# Patient Record
Sex: Female | Born: 1940 | Race: White | Hispanic: No | Marital: Married | Smoking: Never smoker
Health system: Southern US, Community
[De-identification: ages and names within clinical notes are randomized; demographics above are authoritative.]

## PROBLEM LIST (undated history)

## (undated) DIAGNOSIS — Z9889 Other specified postprocedural states: Secondary | ICD-10-CM

## (undated) DIAGNOSIS — I2510 Atherosclerotic heart disease of native coronary artery without angina pectoris: Secondary | ICD-10-CM

## (undated) DIAGNOSIS — I10 Essential (primary) hypertension: Secondary | ICD-10-CM

## (undated) DIAGNOSIS — C801 Malignant (primary) neoplasm, unspecified: Secondary | ICD-10-CM

## (undated) DIAGNOSIS — L280 Lichen simplex chronicus: Secondary | ICD-10-CM

## (undated) DIAGNOSIS — R05 Cough: Secondary | ICD-10-CM

## (undated) DIAGNOSIS — H269 Unspecified cataract: Secondary | ICD-10-CM

## (undated) DIAGNOSIS — K589 Irritable bowel syndrome without diarrhea: Secondary | ICD-10-CM

## (undated) DIAGNOSIS — R011 Cardiac murmur, unspecified: Secondary | ICD-10-CM

## (undated) DIAGNOSIS — R112 Nausea with vomiting, unspecified: Secondary | ICD-10-CM

## (undated) DIAGNOSIS — J301 Allergic rhinitis due to pollen: Secondary | ICD-10-CM

## (undated) DIAGNOSIS — G4730 Sleep apnea, unspecified: Secondary | ICD-10-CM

## (undated) DIAGNOSIS — T8859XA Other complications of anesthesia, initial encounter: Secondary | ICD-10-CM

## (undated) DIAGNOSIS — K219 Gastro-esophageal reflux disease without esophagitis: Secondary | ICD-10-CM

## (undated) DIAGNOSIS — T7840XA Allergy, unspecified, initial encounter: Secondary | ICD-10-CM

## (undated) DIAGNOSIS — F419 Anxiety disorder, unspecified: Secondary | ICD-10-CM

## (undated) DIAGNOSIS — T4145XA Adverse effect of unspecified anesthetic, initial encounter: Secondary | ICD-10-CM

## (undated) DIAGNOSIS — R059 Cough, unspecified: Secondary | ICD-10-CM

## (undated) HISTORY — DX: Allergic rhinitis due to pollen: J30.1

## (undated) HISTORY — DX: Cough, unspecified: R05.9

## (undated) HISTORY — PX: TONSILLECTOMY: SUR1361

## (undated) HISTORY — DX: Sleep apnea, unspecified: G47.30

## (undated) HISTORY — DX: Gastro-esophageal reflux disease without esophagitis: K21.9

## (undated) HISTORY — PX: BLADDER SUSPENSION: SHX72

## (undated) HISTORY — DX: Anxiety disorder, unspecified: F41.9

## (undated) HISTORY — PX: CATARACT EXTRACTION: SUR2

## (undated) HISTORY — DX: Irritable bowel syndrome, unspecified: K58.9

## (undated) HISTORY — DX: Allergy, unspecified, initial encounter: T78.40XA

## (undated) HISTORY — DX: Lichen simplex chronicus: L28.0

## (undated) HISTORY — DX: Unspecified cataract: H26.9

## (undated) HISTORY — PX: EYE SURGERY: SHX253

---
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
## 1968-12-25 HISTORY — PX: NASAL SINUS SURGERY: SHX719

## 1986-12-25 HISTORY — PX: ABDOMINAL HYSTERECTOMY: SHX81

## 1998-06-04 ENCOUNTER — Ambulatory Visit (HOSPITAL_COMMUNITY): Admission: RE | Admit: 1998-06-04 | Discharge: 1998-06-04 | Payer: Self-pay | Admitting: *Deleted

## 1998-07-14 ENCOUNTER — Other Ambulatory Visit: Admission: RE | Admit: 1998-07-14 | Discharge: 1998-07-14 | Payer: Self-pay | Admitting: *Deleted

## 1999-07-11 ENCOUNTER — Ambulatory Visit (HOSPITAL_COMMUNITY): Admission: RE | Admit: 1999-07-11 | Discharge: 1999-07-11 | Payer: Self-pay | Admitting: *Deleted

## 2000-02-13 ENCOUNTER — Ambulatory Visit (HOSPITAL_COMMUNITY): Admission: RE | Admit: 2000-02-13 | Discharge: 2000-02-13 | Payer: Self-pay | Admitting: General Surgery

## 2000-02-13 ENCOUNTER — Encounter: Payer: Self-pay | Admitting: General Surgery

## 2000-02-22 ENCOUNTER — Encounter: Payer: Self-pay | Admitting: General Surgery

## 2000-02-22 ENCOUNTER — Ambulatory Visit (HOSPITAL_COMMUNITY): Admission: RE | Admit: 2000-02-22 | Discharge: 2000-02-22 | Payer: Self-pay | Admitting: General Surgery

## 2000-08-06 ENCOUNTER — Encounter: Payer: Self-pay | Admitting: *Deleted

## 2000-08-06 ENCOUNTER — Ambulatory Visit (HOSPITAL_COMMUNITY): Admission: RE | Admit: 2000-08-06 | Discharge: 2000-08-06 | Payer: Self-pay | Admitting: *Deleted

## 2001-08-12 ENCOUNTER — Encounter: Payer: Self-pay | Admitting: *Deleted

## 2001-08-12 ENCOUNTER — Ambulatory Visit (HOSPITAL_COMMUNITY): Admission: RE | Admit: 2001-08-12 | Discharge: 2001-08-12 | Payer: Self-pay | Admitting: *Deleted

## 2001-08-28 ENCOUNTER — Other Ambulatory Visit: Admission: RE | Admit: 2001-08-28 | Discharge: 2001-08-28 | Payer: Self-pay | Admitting: *Deleted

## 2002-02-10 ENCOUNTER — Encounter: Payer: Self-pay | Admitting: Internal Medicine

## 2002-02-10 ENCOUNTER — Ambulatory Visit (HOSPITAL_COMMUNITY): Admission: RE | Admit: 2002-02-10 | Discharge: 2002-02-10 | Payer: Self-pay | Admitting: Internal Medicine

## 2002-09-01 ENCOUNTER — Ambulatory Visit (HOSPITAL_COMMUNITY): Admission: RE | Admit: 2002-09-01 | Discharge: 2002-09-01 | Payer: Self-pay | Admitting: *Deleted

## 2002-09-01 ENCOUNTER — Encounter: Payer: Self-pay | Admitting: *Deleted

## 2003-06-25 ENCOUNTER — Encounter: Payer: Self-pay | Admitting: Internal Medicine

## 2003-06-26 ENCOUNTER — Encounter: Payer: Self-pay | Admitting: Internal Medicine

## 2003-06-26 LAB — HM COLONOSCOPY

## 2003-08-12 ENCOUNTER — Encounter: Payer: Self-pay | Admitting: Internal Medicine

## 2003-09-14 ENCOUNTER — Encounter: Payer: Self-pay | Admitting: *Deleted

## 2003-09-14 ENCOUNTER — Ambulatory Visit (HOSPITAL_COMMUNITY): Admission: RE | Admit: 2003-09-14 | Discharge: 2003-09-14 | Payer: Self-pay | Admitting: *Deleted

## 2004-09-23 ENCOUNTER — Ambulatory Visit (HOSPITAL_COMMUNITY): Admission: RE | Admit: 2004-09-23 | Discharge: 2004-09-23 | Payer: Self-pay | Admitting: *Deleted

## 2004-09-24 DIAGNOSIS — L280 Lichen simplex chronicus: Secondary | ICD-10-CM

## 2004-09-24 HISTORY — DX: Lichen simplex chronicus: L28.0

## 2005-01-26 LAB — HM PAP SMEAR

## 2005-09-25 ENCOUNTER — Ambulatory Visit (HOSPITAL_COMMUNITY): Admission: RE | Admit: 2005-09-25 | Discharge: 2005-09-25 | Payer: Self-pay | Admitting: *Deleted

## 2005-10-02 ENCOUNTER — Other Ambulatory Visit: Admission: RE | Admit: 2005-10-02 | Discharge: 2005-10-02 | Payer: Self-pay | Admitting: Obstetrics and Gynecology

## 2006-09-27 ENCOUNTER — Ambulatory Visit (HOSPITAL_COMMUNITY): Admission: RE | Admit: 2006-09-27 | Discharge: 2006-09-27 | Payer: Self-pay | Admitting: Obstetrics and Gynecology

## 2007-10-03 ENCOUNTER — Encounter: Payer: Self-pay | Admitting: Internal Medicine

## 2007-10-03 ENCOUNTER — Ambulatory Visit (HOSPITAL_COMMUNITY): Admission: RE | Admit: 2007-10-03 | Discharge: 2007-10-03 | Payer: Self-pay | Admitting: Obstetrics and Gynecology

## 2007-10-03 LAB — HM MAMMOGRAPHY: HM Mammogram: NORMAL

## 2007-10-14 ENCOUNTER — Encounter: Payer: Self-pay | Admitting: Internal Medicine

## 2007-10-21 ENCOUNTER — Encounter: Payer: Self-pay | Admitting: Internal Medicine

## 2007-10-22 ENCOUNTER — Ambulatory Visit: Payer: Self-pay | Admitting: Internal Medicine

## 2007-10-22 LAB — CONVERTED CEMR LAB
Basophils Absolute: 0 10*3/uL (ref 0.0–0.1)
Bilirubin Urine: NEGATIVE
Eosinophils Absolute: 0 10*3/uL (ref 0.0–0.6)
Eosinophils Relative: 0.3 % (ref 0.0–5.0)
HCT: 35.2 % — ABNORMAL LOW (ref 36.0–46.0)
Hemoglobin: 12.8 g/dL (ref 12.0–15.0)
Lymphocytes Relative: 10.9 % — ABNORMAL LOW (ref 12.0–46.0)
MCHC: 36.3 g/dL (ref 30.0–36.0)
MCV: 94.7 fL (ref 78.0–100.0)
Monocytes Absolute: 0.7 10*3/uL (ref 0.2–0.7)
Mucus, UA: NEGATIVE
Neutro Abs: 9.1 10*3/uL — ABNORMAL HIGH (ref 1.4–7.7)
Neutrophils Relative %: 82.1 % — ABNORMAL HIGH (ref 43.0–77.0)
Nitrite: NEGATIVE
Total Protein, Urine: NEGATIVE mg/dL
WBC: 11 10*3/uL — ABNORMAL HIGH (ref 4.5–10.5)
pH: 5.5 (ref 5.0–8.0)

## 2007-11-18 ENCOUNTER — Ambulatory Visit: Payer: Self-pay | Admitting: Internal Medicine

## 2008-02-03 DIAGNOSIS — M6530 Trigger finger, unspecified finger: Secondary | ICD-10-CM | POA: Insufficient documentation

## 2008-02-03 DIAGNOSIS — K589 Irritable bowel syndrome without diarrhea: Secondary | ICD-10-CM | POA: Insufficient documentation

## 2008-02-03 DIAGNOSIS — R1032 Left lower quadrant pain: Secondary | ICD-10-CM

## 2008-02-03 DIAGNOSIS — B009 Herpesviral infection, unspecified: Secondary | ICD-10-CM | POA: Insufficient documentation

## 2008-02-03 DIAGNOSIS — K5730 Diverticulosis of large intestine without perforation or abscess without bleeding: Secondary | ICD-10-CM | POA: Insufficient documentation

## 2008-02-03 DIAGNOSIS — J301 Allergic rhinitis due to pollen: Secondary | ICD-10-CM | POA: Insufficient documentation

## 2008-02-03 DIAGNOSIS — K5732 Diverticulitis of large intestine without perforation or abscess without bleeding: Secondary | ICD-10-CM | POA: Insufficient documentation

## 2008-02-26 ENCOUNTER — Ambulatory Visit: Payer: Self-pay | Admitting: Internal Medicine

## 2008-02-26 LAB — CONVERTED CEMR LAB
ALT: 27 units/L (ref 0–35)
AST: 30 units/L (ref 0–37)
Alkaline Phosphatase: 38 units/L — ABNORMAL LOW (ref 39–117)
BUN: 16 mg/dL (ref 6–23)
Basophils Relative: 0.6 % (ref 0.0–1.0)
Bilirubin Urine: NEGATIVE
CO2: 30 meq/L (ref 19–32)
Calcium: 9.5 mg/dL (ref 8.4–10.5)
Chloride: 107 meq/L (ref 96–112)
Creatinine, Ser: 0.7 mg/dL (ref 0.4–1.2)
Crystals: NEGATIVE
Eosinophils Relative: 4.3 % (ref 0.0–5.0)
GFR calc Af Amer: 108 mL/min
HDL: 52.8 mg/dL (ref >39.0)
Monocytes Relative: 9.2 % (ref 3.0–11.0)
Nitrite: NEGATIVE
Platelets: 179 10*3/uL (ref 150–400)
RBC / HPF: NONE SEEN
RBC: 4.61 M/uL (ref 3.87–5.11)
RDW: 13.2 % (ref 11.5–14.6)
Specific Gravity, Urine: 1.01 (ref 1.000–1.03)
Total Protein, Urine: NEGATIVE mg/dL
Total Protein: 6.5 g/dL (ref 6.0–8.3)
Triglycerides: 61 mg/dL (ref 0–149)
Urine Glucose: NEGATIVE mg/dL
VLDL: 12 mg/dL (ref 0–40)
WBC: 4.1 10*3/uL — ABNORMAL LOW (ref 4.5–10.5)
pH: 7 (ref 5.0–8.0)

## 2008-02-28 ENCOUNTER — Ambulatory Visit: Payer: Self-pay | Admitting: Internal Medicine

## 2008-02-28 LAB — CONVERTED CEMR LAB

## 2008-10-08 ENCOUNTER — Ambulatory Visit (HOSPITAL_COMMUNITY): Admission: RE | Admit: 2008-10-08 | Discharge: 2008-10-08 | Payer: Self-pay | Admitting: Obstetrics and Gynecology

## 2009-01-07 ENCOUNTER — Encounter: Payer: Self-pay | Admitting: Internal Medicine

## 2009-01-08 ENCOUNTER — Encounter: Payer: Self-pay | Admitting: Internal Medicine

## 2009-01-11 ENCOUNTER — Telehealth: Payer: Self-pay | Admitting: Internal Medicine

## 2009-02-25 ENCOUNTER — Emergency Department (HOSPITAL_COMMUNITY): Admission: EM | Admit: 2009-02-25 | Discharge: 2009-02-25 | Payer: Self-pay | Admitting: Family Medicine

## 2009-03-04 ENCOUNTER — Emergency Department (HOSPITAL_COMMUNITY): Admission: EM | Admit: 2009-03-04 | Discharge: 2009-03-04 | Payer: Self-pay | Admitting: Emergency Medicine

## 2009-06-07 ENCOUNTER — Telehealth: Payer: Self-pay | Admitting: Internal Medicine

## 2009-10-11 ENCOUNTER — Ambulatory Visit (HOSPITAL_COMMUNITY): Admission: RE | Admit: 2009-10-11 | Discharge: 2009-10-11 | Payer: Self-pay | Admitting: Obstetrics and Gynecology

## 2010-02-01 ENCOUNTER — Ambulatory Visit: Payer: Self-pay | Admitting: Internal Medicine

## 2010-02-01 DIAGNOSIS — Z9189 Other specified personal risk factors, not elsewhere classified: Secondary | ICD-10-CM

## 2010-02-02 ENCOUNTER — Ambulatory Visit: Payer: Self-pay | Admitting: Cardiology

## 2010-10-14 ENCOUNTER — Ambulatory Visit (HOSPITAL_COMMUNITY): Admission: RE | Admit: 2010-10-14 | Discharge: 2010-10-14 | Payer: Self-pay | Admitting: Obstetrics and Gynecology

## 2010-10-17 ENCOUNTER — Ambulatory Visit: Payer: Self-pay | Admitting: Internal Medicine

## 2010-10-19 ENCOUNTER — Telehealth: Payer: Self-pay | Admitting: Internal Medicine

## 2010-10-23 ENCOUNTER — Encounter: Payer: Self-pay | Admitting: Internal Medicine

## 2010-10-24 ENCOUNTER — Encounter: Payer: Self-pay | Admitting: Internal Medicine

## 2010-10-25 ENCOUNTER — Telehealth: Payer: Self-pay | Admitting: Internal Medicine

## 2010-10-31 ENCOUNTER — Ambulatory Visit: Payer: Self-pay | Admitting: Gastroenterology

## 2010-10-31 ENCOUNTER — Telehealth: Payer: Self-pay | Admitting: Internal Medicine

## 2010-10-31 DIAGNOSIS — A0472 Enterocolitis due to Clostridium difficile, not specified as recurrent: Secondary | ICD-10-CM

## 2010-11-03 ENCOUNTER — Telehealth: Payer: Self-pay | Admitting: Internal Medicine

## 2010-11-07 ENCOUNTER — Telehealth: Payer: Self-pay | Admitting: Internal Medicine

## 2010-11-10 ENCOUNTER — Telehealth (INDEPENDENT_AMBULATORY_CARE_PROVIDER_SITE_OTHER): Payer: Self-pay | Admitting: *Deleted

## 2010-11-23 ENCOUNTER — Ambulatory Visit: Payer: Self-pay | Admitting: Internal Medicine

## 2010-12-05 ENCOUNTER — Encounter: Payer: Self-pay | Admitting: Internal Medicine

## 2011-01-26 NOTE — Consult Note (Signed)
Summary: [facility]   Imported By: Sherian Rein 02/02/2010 11:12:58  _____________________________________________________________________  External Attachment:    Type:   Image     Comment:   External Document

## 2011-01-26 NOTE — Progress Notes (Signed)
Summary: Triage / Questions  Phone Note Call from Patient Call back at [phone]   Caller: Patient Call For: Dr. Marina Goodell Reason for Call: Talk to Nurse Summary of Call: Is on vacation and been dx w/C-Diff and wants to discuss Initial call taken by: Karna Christmas,  October 25, 2010 3:30 PM  Follow-up for Phone Call        Pt is on vacation and has been diagnosed with C diff,   she was put on cipro and flagyl on Sunday.  The diarrhea has gotten better but she still feels like she doesnt have any appetite.  She also has started to take Align daily.  Her question is should she come home from vacation or can she stay?   I advised her she is contagious and  she does need to wash her hands thouroughly.  I also told her unless she got worse or just felt like she wanted to be home she did not need to come back emergently.  I told her I would forward to Dr Marina Goodell for further recommendations or orders.  She agreed. Follow-up by: Chales Abrahams CMA Duncan Dull),  October 25, 2010 4:42 PM  Additional Follow-up for Phone Call Additional follow up Details #1::        Where is she? Who diagnosed her? Can we get those records. I can not advise further without appropriate / complete information. It looks like she called Dr Debby Bud recently for same. Why is she calling here as well??? Additional Follow-up by: Hilarie Fredrickson MD,  October 25, 2010 5:05 PM    Additional Follow-up for Phone Call Additional follow up Details #2::    [facility]  did the testing and she will have them fax the records to [name].  She says she is feeling better today and having normal bowel movements.  She did call Dr Debby Bud but wanted Dr Marina Goodell to be aware of what was going on as well.  Dr Marina Goodell I will put the info on your desk when it arrives. Follow-up by: Chales Abrahams CMA Duncan Dull),  October 26, 2010 8:43 AM  Additional Follow-up for Phone Call Additional follow up Details #3:: Details for Additional Follow-up Action Taken: I'm aware. Call  back for problems Additional Follow-up by: Hilarie Fredrickson MD,  October 26, 2010 9:48 AM

## 2011-01-26 NOTE — Assessment & Plan Note (Signed)
Summary: lt sided abd. palapable lump   History of Present Illness Visit Type: Follow-up Visit Primary GI MD: Yancey Flemings MD Primary Provider: Illene Regulus, MD Requesting Provider: n/a Chief Complaint: Left side pain  History of Present Illness:   70 year old female with a history of diverticulitis who presents today regarding possible left lower quadrant abdominal mass. The current history dates back to December 23, 2009 when she noticed some vaginal bleeding. She was noticed to have a possible left lower quadrant mass at the time of the pelvic exam. The vaginal bleeding was felt secondary to an ulcer from her pessary. Pessary was removed and the vaginal symptoms resolved. Followup examination continue to raise concern regarding left lower quadrant mass. Pelvic ultrasound was negative (outside report reviewed). She is now referred. Patient reports that she had a difficult bowel movement with lower abdominal pain. She has used MiraLax which has worked well. She denies rectal bleeding. Her last colonoscopy was performed July of 2004. The preparation was excellent. Severe pandiverticulosis noted. No polyps.   GI Review of Systems      Denies abdominal pain, acid reflux, belching, bloating, chest pain, dysphagia with liquids, dysphagia with solids, heartburn, loss of appetite, nausea, vomiting, vomiting blood, weight loss, and  weight gain.      Reports constipation and  diverticulosis.     Denies anal fissure, black tarry stools, change in bowel habit, diarrhea, fecal incontinence, heme positive stool, hemorrhoids, irritable bowel syndrome, jaundice, light color stool, liver problems, rectal bleeding, and  rectal pain.    Current Medications (verified): 1)  Estrace 0.1 Mg/gm  Crea (Estradiol) .Marland Kitchen.. 1 Gm Per Vag Twice A Week 2)  Multivitamins   Tabs (Multiple Vitamin) .... Take 1 Tablet By Mouth Once A Day 3)  Calcium 600 Mg  Tabs (Calcium) .... Take 2 Tablet By Mouth Once A Day 4)  Vitamin D  1000 Unit  Tabs (Cholecalciferol) .... Take 1 Tablet By Mouth Once A Day  Allergies (verified): No Known Drug Allergies  Past History:  Past Medical History: Reviewed history from 02/28/2008 and no changes required. 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 Devoria Glassing. Rema Jasmine, [place]          GS - Dr. Kendrick Ranch          [facility] - Dr. Sherryle Lis  Past Surgical History: Reviewed history from 02/28/2008 and no changes required. Hysterectomy-vag Tonsillectomy Sinus surgery A-P bladder suspension with Hyst.   G2P2  Family History: Reviewed history from 02/28/2008 and no changes required. father - deceased @ 101: lung cancer mother - deceased @ 15: emphysema 2 brothers - both deceased: one with CAD/M, Breast cancerI; one with Lung cancer Neg- colon, , DM, HTN  Social History: Reviewed history from 02/28/2008 and no changes required. 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  Review of Systems       entire non- GI review of systems is negative  Vital Signs:  Patient profile:   70 year old female Height:      63 inches Weight:      141 pounds BMI:  25.07 BSA:     1.67 Pulse rate:   76 / minute Pulse rhythm:   regular BP sitting:   126 / 84  (left arm) Cuff size:   regular  Vitals Entered By: Ok Anis CMA (February 01, 2010 2:46 PM)  Physical Exam  General:  Well developed, well nourished, no acute distress. Head:  Normocephalic and atraumatic. Eyes:  PERRLA, no icterus. Ears:  Normal auditory acuity. Nose:  No deformity, discharge,  or lesions. Mouth:  No  deformity or lesions, dentition normal. Neck:  Supple; no masses or thyromegaly. Lungs:  Clear throughout to auscultation. Heart:  Regular rate and rhythm; no murmurs, rubs,  or bruits. Abdomen:  soft, nontender, nondistended with good bowel sounds. No organomegaly. There is palpable fullness in the left lower quadrant.  Msk:  Symmetrical with no gross deformities. Normal posture. Pulses:  Normal pulses noted. Extremities:  No clubbing, cyanosis, edema or deformities noted. Neurologic:  Alert and  oriented x4;  grossly normal neurologically. Skin:  Intact without significant lesions or rashes. Psych:  Alert and cooperative. Normal mood and affect.   Impression & Recommendations:  Problem # 1:  ABDOMINAL PAIN, LEFT LOWER QUADRANT transient lower abdominal discomfort and question of left lower quadrant mass on physical exam. Some fullness today, which may represent stool. However, more significant abnormality should be excluded.  Plan: #1. CT Scan of the abdomen and pelvis #2. If CT negative, then continue MiraLax to achieve daily bowel movements.  Problem # 2:  DIVERTICULOSIS, COLON (ICD-562.10) Assessment: Comment Only  Problem # 3:  CONSTIPATION (564.0) titrate MiraLax daily to achieve daily bowel movements.  Other Orders: CT Abdomen/Pelvis with Contrast (CT Abd/Pelvis w/con) TLB-BUN (Urea Nitrogen) (84520-BUN) TLB-Creatinine, Blood (82565-CREA)  Patient Instructions: 1)  You will get labs done today. 2)  You have been scheduled for a CT scan at [facility] CT for 02/02/10. 3)  The medication list was reviewed and reconciled.  All changed / newly prescribed medications were explained.  A complete medication list was provided to the patient / caregiver. 4)  Copy: Dr. Stefanie Libel, Dr. Illene Regulus

## 2011-01-26 NOTE — Assessment & Plan Note (Signed)
Summary: LOW  GRADE FEVER  SINUS PROBLEM DIARRHEA STOMACH PAIN  STC   Vital Signs:  Patient profile:   70 year old female Height:      63 inches Weight:      139 pounds BMI:     24.71 O2 Sat:      98 % on Room air Temp:     98.5 degrees F oral Pulse rate:   79 / minute BP sitting:   122 / 68  (left arm) Cuff size:   regular  Vitals Entered By: Bill Salinas CMA (October 17, 2010 4:25 PM)  O2 Flow:  Room air CC: pt here for evaluation of low grade fever, sinus' and diarrhea/ ab   Primary Care Provider:  Illene Regulus, MD  CC:  pt here for evaluation of low grade fever and sinus' and diarrhea/ ab.  History of Present Illness: 3 weeks ago felt flu-like, was having night sweats and felt weak. 10 days ago she developed a sore throat and went to Urgent Care in [place] was diagnosed with sinus infection  and was given cefdinir 300mg  two times a day x 10 days. She subsequently developed "pus-pockets" back of throat, still had intermittent fevers to 102 F despite being on antibioticsw. She is now having abdominal pain and diarrhea since starting antibiotics. She has not had any abdominal tenderness on self exam  to suggest recurrent diverticulitis.    Current Medications (verified): 1)  Estrace 0.1 Mg/gm  Crea (Estradiol) .Marland Kitchen.. 1 Gm Per Vag Twice A Week 2)  Multivitamins   Tabs (Multiple Vitamin) .... Take 1 Tablet By Mouth Once A Day 3)  Calcium 600 Mg  Tabs (Calcium) .... Take 2 Tablet By Mouth Once A Day 4)  Vitamin D 1000 Unit  Tabs (Cholecalciferol) .... 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 Devoria Glassing. Rema Jasmine, [place]          GS - Dr. Kendrick Ranch          [facility] - Dr. Sherryle Lis  Past Surgical History: Last updated: 13-Mar-2008 Hysterectomy-vag Tonsillectomy Sinus surgery A-P bladder suspension with Hyst.   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  Review of Systems       The patient complains of anorexia, fever, weight loss, hoarseness, and abdominal pain.  The patient denies decreased hearing, chest pain, syncope, dyspnea on exertion, prolonged cough, severe indigestion/heartburn, muscle weakness, depression, abnormal bleeding, and enlarged lymph nodes.    Physical Exam  General:  Well-developed,well-nourished,in no acute distress; alert,appropriate and cooperative throughout examination Head:  Normocephalic and atraumatic without obvious abnormalities. No apparent alopecia or balding. No tenderness to percussion over the facial sinuss Eyes:  vision grossly intact, pupils equal,  pupils round, and corneas and lenses clear.   Mouth:  posterior pharynx is clear Lungs:  normal respiratory effort and normal breath sounds.   Abdomen:  soft, normal bowel sounds, no guarding, and no rigidity.  Tender left lower quadrant to deep palpation. Msk:  normal ROM, no joint tenderness, and no joint swelling.   Pulses:  2+ Skin:  turgor normal and color normal.   Psych:  Oriented X3 and memory intact for recent and remote.     Impression & Recommendations:  Problem # 1:  DIVERTICULITIS, COLON (ICD-562.11) Patient with no evidence of sinus infection. she  does have LLQ tenderness, change in bowel habit, cramping abdominal pain, low grade fevers. Suspect recurrent diverticulitis incompletely treated with cefdinir.  Plan - Augmentin 875 mg two times a day x 7 days.           hydration           call for any worsening of symptoms.   Complete Medication List: 1)  Estrace 0.1 Mg/gm Crea (Estradiol) .Marland Kitchen.. 1 gm per vag twice a week 2)  Multivitamins Tabs (Multiple vitamin) .... Take 1 tablet by mouth once a day 3)  Calcium 600 Mg Tabs (Calcium) .... Take 2 tablet by mouth once a day 4)  Vitamin D 1000 Unit Tabs (Cholecalciferol) .... Take 1 tablet by mouth once a day 5)  Amoxicillin-pot Clavulanate 875-125 Mg Tabs (Amoxicillin-pot clavulanate) .Marland Kitchen.. 1 by mouth two times a day x 7 fo rdiverticulitis  Patient Instructions: 1)  fevers and GI symptoms - tender in the left lower quadrant with persistent fevers despite antibiotics. No evidence of a sinus infection. Plan - stop cefdinir. Start Amox/clav two times a day x 7 days for diverticulitis. Be sure to drink a glass of beverage (mixed salt solution like gator aide) for each loose stool. Call if your symptoms, especially fever, are not improved in 48 hours.  Prescriptions: AMOXICILLIN-POT CLAVULANATE 875-125 MG TABS (AMOXICILLIN-POT CLAVULANATE) 1 by mouth two times a day x 7 fo rdiverticulitis  #14 x 1   Entered and Authorized by:   Jacques Navy MD   Signed by:   Jacques Navy MD on 10/18/2010   Method used:   [facility] to        [facility]  [address] [id number]       [address]       [place], [place]  [address]       Ph: [phone] or [phone]       Fax: [phone]   RxID:   [id number]    Orders Added: 1)  Est. Patient Level IV [42595]

## 2011-01-26 NOTE — Assessment & Plan Note (Signed)
Summary: Followup C. difficile infection   History of Present Illness Visit Type: Follow-up Visit Primary GI MD: Yancey Flemings MD Primary Provider: Illene Regulus, MD Requesting Provider: n/a Chief Complaint: C. Diff follow up, pt is doing well and has no complaints History of Present Illness:   70 year old female who was recently seen for H. pylori infection. This after several courses of antibiotics. She was treated with metronidazole with improvement. Subsequent mild recurrent flare for which he was treated with metronidazole for 2 weeks. Discontinued 6 days ago. Has had normal bowel movements for about 2 weeks. No abdominal complaints or other issues. Last colonoscopy in 2004 revealing diverticulosis.   GI Review of Systems      Denies abdominal pain, acid reflux, belching, bloating, chest pain, dysphagia with liquids, dysphagia with solids, heartburn, loss of appetite, nausea, vomiting, vomiting blood, weight loss, and  weight gain.      Reports diverticulosis.     Denies anal fissure, black tarry stools, change in bowel habit, constipation, diarrhea, fecal incontinence, heme positive stool, hemorrhoids, irritable bowel syndrome, jaundice, light color stool, liver problems, rectal bleeding, and  rectal pain.    Current Medications (verified): 1)  Estrace 0.1 Mg/gm  Crea (Estradiol) .Marland Kitchen.. 1 Gm Per Vag Twice A Week 2)  Multivitamins   Tabs (Multiple Vitamin) .... Take 1 Tablet By Mouth Once A Day 3)  Calcium 600 Mg  Tabs (Calcium) .... Take 2 Tablet By Mouth Once A Day 4)  Vitamin D 1000 Unit  Tabs (Cholecalciferol) .... Take 1 Tablet By Mouth Once A Day  Allergies (verified): 1)  ! Codeine  Past History:  Past Medical History: Reviewed history from 10/31/2010 and no changes required. 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 Devoria Glassing. Rema Jasmine, [place]          [facility] - Dr. Kendrick Ranch          [facility] - Dr. Lilyan Punt diff  Past Surgical History: Reviewed history from 02/28/2008 and no changes required. Hysterectomy-vag Tonsillectomy Sinus surgery A-P bladder suspension with Hyst.   G2P2  Family History: Reviewed history from 02/28/2008 and no changes required. father - deceased @ 2: lung cancer mother - deceased @ 27: emphysema 2 brothers - both deceased: one with CAD/M, Breast cancerI; one with Lung cancer Neg- colon, , DM, HTN  Social History: Reviewed history from 02/28/2008 and no changes required. 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  Review of Systems  The patient denies allergy/sinus, anemia, anxiety-new, arthritis/joint pain, back pain, blood in urine, breast changes/lumps, confusion, cough, coughing up blood, depression-new, fainting, fatigue, fever, headaches-new, hearing problems, heart murmur, heart rhythm changes, itching, menstrual pain, muscle pains/cramps, night sweats, nosebleeds, pregnancy symptoms, shortness of breath, skin rash, sleeping problems, sore throat, swelling of feet/legs, swollen lymph glands, thirst - excessive, urination - excessive, urination changes/pain, urine leakage, vision changes, and voice change.    Vital Signs:  Patient profile:   70 year old female Height:      63 inches Weight:      134 pounds BMI:  23.82 Pulse rate:   88 / minute Pulse rhythm:   regular BP sitting:   118 / 68  (left arm) Cuff size:   regular  Vitals Entered By: Francee Piccolo CMA Duncan Dull) (November 23, 2010 3:00 PM)  Physical Exam  General:  Well developed, well nourished, no acute distress. Head:  Normocephalic and  atraumatic. Eyes:  PERRLA, no icterus. Mouth:  No deformity or lesions, dentition normal. Lungs:  Clear throughout to auscultation. Heart:  Regular rate and rhythm; no murmurs, rubs,  or bruits. Abdomen:  Soft, nontender and nondistended. No masses, hepatosplenomegaly or hernias noted. Normal bowel sounds. Pulses:  Normal pulses noted. Neurologic:  Alert and  oriented x4; . Skin:  Intact without significant lesions or rashes. Psych:  Alert and cooperative. Normal mood and affect.   Impression & Recommendations:  Problem # 1:  CLOSTRIDIUM DIFFICILE COLITIS (ICD-008.45) result after a second course of metronidazole. Currently feeling well.  Plan #1. Continue probiotic Align for 4 weeks. Additional samples provided  #2. Advise regarding relapse rate of approximately 25%. She knows to contact the office for recurrent symptoms  Problem # 2:  SCREENING COLORECTAL-CANCER (ICD-V76.51) up-to-date. Due for followup around July 2014. Recall made  Patient Instructions: 1)  Samples of Align given to patient to take 1 by mouth once daily 2)  We will schedule your colon recall letter to be sent in 06/2013, 3)  Copy sent to : Illene Regulus, MD 4)  The medication list was reviewed and reconciled.  All changed / newly prescribed medications were explained.  A complete medication list was provided to the patient / caregiver.

## 2011-01-26 NOTE — Progress Notes (Signed)
Summary: Triage / C.Diff  Phone Note Call from Patient Call back at Home Phone [phone] Call back at [phone] After 11:45   Caller: Patient Call For: Dr. Marina Goodell Reason for Call: Talk to Nurse Summary of Call: Having problems w/her C-Diff Initial call taken by: Karna Christmas,  November 07, 2010 8:32 AM  Follow-up for Phone Call        Yesterday after breakfast had lower abd. pain that was a level  8  which preceded 3 loose brown stools.Mild discomfort right after lunch but no further stools.Had temp. of 99 last p.m. and it is 98.8 this a.m.Had one tiny formed brown stool this a.m. and no further pain.Asking if Dr.Perry feels she needs more antiibiotics for the C.Diff. or should she just expect some  minor symptoms? Follow-up by: Teryl Lucy RN,  November 07, 2010 9:07 AM  Additional Follow-up for Phone Call Additional follow up Details #1::        Metronidazole 250mg  qid x 10 days.  Office follow up in 2 weeks Additional Follow-up by: Hilarie Fredrickson MD,  November 07, 2010 9:43 AM    Additional Follow-up for Phone Call Additional follow up Details #2::    Pt. ntfd. to pick up rx. and given r.o.v. appt. fot 11/23/10. Follow-up by: Teryl Lucy RN,  November 07, 2010 10:34 AM  New/Updated Medications: FLAGYL 250 MG TABS (METRONIDAZOLE) Take 1 p.o. q.i.d. for 10 days Prescriptions: FLAGYL 250 MG TABS (METRONIDAZOLE) Take 1 p.o. q.i.d. for 10 days  #40 x 0   Entered by:   Teryl Lucy RN   Authorized by:   Hilarie Fredrickson MD   Signed by:   Teryl Lucy RN on 11/07/2010   Method used:   [facility]  [address] [id number]       [address]       [place], [place]  [address]       Ph: [phone] or [phone]       Fax: [phone]   RxID:   [id number]

## 2011-01-26 NOTE — Progress Notes (Signed)
Summary: triage / Wants GI appt for recent Dx C.diff  Phone Note Call from Patient Call back at Home Phone [phone]   Caller: Patient Call For: Dr. Marina Goodell Reason for Call: Talk to Nurse Summary of Call: pt says she has C Diff and must be seen today or tomorrow Initial call taken by: Vallarie Mare,  October 31, 2010 8:19 AM  Follow-up for Phone Call        Pt. back from vacation and is requesting appt. as she is on tx. for c.diff. diagnosed while she was out of town.  Diarrhea is improving.Having an average of 3 stools daily which occur usually in the a.m. and they are starting to get some form.Ses sm. amt.of mucus but denies seeing blood.Having some cramping after eating which lasts about a half an hour.Prior fever but she hasn't taken it for 3 days as she does not feel feverish.Given appt. with N.P for today. Follow-up by: Teryl Lucy RN,  October 31, 2010 9:08 AM  Additional Follow-up for Phone Call Additional follow up Details #1::        ok Additional Follow-up by: Hilarie Fredrickson MD,  October 31, 2010 11:31 AM

## 2011-01-26 NOTE — Progress Notes (Signed)
Summary: triage / coating on tongue  Phone Note Call from Patient Call back at [phone]   Caller: Patient Call For: Dr. Marina Goodell Reason for Call: Talk to Nurse Summary of Call: yellow coating on tongue and has finished abx for C Diff Initial call taken by: Vallarie Mare,  November 03, 2010 11:45 AM  Follow-up for Phone Call        Patient has some white/yellow coating on her tongue.  Has been on several antibiotics recently.  Her tongue is not sore.  No other complaints.  Dr Marina Goodell please advise. Follow-up by: Darcey Nora RN, CGRN,  November 03, 2010 4:46 PM  Additional Follow-up for Phone Call Additional follow up Details #1::        mycelex troches 5x / day x one week Additional Follow-up by: Hilarie Fredrickson MD,  November 03, 2010 5:01 PM    Additional Follow-up for Phone Call Additional follow up Details #2::    I have left the patient a message with Dr Lamar Sprinkles recommendations.  She is asked to call back with any questions. Follow-up by: Darcey Nora RN, CGRN,  November 04, 2010 9:05 AM  New/Updated Medications: MYCELEX 10 MG TROC (CLOTRIMAZOLE) Dissolve 1 orally 5 times a day for 1 week Prescriptions: MYCELEX 10 MG TROC (CLOTRIMAZOLE) Dissolve 1 orally 5 times a day for 1 week  #35 x 0   Entered by:   Darcey Nora RN, CGRN   Authorized by:   Hilarie Fredrickson MD   Signed by:   Darcey Nora RN, CGRN on 11/04/2010   Method used:   [facility]        [facility]  [address] [id number]       [address]       [place], [place]  [address]       Ph: [phone] or [phone]       Fax: [phone]   RxID:   [id number]

## 2011-01-26 NOTE — Progress Notes (Signed)
Summary: constipation  Phone Note Call from Patient   Summary of Call: Pt. says she is not having  complete evacuation of stool and is asking if she can increase her fiber and go back on Metamucil as she had gyne exam this a.m. and was told by the dr. that rectal vault was full of stool. Initial call taken by: Teryl Lucy RN,  November 10, 2010 4:25 PM  Follow-up for Phone Call        SURE, [place] NOT? Follow-up by: Hilarie Fredrickson MD,  November 10, 2010 4:43 PM  Additional Follow-up for Phone Call Additional follow up Details #1::        Pt. ntfd. Additional Follow-up by: Teryl Lucy RN,  November 10, 2010 4:46 PM

## 2011-01-26 NOTE — Progress Notes (Signed)
Summary: [facility]   Imported By: Sherian Rein 02/02/2010 11:17:23  _____________________________________________________________________  External Attachment:    Type:   Image     Comment:   External Document

## 2011-01-26 NOTE — Assessment & Plan Note (Signed)
Summary: C.diff.-dr.perry pt./cl   History of Present Illness Visit Type: Follow-up Visit Primary GI MD: Yancey Flemings MD Primary Shailynn Fong: Illene Regulus, MD Requesting Nakema Fake: n/a Chief Complaint: f/u c diff History of Present Illness:   Patient is a 70 year old female followed by Dr. Marina Goodell for diverticulosis and constipation. Patient was recently treated with antibiotics for a sinus infection. She subsequently abdominal discomfort and diarrhea. On Oct. 24 patient saw Dr. Debby Bud and was treated with Augmentin for presumed diverticulitis. Patient went on vacation to the [facility] but because diarrhea persisted whe went to ER in [place] on 10/23/10.  I reviewed labs and CBC was okay, potassium slightly low. Patient given Cipro and Flagyl.  Stools was positive for C-Diff but apparently patient was not asked to discontinue Cipro.  Patient is on 7th day  Cipro and Flagyl. She has improved, significantly with only a few stools every morning. Her stools are beginning  to solidify. She has some mild, diffuse abdominal discomfort but that too has significantly improved.    GI Review of Systems    Reports abdominal pain.     Location of  Abdominal pain: lower abdomen.    Denies acid reflux, belching, bloating, chest pain, dysphagia with liquids, dysphagia with solids, heartburn, loss of appetite, nausea, vomiting, vomiting blood, weight loss, and  weight gain.      Reports diarrhea.     Denies anal fissure, black tarry stools, change in bowel habit, constipation, diverticulosis, fecal incontinence, heme positive stool, hemorrhoids, irritable bowel syndrome, jaundice, light color stool, liver problems, rectal bleeding, and  rectal pain.    Current Medications (verified): 1)  Estrace 0.1 Mg/gm  Crea (Estradiol) .Marland Kitchen.. 1 Gm Per Vag Twice A Week 2)  Multivitamins   Tabs (Multiple Vitamin) .... Take 1 Tablet By Mouth Once A Day 3)  Calcium 600 Mg  Tabs (Calcium) .... Take 2 Tablet By Mouth Once A Day 4)   Vitamin D 1000 Unit  Tabs (Cholecalciferol) .... Take 1 Tablet By Mouth Once A Day 5)  Flagyl 500 Mg Tabs (Metronidazole) .... Take 1 Tab 3 Times X 10 Days  Allergies (verified): 1)  ! 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 Devoria Glassing. Rema Jasmine, [place]          [facility] - Dr. Kendrick Ranch          [facility] - Dr. Lilyan Punt diff  Past Surgical History: Reviewed history from 02/28/2008 and no changes required. Hysterectomy-vag Tonsillectomy Sinus surgery A-P bladder suspension with Hyst.   G2P2  Family History: Reviewed history from 02/28/2008 and no changes required. father - deceased @ 69: lung cancer mother - deceased @ 30: emphysema 2 brothers - both deceased: one with CAD/M, Breast cancerI; one with Lung cancer Neg- colon, , DM, HTN  Social History: Reviewed history from 02/28/2008 and no changes required. 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  Review of Systems  The patient denies allergy/sinus, anemia, anxiety-new, arthritis/joint pain, back pain, blood in urine, breast changes/lumps, change in vision, confusion, cough, coughing  up blood, depression-new, fainting, fatigue, fever, headaches-new, hearing problems, heart murmur, heart rhythm changes, itching, menstrual pain, muscle pains/cramps, night sweats, nosebleeds, pregnancy symptoms, shortness of breath, skin rash, sleeping problems, sore throat, swelling of feet/legs, swollen lymph glands, thirst - excessive , urination - excessive , urination changes/pain, urine leakage,  vision changes, and voice change.    Vital Signs:  Patient profile:   70 year old female Height:      63 inches Weight:      133 pounds BMI:     23.65 Pulse rate:   78 / minute Pulse rhythm:   regular BP sitting:   128 / 60  (left arm)  Vitals Entered By: Chales Abrahams CMA Duncan Dull) (October 31, 2010 1:19 PM)  Physical Exam  General:  Well developed, well nourished, no acute distress. Head:  Normocephalic and atraumatic. Eyes:  Conjunctiva pink, no icterus.  Neck:  no obvious masses  Lungs:  Clear throughout to auscultation. Heart:  Regular rate and rhythm; no murmurs, rubs,  or bruits. Abdomen:  Abdomen soft, nontender, nondistended. No obvious masses or hepatomegaly.Normal bowel sounds.  Msk:  Symmetrical with no gross deformities. Normal posture. Extremities:  No palmar erythema, no edema.  Neurologic:  Alert and  oriented x4;  grossly normal neurologically. Skin:  Intact without significant lesions or rashes. Cervical Nodes:  No significant cervical adenopathy. Psych:  Alert and cooperative. Normal mood and affect.   Impression & Recommendations:  Problem # 1:  CLOSTRIDIUM DIFFICILE COLITIS (ICD-008.45) Given Cipro and Flagyl for diarrhea at  emergency department in [facility] 10/24/10. Stool subsequently positive for C-Difficile. Stop Cipro, continue Flagyl for total of 10 days. Patient has significantly improved with 7 days of treatment. Abdominal exam benign, no signs of dehydration, she looks fine. Should she recrudesces, patient will call our office immediately.  Patient Instructions: 1)  We have given you a Low Fiber diet brochure.  2)  We removed the Amoxicillin from your medication list. 3)  Copy sent to : Illene Regulus, Md Prescriptions: FLAGYL 500 MG TABS (METRONIDAZOLE) Take 1 tab 3 times x 10 days  #30 x 0   Entered by:   Lowry Ram NCMA   Authorized by:   Willette Cluster NP   Signed by:   Willette Cluster NP on 10/31/2010   Method used:   Historical    RxID:   [id number]

## 2011-01-26 NOTE — Progress Notes (Signed)
Summary: Diarrhea  Phone Note Call from Patient Call back at [phone]   Summary of Call: Pt c/o continued diarrhea. She has loose stools after everything she eats. No visible blood or mucus. She is keeping a very bland diet and has taken no otc meds. Should she try OTC med?  Initial call taken by: Lamar Sprinkles, CMA,  October 19, 2010 4:45 PM  Follow-up for Phone Call        for diarrhea: 1) try metamucil two times a day, if no improvement in 24-48 hrs  2) otc immodium. 3) for every loose stool 1 6-8oz glass of fluid. Follow-up by: Jacques Navy MD,  October 19, 2010 8:58 PM  Additional Follow-up for Phone Call Additional follow up Details #1::        Pt informed  Additional Follow-up by: Lamar Sprinkles, CMA,  October 20, 2010 9:11 AM

## 2011-05-09 NOTE — Assessment & Plan Note (Signed)
[facility]                         GASTROENTEROLOGY OFFICE NOTE   NAME:Bensinger, SHATONYA PASSON                 MRN:          [id number]  DATE:11/18/2007                            DOB:          1941-11-10    HISTORY:  A 70 year old female, presents today for followup.  She was  evaluated October 22, 2007 for left lower quadrant pain and fever, felt  to represent diverticulitis.  CBC and urinalysis were obtained.  Urinalysis was essentially negative.  CBC revealed white blood cell  count of 11.0, hemoglobin 12.8.  She was treated with ciprofloxacin 500  mg b.i.d. for 10 days which she completed.  Also we discussed dietary  changes.  She presents today for followup.  Patient reports after  completing antibiotics her pain resolved.  Last week however she had  some recurrent discomfort and fever.  Also she has had some constipation  for which she has been using herbal tea.  This works nicely.  She did  have a refill of her Cipro and decided to reinitiate therapy about 4  days ago.  Currently she feels well.  She asks a number of questions  regarding diverticulitis, constipation, use of laxatives and probiotic.   CURRENT MEDICATIONS:  Hormone, multivitamin, calcium, vitamin D and  Cipro.   PHYSICAL EXAMINATION:  Finds a well-appearing female, in no acute  distress.  She is alert and oriented.  Her blood pressure is 120/68,  heart rate is 72, weight is 143.4 pounds, temperature is 97.8 degrees  Fahrenheit.  HEENT:  Sclerae anicteric.  LUNGS:  Clear.  HEART:  Regular.  ABDOMEN:  Soft without any tenderness despite to deep palpation  throughout, good bowel sounds heard, no mass.   IMPRESSION:  Recent bout of diverticulitis.  Possible mild recurrent  flare versus discomfort due to constipation or spasm from known  diverticulosis.  In any event, completely benign exam today.   RECOMMENDATIONS:  1. Complete self initiated second course of Cipro.  2.  Discussed regimen options to keep her bowels regulated.  3. Resume general medical care with Dr. Debby Bud. GI followup p.r.n.     Wilhemina Bonito. Marina Goodell, MD  Electronically Signed    JNP/MedQ  DD: 11/18/2007  DT: 11/18/2007  Job #: [id number]   cc:   Rosalyn Gess. Norins, MD

## 2011-05-09 NOTE — Assessment & Plan Note (Signed)
[facility]                         GASTROENTEROLOGY OFFICE NOTE   NAME:Gloria Werner, Gloria Werner                 MRN:          [id number]  DATE:10/22/2007                            DOB:          08-Jan-1941    HISTORY:  This is a 70 year old white female with no significant past  medical history who presents today for evaluation of abdominal pain and  fever.  The patient was evaluated for chronic abdominal complaints in  July of 2004.  See that dictation for details.  She underwent  colonoscopy June 26, 2003.  She was found to have severe pan  diverticulosis.  In addition to diverticulosis with a prior history of  diverticulitis, she was felt to have irritable bowel.  She was treated  with fiber and Zelnorm.  She followed up in August, and was doing well.  She has not been seen since.  Her current history dates back to several  days ago when she began feeling cold or chilled.  Two days ago she had  lower abdominal discomfort, which has progressed.  She did have a  temperature of 101.3.  No nausea or vomiting.  Her bowels have been a  bit constipated.  She does have a rectocele on cystocele, for which she  had been wearing a pessary.  Routine blood work 8 days ago was  unremarkable with a white blood cell count of 3.9.  She denies urinary  complaints.   PAST MEDICAL HISTORY:  None.   PAST SURGICAL HISTORY:  1. Hysterectomy.  2. Tonsillectomy.   ALLERGIES:  1. MERCURY.  2. CODEINE.   MEDICATIONS:  Hormone replacement.   FAMILY HISTORY:  Mother with colon polyps.   SOCIAL HISTORY:  Patient is married with children.  She works as a  Social research officer, government for [employer].  Does not smoke or use alcohol.   REVIEW OF SYSTEMS:  Per diagnostic evaluation form.   PHYSICAL EXAMINATION:  Well-appearing female in no acute distress.  Blood pressure is 118/74.  Heart rate is 88.  Weight is 144 pounds.  She  is 5 feet 1 inch in height.  HEENT:  Sclerae anicteric.   Conjunctivae are pink.  Oral mucosa intact.  No adenopathy.  LUNGS:  Clear.  HEART:  Regular.  ABDOMEN:  Soft with focal tenderness in the lower quadrant to palpation.  There is no guarding or rebound.  Good bowel sounds heard.   IMPRESSION:  1. Focal left lower quadrant pain and fever consistent with      diverticulitis.  The patient is not toxic.  2. History of constipation predominant irritable bowel.   RECOMMENDATIONS:  1. Obtain CBC and urinalysis.  2. Initiate ciprofloxacin 500 mg p.o. b.i.d. x10 days.  3. Low-residue diet.  4. Office followup in 2 weeks.  She knows to contact the office in the      interim for questions or problems.   CBC from earlier today revealed a mildly elevated white blood cell count  at 11; hemoglobin 12.8.  Urinalysis revealed no significant  abnormalities, though the microscopic portion was pending.     Wilhemina Bonito. Marina Goodell, MD  Electronically Signed  JNP/MedQ  DD: 10/22/2007  DT: 10/23/2007  Job #: [id number]   cc:   Rosalyn Gess. Norins, MD

## 2011-05-18 ENCOUNTER — Encounter: Payer: Self-pay | Admitting: Internal Medicine

## 2011-05-19 ENCOUNTER — Ambulatory Visit (INDEPENDENT_AMBULATORY_CARE_PROVIDER_SITE_OTHER): Payer: 59 | Admitting: Internal Medicine

## 2011-05-19 VITALS — BP 120/64 | HR 80 | Temp 98.0°F | Wt 142.0 lb

## 2011-05-19 DIAGNOSIS — M7989 Other specified soft tissue disorders: Secondary | ICD-10-CM

## 2011-05-22 NOTE — Progress Notes (Signed)
  Subjective:    Patient ID: Gloria Werner, female    DOB: [birth year], 70 y.o.   MRN: [id number]  HPI [name] presents for a persistent swelling of the dorsum of her left hand. She noticed a swelling approximately 1 week ago. Initially it was much more prominent than today. She has had some limitation in range of motion, primarily flexion of the wrist, due to pressure. She denies any trauma, envenomation or previous problem.  PMH, FamHx and SocHx reviewed for any changes and relevance.    Review of Systems Review of Systems  Constitutional:  Negative for fever, chills, activity change and unexpected weight change.  HENT:  Negative for hearing loss, ear pain, congestion, neck stiffness and postnasal drip.   Eyes: Negative for pain, discharge and visual disturbance.  Respiratory: Negative for chest tightness and wheezing.   Cardiovascular: Negative for chest pain and palpitations.       [No decreased exercise tolerance Gastrointestinal: [No change in bowel habit. No bloating or gas. No reflux or indigestion Genitourinary: Negative for urgency, frequency, flank pain and difficulty urinating.  Musculoskeletal: Negative for myalgias, back pain, arthralgias and gait problem.  Neurological: Negative for dizziness, tremors, weakness and headaches.  Hematological: Negative for adenopathy.  Psychiatric/Behavioral: Negative for behavioral problems and dysphoric mood.       Objective:   Physical Exam WNWD white woman in no distress Resp - normal Cor - RRR Ortho - visible swelling over the carpals of the left hand, dorsal aspect. No hard nodule to suggest ganglion cyst. With a wide-area of involvement suspect tendon-sheath inflammation and swelling. Neuro-vascular exam normal.       Assessment & Plan:  1. Tendon sheath inflammation - a problem of swelling that appears to be getting better. No significant compromise of function.  Plan - heat, otc NSAIDs (GI precautions  given).           For persistent or worsening swelling - refer to hand surgeon.

## 2011-05-31 ENCOUNTER — Telehealth: Payer: Self-pay | Admitting: *Deleted

## 2011-05-31 NOTE — Telephone Encounter (Signed)
Referral to the hand center put in for [facility]

## 2011-05-31 NOTE — Telephone Encounter (Signed)
Pt called left hand is no better swelling continues with use of OTC NSAIDS and heat.  She wants you to go ahead and refer her to a Hydrographic surveyor.

## 2011-06-01 NOTE — Telephone Encounter (Signed)
Informed pt .

## 2011-09-11 ENCOUNTER — Other Ambulatory Visit: Payer: Self-pay | Admitting: Obstetrics and Gynecology

## 2011-09-11 DIAGNOSIS — Z1231 Encounter for screening mammogram for malignant neoplasm of breast: Secondary | ICD-10-CM

## 2011-10-19 ENCOUNTER — Ambulatory Visit (HOSPITAL_COMMUNITY)
Admission: RE | Admit: 2011-10-19 | Discharge: 2011-10-19 | Disposition: A | Discharge status: Home / Self Care | Payer: 59 | Source: Ambulatory Visit | Attending: Obstetrics and Gynecology | Admitting: Obstetrics and Gynecology

## 2011-10-19 DIAGNOSIS — Z1231 Encounter for screening mammogram for malignant neoplasm of breast: Secondary | ICD-10-CM | POA: Insufficient documentation

## 2012-08-30 ENCOUNTER — Telehealth: Payer: Self-pay | Admitting: Gastroenterology

## 2012-08-30 NOTE — Telephone Encounter (Signed)
Left message for pt to call back.  Pt states that she had cdiff in the past and that since that time she has had trouble maintaining her weight. Pt is not having any loose stools at this time. Reports she has not changed her diet at all. Requesting and appt with either Dr. Marina Goodell or midlevel. Let pt know I would call her back next week with and appt.

## 2012-09-02 NOTE — Telephone Encounter (Signed)
Pt states that the problem she is having is actually gaining weight. Pt wondered if perhaps the cdiff may be back causing her to gain weight. Discussed with pt that she would not be gaining weight she would actually be loosing weight and having diarrhea stools again. Pt instructed to discuss this with her PCP. Pt verbalized understanding.

## 2012-09-10 ENCOUNTER — Other Ambulatory Visit: Payer: Self-pay | Admitting: Obstetrics and Gynecology

## 2012-09-10 DIAGNOSIS — Z1231 Encounter for screening mammogram for malignant neoplasm of breast: Secondary | ICD-10-CM

## 2012-10-22 ENCOUNTER — Ambulatory Visit (INDEPENDENT_AMBULATORY_CARE_PROVIDER_SITE_OTHER): Payer: 59 | Admitting: Internal Medicine

## 2012-10-22 ENCOUNTER — Other Ambulatory Visit (INDEPENDENT_AMBULATORY_CARE_PROVIDER_SITE_OTHER): Payer: 59

## 2012-10-22 ENCOUNTER — Encounter: Payer: Self-pay | Admitting: Internal Medicine

## 2012-10-22 VITALS — BP 134/88 | HR 86 | Temp 97.8°F | Resp 16 | Wt 141.0 lb

## 2012-10-22 DIAGNOSIS — R8290 Unspecified abnormal findings in urine: Secondary | ICD-10-CM

## 2012-10-22 DIAGNOSIS — G4730 Sleep apnea, unspecified: Secondary | ICD-10-CM

## 2012-10-22 DIAGNOSIS — K589 Irritable bowel syndrome without diarrhea: Secondary | ICD-10-CM

## 2012-10-22 DIAGNOSIS — Z0000 Encounter for general adult medical examination without abnormal findings: Secondary | ICD-10-CM

## 2012-10-22 DIAGNOSIS — Z13220 Encounter for screening for lipoid disorders: Secondary | ICD-10-CM

## 2012-10-22 DIAGNOSIS — R82998 Other abnormal findings in urine: Secondary | ICD-10-CM

## 2012-10-22 DIAGNOSIS — Z23 Encounter for immunization: Secondary | ICD-10-CM

## 2012-10-22 LAB — URINALYSIS, ROUTINE W REFLEX MICROSCOPIC
Hgb urine dipstick: NEGATIVE
Nitrite: NEGATIVE
Total Protein, Urine: NEGATIVE
pH: 6 (ref 5.0–8.0)

## 2012-10-22 LAB — LDL CHOLESTEROL, DIRECT: Direct LDL: 141.6 mg/dL

## 2012-10-22 LAB — COMPREHENSIVE METABOLIC PANEL
AST: 36 U/L (ref 0–37)
Alkaline Phosphatase: 45 U/L (ref 39–117)
BUN: 13 mg/dL (ref 6–23)
Creatinine, Ser: 0.7 mg/dL (ref 0.4–1.2)

## 2012-10-22 LAB — CBC WITH DIFFERENTIAL/PLATELET
Basophils Absolute: 0 10*3/uL (ref 0.0–0.1)
Hemoglobin: 14 g/dL (ref 12.0–15.0)
Lymphocytes Relative: 22.7 % (ref 12.0–46.0)
Monocytes Relative: 6.9 % (ref 3.0–12.0)
Neutro Abs: 4.5 10*3/uL (ref 1.4–7.7)
Platelets: 207 10*3/uL (ref 150.0–400.0)
RDW: 13.6 % (ref 11.5–14.6)
WBC: 6.6 10*3/uL (ref 4.5–10.5)

## 2012-10-22 LAB — LIPID PANEL
Cholesterol: 204 mg/dL — ABNORMAL HIGH (ref 0–200)
HDL: 53 mg/dL (ref >39.00)
VLDL: 9.2 mg/dL (ref 0.0–40.0)

## 2012-10-22 NOTE — Progress Notes (Signed)
Subjective:    Patient ID: NOTNAMED Gloria Werner, female    DOB: [birth year], 71 y.o.   MRN: [id number]  HPI The patient is here for annual wellness examination and management of other chronic and acute problems.  Interval history - she did have a UTI - seen and treated at Urgent Care.    The risk factors are reflected in the social history.  The roster of all physicians providing medical care to patient - is listed in the Snapshot section of the chart.  Activities of daily living:  The patient is 100% inedpendent in all ADLs: dressing, toileting, feeding as well as independent mobility  Home safety : The patient has smoke detectors in the home. Falls - none. They wear seatbelts.  firearms are present in the home, kept in a safe fashion. There is no violence in the home.   There is no risks for hepatitis, STDs or HIV. There is no   history of blood transfusion. They have no travel history to infectious disease endemic areas of the world.  The patient has seen their dentist in the last six month. They have seen their eye doctor in the last year. They deny any hearing difficulty and have not had audiologic testing in the last year.    They do not  have excessive sun exposure. Discussed the need for sun protection: hats, long sleeves and use of sunscreen if there is significant sun exposure.   Diet: the importance of a healthy diet is discussed. They do have a healthy diet.  The patient has a regular exercise program: walking  , 40 min duration, 4 per week.  The benefits of regular aerobic exercise were discussed.  Depression screen: there are no signs or vegative symptoms of depression- irritability, change in appetite, anhedonia, sadness/tearfullness.  Cognitive assessment: the patient manages all their financial and personal affairs and is actively engaged.   Past Medical History  Diagnosis Date  . Allergic rhinitis due to pollen   . Trigger finger (acquired)   . Diverticulitis of  colon (without mention of hemorrhage)   . Abdominal pain, left lower quadrant   . Irritable bowel syndrome   . Diverticulosis of colon (without mention of hemorrhage)   . Cystocele     uses pessary   Past Surgical History  Procedure Date  . Abdominal hysterectomy   . Tonsillectomy   . Nasal sinus surgery   . Bladder suspension     A-P with Hyst  . G2p2    Family History  Problem Relation Age of Onset  . Emphysema Mother   . Cancer Father 63    lung cancer  . Coronary artery disease Brother   . Cancer Brother     breast cancer  . Diabetes Neg Hx   . Hypertension Neg Hx   . Cancer Brother     lung cancer   History   Social History  . Marital Status: Married    Spouse Name: N/A    Number of Children: 2  . Years of Education: 12   Occupational History  . business Production designer, theatre/television/film    Social History Main Topics  . Smoking status: Never Smoker   . Smokeless tobacco: Never Used  . Alcohol Use: No  . Drug Use: No  . Sexually Active: Yes -- Female partner(s)   Other Topics Concern  . Not on file   Social History Narrative   HSG. Married '63. 1 son- 39'; 1-daughter-'68; grandchildren 3. 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 Marriage in good health. 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 Provided lead to http://bridges.com/.      Vision, hearing, body mass index were assessed and reviewed.   During the course of the visit the patient was educated and counseled about appropriate screening and preventive services including : fall prevention , diabetes screening, nutrition counseling, colorectal cancer screening, and recommended immunizations.    Review of Systems Constitutional:  Negative for fever, chills, activity change and unexpected weight change.  HEENT:  Negative for hearing loss, ear pain, congestion, neck stiffness and postnasal drip. Negative for sore throat or swallowing problems. Negative  for dental complaints.   Eyes: Negative for vision loss or change in visual acuity.  Respiratory: Negative for chest tightness and wheezing. Negative for DOE.   Cardiovascular: Negative for chest pain or palpitations. No decreased exercise tolerance Gastrointestinal: No change in bowel habit. No bloating or gas. No reflux or indigestion Genitourinary: Negative for urgency, frequency, flank pain and difficulty urinating.  Musculoskeletal: Negative for myalgias, back pain, arthralgias and gait problem.  Neurological: Negative for dizziness, tremors, weakness and headaches.  Hematological: Negative for adenopathy.  Psychiatric/Behavioral: Negative for behavioral problems and dysphoric mood.       Objective:   Physical Exam Filed Vitals:   10/22/12 1343  BP: 134/88  Pulse: 86  Temp: 97.8 F (36.6 C)  Resp: 16   Wt Readings from Last 3 Encounters:  10/22/12 141 lb (63.957 kg)  05/19/11 142 lb (64.411 kg)  11/23/10 134 lb (60.782 kg)   Gen'l: well nourished, well developed white woman in no distress HEENT - [place]/AT, EACs/TMs normal, oropharynx with native dentition in good condition, no buccal or palatal lesions, posterior pharynx clear, mucous membranes moist. C&S clear, PERRLA, fundi - normal Neck - supple, no thyromegaly Nodes- negative submental, cervical, supraclavicular regions Chest - no deformity, no CVAT Lungs - cleat without rales, wheezes. No increased work of breathing Breast - deferred to gyn and mammography Cardiovascular - regular rate and rhythm, quiet precordium, no murmurs, rubs or gallops, 2+ radial, DP and PT pulses Abdomen - BS+ x 4, no HSM, no guarding or rebound or tenderness Pelvic - deferred to gyn Rectal - deferred to gyn Extremities - no clubbing, cyanosis, edema or deformity.  Neuro - A&O x 3, CN II-XII normal, motor strength normal and equal, DTRs 2+ and symmetrical biceps, radial, and patellar tendons. Cerebellar - no tremor, no rigidity, fluid movement  and normal gait. Derm - Head, neck, back, abdomen and extremities without suspicious lesions  Lab Results  Component Value Date   WBC 6.6 10/22/2012   HGB 14.0 10/22/2012   HCT 42.6 10/22/2012   PLT 207.0 10/22/2012   GLUCOSE 84 10/22/2012   CHOL 204* 10/22/2012   TRIG 46.0 10/22/2012   HDL 53.00 10/22/2012   LDLDIRECT 141.6 10/22/2012   ALT 25 10/22/2012   AST 36 10/22/2012   NA 141 10/22/2012   K 4.0 10/22/2012   CL 104 10/22/2012   CREATININE 0.7 10/22/2012   BUN 13 10/22/2012   CO2 30 10/22/2012   TSH 1.17 10/22/2012         Assessment & Plan:

## 2012-10-22 NOTE — Patient Instructions (Addendum)
Thanks for coming to see me. Everything looks fine.  We will set up an over-night pulse oximetry study to look for sleep apnea  Come back in about a month for a shingles vaccine.   See you in a year

## 2012-10-23 DIAGNOSIS — Z0000 Encounter for general adult medical examination without abnormal findings: Secondary | ICD-10-CM | POA: Insufficient documentation

## 2012-10-23 NOTE — Assessment & Plan Note (Signed)
Interval medical history is unremarkable. Limited physical exam is normal. She is current with her gynecologist, current with colorectal cancer screening but due July '14, current with breast cancer screening. Immunizations are brought up to date and she will return for Shingles vaccine in 4+ weeks. Labs reveiwed - LDL cholesterol is above goal of 130 but below treatment threshold of 160 (NCEP ATP III) with low to moderate cardiac risk profile. Recommend continued attention to low fat diet and continued regular exercise. Repeat lipid panel in 1 year.  In summary - a very nice woman who appears to be medically stable but does need to work on lowering cholesterol with life-style management. She will return in 1 year or sooner as needed.

## 2012-10-23 NOTE — Assessment & Plan Note (Signed)
Stable and doing well w/o report of discomfort.

## 2012-10-24 ENCOUNTER — Ambulatory Visit (HOSPITAL_COMMUNITY)
Admission: RE | Admit: 2012-10-24 | Discharge: 2012-10-24 | Disposition: A | Discharge status: Home / Self Care | Payer: 59 | Source: Ambulatory Visit | Attending: Obstetrics and Gynecology | Admitting: Obstetrics and Gynecology

## 2012-10-24 DIAGNOSIS — Z1231 Encounter for screening mammogram for malignant neoplasm of breast: Secondary | ICD-10-CM

## 2012-10-30 ENCOUNTER — Telehealth: Payer: Self-pay | Admitting: *Deleted

## 2012-10-30 NOTE — Telephone Encounter (Signed)
Pt had questions about results of UA seen on MyChart-she wants to know if she needs to do anything regarding the bacteria that was found-also wants to know if anything needs to be done regarding her cholesterol.

## 2012-10-31 NOTE — Telephone Encounter (Signed)
Pt informed of MD's advisement. Pt is not having any urinary sxs at this time.

## 2012-10-31 NOTE — Telephone Encounter (Signed)
1. If having any UTI symptoms (burning, frequency, urgency, etc) - Rx Septra DS bid x 5 days 2. Should receive (or received) copy of office note: recommend life-style management (diet , exercise) with repeat in 1 year.

## 2012-11-28 ENCOUNTER — Ambulatory Visit (INDEPENDENT_AMBULATORY_CARE_PROVIDER_SITE_OTHER): Payer: 59 | Admitting: *Deleted

## 2012-11-28 DIAGNOSIS — Z23 Encounter for immunization: Secondary | ICD-10-CM

## 2012-11-28 DIAGNOSIS — Z2911 Encounter for prophylactic immunotherapy for respiratory syncytial virus (RSV): Secondary | ICD-10-CM

## 2013-01-03 ENCOUNTER — Encounter: Payer: Self-pay | Admitting: Internal Medicine

## 2013-01-13 ENCOUNTER — Encounter: Payer: Self-pay | Admitting: Internal Medicine

## 2013-01-25 LAB — HM COLONOSCOPY

## 2013-03-13 ENCOUNTER — Telehealth: Payer: Self-pay | Admitting: Nurse Practitioner

## 2013-03-13 MED ORDER — ESTRADIOL 2 MG VA RING: 2.0000 mg | VAGINAL_RING | VAGINAL | Status: DC

## 2013-03-13 NOTE — Telephone Encounter (Signed)
Yes she may have a refill X 1 year. Use every 3 months. Given coupon at last visit

## 2013-03-13 NOTE — Telephone Encounter (Signed)
Pt would like to get a pres for estrogen ring pt states it is working well

## 2013-04-15 ENCOUNTER — Encounter: Payer: Self-pay | Admitting: Nurse Practitioner

## 2013-04-17 ENCOUNTER — Ambulatory Visit (INDEPENDENT_AMBULATORY_CARE_PROVIDER_SITE_OTHER): Payer: 59 | Admitting: Nurse Practitioner

## 2013-04-17 ENCOUNTER — Encounter: Payer: Self-pay | Admitting: Nurse Practitioner

## 2013-04-17 VITALS — BP 136/80 | HR 84 | Ht 60.0 in | Wt 140.6 lb

## 2013-04-17 DIAGNOSIS — N898 Other specified noninflammatory disorders of vagina: Secondary | ICD-10-CM

## 2013-04-17 DIAGNOSIS — N813 Complete uterovaginal prolapse: Secondary | ICD-10-CM

## 2013-04-17 DIAGNOSIS — N899 Noninflammatory disorder of vagina, unspecified: Secondary | ICD-10-CM

## 2013-04-17 NOTE — Progress Notes (Signed)
72 y.o. Married White female [id number] here for pessary check.  Patient has been using following pessary style and size:  Ring pessary with support size 6" ring.  She is sexually active.  She describes no issues with the pessary and is able to remove herself and reinsert. . ROS: No hematuria or urinary symptoms.  The only positive finding is for - vaginal spotting that was pink tinged is much better on the Estring.  Only occasionally with heavy lifting or bowel movement will she experience light pink spotting.  Exam:   BP 136/80  Pulse 84  Ht 5' (1.524 m)  Wt 140 lb 9.6 oz (63.776 kg)  BMI 27.46 kg/m2 General appearance: alert and cooperative Inguinal adenopathy: negative   Pelvic: External genitalia:  no lesions and well estrogenized              Urethra: not indicated and normal appearing urethra with no masses, tenderness or lesions              Bartholin's and Skene's: normal                 Vagina: vaginal erythema along the left sidewall much some improved from last visit. The granulation  tissue is again treated with silver nitrate and tolerated well. New Estring is inserted before the pessary  was replaced.              Cervix: absent Bimanual Exam:  Uterus:  surgically absent, vaginal cuff well healed                               Adnexa:    not indicated                               Anus:  defer exam  Pessary was removed without difficulty.  Pessary was cleansed.  Pessary was replaced. Patient tolerated procedure well.    A:  Cystocele / Rectocele with use of 6" ring pessary with support  Granulation tissue along left sidewall that has been treated with silver nitrate  Use of Estring has improved pt. Quality of life and has improved the vaginal spotting from the granulation  tissue.         P:  Return to office in 3 months for recheck.  If granulation tissue is much better then she can continue to remove and clean on her own and just return for yearly exams.  She will need a  new Rx for Estring before next visit. She is now using a mail order pharmacy but does not know the name and will call back so we can put the order in.            An After Visit Summary was printed and given to the patient.

## 2013-04-17 NOTE — Patient Instructions (Addendum)

## 2013-04-20 NOTE — Progress Notes (Signed)
Encounter reviewed by Dr. Brook Silva.  

## 2013-04-23 ENCOUNTER — Telehealth: Payer: Self-pay | Admitting: Nurse Practitioner

## 2013-04-23 ENCOUNTER — Telehealth: Payer: Self-pay | Admitting: Internal Medicine

## 2013-04-23 MED ORDER — SCOPOLAMINE 1 MG/3DAYS TD PT72: 1.0000 | MEDICATED_PATCH | TRANSDERMAL | Status: DC

## 2013-04-23 NOTE — Telephone Encounter (Signed)
Mathea states she is going on a cruise in July 2014. Requesting a prescription for Scopolamine patches for motion sickness - enough for four days be faxed to [facility] fax # [phone]. Was told patches can be purchased over the counter but pharmacy states prescription is required.

## 2013-04-23 NOTE — Telephone Encounter (Signed)
Done. thanks

## 2013-04-23 NOTE — Telephone Encounter (Signed)
Pt is calling to give Gloria Werner her pharmacy information. Pt uses [facility], fax # [phone]. Pt would like to have her estring sent to this pharmacy and was wonderign if you would also send her Vit D prescription here as well. Gloria Werner wrote her a prescription for her Vit D but it would be easier if they were both sent to [facility] instead.

## 2013-04-23 NOTE — Telephone Encounter (Signed)
Refill request for VITAMIN D 50,000 weekly Last filled- 01/06/13, #26  Refill request for ESTRING Last filled - 03/13/13, #1 X 3 Per phone note on same day, OK to fill for one year.  Last AEX - 01/06/13 Last Vit D level - 01/06/13 = 64 Follow up - periodically for Pessary check.  Please advise if OK to send 90 day supply to mail order and I will send.  Chart on your shelf.  Thanks.

## 2013-04-24 ENCOUNTER — Other Ambulatory Visit: Payer: Self-pay | Admitting: *Deleted

## 2013-04-24 MED ORDER — ESTRADIOL 2 MG VA RING: 2.0000 mg | VAGINAL_RING | VAGINAL | Status: DC

## 2013-04-24 MED ORDER — VITAMIN D (ERGOCALCIFEROL) 1.25 MG (50000 UNIT) PO CAPS: 50000.0000 [IU] | ORAL_CAPSULE | ORAL | Status: DC

## 2013-04-24 NOTE — Telephone Encounter (Signed)
Patient notified of Rx sent to [facility]. notifed of change in vitamin D to once a month.

## 2013-04-24 NOTE — Telephone Encounter (Signed)
Ok to refill 90 day and refill through next AEX for Vit D (but reduce dose to monthly), Estring q 3 months.

## 2013-04-24 NOTE — Telephone Encounter (Signed)
LM that Rx was sent to [facility]

## 2013-04-25 NOTE — Telephone Encounter (Signed)
Next AEX - 01/12/14 RX's sent by Fannie Knee on 04/24/13

## 2013-04-30 ENCOUNTER — Other Ambulatory Visit: Payer: Self-pay | Admitting: *Deleted

## 2013-04-30 MED ORDER — VITAMIN D (ERGOCALCIFEROL) 1.25 MG (50000 UNIT) PO CAPS: 50000.0000 [IU] | ORAL_CAPSULE | ORAL | Status: DC

## 2013-04-30 NOTE — Telephone Encounter (Signed)
RX filled on 04/24/13.  Mail order pharmacy requesting clarification on dose/quantity.   RX resent with correct quantity for 90 day supply.

## 2013-05-05 ENCOUNTER — Other Ambulatory Visit: Payer: Self-pay

## 2013-05-05 MED ORDER — SCOPOLAMINE 1 MG/3DAYS TD PT72: 1.0000 | MEDICATED_PATCH | TRANSDERMAL | Status: DC

## 2013-05-13 ENCOUNTER — Encounter: Payer: Self-pay | Admitting: Nurse Practitioner

## 2013-05-13 ENCOUNTER — Ambulatory Visit (INDEPENDENT_AMBULATORY_CARE_PROVIDER_SITE_OTHER): Payer: 59 | Admitting: Nurse Practitioner

## 2013-05-13 VITALS — BP 122/60 | HR 68 | Temp 98.0°F | Ht 60.5 in | Wt 140.6 lb

## 2013-05-13 DIAGNOSIS — N760 Acute vaginitis: Secondary | ICD-10-CM

## 2013-05-13 DIAGNOSIS — A499 Bacterial infection, unspecified: Secondary | ICD-10-CM

## 2013-05-13 DIAGNOSIS — B9689 Other specified bacterial agents as the cause of diseases classified elsewhere: Secondary | ICD-10-CM

## 2013-05-13 DIAGNOSIS — M25559 Pain in unspecified hip: Secondary | ICD-10-CM

## 2013-05-13 MED ORDER — METRONIDAZOLE 0.75 % VA GEL: 1.0000 | Freq: Every day | VAGINAL | Status: DC

## 2013-05-13 NOTE — Patient Instructions (Signed)
Vaginitis  Vaginitis is an infection. It causes soreness, swelling, and redness (inflammation) of the vagina. Many of these infections are sexually transmitted diseases (STDs). Having unprotected sex can cause further problems and complications such as:   Chronic pelvic pain.   Infertility.   Unwanted pregnancy.   Abortion.   Tubal pregnancy.   Infection passed on to the newborn.   Cancer.  CAUSES    Monilia. This is a yeast or fungus infection, not an STD.   Bacterial vaginosis. The normal balance of bacteria in the vagina is disrupted and is replaced by an overgrowth of certain bacteria.   Gonorrhea, chlamydia. These are bacterial infections that are STDs.   Vaginal sponges, diaphragms, and intrauterine devices.   Trichomoniasis. This is a STD infection caused by a parasite.   Viruses like herpes and human papillomavirus. Both are STDs.   Pregnancy.   Immunosuppression. This occurs with certain conditions such as HIV infection or cancer.   Using bubble bath.   Taking certain antibiotic medicines.   Sporadic recurrence can occur if you become sick.   Diabetes.   Steroids.   Allergic reaction. If you have an allergy to:   Douches.   Soaps.   Spermicides.   Condoms.   Scented tampons or vaginal sprays.  SYMPTOMS    Abnormal vaginal discharge.   Itching of the vagina.   Pain in the vagina.   Swelling of the vagina.  In some cases, there are no symptoms.  TREATMENT   Treatment will vary depending on the type of infection.   Bacteria or trichomonas are usually treated with oral antibiotics and sometimes vaginal cream or suppositories.   Monilia vaginitis is usually treated with vaginal creams, suppositories, or oral antifungal pills.   Viral vaginitis has no cure. However, the symptoms of herpes (a viral vaginitis) can be treated to relieve the discomfort. Human papillomavirus has no symptoms. However, there are treatments for the diseases caused by human papillomavirus.   With allergic  vaginitis, you need to stop using the product that is causing the problem. Vaginal creams can be used to treat the symptoms.   When treating an STD, the sex partner should also be treated.  HOME CARE INSTRUCTIONS    Take all the medicines as directed by your caregiver.   Do not use scented tampons, soaps, or vaginal sprays.   Do not douche.   Tell your sex partner if you have a vaginal infection or an STD.   Do not have sexual intercourse until you have treated the vaginitis.   Practice safe sex by using condoms.  SEEK MEDICAL CARE IF:    You have abdominal pain.   Your symptoms get worse during treatment.  Document Released: 10/08/2007 Document Revised: 03/04/2012 Document Reviewed: 06/03/2009  ExitCare Patient Information 2013 [employer].

## 2013-05-13 NOTE — Progress Notes (Signed)
72 y.o. Married White female [id number] here for pessary check.  Patient has been using following pessary style and size:  6" ring pessary.  She is sexually active. She is able to remove pessary on her own.  Currently  she describes the following issues with the pessary:  For about 3 days felt a lower inner pelvic discomfort that she thought ws related to the pessary.  She removed the pessary and 'achy' feeling went away.  3 days later reinserted the pessary and again had the same achy feeling with a low grade temperature. Unsure if this may be related to the granulation tissue which we have been treating with silver nitrate.  She again removed the pessary and has left it out since then with having again a 2 day history of low grade fever.  She did leave the Estring in place.  She notes slight clear vaginal discharge and no redness or bleeding after last treatment with silver nitrate which was on 4/27. Denies any bladder symptoms or dysuria.  ROS: no breast pain or new or enlarging lumps on self exam, no side effects of hormonal medications, no dysuria, trouble voiding or hematuria  Exam:   BP 122/60  Pulse 68  Temp(Src) 98 F (36.7 C) (Oral)  Ht 5' 0.5" (1.537 m)  Wt 140 lb 9.6 oz (63.776 kg)  BMI 27 kg/m2 General appearance: alert, cooperative and appears stated age Inguinal adenopathy: negative   Pelvic: External genitalia:  no lesions and well estrogenized              Urethra: normal appearing urethra with no masses, tenderness or lesions              Bartholin's and Skene's: normal                 Vagina: vaginal erythema at the 2 usual sites at 3 & 9:00 position with granulation tissue improved overall with only slight pink discoloration on swab.              Cervix: absent  Bimanual Exam:  Uterus:  surgically absent, vaginal cuff well healed                               Adnexa:    no masses                               Anus:  defer exam    Pessary was not replaced. Estring was  replaced.  Patient tolerated procedure well.    A:  Pelvic pain of questionable etiology  Possible vaginitis from treatment of granulation tissue          P:   Trial of Metrogel vaginal cream hs for 5 nights.    Monitor fever if worsens to call back ' Will send UA for C& S - but it was discarded by mistake - will have patient return for C& S  Asked patient to notify us / me via e-mail and get a progress report in 1 week.  Plan to leave pessary out for the time being         An After Visit Summary was printed and given to the patient.

## 2013-05-14 ENCOUNTER — Telehealth: Payer: Self-pay | Admitting: Nurse Practitioner

## 2013-05-14 NOTE — Telephone Encounter (Signed)
Patient said that's he was calling to speak specifically with Patty's nurse in regards to test results.

## 2013-05-14 NOTE — Telephone Encounter (Signed)
Pt will arrive in office this afternoon to leave a urine specimen for culturing.

## 2013-05-15 ENCOUNTER — Other Ambulatory Visit (INDEPENDENT_AMBULATORY_CARE_PROVIDER_SITE_OTHER): Payer: 59

## 2013-05-15 DIAGNOSIS — N390 Urinary tract infection, site not specified: Secondary | ICD-10-CM

## 2013-05-17 LAB — URINE CULTURE: Colony Count: 15000

## 2013-05-20 ENCOUNTER — Telehealth: Payer: Self-pay | Admitting: *Deleted

## 2013-05-20 NOTE — Telephone Encounter (Signed)
Pt is aware of negative urine culture results. Pt states she re-inserted her pessary yesterday and pelvic pain vanished.

## 2013-05-20 NOTE — Telephone Encounter (Signed)
Message copied by Osie Bond on Tue May 20, 2013 10:29 AM ------      Message from: Ria Comment R      Created: Tue May 20, 2013  8:32 AM       Let pt know that urine C & S is negative. Does she have any further pelvic pain? ------

## 2013-05-22 NOTE — Progress Notes (Signed)
Reviewed personally.  MSM 

## 2013-06-05 ENCOUNTER — Encounter: Payer: Self-pay | Admitting: Internal Medicine

## 2013-06-06 ENCOUNTER — Encounter: Payer: Self-pay | Admitting: Internal Medicine

## 2013-06-18 ENCOUNTER — Encounter: Payer: Self-pay | Admitting: Internal Medicine

## 2013-06-18 ENCOUNTER — Ambulatory Visit (INDEPENDENT_AMBULATORY_CARE_PROVIDER_SITE_OTHER): Payer: 59 | Admitting: Internal Medicine

## 2013-06-18 VITALS — BP 128/78 | HR 85 | Temp 98.2°F | Ht 60.5 in | Wt 144.0 lb

## 2013-06-18 DIAGNOSIS — J029 Acute pharyngitis, unspecified: Secondary | ICD-10-CM

## 2013-06-18 DIAGNOSIS — R070 Pain in throat: Secondary | ICD-10-CM

## 2013-06-18 MED ORDER — METHYLPREDNISOLONE ACETATE 80 MG/ML IJ SUSP
80.0000 mg | Freq: Once | INTRAMUSCULAR | Status: AC
  Administered 2013-06-18: 80 mg via INTRAMUSCULAR

## 2013-06-18 NOTE — Addendum Note (Signed)
Addended by: Carin Primrose on: 06/18/2013 03:45 PM   Modules accepted: Orders

## 2013-06-18 NOTE — Patient Instructions (Signed)
Sore Throat A sore throat is pain, burning, irritation, or scratchiness of the throat. There is often pain or tenderness when swallowing or talking. A sore throat may be accompanied by other symptoms, such as coughing, sneezing, fever, and swollen neck glands. A sore throat is often the first sign of another sickness, such as a cold, flu, strep throat, or mononucleosis (commonly known as mono). Most sore throats go away without medical treatment. CAUSES  The most common causes of a sore throat include:  A viral infection, such as a cold, flu, or mono.  A bacterial infection, such as strep throat, tonsillitis, or whooping cough.  Seasonal allergies.  Dryness in the air.  Irritants, such as smoke or pollution.  Gastroesophageal reflux disease (GERD). HOME CARE INSTRUCTIONS   Only take over-the-counter medicines as directed by your caregiver.  Drink enough fluids to keep your urine clear or pale yellow.  Rest as needed.  Try using throat sprays, lozenges, or sucking on hard candy to ease any pain (if older than 4 years or as directed).  Sip warm liquids, such as broth, herbal tea, or warm water with honey to relieve pain temporarily. You may also eat or drink cold or frozen liquids such as frozen ice pops.  Gargle with salt water (mix 1 tsp salt with 8 oz of water).  Do not smoke and avoid secondhand smoke.  Put a cool-mist humidifier in your bedroom at night to moisten the air. You can also turn on a hot shower and sit in the bathroom with the door closed for 5 10 minutes. SEEK IMMEDIATE MEDICAL CARE IF:  You have difficulty breathing.  You are unable to swallow fluids, soft foods, or your saliva.  You have increased swelling in the throat.  Your sore throat does not get better in 7 days.  You have nausea and vomiting.  You have a fever or persistent symptoms for more than 2 3 days.  You have a fever and your symptoms suddenly get worse. MAKE SURE YOU:   Understand  these instructions.  Will watch your condition.  Will get help right away if you are not doing well or get worse. Document Released: 01/18/2005 Document Revised: 11/27/2012 Document Reviewed: 08/18/2012 ExitCare Patient Information 2014 [employer].  

## 2013-06-18 NOTE — Progress Notes (Signed)
Subjective:    Patient ID: Gloria Werner, female    DOB: 03-04-1941, 72 y.o.   MRN: [id number]  HPI  Pt presents to the clinic today with c/o something stuck in her throat. This occurred on Monday. She was eating cantaloupe when she accidentally swallowed a whole chunk. She has a history of eating fast and not chewing her food. She denies difficulty breathing. She does c/o some reflux but is not currently being treated.    Review of Systems  Past Medical History  Diagnosis Date  . Allergic rhinitis due to pollen   . Trigger finger (acquired)   . Diverticulitis of colon (without mention of hemorrhage)   . Abdominal pain, left lower quadrant   . Irritable bowel syndrome   . Diverticulosis of colon (without mention of hemorrhage)   . Cystocele     uses pessary  . 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 calcium carbonate (OS-CAL) 600 MG TABS Take 600 mg by mouth 2 (two) times daily with a meal.        . estradiol (ESTRING) 2 MG vaginal ring Place 2 mg vaginally every 3 (three) months. follow package directions  3 each  3  . metroNIDAZOLE (METROGEL) 0.75 % vaginal gel Place 1 Applicatorful vaginally at bedtime.  70 g  0  . Multiple Vitamin (MULTIVITAMIN) tablet Take 1 tablet by mouth daily.        . polyethylene glycol powder (GLYCOLAX/MIRALAX) powder Take 17 g by mouth daily.      . Pyridoxine HCl (B-6 PO) Take 1 tablet by mouth daily.      Marland Kitchen scopolamine (TRANSDERM-SCOP) 1.5 MG Place 1 patch (1.5 mg total) onto the skin every 3 (three) days.  10 patch  0  . Vitamin D, Ergocalciferol, (DRISDOL) 50000 UNITS CAPS Take 1 capsule (50,000 Units total) by mouth every 30 (thirty) days.  13 capsule  3   No current facility-administered medications for this visit.    Allergies  Allergen Reactions  . Codeine Nausea And Vomiting    Family History  Problem Relation Age of Onset  . Emphysema Mother   . Lymphoma Mother    . Asthma Mother   . Cancer Father 57    lung cancer  . Coronary artery disease Brother   . Cancer Brother     breast cancer  . Diabetes Neg Hx   . Hypertension Neg Hx   . Cancer Brother     lung cancer    History   Social History  . Marital Status: Married    Spouse Name: N/A    Number of Children: 2  . Years of Education: 12   Occupational History  . business Production designer, theatre/television/film    Social History Main Topics  . Smoking status: Never Smoker   . Smokeless tobacco: Never Used  . Alcohol Use: No  . Drug Use: No  . Sexually Active: Yes -- Female partner(s)    Birth Control/ Protection: Surgical   Other Topics Concern  . Not on file   Social History Narrative   HSG. Married '63. 1 son- 12'; 1-daughter-'68; grandchildren 3. Work; helps with family business; works for [employer], Education officer, environmental business. Marriage in good health. 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 Provided lead to http://bridges.com/.     Constitutional: Denies fever, malaise, fatigue, headache or abrupt weight changes.  HEENT: Pt reports something stuck in her throat.  Denies eye pain, eye redness, ear pain, ringing in the ears, wax buildup, runny nose, nasal congestion, bloody nose. Respiratory: Denies difficulty breathing, shortness of breath, cough or sputum production.   Cardiovascular: Denies chest pain, chest tightness, palpitations or swelling in the hands or feet.  Gastrointestinal: Denies abdominal pain, bloating, constipation, diarrhea or blood in the stool.    No other specific complaints in a complete review of systems (except as listed in HPI above).     Objective:   Physical Exam  BP 128/78  Pulse 85  Temp(Src) 98.2 F (36.8 C) (Oral)  Ht 5' 0.5" (1.537 m)  Wt 144 lb (65.318 kg)  BMI 27.65 kg/m2  SpO2 97% Wt Readings from Last 3 Encounters:  06/18/13 144 lb (65.318 kg)  05/13/13 140 lb 9.6 oz (63.776 kg)  04/17/13 140 lb 9.6 oz (63.776 kg)    General:  Appears her stated age, well developed, well nourished in NAD. HEENT: Head: normal shape and size; Eyes: sclera white, no icterus, conjunctiva pink, PERRLA and EOMs intact; Ears: Tm's gray and intact, normal light reflex; Nose: mucosa pink and moist, septum midline; Throat/Mouth: Teeth present, mucosa erythematous and moist, no exudate, lesions or ulcerations noted.  Neck: Normal range of motion. Neck supple, trachea midline. No massses, lumps or thyromegaly present.  Cardiovascular: Normal rate and rhythm. S1,S2 noted.  No murmur, rubs or gallops noted. No JVD or BLE edema. No carotid bruits noted. Pulmonary/Chest: Normal effort and positive vesicular breath sounds. No respiratory distress. No wheezes, rales or ronchi noted.   BMET    Component Value Date/Time   NA 141 10/22/2012 1439   K 4.0 10/22/2012 1439   CL 104 10/22/2012 1439   CO2 30 10/22/2012 1439   GLUCOSE 84 10/22/2012 1439   BUN 13 10/22/2012 1439   CREATININE 0.7 10/22/2012 1439   CALCIUM 9.3 10/22/2012 1439   GFRNONAA 90 02/26/2008 1019   GFRAA 108 02/26/2008 1019    Lipid Panel     Component Value Date/Time   CHOL 204* 10/22/2012 1439   TRIG 46.0 10/22/2012 1439   HDL 53.00 10/22/2012 1439   CHOLHDL 4 10/22/2012 1439   VLDL 9.2 10/22/2012 1439    CBC    Component Value Date/Time   WBC 6.6 10/22/2012 1439   RBC 4.74 10/22/2012 1439   HGB 14.0 10/22/2012 1439   HCT 42.6 10/22/2012 1439   PLT 207.0 10/22/2012 1439   MCV 89.9 10/22/2012 1439   MCHC 33.0 10/22/2012 1439   RDW 13.6 10/22/2012 1439   LYMPHSABS 1.5 10/22/2012 1439   MONOABS 0.5 10/22/2012 1439   EOSABS 0.1 10/22/2012 1439   BASOSABS 0.0 10/22/2012 1439    Hgb A1C No results found for this basename: HGBA1C         Assessment & Plan:   Pharyngitis due to food stuck in the throat:- no obvious obstruction  Chew your food well and try to eat slowly Get Mylanta OTC for the irritation Will give 80 mg Depo IM today for inflammation in the  throat  If not better or you experience and difficulty breathing, RTC and we will have you seen by GI for possible scope

## 2013-07-28 ENCOUNTER — Ambulatory Visit: Payer: 59 | Admitting: Nurse Practitioner

## 2013-08-18 ENCOUNTER — Ambulatory Visit (AMBULATORY_SURGERY_CENTER): Payer: 59

## 2013-08-18 ENCOUNTER — Encounter: Payer: Self-pay | Admitting: Internal Medicine

## 2013-08-18 VITALS — Ht 60.5 in | Wt 142.0 lb

## 2013-08-18 DIAGNOSIS — Z1211 Encounter for screening for malignant neoplasm of colon: Secondary | ICD-10-CM

## 2013-08-18 MED ORDER — MOVIPREP 100 G PO SOLR: 1.0000 | Freq: Once | ORAL | Status: DC

## 2013-08-22 ENCOUNTER — Ambulatory Visit (INDEPENDENT_AMBULATORY_CARE_PROVIDER_SITE_OTHER): Payer: 59 | Admitting: Nurse Practitioner

## 2013-08-22 ENCOUNTER — Encounter: Payer: Self-pay | Admitting: Nurse Practitioner

## 2013-08-22 VITALS — BP 116/66 | HR 72 | Ht 60.5 in | Wt 141.0 lb

## 2013-08-22 DIAGNOSIS — N813 Complete uterovaginal prolapse: Secondary | ICD-10-CM

## 2013-08-22 NOTE — Progress Notes (Signed)
Encounter reviewed by Dr. Romey Cohea Silva.  

## 2013-08-22 NOTE — Patient Instructions (Addendum)
Prolapse  Prolapse means the falling down, bulging, dropping, or drooping of a body part. Organs that commonly prolapse include the rectum, small intestine, bladder, urethra, vagina (birth canal), uterus (womb), and cervix. Prolapse occurs when the ligaments and muscle tissue around the rectum, bladder, and uterus are damaged or weakened.  CAUSES  This happens especially with:  Childbirth. Some women feel pelvic pressure or have trouble holding their urine right after childbirth, because of stretching and tearing of pelvic tissues. This generally gets better with time and the feeling usually goes away, but it may return with aging.  Chronic heavy lifting.  Aging.  Menopause, with loss of estrogen production weakening the pelvic ligaments and muscles.  Past pelvic surgery.  Obesity.  Chronic constipation.  Chronic cough. Prolapse may affect a single organ, or several organs may prolapse at the same time. The front wall of the vagina holds up the bladder. The back wall holds up part of the lower intestine, or rectum. The uterus fills a spot in the middle. All these organs can be involved when the ligaments and muscles around the vagina relax too much. This often gets worse when women stop producing estrogen (menopause). SYMPTOMS  Uncontrolled loss of urine (incontinence) with cough, sneeze, straining, and exercise.  More force may be required to have a bowel movement, due to trapping of the stool.  When part of an organ bulges through the opening of the vagina, there is sometimes a feeling of heaviness or pressure. It may feel as though something is falling out. This sensation increases with coughing or bearing down.  If the organs protrude through the opening of the vagina and rub against the clothing, there may be soreness, ulcers, infection, pain, and bleeding.  Lower back pain.  Pushing in the upper or lower part of the vagina, to pass urine or have a bowel movement.  Problems  having sexual intercourse.  Being unable to insert a tampon or applicator. DIAGNOSIS  Usually, a physical exam is all that is needed to identify the problem. During the examination, you may be asked to cough and strain while lying down, sitting up, and standing up. Your caregiver will determine if more testing is required, such as bladder function tests. Some diagnoses are:  Cystocele: Bulging and falling of the bladder into the top of the vagina.  Rectocele: Part of the rectum bulging into the vagina.  Prolapse of the uterus: The uterus falls or drops into the vagina.  Enterocele: Bulging of the top of the vagina, after a hysterectomy (uterus removal), with the small intestine bulging into the vagina. A hernia in the top of the vagina.  Urethrocele: The urethra (urine carrying tube) bulging into the vagina. TREATMENT  In most cases, prolapse needs to be treated only if it produces symptoms. If the symptoms are interfering with your usual daily or sexual activities, treatment may be necessary. The following are some measures that may be used to treat prolapse.  Estrogen may help elderly women with mild prolapse.  Kegel exercises may help mild cases of prolapse, by strengthening and tightening the muscles of the pelvic floor.  Pessaries are used in women who choose not to, or are unable to, have surgery. A pessary is a doughnut-shaped piece of plastic or rubber that is put into the vagina to keep the organs in place. This device must be fitted by your caregiver. Your caregiver will also explain how to care for yourself with the pessary. If it works well for you,   this may be the only treatment required.  Surgery is often the only form of treatment for more severe prolapses. There are different types of surgery available. You should discuss what the best procedure is for you. If the uterus is prolapsed, it may be removed (hysterectomy) as part of the surgical treatment. Your caregiver will  discuss the risks and benefits with you.  Uterine-vaginal suspension (surgery to hold up the organs) may be used, especially if you want to maintain your fertility. No form of treatment is guaranteed to correct the prolapse or relieve the symptoms. HOME CARE INSTRUCTIONS   Wear a sanitary pad or absorbent product if you have incontinence of urine.  Avoid heavy lifting and straining with exercise and work.  Take over-the-counter pain medicine for minor discomfort.  Try taking estrogen or using estrogen vaginal cream.  Try Kegel exercises or use a pessary, before deciding to have surgery.  Do Kegel exercises after having a baby. SEEK MEDICAL CARE IF:   Your symptoms interfere with your daily activities.  You need medicine to help with the discomfort.  You need to be fitted with a pessary.  You notice bleeding from the vagina.  You think you have ulcers or you notice ulcers on the cervix.  You have an oral temperature above 102 F (38.9 C).  You develop pain or blood with urination.  You have bleeding with a bowel movement.  The symptoms are interfering with your sex life.  You have urinary incontinence that interferes with your daily activities.  You lose urine with sexual intercourse.  You have a chronic cough.  You have chronic constipation. Document Released: 06/17/2003 Document Revised: 03/04/2012 Document Reviewed: 12/26/2009 [employer] Patient Information 2014 [employer].  

## 2013-08-22 NOTE — Progress Notes (Signed)
72 y.o. Married White female [id number] here for pessary check.  Patient has been using following pessary style and size:  6" ring pessary with support.  She is sexually active.  She describes the following issues with the pessary:  Recent BV infection and was treated with Metrogel. Symptoms of spotting is now improved and only spots with heavy lifting or straining for a BM.  ROS: No pelvic pain or discharge other than as noted,no breast pain or new or enlarging lumps on self exam, no dysuria, trouble voiding or hematuria.  She is having some anxiety issues with Job stressors and organization of everything.  Exam:   BP 116/66  Pulse 72  Ht 5' 0.5" (1.537 m)  Wt 141 lb (63.957 kg)  BMI 27.07 kg/m2 General appearance: alert, cooperative and appears stated age Inguinal adenopathy: negative   Pelvic: External genitalia:  no lesions              Urethra: normal appearing urethra with no masses, tenderness or lesions              Bartholin's and Skene's: normal                 Vagina: still areas of erythema on left side of vault at same areas previously treated with silver nitrate.  Areas again are treated X 2.               Cervix: absent Bimanual Exam:  Uterus:  absent                               Adnexa:    normal adnexa in size, non tender and no masses                               Anus:  defer exam  Pessary and Estring was removed without difficulty.  Pessary was cleansed.  Pessary was replaced. Patient tolerated procedure well.    A:  Cystocele- symptomatic       Granulation tissue at left vaginal vault with treatment again with Silver Nitrate.  Situational stressors  P:   Return to office in 3 months for recheck.        Call back if pelvic pain or pressure    Discussed use of anti anxiety med's that are short term but can be addictive vs. use of SSRI's for stress and anxiety.  She is concerned about weight gain and doesn't want to try that. She will try some relaxation exercises and  yoga to help with anxiety.  An After Visit Summary was printed and given to the patient.

## 2013-09-01 ENCOUNTER — Ambulatory Visit (AMBULATORY_SURGERY_CENTER): Payer: 59 | Admitting: Internal Medicine

## 2013-09-01 ENCOUNTER — Encounter: Payer: Self-pay | Admitting: Internal Medicine

## 2013-09-01 VITALS — BP 137/79 | HR 65 | Temp 96.9°F | Resp 21 | Ht 60.0 in | Wt 142.0 lb

## 2013-09-01 DIAGNOSIS — Z1211 Encounter for screening for malignant neoplasm of colon: Secondary | ICD-10-CM

## 2013-09-01 DIAGNOSIS — D126 Benign neoplasm of colon, unspecified: Secondary | ICD-10-CM

## 2013-09-01 DIAGNOSIS — K5730 Diverticulosis of large intestine without perforation or abscess without bleeding: Secondary | ICD-10-CM

## 2013-09-01 MED ORDER — SODIUM CHLORIDE 0.9 % IV SOLN: 500.0000 mL | INTRAVENOUS | Status: DC

## 2013-09-01 NOTE — Op Note (Signed)
[facility] [address] [place], [address]   COLONOSCOPY PROCEDURE REPORT  PATIENT: [name], [name]  MR#: [id number] BIRTHDATE: 06/13/1941 , 71  yrs. old GENDER: Female ENDOSCOPIST: Roxy Cedar, MD REFERRED XB:[id number] Recall, PROCEDURE DATE:  09/01/2013 PROCEDURE:   Colonoscopy with snare polypectomy x 1 First Screening Colonoscopy - Avg.  risk and is 50 yrs.  old or older - No.  Prior Negative Screening - Now for repeat screening. 10 or more years since last screening  History of Adenoma - Now for follow-up colonoscopy & has been > or = to 3 yrs.  N/A  Polyps Removed Today? Yes. ASA CLASS:   Class II INDICATIONS:average risk screening.   Negative index exam 06-2003 MEDICATIONS: MAC sedation, administered by CRNA and propofol (Diprivan) 300mg  IV  DESCRIPTION OF PROCEDURE:   After the risks benefits and alternatives of the procedure were thoroughly explained, informed consent was obtained.  A digital rectal exam revealed no abnormalities of the rectum.   The LB [id number]  endoscope was introduced through the anus and advanced to the cecum, which was identified by both the appendix and ileocecal valve. No adverse events experienced.   The quality of the prep was excellent, using MoviPrep  The instrument was then slowly withdrawn as the colon was fully examined.    COLON FINDINGS: Severe diverticulosis was noted throughout the entire examined colon.   A diminutive polyp was found in the ascending colon.  A polypectomy was performed with a cold snare. The resection was complete and the polyp tissue was completely retrieved.   The colon mucosa was otherwise normal.  Retroflexed views revealed no abnormalities. The time to cecum=9 minutes 0 seconds.  Withdrawal time=12 minutes 0 seconds.  The scope was withdrawn and the procedure completed. COMPLICATIONS: There were no complications.  ENDOSCOPIC IMPRESSION: 1.   Severe diverticulosis was  noted throughout the entire examined colon 2.   Diminutive polyp was found in the ascending colon; polypectomy was performed with a cold snare 3.   The colon mucosa was otherwise normal  RECOMMENDATIONS: Return to the care of your primary provider.  GI follow up as needed   eSigned:  Roxy Cedar, MD 09/01/2013 9:56 AM   cc: Jacques Navy, MD and The Patient   PATIENT NAME:  Gloria Werner, Gloria Werner MR#: [id number]

## 2013-09-01 NOTE — Patient Instructions (Addendum)
YOU HAD AN ENDOSCOPIC PROCEDURE TODAY AT THE [facility]: Refer to the procedure report that was given to you for any specific questions about what was found during the examination.  If the procedure report does not answer your questions, please call your gastroenterologist to clarify.  If you requested that your care partner not be given the details of your procedure findings, then the procedure report has been included in a sealed envelope for you to review at your convenience later.  YOU SHOULD EXPECT: Some feelings of bloating in the abdomen. Passage of more gas than usual.  Walking can help get rid of the air that was put into your GI tract during the procedure and reduce the bloating. If you had a lower endoscopy (such as a colonoscopy or flexible sigmoidoscopy) you may notice spotting of blood in your stool or on the toilet paper. If you underwent a bowel prep for your procedure, then you may not have a normal bowel movement for a few days.  DIET: Your first meal following the procedure should be a light meal and then it is ok to progress to your normal diet.  A half-sandwich or bowl of soup is an example of a good first meal.  Heavy or fried foods are harder to digest and may make you feel nauseous or bloated.  Likewise meals heavy in dairy and vegetables can cause extra gas to form and this can also increase the bloating.  Drink plenty of fluids but you should avoid alcoholic beverages for 24 hours.  ACTIVITY: Your care partner should take you home directly after the procedure.  You should plan to take it easy, moving slowly for the rest of the day.  You can resume normal activity the day after the procedure however you should NOT DRIVE or use heavy machinery for 24 hours (because of the sedation medicines used during the test).    SYMPTOMS TO REPORT IMMEDIATELY: A gastroenterologist can be reached at any hour.  During normal business hours, 8:30 AM to 5:00 PM Monday through Friday,  call [phone].  After hours and on weekends, please call the GI answering service at [phone] who will take a message and have the physician on call contact you.   Following lower endoscopy (colonoscopy or flexible sigmoidoscopy):  Excessive amounts of blood in the stool  Significant tenderness or worsening of abdominal pains  Swelling of the abdomen that is new, acute  Fever of 100F or higher  FOLLOW UP: If any biopsies were taken you will be contacted by phone or by letter within the next 1-3 weeks.  Call your gastroenterologist if you have not heard about the biopsies in 3 weeks.  Our staff will call the home number listed on your records the next business day following your procedure to check on you and address any questions or concerns that you may have at that time regarding the information given to you following your procedure. This is a courtesy call and so if there is no answer at the home number and we have not heard from you through the emergency physician on call, we will assume that you have returned to your regular daily activities without incident.  Please read over information about polyps, diverticulosis and high fiber diets  Continue your normal medications  Return to the care of your family doctor- follow up GI as needed SIGNATURES/CONFIDENTIALITY: You and/or your care partner have signed paperwork which will be entered into your electronic medical record.  These signatures attest to the fact that that the information above on your After Visit Summary has been reviewed and is understood.  Full responsibility of the confidentiality of this discharge information lies with you and/or your care-partner.

## 2013-09-01 NOTE — Progress Notes (Signed)
Called to room to assist during endoscopic procedure.  Patient ID and intended procedure confirmed with present staff. Received instructions for my participation in the procedure from the performing physician.  

## 2013-09-01 NOTE — Progress Notes (Signed)
Procedure ends, to recovery, report to [facility], Charity fundraiser and [facility].

## 2013-09-01 NOTE — Progress Notes (Signed)
Patient did not experience any of the following events: a burn prior to discharge; a fall within the facility; wrong site/side/patient/procedure/implant event; or a hospital transfer or hospital admission upon discharge from the facility. (G8907) Patient did not have preoperative order for IV antibiotic SSI prophylaxis. (G8918)  

## 2013-09-02 ENCOUNTER — Telehealth: Payer: Self-pay | Admitting: *Deleted

## 2013-09-02 NOTE — Telephone Encounter (Signed)
Message left

## 2013-09-04 ENCOUNTER — Encounter: Payer: Self-pay | Admitting: Internal Medicine

## 2013-10-01 ENCOUNTER — Other Ambulatory Visit: Payer: Self-pay | Admitting: Nurse Practitioner

## 2013-10-01 DIAGNOSIS — Z1231 Encounter for screening mammogram for malignant neoplasm of breast: Secondary | ICD-10-CM

## 2013-10-30 ENCOUNTER — Other Ambulatory Visit: Payer: Self-pay

## 2013-11-10 ENCOUNTER — Ambulatory Visit (HOSPITAL_COMMUNITY): Payer: 59

## 2013-11-25 ENCOUNTER — Telehealth: Payer: Self-pay | Admitting: Nurse Practitioner

## 2013-11-25 NOTE — Telephone Encounter (Signed)
Patient called and cancelled her appointment for her for 11/27/13 for a 3 month recheck with Shirlyn Goltz, NP. Patient states she will "skip this appointment and see Patty in January 2015" for her annual exam.

## 2013-11-27 ENCOUNTER — Ambulatory Visit: Payer: 59 | Admitting: Nurse Practitioner

## 2014-01-07 ENCOUNTER — Ambulatory Visit (HOSPITAL_COMMUNITY)
Admission: RE | Admit: 2014-01-07 | Discharge: 2014-01-07 | Disposition: A | Discharge status: Home / Self Care | Payer: 59 | Source: Ambulatory Visit | Attending: Nurse Practitioner | Admitting: Nurse Practitioner

## 2014-01-07 DIAGNOSIS — Z1231 Encounter for screening mammogram for malignant neoplasm of breast: Secondary | ICD-10-CM | POA: Insufficient documentation

## 2014-01-12 ENCOUNTER — Encounter: Payer: Self-pay | Admitting: Nurse Practitioner

## 2014-01-12 ENCOUNTER — Ambulatory Visit (INDEPENDENT_AMBULATORY_CARE_PROVIDER_SITE_OTHER): Payer: Medicare Other | Admitting: Nurse Practitioner

## 2014-01-12 VITALS — BP 132/90 | HR 68 | Ht 60.5 in | Wt 143.0 lb

## 2014-01-12 DIAGNOSIS — R82998 Other abnormal findings in urine: Secondary | ICD-10-CM

## 2014-01-12 DIAGNOSIS — N898 Other specified noninflammatory disorders of vagina: Secondary | ICD-10-CM

## 2014-01-12 DIAGNOSIS — Z01419 Encounter for gynecological examination (general) (routine) without abnormal findings: Secondary | ICD-10-CM

## 2014-01-12 DIAGNOSIS — Z0000 Encounter for general adult medical examination without abnormal findings: Secondary | ICD-10-CM

## 2014-01-12 DIAGNOSIS — L918 Other hypertrophic disorders of the skin: Secondary | ICD-10-CM

## 2014-01-12 DIAGNOSIS — R8290 Unspecified abnormal findings in urine: Secondary | ICD-10-CM

## 2014-01-12 LAB — POCT URINALYSIS DIPSTICK
Bilirubin, UA: NEGATIVE
Glucose, UA: NEGATIVE
KETONES UA: NEGATIVE
Nitrite, UA: NEGATIVE
PH UA: 8
PROTEIN UA: NEGATIVE
Urobilinogen, UA: NEGATIVE

## 2014-01-12 MED ORDER — CLOBETASOL PROPIONATE 0.05 % EX OINT: 1.0000 "application " | TOPICAL_OINTMENT | Freq: Two times a day (BID) | CUTANEOUS | Status: DC

## 2014-01-12 MED ORDER — ESTRADIOL 2 MG VA RING: 2.0000 mg | VAGINAL_RING | VAGINAL | Status: DC

## 2014-01-12 MED ORDER — VITAMIN D (ERGOCALCIFEROL) 1.25 MG (50000 UNIT) PO CAPS: 50000.0000 [IU] | ORAL_CAPSULE | ORAL | Status: DC

## 2014-01-12 NOTE — Patient Instructions (Signed)

## 2014-01-12 NOTE — Progress Notes (Signed)
Patient ID: Gloria Werner, female   DOB: 21-Jun-1941, 73 y.o.   MRN: [id number] 73 y.o. G5P2002 Married Caucasian Fe here for annual exam.  She has removed her pessary and Estring this am and did note a small spot of blood. The last time granulation tissue was treated was in August.  No LMP recorded. Patient has had a hysterectomy.          Sexually active: yes  The current method of family planning is status post hysterectomy.    Exercising: yes  Home exercise routine includes walking 3-4 times per week. Smoker:  no  Health Maintenance: Pap:  10/02/05, WNl MMG:  01/07/14, Bi-Rads 1: negative Colonoscopy:  09/01/13, polyp, repeat in 5 years BMD:  11/2011, 0.4/-1.7/0.7 TDaP:  10/22/12 Labs:  PCP Urine: trace RBC, 1+ WBC   reports that she has never smoked. She has never used smokeless tobacco. She reports that she does not drink alcohol or use illicit drugs.  Past Medical History  Diagnosis Date  . Allergic rhinitis due to pollen   . Trigger finger (acquired)   . Diverticulitis of colon (without mention of hemorrhage)   . Abdominal pain, left lower quadrant   . Irritable bowel syndrome   . Diverticulosis of colon (without mention of hemorrhage)   . Cystocele     uses pessary  . 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 Abdominal hysterectomy  1988    secondary to prolapse  . Tonsillectomy    . Nasal sinus surgery  1970  . Bladder suspension      A-P with Hyst    Current Outpatient Prescriptions  Medication Sig Dispense Refill  . calcium carbonate (OS-CAL) 600 MG TABS Take 600 mg by mouth 2 (two) times daily with a meal.        . clobetasol ointment (TEMOVATE) 4.09 % Apply 1 application topically 2 (two) times daily.  30 g  12  . estradiol (ESTRING) 2 MG vaginal ring Place 2 mg vaginally every 3 (three) months. follow package directions  3 each  3  . Multiple Vitamin (MULTIVITAMIN) tablet Take 1 tablet by mouth daily.        .  polyethylene glycol powder (GLYCOLAX/MIRALAX) powder Take 17 g by mouth daily.      . Pyridoxine HCl (B-6 PO) Take 1 tablet by mouth daily.      . vitamin C (ASCORBIC ACID) 500 MG tablet Take 500 mg by mouth daily.      . Vitamin D, Ergocalciferol, (DRISDOL) 50000 UNITS CAPS capsule Take 1 capsule (50,000 Units total) by mouth every 30 (thirty) days.  30 capsule  3   No current facility-administered medications for this visit.    Family History  Problem Relation Age of Onset  . Emphysema Mother   . Lymphoma Mother   . Asthma Mother   . Cancer Father 74    lung cancer  . Coronary artery disease Brother   . Cancer Brother     breast cancer  . Diabetes Neg Hx   . Hypertension Neg Hx   . Colon cancer Neg Hx   . Cancer Brother     lung cancer    ROS:  Pertinent items are noted in HPI.  Otherwise, a comprehensive ROS was negative.  Exam:   BP 132/90  Pulse 68  Ht 5' 0.5" (1.537 m)  Wt 143 lb (64.864 kg)  BMI 27.46 kg/m2 Height: 5' 0.5" (153.7 cm)  Ht Readings from Last 3 Encounters:  01/12/14 5' 0.5" (1.537 m)  09/01/13 5' (1.524 m)  08/22/13 5' 0.5" (1.537 m)    General appearance: alert, cooperative and appears stated age Head: Normocephalic, without obvious abnormality, atraumatic Neck: no adenopathy, supple, symmetrical, trachea midline and thyroid normal to inspection and palpation Lungs: clear to auscultation bilaterally Breasts: normal appearance, no masses or tenderness Heart: regular rate and rhythm Abdomen: soft, non-tender; no masses,  no organomegaly Extremities: extremities normal, atraumatic, no cyanosis or edema Skin: Skin color, texture, turgor normal. No rashes or lesions Lymph nodes: Cervical, supraclavicular, and axillary nodes normal. No abnormal inguinal nodes palpated Neurologic: Grossly normal   Pelvic: External genitalia:  Lesions of LSA flared on left lower labia majora.              Urethra:  normal appearing urethra with no masses, tenderness  or lesions              Bartholin's and Skene's: normal                 Vagina: normal appearing vagina with granulation tissue still noted at both side of vault left > than right.              Cervix: absent              Pap taken: no Bimanual Exam:  Uterus:  uterus absent              Adnexa: no mass, fullness, tenderness               Rectovaginal: Confirms               Anus:  normal sphincter tone, no lesions  TREATMENT NOTE:  Areas of granulation tissue was still friable with touching and probing.  The finger like projections were smaller than in the past.  Excoriation was worse on left than right.  2 Silver nitrate sticks were used and area became white / grey in color.  Patient tolerated procedure well. Afterwards a new Estring and her pessary was cleaned with Betadine and reinserted.  A:  Well Woman with normal exam  S/P Vaginal hysterectomy secondary to prolapse 1988  History of Enterocele with use of a Pessary for Cystocele # 6  ring with support  History of LSA  R/O UTI - most likely from pessary use  P:   Pap smear as per guidelines   Mammogram due 11/15  Refill on Estring and Temovate  Plan on rechecking in 3 months with regular pessary check earlier if any problems or symptoms.  Discussed use of ERT and risk of CVA, DVT, cancer, etc  Counseled on breast self exam, mammography screening, use and side effects of HRT, adequate intake of calcium and vitamin D, diet and exercise, Kegel's exercises return annually or prn  An After Visit Summary was printed and given to the patient.

## 2014-01-14 LAB — URINE CULTURE

## 2014-01-15 ENCOUNTER — Telehealth: Payer: Self-pay | Admitting: Nurse Practitioner

## 2014-01-15 NOTE — Telephone Encounter (Signed)
Patient calling about seeing her urinalysis results on MyChart and is concerned about bacteria. Please advise?

## 2014-01-15 NOTE — Telephone Encounter (Signed)
Spoke with patient. She saw culture results on Mychart and was concerned. Advised that Patty read culture as negative and that the specimen could have been contaminated, but that there was no bacteria that needed treatment. Patient denies uti symptoms. Denies dysuria, denies fevers, denies flank pain. States that has urinary frequency but that is not new for her. Advised if she is concerned she may come in for a retest, but patient declines. States she will see how she feels in one week and call back.  Routing to provider for final review. Patient agreeable to disposition. Will close encounter

## 2014-01-15 NOTE — Telephone Encounter (Signed)
Message left to return call to Gloria Werner at [phone].    

## 2014-01-15 NOTE — Progress Notes (Signed)
Encounter reviewed by Dr. Amylia Collazos Silva.  

## 2014-02-18 ENCOUNTER — Other Ambulatory Visit (INDEPENDENT_AMBULATORY_CARE_PROVIDER_SITE_OTHER): Payer: 59

## 2014-02-18 ENCOUNTER — Ambulatory Visit (INDEPENDENT_AMBULATORY_CARE_PROVIDER_SITE_OTHER): Payer: 59 | Admitting: Internal Medicine

## 2014-02-18 ENCOUNTER — Encounter: Payer: Self-pay | Admitting: Internal Medicine

## 2014-02-18 VITALS — BP 150/90 | HR 73 | Temp 97.2°F | Ht 61.5 in | Wt 142.0 lb

## 2014-02-18 DIAGNOSIS — Z23 Encounter for immunization: Secondary | ICD-10-CM

## 2014-02-18 DIAGNOSIS — Z136 Encounter for screening for cardiovascular disorders: Secondary | ICD-10-CM

## 2014-02-18 DIAGNOSIS — Z0000 Encounter for general adult medical examination without abnormal findings: Secondary | ICD-10-CM

## 2014-02-18 LAB — COMPREHENSIVE METABOLIC PANEL
ALT: 23 U/L (ref 0–35)
AST: 30 U/L (ref 0–37)
Albumin: 4.2 g/dL (ref 3.5–5.2)
Alkaline Phosphatase: 43 U/L (ref 39–117)
BILIRUBIN TOTAL: 0.5 mg/dL (ref 0.3–1.2)
BUN: 11 mg/dL (ref 6–23)
CALCIUM: 9.3 mg/dL (ref 8.4–10.5)
CHLORIDE: 104 meq/L (ref 96–112)
CO2: 27 meq/L (ref 19–32)
Creatinine, Ser: 0.6 mg/dL (ref 0.4–1.2)
GFR: 101.08 mL/min (ref >60.00)
GLUCOSE: 88 mg/dL (ref 70–99)
Potassium: 4.1 mEq/L (ref 3.5–5.1)
Sodium: 138 mEq/L (ref 135–145)
Total Protein: 7.5 g/dL (ref 6.0–8.3)

## 2014-02-18 LAB — LIPID PANEL
CHOLESTEROL: 210 mg/dL — AB (ref 0–200)
HDL: 52.5 mg/dL (ref >39.00)
TRIGLYCERIDES: 67 mg/dL (ref 0.0–149.0)
Total CHOL/HDL Ratio: 4
VLDL: 13.4 mg/dL (ref 0.0–40.0)

## 2014-02-18 LAB — CBC WITH DIFFERENTIAL/PLATELET
Basophils Absolute: 0 10*3/uL (ref 0.0–0.1)
Basophils Relative: 0.4 % (ref 0.0–3.0)
EOS ABS: 0.2 10*3/uL (ref 0.0–0.7)
Eosinophils Relative: 3.1 % (ref 0.0–5.0)
HCT: 43.2 % (ref 36.0–46.0)
Hemoglobin: 14.3 g/dL (ref 12.0–15.0)
LYMPHS PCT: 21.8 % (ref 12.0–46.0)
Lymphs Abs: 1.3 10*3/uL (ref 0.7–4.0)
MCHC: 33.1 g/dL (ref 30.0–36.0)
MCV: 90.6 fl (ref 78.0–100.0)
MONO ABS: 0.4 10*3/uL (ref 0.1–1.0)
Monocytes Relative: 7.4 % (ref 3.0–12.0)
NEUTROS PCT: 67.3 % (ref 43.0–77.0)
Neutro Abs: 3.9 10*3/uL (ref 1.4–7.7)
Platelets: 202 10*3/uL (ref 150.0–400.0)
RBC: 4.76 Mil/uL (ref 3.87–5.11)
RDW: 13.2 % (ref 11.5–14.6)
WBC: 5.8 10*3/uL (ref 4.5–10.5)

## 2014-02-18 LAB — TSH: TSH: 1.53 u[IU]/mL (ref 0.35–5.50)

## 2014-02-18 LAB — LDL CHOLESTEROL, DIRECT: LDL DIRECT: 140.5 mg/dL

## 2014-02-18 LAB — MAGNESIUM: Magnesium: 2.2 mg/dL (ref 1.5–2.5)

## 2014-02-18 NOTE — Progress Notes (Signed)
Subjective:    Patient ID: Gloria Werner, female    DOB: June 26, 1941, 73 y.o.   MRN: [id number]  HPI The patient is here for annual Medicare wellness examination and management of other chronic and acute problems.  Interval history - she has been to see [facility] for normal Gyn exam. She has been healthy.   The risk factors are reflected in the social history.  The roster of all physicians providing medical care to patient - is listed in the Snapshot section of the chart.  Activities of daily living:  The patient is 100% inedpendent in all ADLs: dressing, toileting, feeding as well as independent mobility  Home safety : The patient has smoke detectors in the home. Falls - none in the last year. They wear seatbelts.  firearms are present in the home, kept in a safe fashion. There is no violence in the home.   There is no risks for hepatitis, STDs or HIV. There is no history of blood transfusion. They have no travel history to infectious disease endemic areas of the world.  The patient has not seen their dentist in the last six month. They have  seen their eye doctor in the last year. They deny any hearing difficulty and have not had audiologic testing in the last year.    They do not  have excessive sun exposure. Discussed the need for sun protection: hats, long sleeves and use of sunscreen if there is significant sun exposure.   Diet: the importance of a healthy diet is discussed. They do have a healthy diet.  The patient has a regular exercise program: walking/aerobic class , 45-60 min duration, 4 per week.  The benefits of regular aerobic exercise were discussed.  Depression screen: there are no signs or vegative symptoms of depression- irritability, change in appetite, anhedonia, sadness/tearfullness.  Cognitive assessment: the patient manages all their financial and personal affairs and is actively engaged.   The following portions of the patient's history were reviewed  and updated as appropriate: allergies, current medications, past family history, past medical history,  past surgical history, past social history  and problem list.  Vision, hearing, body mass index were assessed and reviewed.   During the course of the visit the patient was educated and counseled about appropriate screening and preventive services including : fall prevention , diabetes screening, nutrition counseling, colorectal cancer screening, and recommended immunizations.  Annie Paras Current Outpatient Prescriptions on File Prior to Visit  Medication Sig Dispense Refill  . calcium carbonate (OS-CAL) 600 MG TABS Take 600 mg by mouth 2 (two) times daily with a meal.        . clobetasol ointment (TEMOVATE) [id number] % Apply 1 application topically 2 (two) times daily.  30 g  12  . estradiol (ESTRING) 2 MG vaginal ring Place 2 mg vaginally every 3 (three) months. follow package directions  3 each  3  . Multiple Vitamin (MULTIVITAMIN) tablet Take 1 tablet by mouth daily.        . polyethylene glycol powder (GLYCOLAX/MIRALAX) powder Take 17 g by mouth daily.      . Pyridoxine HCl (B-6 PO) Take 1 tablet by mouth daily.      . vitamin C (ASCORBIC ACID) 500 MG tablet Take 500 mg by mouth daily.      . Vitamin D, Ergocalciferol, (DRISDOL) 50000 UNITS CAPS capsule Take 1 capsule (50,000 Units total) by mouth every 30 (thirty) days.  30 capsule  3   No current facility-administered  medications on file prior to visit.     Review of Systems Constitutional:  Negative for fever, chills, activity change and unexpected weight change.  HEENT:  Negative for hearing loss, ear pain, congestion, neck stiffness and postnasal drip. Negative for sore throat or swallowing problems. Negative for dental complaints.   Eyes: Negative for vision loss or change in visual acuity.  Respiratory: Negative for chest tightness and wheezing. Negative for DOE.   Cardiovascular: Negative for chest pain or palpitations. No decreased  exercise tolerance Gastrointestinal: No change in bowel habit. No bloating or gas. No reflux or indigestion Genitourinary: Negative for urgency, frequency, flank pain and difficulty urinating. Has a pessary and does have urinary incontinence.  Musculoskeletal: Negative for myalgias, back pain, arthralgias and gait problem.  Neurological: Negative for dizziness, tremors, weakness and headaches.  Hematological: Negative for adenopathy.  Psychiatric/Behavioral: Negative for behavioral problems and dysphoric mood.       Objective:   Physical Exam Filed Vitals:   02/18/14 1010  BP: 150/90  Pulse: 73  Temp: 97.2 F (36.2 C)   Wt Readings from Last 3 Encounters:  02/18/14 142 lb (64.411 kg)  01/12/14 143 lb (64.864 kg)  09/01/13 142 lb (64.411 kg)   Gen'l: well nourished, well developed Woman in no distress HEENT - Impact/AT, EACs/TMs normal, oropharynx with native dentition in good condition, no buccal or palatal lesions, posterior pharynx clear, mucous membranes moist. C&S clear, PERRLA, fundi - normal Neck - supple, no thyromegaly Nodes- negative submental, cervical, supraclavicular regions Chest - no deformity, no CVAT Lungs - clear without rales, wheezes. No increased work of breathing Breast - deferred to Gyn and mammography Cardiovascular - regular rate and rhythm, quiet precordium, no murmurs, rubs or gallops, 2+ radial, DP and PT pulses Abdomen - BS+ x 4, no HSM, no guarding or rebound or tenderness Pelvic - deferred to gyn Rectal - deferred to gyn Extremities - no clubbing, cyanosis, edema or deformity.  Neuro - A&O x 3, CN II-XII normal, motor strength normal and equal, DTRs 2+ and symmetrical biceps, radial, and patellar tendons. Cerebellar - no tremor, no rigidity, fluid movement and normal gait. Derm - Head, neck, back, abdomen and extremities without suspicious lesions  Recent Results (from the past 2160 hour(s))  POCT URINALYSIS DIPSTICK     Status: Abnormal   Collection  Time    01/12/14  9:32 AM      Result Value Ref Range   Color, UA yellow     Clarity, UA clear     Glucose, UA neg     Bilirubin, UA neg     Ketones, UA neg     Spec Grav, UA       Blood, UA trace     pH, UA 8.0     Protein, UA neg     Urobilinogen, UA negative     Nitrite, UA neg     Leukocytes, UA small (1+)    URINE CULTURE     Status: None   Collection Time    01/12/14  9:46 AM      Result Value Ref Range   Colony Count 10,000 COLONIES/ML     Organism ID, Bacteria Multiple bacterial morphotypes present, none     Organism ID, Bacteria predominant. Suggest appropriate recollection if      Organism ID, Bacteria clinically indicated.    MAGNESIUM     Status: None   Collection Time    02/18/14 11:10 AM      Result  Value Ref Range   Magnesium 2.2  1.5 - 2.5 mg/dL  COMPREHENSIVE METABOLIC PANEL     Status: None   Collection Time    02/18/14 11:10 AM      Result Value Ref Range   Sodium 138  135 - 145 mEq/L   Potassium 4.1  3.5 - 5.1 mEq/L   Chloride 104  96 - 112 mEq/L   CO2 27  19 - 32 mEq/L   Glucose, Bld 88  70 - 99 mg/dL   BUN 11  6 - 23 mg/dL   Creatinine, Ser 0.6  0.4 - 1.2 mg/dL   Total Bilirubin 0.5  0.3 - 1.2 mg/dL   Alkaline Phosphatase 43  39 - 117 U/L   AST 30  0 - 37 U/L   ALT 23  0 - 35 U/L   Total Protein 7.5  6.0 - 8.3 g/dL   Albumin 4.2  3.5 - 5.2 g/dL   Calcium 9.3  8.4 - 10.5 mg/dL   GFR 101.08  >60.00 mL/min  LIPID PANEL     Status: Abnormal   Collection Time    02/18/14 11:10 AM      Result Value Ref Range   Cholesterol 210 (*) 0 - 200 mg/dL   Comment: ATP III Classification       Desirable:  < 200 mg/dL               Borderline High:  200 - 239 mg/dL          High:  > = 240 mg/dL   Triglycerides 67.0  0.0 - 149.0 mg/dL   Comment: Normal:  <150 mg/dLBorderline High:  150 - 199 mg/dL   HDL 52.50  >39.00 mg/dL   VLDL 13.4  0.0 - 40.0 mg/dL   Total CHOL/HDL Ratio 4     Comment:                Men          Women1/2 Average Risk     3.4           3.3Average Risk          5.0          4.42X Average Risk          9.6          7.13X Average Risk          15.0          11.0                      CBC WITH DIFFERENTIAL     Status: None   Collection Time    02/18/14 11:10 AM      Result Value Ref Range   WBC 5.8  4.5 - 10.5 K/uL   RBC 4.76  3.87 - 5.11 Mil/uL   Hemoglobin 14.3  12.0 - 15.0 g/dL   HCT 43.2  36.0 - 46.0 %   MCV 90.6  78.0 - 100.0 fl   MCHC 33.1  30.0 - 36.0 g/dL   RDW 13.2  11.5 - 14.6 %   Platelets 202.0  150.0 - 400.0 K/uL   Neutrophils Relative % 67.3  43.0 - 77.0 %   Lymphocytes Relative 21.8  12.0 - 46.0 %   Monocytes Relative 7.4  3.0 - 12.0 %   Eosinophils Relative 3.1  0.0 - 5.0 %   Basophils Relative 0.4  0.0 - 3.0 %  Neutro Abs 3.9  1.4 - 7.7 K/uL   Lymphs Abs 1.3  0.7 - 4.0 K/uL   Monocytes Absolute 0.4  0.1 - 1.0 K/uL   Eosinophils Absolute 0.2  0.0 - 0.7 K/uL   Basophils Absolute 0.0  0.0 - 0.1 K/uL  TSH     Status: None   Collection Time    02/18/14 11:10 AM      Result Value Ref Range   TSH 1.53  0.35 - 5.50 uIU/mL  LDL CHOLESTEROL, DIRECT     Status: None   Collection Time    02/18/14 11:10 AM      Result Value Ref Range   Direct LDL 140.5     Comment: Optimal:  <100 mg/dLNear or Above Optimal:  100-129 mg/dLBorderline High:  130-159 mg/dLHigh:  160-189 mg/dLVery High:  >190 mg/dL         Assessment & Plan:

## 2014-02-18 NOTE — Progress Notes (Signed)
Pre visit review using our clinic review tool, if applicable. No additional management support is needed unless otherwise documented below in the visit note. 

## 2014-02-18 NOTE — Patient Instructions (Signed)
Thanks for coming to see me.  Your exam is fine. Routine labs for today with results to MyChart  Immunizations - Prevnar pneumonia vaccine today - once and done.  Thank you for your trust and confidence over the years. Dr[facility] Chryl Heck at [facility] will do a very good job for you.

## 2014-02-19 NOTE — Assessment & Plan Note (Signed)
Interval hx unremarkable. Limited physical exam is normal. She is current with gyn, with colonoscopy and with mammography. Immunizations are up to date with Prevnar being given today.  In summary a nice woman who is medically stable. She will return PRN or in 1 year.

## 2014-02-25 ENCOUNTER — Emergency Department (HOSPITAL_BASED_OUTPATIENT_CLINIC_OR_DEPARTMENT_OTHER)
Admission: EM | Admit: 2014-02-25 | Discharge: 2014-02-25 | Disposition: A | Discharge status: Home / Self Care | Payer: Worker's Compensation | Attending: Emergency Medicine | Admitting: Emergency Medicine

## 2014-02-25 ENCOUNTER — Emergency Department (HOSPITAL_BASED_OUTPATIENT_CLINIC_OR_DEPARTMENT_OTHER): Payer: Worker's Compensation

## 2014-02-25 DIAGNOSIS — Y990 Civilian activity done for income or pay: Secondary | ICD-10-CM | POA: Insufficient documentation

## 2014-02-25 DIAGNOSIS — Y9389 Activity, other specified: Secondary | ICD-10-CM | POA: Insufficient documentation

## 2014-02-25 DIAGNOSIS — Z87448 Personal history of other diseases of urinary system: Secondary | ICD-10-CM | POA: Insufficient documentation

## 2014-02-25 DIAGNOSIS — S4980XA Other specified injuries of shoulder and upper arm, unspecified arm, initial encounter: Secondary | ICD-10-CM | POA: Insufficient documentation

## 2014-02-25 DIAGNOSIS — Z79899 Other long term (current) drug therapy: Secondary | ICD-10-CM | POA: Diagnosis not present

## 2014-02-25 DIAGNOSIS — Z8739 Personal history of other diseases of the musculoskeletal system and connective tissue: Secondary | ICD-10-CM | POA: Insufficient documentation

## 2014-02-25 DIAGNOSIS — S8000XA Contusion of unspecified knee, initial encounter: Secondary | ICD-10-CM | POA: Insufficient documentation

## 2014-02-25 DIAGNOSIS — Z872 Personal history of diseases of the skin and subcutaneous tissue: Secondary | ICD-10-CM | POA: Insufficient documentation

## 2014-02-25 DIAGNOSIS — Z8719 Personal history of other diseases of the digestive system: Secondary | ICD-10-CM | POA: Insufficient documentation

## 2014-02-25 DIAGNOSIS — R011 Cardiac murmur, unspecified: Secondary | ICD-10-CM | POA: Diagnosis not present

## 2014-02-25 DIAGNOSIS — S8990XA Unspecified injury of unspecified lower leg, initial encounter: Secondary | ICD-10-CM | POA: Diagnosis present

## 2014-02-25 DIAGNOSIS — S8002XA Contusion of left knee, initial encounter: Secondary | ICD-10-CM

## 2014-02-25 DIAGNOSIS — W010XXA Fall on same level from slipping, tripping and stumbling without subsequent striking against object, initial encounter: Secondary | ICD-10-CM | POA: Diagnosis not present

## 2014-02-25 DIAGNOSIS — Y9289 Other specified places as the place of occurrence of the external cause: Secondary | ICD-10-CM | POA: Insufficient documentation

## 2014-02-25 DIAGNOSIS — S46909A Unspecified injury of unspecified muscle, fascia and tendon at shoulder and upper arm level, unspecified arm, initial encounter: Secondary | ICD-10-CM | POA: Insufficient documentation

## 2014-02-25 MED ORDER — KETOROLAC TROMETHAMINE 15 MG/ML IJ SOLN
15.0000 mg | Freq: Once | INTRAMUSCULAR | Status: DC
  Filled 2014-02-25: qty 1

## 2014-02-25 MED ORDER — TRAMADOL HCL 50 MG PO TABS: 50.0000 mg | ORAL_TABLET | Freq: Four times a day (QID) | ORAL | Status: DC | PRN

## 2014-02-25 NOTE — Discharge Instructions (Signed)
Knee Immobilizer A knee immobilizer is used to support and protect an injured or painful knee. Knee immobilizers keep your knee from being used while it is healing. Some of the common immobilizers used include splints (air, plaster, fiberglass, stiff cloth, or aluminum) or casts. Wear your knee immobilizer as instructed and only remove it as instructed. HOME CARE INSTRUCTIONS   Use absorbent powder (such as baby powder or talcum powder) to control irritation from sweat and friction.  Adjust the immobilizer to be firm but not tight. Signs of an immobilizer that is too tight include:  Swelling.  Numbness.  Color change in your foot or ankle.  Increased pain.  While resting, raise your leg above the level of your heart. Pillows can be used for support. This reduces throbbing and helps healing.  Remove the immobilizer to bathe and sleep. SEEK MEDICAL CARE IF:   You have increasing pain or swelling in the knee, foot, or ankle.  You have problems caused by the knee immobilizer or it breaks or needs replacement. MAKE SURE YOU:   Understand these instructions.  Will watch your condition.  Will get help right away if you are not doing well or get worse. Document Released: 12/11/2005 Document Revised: 10/01/2013 Document Reviewed: 08/04/2013 [facility] Patient Information 2014 [place].

## 2014-02-25 NOTE — ED Provider Notes (Signed)
CSN: [id number]     Arrival date & time 02/25/14  1336 History   First MD Initiated Contact with Patient 02/25/14 1350     Chief Complaint  Patient presents with  . Knee Pain   HPI Gloria Werner is a 73 y.o. female presented to the ED after a fall at work yesterday. She reports she was carrying something and slipped on a tile floor, coming down on her left knee. She reports no other trauma occurred. Complains of mild muscle strain in left scapula area. She describes the pain  As pressure with sitting and deep ache with weight. She reports a small abrasion with the fall. She states it did become mildly swollen last night, but has since improved with ice application. She denies erythema. She has not taken anything for pain at home. She is able to ambulate with mild pain. She was going to take 1-2 days off to see how she was feeling and employer asked her to have it evaluated today in ED.   Past Medical History  Diagnosis Date  . Allergic rhinitis due to pollen   . Trigger finger (acquired)   . Diverticulitis of colon (without mention of hemorrhage)   . Abdominal pain, left lower quadrant   . Irritable bowel syndrome   . Diverticulosis of colon (without mention of hemorrhage)   . Cystocele     uses pessary  . 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 Abdominal hysterectomy  1988    secondary to prolapse  . Tonsillectomy    . Nasal sinus surgery  1970  . Bladder suspension      A-P with Hyst   Family History  Problem Relation Age of Onset  . Emphysema Mother   . Lymphoma Mother   . Asthma Mother   . Cancer Father 77    lung cancer  . Coronary artery disease Brother   . Cancer Brother     breast cancer  . Diabetes Neg Hx   . Hypertension Neg Hx   . Colon cancer Neg Hx   . Cancer Brother     lung cancer   History  Substance Use Topics  . Smoking status: Never Smoker   . Smokeless tobacco: Never Used  . Alcohol Use: No   OB  History   Grav Para Term Preterm Abortions TAB SAB Ect Mult Living   2 2 2       2      Review of Systems  Constitutional: Positive for activity change. Negative for fever and fatigue.  Musculoskeletal: Positive for arthralgias, gait problem, joint swelling and myalgias. Negative for back pain, neck pain and neck stiffness.  Neurological: Negative for dizziness, syncope and weakness.    Allergies  Codeine  Home Medications   Current Outpatient Rx  Name  Route  Sig  Dispense  Refill  . calcium carbonate (OS-CAL) 600 MG TABS   Oral   Take 600 mg by mouth 2 (two) times daily with a meal.           . clobetasol ointment (TEMOVATE) 0.05 %   Topical   Apply 1 application topically 2 (two) times daily.   30 g   12   . estradiol (ESTRING) 2 MG vaginal ring   Vaginal   Place 2 mg vaginally every 3 (three) months. follow package directions   3 each   3   . Multiple Vitamin (MULTIVITAMIN) tablet   Oral  Take 1 tablet by mouth daily.           . polyethylene glycol powder (GLYCOLAX/MIRALAX) powder   Oral   Take 17 g by mouth daily.         . Pyridoxine HCl (B-6 PO)   Oral   Take 1 tablet by mouth daily.         . traMADol (ULTRAM) 50 MG tablet   Oral   Take 1 tablet (50 mg total) by mouth every 6 (six) hours as needed for moderate pain.   30 tablet   0   . vitamin C (ASCORBIC ACID) 500 MG tablet   Oral   Take 500 mg by mouth daily.         . Vitamin D, Ergocalciferol, (DRISDOL) 50000 UNITS CAPS capsule   Oral   Take 1 capsule (50,000 Units total) by mouth every 30 (thirty) days.   30 capsule   3    BP 145/77  Pulse 86  Temp(Src) 98.9 F (37.2 C) (Oral)  Resp 18  Ht 5\' 1"  (1.549 m)  Wt 140 lb (63.504 kg)  BMI 26.47 kg/m2  SpO2 98% Physical Exam Gen: NAD.  HEENT: AT. [place]. Bilateral eyes without injections or icterus. MMM.  CV: RRR. Murmur appreciated.  Chest: CTAB, no wheeze or crackles. Abd: Soft. NTND. BS present . No Masses palpated.  Ext:  No erythema. No edema. Very mild swelling superior to knee. Mild abrasion and eccymosis on knee. TTP with patellar pressure. Negative anterior/posterior drawer. No ligament laxity or medial/lateral meniscus tenderness. Small effusion. Unable to bear total weight on left knee.  Skin: small abrasion mid-patella. ecchymosis present.  Neuro: Mildly unsteady gait. Mild limp. PERLA. EOMi. Alert. Grossly intact.  MSK: 5/5 muscle strength bilateral LE. Pain with flexion of left knee. Normal ROM with pain.   ED Course  Procedures (including critical care time) Labs Review Labs Reviewed - No data to display Imaging Review Dg Knee Complete 4 Views Left  02/25/2014   CLINICAL DATA:  Fall, anterior knee pain  EXAM: LEFT KNEE - COMPLETE 4+ VIEW  COMPARISON:  None.  FINDINGS: Normal alignment without acute fracture or effusion. Preserved joint spaces. Minor bony spurring about the left knee joint. No definite soft tissue abnormality.  IMPRESSION: No acute osseous finding.   Electronically Signed   By: Daryll Brod M.D.   On: 02/25/2014 14:34     EKG Interpretation None      MDM   Final diagnoses:  Contusion of knee, left   Patient presented with left knee pain after fall at work [employer]) yesterday. Pt was given Toradol x1 for pain. DG knee xray with no acute findings. Patient giving tramadol prescription for pain. Knee immobilizer was given. Patient reports no need for work excuse. Advised pt to follow up with her PCP within a week, or sooner if pain or swelling worsens.        Ma Hillock, DO 02/25/14 1451

## 2014-02-25 NOTE — ED Provider Notes (Signed)
I saw and evaluated the patient, reviewed the resident's note and I agree with the findings and plan.   EKG Interpretation None      Results for orders placed in visit on 02/18/14  MAGNESIUM      Result Value Ref Range   Magnesium 2.2  1.5 - 2.5 mg/dL  COMPREHENSIVE METABOLIC PANEL      Result Value Ref Range   Sodium 138  135 - 145 mEq/L   Potassium 4.1  3.5 - 5.1 mEq/L   Chloride 104  96 - 112 mEq/L   CO2 27  19 - 32 mEq/L   Glucose, Bld 88  70 - 99 mg/dL   BUN 11  6 - 23 mg/dL   Creatinine, Ser 0.6  0.4 - 1.2 mg/dL   Total Bilirubin 0.5  0.3 - 1.2 mg/dL   Alkaline Phosphatase 43  39 - 117 U/L   AST 30  0 - 37 U/L   ALT 23  0 - 35 U/L   Total Protein 7.5  6.0 - 8.3 g/dL   Albumin 4.2  3.5 - 5.2 g/dL   Calcium 9.3  8.4 - 10.5 mg/dL   GFR 101.08  >60.00 mL/min  LIPID PANEL      Result Value Ref Range   Cholesterol 210 (*) 0 - 200 mg/dL   Triglycerides 67.0  0.0 - 149.0 mg/dL   HDL 52.50  >39.00 mg/dL   VLDL 13.4  0.0 - 40.0 mg/dL   Total CHOL/HDL Ratio 4    CBC WITH DIFFERENTIAL      Result Value Ref Range   WBC 5.8  4.5 - 10.5 K/uL   RBC 4.76  3.87 - 5.11 Mil/uL   Hemoglobin 14.3  12.0 - 15.0 g/dL   HCT 43.2  36.0 - 46.0 %   MCV 90.6  78.0 - 100.0 fl   MCHC 33.1  30.0 - 36.0 g/dL   RDW 13.2  11.5 - 14.6 %   Platelets 202.0  150.0 - 400.0 K/uL   Neutrophils Relative % 67.3  43.0 - 77.0 %   Lymphocytes Relative 21.8  12.0 - 46.0 %   Monocytes Relative 7.4  3.0 - 12.0 %   Eosinophils Relative 3.1  0.0 - 5.0 %   Basophils Relative 0.4  0.0 - 3.0 %   Neutro Abs 3.9  1.4 - 7.7 K/uL   Lymphs Abs 1.3  0.7 - 4.0 K/uL   Monocytes Absolute 0.4  0.1 - 1.0 K/uL   Eosinophils Absolute 0.2  0.0 - 0.7 K/uL   Basophils Absolute 0.0  0.0 - 0.1 K/uL  TSH      Result Value Ref Range   TSH 1.53  0.35 - 5.50 uIU/mL  LDL CHOLESTEROL, DIRECT      Result Value Ref Range   Direct LDL 140.5     Dg Knee Complete 4 Views Left  02/25/2014   CLINICAL DATA:  Fall, anterior knee pain   EXAM: LEFT KNEE - COMPLETE 4+ VIEW  COMPARISON:  None.  FINDINGS: Normal alignment without acute fracture or effusion. Preserved joint spaces. Minor bony spurring about the left knee joint. No definite soft tissue abnormality.  IMPRESSION: No acute osseous finding.   Electronically Signed   By: Daryll Brod M.D.   On: 02/25/2014 14:34    Outpatient injury of the left knee at work on exam has a small abrasion no sniffing effusion x-rays of the knee are negative patient's of for self pedis pulses 2+. No  evidence of significant internal injury however we'll treat with knee immobilizer referral to orthopedics since it happened at work.  Mervin Kung, MD 02/25/14 [phone]

## 2014-02-25 NOTE — ED Notes (Signed)
Pt reports falling last night at work. Reports (L) knee pain since the fall.  Ambulatory without difficulty.

## 2014-04-23 ENCOUNTER — Telehealth: Payer: Self-pay | Admitting: Nurse Practitioner

## 2014-04-23 NOTE — Telephone Encounter (Signed)
Patient calling with a question about Estring. With new insurance it is too expensive. Is there a generic or another alternative?  Gloria Werner [phone]

## 2014-04-24 NOTE — Telephone Encounter (Signed)
Spoke with patient and advised that we had requested and authorization of coverage of the estring from [facility].  Patient would like to stay on estring if possible but cost has increased.  Faxed authorization to [employer] and will wait response.

## 2014-04-29 NOTE — Telephone Encounter (Signed)
Called [employer] to check on prior authorization. They state that rx is covered but at Tier 4, so cost is more. I requested that prior authorization be sent for tier exception request at this time. It was sent. Ref ID [id number] call back to [phone] option 1 to check status.

## 2014-05-01 NOTE — Telephone Encounter (Signed)
Called [employer] and spoke with rep JC. Tier Exception request was approved which placed the medication at a Tier 3 which would be $115.00 for the 90 day estring. They state that rx was just approved as of 0820 this morning and that test claims were not showing the new pricing yet, but would need 24 hours to get correct price.   Message left to return call to [place]ice at [phone] to discuss with patient.

## 2014-05-07 NOTE — Telephone Encounter (Signed)
Message left to return call to Stacee Earp at [phone].    

## 2014-05-11 ENCOUNTER — Ambulatory Visit (INDEPENDENT_AMBULATORY_CARE_PROVIDER_SITE_OTHER): Payer: 59 | Admitting: Internal Medicine

## 2014-05-11 ENCOUNTER — Encounter: Payer: Self-pay | Admitting: Internal Medicine

## 2014-05-11 VITALS — BP 122/72 | HR 74 | Temp 98.0°F | Wt 145.0 lb

## 2014-05-11 DIAGNOSIS — K5730 Diverticulosis of large intestine without perforation or abscess without bleeding: Secondary | ICD-10-CM

## 2014-05-11 DIAGNOSIS — IMO0002 Reserved for concepts with insufficient information to code with codable children: Secondary | ICD-10-CM

## 2014-05-11 DIAGNOSIS — N8111 Cystocele, midline: Secondary | ICD-10-CM

## 2014-05-11 DIAGNOSIS — K589 Irritable bowel syndrome without diarrhea: Secondary | ICD-10-CM

## 2014-05-11 DIAGNOSIS — J301 Allergic rhinitis due to pollen: Secondary | ICD-10-CM

## 2014-05-11 NOTE — Assessment & Plan Note (Signed)
Has a pessary Follows with gyn

## 2014-05-11 NOTE — Progress Notes (Signed)
Pre visit review using our clinic review tool, if applicable. No additional management support is needed unless otherwise documented below in the visit note. 

## 2014-05-11 NOTE — Assessment & Plan Note (Signed)
Controlled on nasocort Will continue for now

## 2014-05-11 NOTE — Progress Notes (Signed)
HPI Pt presents to the clinic today to establish care. She is transferring care from Dr. Linda Hedges. She has no concerns today.  Allergic Rhinitis: controlled on Nasocort  Diverticulosis: no recent flares, follows with Dr. Henrene Pastor  IBS: More constipation than diarrhea. Uses Miralax  Cystocele: Has a pessary  Lichen simplex: flares up around her anus. She uses temovate cream prn.  Past Medical History  Diagnosis Date  . Allergic rhinitis due to pollen   . Trigger finger (acquired)   . Diverticulitis of colon (without mention of hemorrhage)   . Abdominal pain, left lower quadrant   . Irritable bowel syndrome   . Diverticulosis of colon (without mention of hemorrhage)   . Cystocele     uses pessary  . 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 calcium carbonate (OS-CAL) 600 MG TABS Take 600 mg by mouth 2 (two) times daily with a meal.        . clobetasol ointment (TEMOVATE) 4.40 % Apply 1 application topically 2 (two) times daily.  30 g  12  . estradiol (ESTRING) 2 MG vaginal ring Place 2 mg vaginally every 3 (three) months. follow package directions  3 each  3  . Multiple Vitamin (MULTIVITAMIN) tablet Take 1 tablet by mouth daily.        . polyethylene glycol powder (GLYCOLAX/MIRALAX) powder Take 17 g by mouth daily.      . Pyridoxine HCl (B-6 PO) Take 1 tablet by mouth daily.      Marland Kitchen triamcinolone (NASACORT) 55 MCG/ACT AERO nasal inhaler Place 2 sprays into the nose daily.      . vitamin C (ASCORBIC ACID) 500 MG tablet Take 500 mg by mouth daily.      . Vitamin D, Ergocalciferol, (DRISDOL) 50000 UNITS CAPS capsule Take 1 capsule (50,000 Units total) by mouth every 30 (thirty) days.  30 capsule  3   No current facility-administered medications for this visit.    Allergies  Allergen Reactions  . Codeine Nausea And Vomiting    Family History  Problem Relation Age of Onset  . Emphysema Mother   . Lymphoma Mother   .  Asthma Mother   . Cancer Father 2    lung cancer  . Coronary artery disease Brother   . Cancer Brother     breast cancer  . Diabetes Neg Hx   . Hypertension Neg Hx   . Colon cancer Neg Hx   . Cancer Brother     lung cancer    History   Social History  . Marital Status: Married    Spouse Name: N/A    Number of Children: 2  . Years of Education: 12   Occupational History  . business Freight forwarder    Social History Main Topics  . Smoking status: Never Smoker   . Smokeless tobacco: Never Used  . Alcohol Use: No  . Drug Use: No  . Sexual Activity: Yes    Partners: Male    Birth Control/ Protection: Surgical   Other Topics Concern  . Not on file   Social History Narrative   HSG. Married '63. 1 son- 66'; 1-daughter-'68; grandchildren 3. 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 Marriage in good health. 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 Provided lead to [email or web address].    ROS:  Constitutional: Denies fever, malaise, fatigue, headache or abrupt weight changes.  Respiratory: Denies difficulty  breathing, shortness of breath, cough or sputum production.   Cardiovascular: Denies chest pain, chest tightness, palpitations or swelling in the hands or feet.  Gastrointestinal: Denies abdominal pain, bloating, constipation, diarrhea or blood in the stool.  GU: Denies frequency, urgency, pain with urination, blood in urine, odor or discharge.  No other specific complaints in a complete review of systems (except as listed in HPI above).  PE:  BP 122/72  Pulse 74  Temp(Src) 98 F (36.7 C) (Oral)  Wt 145 lb (65.772 kg)  SpO2 98% Wt Readings from Last 3 Encounters:  05/11/14 145 lb (65.772 kg)  02/25/14 140 lb (63.504 kg)  02/18/14 142 lb (64.411 kg)    General: Appears her stated age, well developed, well nourished in NAD. Cardiovascular: Normal rate and rhythm. S1,S2 noted.  No murmur, rubs or  gallops noted. No JVD or BLE edema. No carotid bruits noted. Pulmonary/Chest: Normal effort and positive vesicular breath sounds. No respiratory distress. No wheezes, rales or ronchi noted.  Abdomen: Soft and nontender. Normal bowel sounds, no bruits noted. No distention or masses noted. Liver, spleen and kidneys non palpable.   BMET    Component Value Date/Time   NA 138 02/18/2014 1110   K 4.1 02/18/2014 1110   CL 104 02/18/2014 1110   CO2 27 02/18/2014 1110   GLUCOSE 88 02/18/2014 1110   BUN 11 02/18/2014 1110   CREATININE 0.6 02/18/2014 1110   CALCIUM 9.3 02/18/2014 1110   GFRNONAA 90 02/26/2008 1019   GFRAA 108 02/26/2008 1019    Lipid Panel     Component Value Date/Time   CHOL 210* 02/18/2014 1110   TRIG 67.0 02/18/2014 1110   HDL 52.50 02/18/2014 1110   CHOLHDL 4 02/18/2014 1110   VLDL 13.4 02/18/2014 1110    CBC    Component Value Date/Time   WBC 5.8 02/18/2014 1110   RBC 4.76 02/18/2014 1110   HGB 14.3 02/18/2014 1110   HCT 43.2 02/18/2014 1110   PLT 202.0 02/18/2014 1110   MCV 90.6 02/18/2014 1110   MCHC 33.1 02/18/2014 1110   RDW 13.2 02/18/2014 1110   LYMPHSABS 1.3 02/18/2014 1110   MONOABS 0.4 02/18/2014 1110   EOSABS 0.2 02/18/2014 1110   BASOSABS 0.0 02/18/2014 1110    Hgb A1C No results found for this basename: HGBA1C     Assessment and Plan:

## 2014-05-11 NOTE — Patient Instructions (Addendum)
Allergic Rhinitis Allergic rhinitis is when the mucous membranes in the nose respond to allergens. Allergens are particles in the air that cause your body to have an allergic reaction. This causes you to release allergic antibodies. Through a chain of events, these eventually cause you to release histamine into the blood stream. Although meant to protect the body, it is this release of histamine that causes your discomfort, such as frequent sneezing, congestion, and an itchy, runny nose.  CAUSES  Seasonal allergic rhinitis (hay fever) is caused by pollen allergens that may come from grasses, trees, and weeds. Year-round allergic rhinitis (perennial allergic rhinitis) is caused by allergens such as house dust mites, pet dander, and mold spores.  SYMPTOMS   Nasal stuffiness (congestion).  Itchy, runny nose with sneezing and tearing of the eyes. DIAGNOSIS  Your health care provider can help you determine the allergen or allergens that trigger your symptoms. If you and your health care provider are unable to determine the allergen, skin or blood testing may be used. TREATMENT  Allergic Rhinitis does not have a cure, but it can be controlled by:  Medicines and allergy shots (immunotherapy).  Avoiding the allergen. Hay fever may often be treated with antihistamines in pill or nasal spray forms. Antihistamines block the effects of histamine. There are over-the-counter medicines that may help with nasal congestion and swelling around the eyes. Check with your health care provider before taking or giving this medicine.  If avoiding the allergen or the medicine prescribed do not work, there are many new medicines your health care provider can prescribe. Stronger medicine may be used if initial measures are ineffective. Desensitizing injections can be used if medicine and avoidance does not work. Desensitization is when a patient is given ongoing shots until the body becomes less sensitive to the allergen.  Make sure you follow up with your health care provider if problems continue. HOME CARE INSTRUCTIONS It is not possible to completely avoid allergens, but you can reduce your symptoms by taking steps to limit your exposure to them. It helps to know exactly what you are allergic to so that you can avoid your specific triggers. SEEK MEDICAL CARE IF:   You have a fever.  You develop a cough that does not stop easily (persistent).  You have shortness of breath.  You start wheezing.  Symptoms interfere with normal daily activities. Document Released: 09/05/2001 Document Revised: 10/01/2013 Document Reviewed: 08/18/2013 Exit[employer] Patient Information 2014 [employer].  

## 2014-05-11 NOTE — Assessment & Plan Note (Signed)
mirilax daily Follows with GI

## 2014-05-11 NOTE — Assessment & Plan Note (Signed)
No recent flares 

## 2014-05-13 NOTE — Telephone Encounter (Signed)
Spoke with patient to advise Estring was approved. Patient states she removed her pessary and estring in late April and has not been using either. She feels prolapse "isn't as bad" and she has been keeping her colon cleansed more regularly so she feels she isn't having as many issues. She wants to hold off for one more week on both pessary and estring and will call back for an appointment with Milford Cage, FNP if she feels she needs refill of estring and to place pessary again.   Routing to provider for final review. Patient agreeable to disposition. Will close encounter

## 2014-10-14 ENCOUNTER — Other Ambulatory Visit: Payer: Self-pay

## 2014-10-14 NOTE — Telephone Encounter (Signed)
Can we call her and ask her why she needs this. Is she travelling? Threasa Beards

## 2014-10-14 NOTE — Telephone Encounter (Signed)
This medication was in historical-never filled by you that i saw--please advise if okay to send in Rx--

## 2014-10-15 NOTE — Telephone Encounter (Signed)
Pt is going on a cruise, but pt states she is already out of town so not needed anymore

## 2014-10-21 ENCOUNTER — Telehealth: Payer: Self-pay | Admitting: Internal Medicine

## 2014-10-21 NOTE — Telephone Encounter (Signed)
Patient Information:  Caller Name: Eriyah  Phone: [phone]  Patient: Gloria Werner  Gender: Female  DOB: Oct 07, 1941  Age: 73 Years  PCP: Webb Silversmith  Office Follow Up:  Does the office need to follow up with this patient?: No  Instructions For The Office: N/A   Symptoms  Reason For Call & Symptoms: Pt reports she has productive cough, nasal congestion.  Reviewed Health History In EMR: Yes  Reviewed Medications In EMR: Yes  Reviewed Allergies In EMR: Yes  Reviewed Surgeries / Procedures: Yes  Date of Onset of Symptoms: 10/14/2014  Guideline(s) Used:  Cough  Disposition Per Guideline:   See Today or Tomorrow in Office  Reason For Disposition Reached:   Continuous (nonstop) coughing interferes with work or school and no improvement using cough treatment per Care Advice  Advice Given:  Reassurance  Coughing is the way that our lungs remove irritants and mucus. It helps protect our lungs from getting pneumonia.  You can get a dry hacking cough after a chest cold. Sometimes this type of cough can last 1-3 weeks, and be worse at night.  You can also get a cough after being exposed to irritating substances like smoke, strong perfumes, and dust.  Here is some care advice that should help.  Call Back If:  You become worse.  Patient Will Follow Care Advice:  YES  Appointment Scheduled:  10/22/2014 14:00:00 Appointment Scheduled Provider:  Webb Silversmith

## 2014-10-22 ENCOUNTER — Ambulatory Visit (INDEPENDENT_AMBULATORY_CARE_PROVIDER_SITE_OTHER): Payer: 59 | Admitting: Internal Medicine

## 2014-10-22 ENCOUNTER — Encounter: Payer: Self-pay | Admitting: Internal Medicine

## 2014-10-22 VITALS — BP 130/68 | HR 73 | Temp 98.4°F | Wt 143.5 lb

## 2014-10-22 DIAGNOSIS — J069 Acute upper respiratory infection, unspecified: Secondary | ICD-10-CM

## 2014-10-22 MED ORDER — AZITHROMYCIN 250 MG PO TABS: ORAL_TABLET | ORAL | Status: DC

## 2014-10-22 MED ORDER — BENZONATATE 200 MG PO CAPS: 200.0000 mg | ORAL_CAPSULE | Freq: Two times a day (BID) | ORAL | Status: DC | PRN

## 2014-10-22 NOTE — Patient Instructions (Addendum)
Upper Respiratory Infection, Adult An upper respiratory infection (URI) is also sometimes known as the common cold. The upper respiratory tract includes the nose, sinuses, throat, trachea, and bronchi. Bronchi are the airways leading to the lungs. Most people improve within 1 week, but symptoms can last up to 2 weeks. A residual cough may last even longer.  CAUSES Many different viruses can infect the tissues lining the upper respiratory tract. The tissues become irritated and inflamed and often become very moist. Mucus production is also common. A cold is contagious. You can easily spread the virus to others by oral contact. This includes kissing, sharing a glass, coughing, or sneezing. Touching your mouth or nose and then touching a surface, which is then touched by another person, can also spread the virus. SYMPTOMS  Symptoms typically develop 1 to 3 days after you come in contact with a cold virus. Symptoms vary from person to person. They may include:  Runny nose.  Sneezing.  Nasal congestion.  Sinus irritation.  Sore throat.  Loss of voice (laryngitis).  Cough.  Fatigue.  Muscle aches.  Loss of appetite.  Headache.  Low-grade fever. DIAGNOSIS  You might diagnose your own cold based on familiar symptoms, since most people get a cold 2 to 3 times a year. Your caregiver can confirm this based on your exam. Most importantly, your caregiver can check that your symptoms are not due to another disease such as strep throat, sinusitis, pneumonia, asthma, or epiglottitis. Blood tests, throat tests, and X-rays are not necessary to diagnose a common cold, but they may sometimes be helpful in excluding other more serious diseases. Your caregiver will decide if any further tests are required. RISKS AND COMPLICATIONS  You may be at risk for a more severe case of the common cold if you smoke cigarettes, have chronic heart disease (such as heart failure) or lung disease (such as asthma), or if  you have a weakened immune system. The very young and very old are also at risk for more serious infections. Bacterial sinusitis, middle ear infections, and bacterial pneumonia can complicate the common cold. The common cold can worsen asthma and chronic obstructive pulmonary disease (COPD). Sometimes, these complications can require emergency medical care and may be life-threatening. PREVENTION  The best way to protect against getting a cold is to practice good hygiene. Avoid oral or hand contact with people with cold symptoms. Wash your hands often if contact occurs. There is no clear evidence that vitamin C, vitamin E, echinacea, or exercise reduces the chance of developing a cold. However, it is always recommended to get plenty of rest and practice good nutrition. TREATMENT  Treatment is directed at relieving symptoms. There is no cure. Antibiotics are not effective, because the infection is caused by a virus, not by bacteria. Treatment may include:  Increased fluid intake. Sports drinks offer valuable electrolytes, sugars, and fluids.  Breathing heated mist or steam (vaporizer or shower).  Eating chicken soup or other clear broths, and maintaining good nutrition.  Getting plenty of rest.  Using gargles or lozenges for comfort.  Controlling fevers with ibuprofen or acetaminophen as directed by your caregiver.  Increasing usage of your inhaler if you have asthma. Zinc gel and zinc lozenges, taken in the first 24 hours of the common cold, can shorten the duration and lessen the severity of symptoms. Pain medicines may help with fever, muscle aches, and throat pain. A variety of non-prescription medicines are available to treat congestion and runny nose. Your caregiver   can make recommendations and may suggest nasal or lung inhalers for other symptoms.  HOME CARE INSTRUCTIONS   Only take over-the-counter or prescription medicines for pain, discomfort, or fever as directed by your  caregiver.  Use a warm mist humidifier or inhale steam from a shower to increase air moisture. This may keep secretions moist and make it easier to breathe.  Drink enough water and fluids to keep your urine clear or pale yellow.  Rest as needed.  Return to work when your temperature has returned to normal or as your caregiver advises. You may need to stay home longer to avoid infecting others. You can also use a face mask and careful hand washing to prevent spread of the virus. SEEK MEDICAL CARE IF:   After the first few days, you feel you are getting worse rather than better.  You need your caregiver's advice about medicines to control symptoms.  You develop chills, worsening shortness of breath, or brown or red sputum. These may be signs of pneumonia.  You develop yellow or brown nasal discharge or pain in the face, especially when you bend forward. These may be signs of sinusitis.  You develop a fever, swollen neck glands, pain with swallowing, or white areas in the back of your throat. These may be signs of strep throat. SEEK IMMEDIATE MEDICAL CARE IF:   You have a fever.  You develop severe or persistent headache, ear pain, sinus pain, or chest pain.  You develop wheezing, a prolonged cough, cough up blood, or have a change in your usual mucus (if you have chronic lung disease).  You develop sore muscles or a stiff neck. Document Released: 06/06/2001 Document Revised: 03/04/2012 Document Reviewed: 03/18/2014 ExitCare Patient Information 2015 [employer]. This information is not intended to replace advice given to you by your health care provider. Make sure you discuss any questions you have with your health care provider.  

## 2014-10-22 NOTE — Progress Notes (Signed)
HPI  Pt presents to the clinic today with c/o cough and chest congestion. She reports this started 1 week ago. The cough is productive of thick green/brown mucous. She has tried Sudafed and Ibuprofen with minimal relief. She does have a history of seasonal allergies. She takes Nasocort for that. She did recently go on a cruise where she was exposed to sick contacts.  Review of Systems      Past Medical History  Diagnosis Date  . Allergic rhinitis due to pollen   . Trigger finger (acquired)   . Diverticulitis of colon (without mention of hemorrhage)   . Abdominal pain, left lower quadrant   . Irritable bowel syndrome   . Diverticulosis of colon (without mention of hemorrhage)   . Cystocele     uses pessary  . 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 Emphysema Mother   . Lymphoma Mother   . Asthma Mother   . Cancer Father 50    lung cancer  . Coronary artery disease Brother   . Cancer Brother     breast cancer  . Diabetes Neg Hx   . Hypertension Neg Hx   . Colon cancer Neg Hx   . Cancer Brother     lung cancer    History   Social History  . Marital Status: Married    Spouse Name: N/A    Number of Children: 2  . Years of Education: 12   Occupational History  . business Freight forwarder    Social History Main Topics  . Smoking status: Never Smoker   . Smokeless tobacco: Never Used  . Alcohol Use: No  . Drug Use: No  . Sexual Activity: Yes    Partners: Male    Birth Control/ Protection: Surgical   Other Topics Concern  . Not on file   Social History Narrative   HSG. Married '63. 1 son- 71'; 1-daughter-'68; grandchildren 3. 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 Marriage in good health. 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 Provided lead to [email or web address].    Allergies  Allergen Reactions  . Codeine Nausea And Vomiting     Constitutional:   Denies headache, fatigue, fever or abrupt weight changes.  HEENT:  Positive nasal congestion, sore throat. Denies eye redness, eye pain, pressure behind the eyes, facial pain, ear pain, ringing in the ears, wax buildup, runny nose or bloody nose. Respiratory: Positive cough. Denies difficulty breathing or shortness of breath.  Cardiovascular: Denies chest pain, chest tightness, palpitations or swelling in the hands or feet.   No other specific complaints in a complete review of systems (except as listed in HPI above).  Objective:   BP 130/68  Pulse 73  Temp(Src) 98.4 F (36.9 C) (Oral)  Wt 143 lb 8 oz (65.091 kg)  SpO2 98% Wt Readings from Last 3 Encounters:  10/22/14 143 lb 8 oz (65.091 kg)  05/11/14 145 lb (65.772 kg)  02/25/14 140 lb (63.504 kg)     General: Appears her stated age, well developed, well nourished in NAD. HEENT: Head: normal shape and size; Mild maxillary sinus symptoms. Ears: Tm's gray and intact, normal light reflex; Nose: mucosa pink and moist, septum midline; Throat/Mouth: Teeth present, mucosa erythematous and moist, no exudate noted, no lesions or ulcerations noted.  Neck: No cervical lymphadenopathy. Neck supple, trachea midline. Cardiovascular: Normal rate and rhythm. S1,S2 noted.  No murmur,  rubs or gallops noted.  Pulmonary/Chest: Normal effort and positive vesicular breath sounds. No respiratory distress. No wheezes, rales or ronchi noted.      Assessment & Plan:   Upper Respiratory Infection:  Get some rest and drink plenty of water Do salt water gargles for the sore throat eRx for Azithromax x 5 days eRx for benzonate for cough  RTC as needed or if symptoms persist.

## 2014-10-22 NOTE — Progress Notes (Signed)
Pre visit review using our clinic review tool, if applicable. No additional management support is needed unless otherwise documented below in the visit note. 

## 2014-10-26 ENCOUNTER — Encounter: Payer: Self-pay | Admitting: Internal Medicine

## 2014-12-21 ENCOUNTER — Other Ambulatory Visit: Payer: Self-pay | Admitting: Nurse Practitioner

## 2014-12-21 DIAGNOSIS — Z1231 Encounter for screening mammogram for malignant neoplasm of breast: Secondary | ICD-10-CM

## 2015-01-08 ENCOUNTER — Other Ambulatory Visit: Payer: Self-pay | Admitting: Nurse Practitioner

## 2015-01-08 ENCOUNTER — Ambulatory Visit (HOSPITAL_COMMUNITY)
Admission: RE | Admit: 2015-01-08 | Discharge: 2015-01-08 | Disposition: A | Discharge status: Home / Self Care | Payer: Medicare Other | Source: Ambulatory Visit | Attending: Nurse Practitioner | Admitting: Nurse Practitioner

## 2015-01-08 DIAGNOSIS — Z1231 Encounter for screening mammogram for malignant neoplasm of breast: Secondary | ICD-10-CM

## 2015-01-14 ENCOUNTER — Encounter: Payer: Self-pay | Admitting: Nurse Practitioner

## 2015-01-14 ENCOUNTER — Ambulatory Visit (INDEPENDENT_AMBULATORY_CARE_PROVIDER_SITE_OTHER): Payer: Medicare Other | Admitting: Nurse Practitioner

## 2015-01-14 VITALS — BP 140/92 | HR 76 | Ht 60.5 in | Wt 143.0 lb

## 2015-01-14 DIAGNOSIS — M545 Low back pain: Secondary | ICD-10-CM | POA: Diagnosis not present

## 2015-01-14 DIAGNOSIS — R319 Hematuria, unspecified: Secondary | ICD-10-CM | POA: Diagnosis not present

## 2015-01-14 DIAGNOSIS — M9901 Segmental and somatic dysfunction of cervical region: Secondary | ICD-10-CM | POA: Diagnosis not present

## 2015-01-14 DIAGNOSIS — Z01419 Encounter for gynecological examination (general) (routine) without abnormal findings: Secondary | ICD-10-CM | POA: Diagnosis not present

## 2015-01-14 DIAGNOSIS — Z0000 Encounter for general adult medical examination without abnormal findings: Secondary | ICD-10-CM | POA: Diagnosis not present

## 2015-01-14 DIAGNOSIS — M9903 Segmental and somatic dysfunction of lumbar region: Secondary | ICD-10-CM | POA: Diagnosis not present

## 2015-01-14 DIAGNOSIS — E559 Vitamin D deficiency, unspecified: Secondary | ICD-10-CM | POA: Diagnosis not present

## 2015-01-14 DIAGNOSIS — M542 Cervicalgia: Secondary | ICD-10-CM | POA: Diagnosis not present

## 2015-01-14 LAB — POCT URINALYSIS DIPSTICK
BILIRUBIN UA: NEGATIVE
Glucose, UA: NEGATIVE
KETONES UA: NEGATIVE
Nitrite, UA: NEGATIVE
Protein, UA: NEGATIVE
Urobilinogen, UA: NEGATIVE
pH, UA: 5.5

## 2015-01-14 LAB — URINALYSIS, MICROSCOPIC ONLY
Bacteria, UA: NONE SEEN
CASTS: NONE SEEN
Crystals: NONE SEEN
RBC / HPF: NONE SEEN RBC/hpf (ref <3)

## 2015-01-14 MED ORDER — ESTRADIOL 2 MG VA RING: 2.0000 mg | VAGINAL_RING | VAGINAL | Status: DC

## 2015-01-14 MED ORDER — CLOBETASOL PROPIONATE 0.05 % EX OINT: 1.0000 "application " | TOPICAL_OINTMENT | Freq: Two times a day (BID) | CUTANEOUS | Status: DC

## 2015-01-14 MED ORDER — VITAMIN D (ERGOCALCIFEROL) 1.25 MG (50000 UNIT) PO CAPS: 50000.0000 [IU] | ORAL_CAPSULE | ORAL | Status: DC

## 2015-01-14 NOTE — Progress Notes (Signed)
Patient ID: Gloria Werner, female   DOB: 02/16/1941, 74 y.o.   MRN: [id number] 74 y.o. [id number] Married  Caucasian Fe here for annual exam.   Doing well.  She did note some pink to red vaginal staining on Sunday 01/10/15.  Grand daughter graduated from Nursing and got married this past summer.  Patient's last menstrual period was 12/25/1986.          Sexually active: Yes.    The current method of family planning is status post hysterectomy.    Exercising: Yes.    Home exercise routine includes walking 3-4 times per week. Smoker:  no  Health Maintenance: Pap:  10/02/05, negative  MMG:  01/08/15, 3D, Bi-Rads 1:  Negative  Colonoscopy:  09/01/13, adenomatous polyp, repeat in 5 years BMD:   11/2011, 0.4/-1.7/0.7 TDaP:  10/22/12 Shingles:  11/28/12 Prevnar: 02/18/14 Labs:  PCP  Urine:  Trace blood and leuk's    reports that she has never smoked. She has never used smokeless tobacco. She reports that she does not drink alcohol or use illicit drugs.  Past Medical History  Diagnosis Date  . Allergic rhinitis due to pollen   . Trigger finger (acquired)   . Diverticulitis of colon (without mention of hemorrhage)   . Abdominal pain, left lower quadrant   . Irritable bowel syndrome   . Diverticulosis of colon (without mention of hemorrhage)   . Cystocele     uses pessary  . 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 Abdominal hysterectomy  1988    secondary to prolapse  . Tonsillectomy    . Nasal sinus surgery  1970  . Bladder suspension      A-P with Hyst    Current Outpatient Prescriptions  Medication Sig Dispense Refill  . benzonatate (TESSALON) 200 MG capsule Take 1 capsule (200 mg total) by mouth 2 (two) times daily as needed for cough. 20 capsule 0  . calcium carbonate (OS-CAL) 600 MG TABS Take 600 mg by mouth 2 (two) times daily with a meal.      . clobetasol ointment (TEMOVATE) 2.54 % Apply 1 application topically 2 (two) times  daily. 30 g 12  . estradiol (ESTRING) 2 MG vaginal ring Place 2 mg vaginally every 3 (three) months. follow package directions 3 each 3  . Multiple Vitamin (MULTIVITAMIN) tablet Take 1 tablet by mouth daily.      . Pyridoxine HCl (B-6 PO) Take 1 tablet by mouth daily.    Marland Kitchen triamcinolone (NASACORT) 55 MCG/ACT AERO nasal inhaler Place 2 sprays into the nose daily.    . vitamin C (ASCORBIC ACID) 500 MG tablet Take 500 mg by mouth daily.    . Vitamin D, Ergocalciferol, (DRISDOL) 50000 UNITS CAPS capsule Take 1 capsule (50,000 Units total) by mouth every 30 (thirty) days. 30 capsule 3   No current facility-administered medications for this visit.    Family History  Problem Relation Age of Onset  . Emphysema Mother   . Lymphoma Mother   . Asthma Mother   . Cancer Father 13    lung cancer  . Coronary artery disease Brother   . Cancer Brother     breast cancer  . Diabetes Neg Hx   . Hypertension Neg Hx   . Colon cancer Neg Hx   . Cancer Brother     lung cancer    ROS:  Pertinent items are noted in HPI.  Otherwise, a comprehensive ROS was  negative.  Exam:   BP 140/92 mmHg  Pulse 76  Ht 5' 0.5" (1.537 m)  Wt 143 lb (64.864 kg)  BMI 27.46 kg/m2  LMP 12/25/1986 Height: 5' 0.5" (153.7 cm) Ht Readings from Last 3 Encounters:  01/14/15 5' 0.5" (1.537 m)  02/25/14 5\' 1"  (1.549 m)  02/18/14 5' 1.5" (1.562 m)    General appearance: alert, cooperative and appears stated age Head: Normocephalic, without obvious abnormality, atraumatic Neck: no adenopathy, supple, symmetrical, trachea midline and thyroid normal to inspection and palpation Lungs: clear to auscultation bilaterally Breasts: normal appearance, no masses or tenderness Heart: regular rate and rhythm Abdomen: soft, non-tender; no masses,  no organomegaly Extremities: extremities normal, atraumatic, no cyanosis or edema Skin: Skin color, texture, turgor normal. No rashes or lesions Lymph nodes: Cervical, supraclavicular, and  axillary nodes normal. No abnormal inguinal nodes palpated Neurologic: Grossly normal   Pelvic: External genitalia:  small red lesion at perianal area on the left at same area of previous biopsy.  No white patches just red.  She will apply Temovate until healed.              Urethra:  normal appearing urethra with no masses, tenderness.  There is a scratch mark at the urethra most likely from removal of pessary.  Not painful.              Bartholin's and Skene's: normal                 Vagina: posterior wall redness and granulation tissue multiple site treated again with silver nitrate.  After exam the Estring and Pessary is reinserted.               Cervix: absent              Pap taken: No. Bimanual Exam:  Uterus:  uterus absent              Adnexa: no mass, fullness, tenderness               Rectovaginal: Confirms               Anus:  normal sphincter tone, no lesions  Chaperone present: Yes  A:  Well Woman with normal exam  S/P Vaginal hysterectomy secondary to prolapse 1988 History of Enterocele with use of a Pessary for Cystocele # 6 Pessaryring with support History of LSA R/O UTI - most likely from pessary use and removal  Vaginal vault granulation tissue ongoing    P:   Reviewed health and wellness pertinent to exam  Pap smear not taken today  Mammogram is due 1/17  Refill on Temovate to use as directed for flare os LSA  Continued use of Pessary  Retreat vaginal cuff again in 6 months instead of waiting yearly  Refill on Estring for year  Counseled with risk of DVT, CVA, cancer, etc.  Counseled on breast self exam, mammography screening, use and side effects of HRT, adequate intake of calcium and vitamin D, diet and exercise, Kegel's exercises return annually or prn  An After Visit Summary was printed and given to the patient.

## 2015-01-14 NOTE — Patient Instructions (Addendum)

## 2015-01-15 LAB — URINE CULTURE

## 2015-01-17 NOTE — Progress Notes (Signed)
Reviewed personally.  M. Suzanne Karisa Nesser, MD.  

## 2015-01-18 ENCOUNTER — Telehealth: Payer: Self-pay | Admitting: Nurse Practitioner

## 2015-01-18 DIAGNOSIS — H10411 Chronic giant papillary conjunctivitis, right eye: Secondary | ICD-10-CM | POA: Diagnosis not present

## 2015-01-18 LAB — HM MAMMOGRAPHY: HM Mammogram: NORMAL

## 2015-01-18 NOTE — Telephone Encounter (Signed)
[place] and s/w Sharyn Lull she said the Clobetasol ointment would still be the same price, do you have any alternatives please advise.

## 2015-01-18 NOTE — Telephone Encounter (Signed)
She may have the generic clobetasol (Temovate) to use prn as directed.

## 2015-01-18 NOTE — Telephone Encounter (Signed)
Ms. Gloria Werner please advise, patient was seen for AEX 01/14/15.

## 2015-01-18 NOTE — Telephone Encounter (Signed)
Patient is calling to see if she can have a generic for the cream she was given last week. She states the prescription she was given cost $100.

## 2015-01-19 NOTE — Telephone Encounter (Signed)
Patient's pharmacy calling to check on status.

## 2015-01-20 NOTE — Telephone Encounter (Signed)
Tell pt I will have to check with Dr. Sabra Heck to see if another form of Temovate is available.  I will forward this to Dr, Sabra Heck as well.

## 2015-01-20 NOTE — Telephone Encounter (Signed)
Called patient and notified that Ms. Patty will check with Dr. Sabra Heck in regards to another form of temovate.

## 2015-01-21 MED ORDER — TRIAMCINOLONE ACETONIDE 0.5 % EX OINT: TOPICAL_OINTMENT | CUTANEOUS | Status: DC

## 2015-01-21 NOTE — Telephone Encounter (Signed)
Routed to refill pool.

## 2015-01-21 NOTE — Telephone Encounter (Signed)
Triamcinolone 0.5 % ointment #30 g/12 rfs sent to [facility] on [address].  Left Message To Call Back so i can notify patient.

## 2015-01-21 NOTE — Telephone Encounter (Signed)
Try triamcinolone 0.5% ointment with same directions.  BID for up to 7 days with flare.

## 2015-01-22 NOTE — Telephone Encounter (Signed)
Called patient and notified Rx for Triamcinolone ointment  Had been called in. Patient thanked me for calling her.

## 2015-03-01 ENCOUNTER — Encounter: Payer: Self-pay | Admitting: Internal Medicine

## 2015-03-05 ENCOUNTER — Ambulatory Visit (INDEPENDENT_AMBULATORY_CARE_PROVIDER_SITE_OTHER): Payer: Medicare Other | Admitting: Internal Medicine

## 2015-03-05 ENCOUNTER — Encounter: Payer: Self-pay | Admitting: Internal Medicine

## 2015-03-05 VITALS — BP 142/90 | HR 77 | Temp 98.1°F | Wt 143.0 lb

## 2015-03-05 DIAGNOSIS — F329 Major depressive disorder, single episode, unspecified: Secondary | ICD-10-CM | POA: Diagnosis not present

## 2015-03-05 DIAGNOSIS — F32A Depression, unspecified: Secondary | ICD-10-CM

## 2015-03-05 MED ORDER — ESCITALOPRAM OXALATE 10 MG PO TABS: 10.0000 mg | ORAL_TABLET | Freq: Every day | ORAL | Status: DC

## 2015-03-05 NOTE — Progress Notes (Signed)
Subjective:    Patient ID: Gloria Werner, female    DOB: [birth year], 74 y.o.   MRN: [id number]  HPI  Pt presents to the clinic today with c/o depression. She reports this started 2-3 weeks ago. Her and her husband are having marital problems. She is sleeping more, has had loss of appetite, lack of interest in doing things. She reports she has never had issues anxiety or depression in the past and never been treated for it. She does have a good support system of friends. She denies SI/HI.   Review of Systems      Past Medical History  Diagnosis Date  . Allergic rhinitis due to pollen   . Trigger finger (acquired)   . Diverticulitis of colon (without mention of hemorrhage)   . Abdominal pain, left lower quadrant   . Irritable bowel syndrome   . Diverticulosis of colon (without mention of hemorrhage)   . Cystocele     uses pessary  . 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 calcium carbonate (OS-CAL) 600 MG TABS Take 600 mg by mouth 2 (two) times daily with a meal.      . clobetasol ointment (TEMOVATE) 8.14 % Apply 1 application topically 2 (two) times daily. 30 g 12  . estradiol (ESTRING) 2 MG vaginal ring Place 2 mg vaginally every 3 (three) months. follow package directions 3 each 3  . Multiple Vitamin (MULTIVITAMIN) tablet Take 1 tablet by mouth daily.      . Pyridoxine HCl (B-6 PO) Take 1 tablet by mouth daily.    Marland Kitchen triamcinolone (NASACORT) 55 MCG/ACT AERO nasal inhaler Place 2 sprays into the nose daily.    Marland Kitchen triamcinolone ointment (KENALOG) 0.5 % Apply BID for up to 7 days with flare. 30 g 12  . vitamin C (ASCORBIC ACID) 500 MG tablet Take 500 mg by mouth daily.    . Vitamin D, Ergocalciferol, (DRISDOL) 50000 UNITS CAPS capsule Take 1 capsule (50,000 Units total) by mouth every 30 (thirty) days. 30 capsule 3   No current facility-administered medications for this visit.    Allergies  Allergen Reactions   . Codeine Nausea And Vomiting    Family History  Problem Relation Age of Onset  . Emphysema Mother   . Lymphoma Mother   . Asthma Mother   . Cancer Father 40    lung cancer  . Coronary artery disease Brother   . Cancer Brother     breast cancer  . Diabetes Neg Hx   . Hypertension Neg Hx   . Colon cancer Neg Hx   . Cancer Brother     lung cancer    History   Social History  . Marital Status: Married    Spouse Name: N/A  . Number of Children: 2  . Years of Education: 12   Occupational History  . business Freight forwarder    Social History Main Topics  . Smoking status: Never Smoker   . Smokeless tobacco: Never Used  . Alcohol Use: No  . Drug Use: No  . Sexual Activity:    Partners: Male    Birth Control/ Protection: Surgical   Other Topics Concern  . Not on file   Social History Narrative   HSG. Married '63. 1 son- 77'; 1-daughter-'68; grandchildren 3. 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 Marriage in good health. 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 Provided lead to [email or web address].     Constitutional: Denies fever, malaise, fatigue, headache or abrupt weight changes.  Respiratory: Denies difficulty breathing, shortness of breath, cough or sputum production.   Cardiovascular: Denies chest pain, chest tightness, palpitations or swelling in the hands or feet.  Neurological: Denies dizziness, difficulty with memory, difficulty with speech or problems with balance and coordination.  Psych: Pt reports depression. Denies anxiety, SI/HI.  No other specific complaints in a complete review of systems (except as listed in HPI above).  Objective:   Physical Exam  BP 142/90 mmHg  Pulse 77  Temp(Src) 98.1 F (36.7 C) (Oral)  Wt 143 lb (64.864 kg)  SpO2 98%  LMP 12/25/1986 Wt Readings from Last 3 Encounters:  03/05/15 143 lb (64.864 kg)  01/14/15 143 lb (64.864 kg)  10/22/14 143 lb 8 oz  (65.091 kg)    General: Appears her stated age, well developed, well nourished in NAD.  Cardiovascular: Normal rate and rhythm. S1,S2 noted.  No murmur, rubs or gallops noted. No JVD or BLE edema. No carotid bruits noted. Pulmonary/Chest: Normal effort and positive vesicular breath sounds. No respiratory distress. No wheezes, rales or ronchi noted.  Neurological: Alert and oriented.  Psychiatric: Mood normal but affect flat. Behavior is normal. Judgment and thought content normal.     BMET    Component Value Date/Time   NA 138 02/18/2014 1110   K 4.1 02/18/2014 1110   CL 104 02/18/2014 1110   CO2 27 02/18/2014 1110   GLUCOSE 88 02/18/2014 1110   BUN 11 02/18/2014 1110   CREATININE 0.6 02/18/2014 1110   CALCIUM 9.3 02/18/2014 1110   GFRNONAA 90 02/26/2008 1019   GFRAA 108 02/26/2008 1019    Lipid Panel     Component Value Date/Time   CHOL 210* 02/18/2014 1110   TRIG 67.0 02/18/2014 1110   HDL 52.50 02/18/2014 1110   CHOLHDL 4 02/18/2014 1110   VLDL 13.4 02/18/2014 1110    CBC    Component Value Date/Time   WBC 5.8 02/18/2014 1110   RBC 4.76 02/18/2014 1110   HGB 14.3 02/18/2014 1110   HCT 43.2 02/18/2014 1110   PLT 202.0 02/18/2014 1110   MCV 90.6 02/18/2014 1110   MCHC 33.1 02/18/2014 1110   RDW 13.2 02/18/2014 1110   LYMPHSABS 1.3 02/18/2014 1110   MONOABS 0.4 02/18/2014 1110   EOSABS 0.2 02/18/2014 1110   BASOSABS 0.0 02/18/2014 1110    Hgb A1C No results found for: HGBA1C      Assessment & Plan:

## 2015-03-05 NOTE — Assessment & Plan Note (Addendum)
Situational r/t marital trouble Support offered today Discussed different methods of treatment- therapy versus medication She is interested in trying an SSRI- RX for Lexapro given Discussed when to take drug, when to expect medication effect, common side effects including dizziness and drowsiness, and what to look for. Advised her that if she notices SI/HI, to stop medication and call me or 911 immediately ' RTC in 1 month to reevaluate

## 2015-03-05 NOTE — Patient Instructions (Signed)

## 2015-03-05 NOTE — Progress Notes (Signed)
Pre visit review using our clinic review tool, if applicable. No additional management support is needed unless otherwise documented below in the visit note. 

## 2015-03-08 ENCOUNTER — Encounter: Payer: Self-pay | Admitting: Internal Medicine

## 2015-03-25 ENCOUNTER — Telehealth: Payer: Self-pay | Admitting: Internal Medicine

## 2015-03-25 NOTE — Telephone Encounter (Signed)
[name]: Gloria Werner DOB: [birth year] Initial Comment Caller states she is taking a medication that is causing some issues and so she cut it back to an 1/2 pill. She is having diarrhea and a dull headache. Nurse Assessment Nurse: Erlene Quan, RN, Manuela Schwartz Date/Time Eilene Ghazi Time): 03/25/2015 9:55:55 AM Confirm and document reason for call. If symptomatic, describe symptoms. ---Caller states she is taking a medication that is causing some issues and so she cut it back to an 1/2 pill. She is having diarrhea and a dull headache - she is on her 3 week and when she cut back she still had diarrhea and dull headaches not every day but occasionally -states the diarrhea is severe - 6-0 loose stools a day - states her depression has gotten better - she dies not know if it is ok to cut the pill in half - states she is also having some light headedness and dizziness when she gets up Has the patient traveled out of the country within the last 30 days? ---Not Applicable Does the patient require triage? ---Yes Related visit to physician within the last 2 weeks? ---No Does the PT have any chronic conditions? (i.e. diabetes, asthma, etc.) ---Yes List chronic conditions. ---depression Guidelines Guideline Title Affirmed Question Affirmed Notes Diarrhea Age > 70 years Medication Question Call Caller has URGENT medication question about med that PCP prescribed and triager unable to answer question Final Disposition User Call PCP Now Erlene Quan, RN, Manuela Schwartz Comments Pt states she can not make it in the office today or tomorrow it would be sometime next week - advised her we really need to get her treated for this diarrhea she is having - that is severe diarrhea and she does risk dehydration - advised her it may not be the medication causing the diarrhea and dull headache she may have a virus -  states she will manage that she cant come in for appointment - advised her I can not recommend she cut a pill in half - this type medication may be altered if cut or broken she really needs to talk to the doctor before she continues to cut this medication adn take it - **Advised her I will send a message to the office and see if we can have someone call her back regarding the diarrhea and how she needs to proceed with the medication - she is on escitalopram 10 mg

## 2015-03-25 NOTE — Telephone Encounter (Signed)
Pt has CPX scheduled with Webb Silversmith NP on 04/08/15.

## 2015-03-26 NOTE — Telephone Encounter (Signed)
Please call to check on pt to see if she is feeing better and if we need to continue her current dose of lexapro.

## 2015-03-26 NOTE — Telephone Encounter (Signed)
Left message for Sharri to return my call

## 2015-04-08 ENCOUNTER — Ambulatory Visit (INDEPENDENT_AMBULATORY_CARE_PROVIDER_SITE_OTHER): Payer: Medicare Other | Admitting: Internal Medicine

## 2015-04-08 ENCOUNTER — Encounter: Payer: Self-pay | Admitting: Internal Medicine

## 2015-04-08 VITALS — BP 136/82 | HR 79 | Temp 98.0°F | Ht 60.5 in | Wt 144.0 lb

## 2015-04-08 DIAGNOSIS — F329 Major depressive disorder, single episode, unspecified: Secondary | ICD-10-CM | POA: Diagnosis not present

## 2015-04-08 DIAGNOSIS — R5383 Other fatigue: Secondary | ICD-10-CM | POA: Diagnosis not present

## 2015-04-08 DIAGNOSIS — Z79899 Other long term (current) drug therapy: Secondary | ICD-10-CM

## 2015-04-08 DIAGNOSIS — F32A Depression, unspecified: Secondary | ICD-10-CM

## 2015-04-08 DIAGNOSIS — N8110 Cystocele, unspecified: Secondary | ICD-10-CM

## 2015-04-08 DIAGNOSIS — K589 Irritable bowel syndrome without diarrhea: Secondary | ICD-10-CM

## 2015-04-08 DIAGNOSIS — Z0000 Encounter for general adult medical examination without abnormal findings: Secondary | ICD-10-CM | POA: Diagnosis not present

## 2015-04-08 DIAGNOSIS — M859 Disorder of bone density and structure, unspecified: Secondary | ICD-10-CM

## 2015-04-08 DIAGNOSIS — M8580 Other specified disorders of bone density and structure, unspecified site: Secondary | ICD-10-CM

## 2015-04-08 DIAGNOSIS — J309 Allergic rhinitis, unspecified: Secondary | ICD-10-CM

## 2015-04-08 DIAGNOSIS — L280 Lichen simplex chronicus: Secondary | ICD-10-CM | POA: Insufficient documentation

## 2015-04-08 DIAGNOSIS — IMO0002 Reserved for concepts with insufficient information to code with codable children: Secondary | ICD-10-CM

## 2015-04-08 LAB — VITAMIN B12: VITAMIN B 12: 454 pg/mL (ref 211–911)

## 2015-04-08 LAB — CBC
HCT: 41.2 % (ref 36.0–46.0)
HEMOGLOBIN: 14 g/dL (ref 12.0–15.0)
MCHC: 34.1 g/dL (ref 30.0–36.0)
MCV: 87.7 fl (ref 78.0–100.0)
PLATELETS: 180 10*3/uL (ref 150.0–400.0)
RBC: 4.7 Mil/uL (ref 3.87–5.11)
RDW: 14.2 % (ref 11.5–15.5)
WBC: 4.7 10*3/uL (ref 4.0–10.5)

## 2015-04-08 LAB — VITAMIN D 25 HYDROXY (VIT D DEFICIENCY, FRACTURES): VITD: 41.89 ng/mL (ref 30.00–100.00)

## 2015-04-08 LAB — COMPREHENSIVE METABOLIC PANEL
ALK PHOS: 50 U/L (ref 39–117)
ALT: 18 U/L (ref 0–35)
AST: 27 U/L (ref 0–37)
Albumin: 4.1 g/dL (ref 3.5–5.2)
BUN: 14 mg/dL (ref 6–23)
CALCIUM: 9.6 mg/dL (ref 8.4–10.5)
CO2: 30 mEq/L (ref 19–32)
Chloride: 105 mEq/L (ref 96–112)
Creatinine, Ser: 0.7 mg/dL (ref 0.40–1.20)
GFR: 87.58 mL/min (ref >60.00)
GLUCOSE: 87 mg/dL (ref 70–99)
Potassium: 4.4 mEq/L (ref 3.5–5.1)
Sodium: 140 mEq/L (ref 135–145)
Total Bilirubin: 0.5 mg/dL (ref 0.2–1.2)
Total Protein: 7.2 g/dL (ref 6.0–8.3)

## 2015-04-08 LAB — LIPID PANEL
CHOL/HDL RATIO: 3
Cholesterol: 191 mg/dL (ref 0–200)
HDL: 56.2 mg/dL (ref >39.00)
LDL Cholesterol: 124 mg/dL — ABNORMAL HIGH (ref 0–99)
NONHDL: 134.8
Triglycerides: 55 mg/dL (ref 0.0–149.0)
VLDL: 11 mg/dL (ref 0.0–40.0)

## 2015-04-08 MED ORDER — SERTRALINE HCL 25 MG PO TABS: 25.0000 mg | ORAL_TABLET | Freq: Every day | ORAL | Status: DC

## 2015-04-08 NOTE — Patient Instructions (Signed)

## 2015-04-08 NOTE — Assessment & Plan Note (Signed)
Chronic but stable Continue Fiber supplement Will check CBC and CMET today

## 2015-04-08 NOTE — Assessment & Plan Note (Signed)
Flares occasionally Continue clobetasol and triamcinolone cream as needed

## 2015-04-08 NOTE — Assessment & Plan Note (Signed)
Using pessary Continue to follow with GYN

## 2015-04-08 NOTE — Assessment & Plan Note (Signed)
Controlled on Nasocort

## 2015-04-08 NOTE — Assessment & Plan Note (Signed)
Somewhat improved She did not tolerate Lexapro She is interested in try Sertraline She will update me in 4 weeks and let me know how she is doing

## 2015-04-08 NOTE — Progress Notes (Signed)
HPI:  Pt presents to the clinic today for her annual medicare wellness exam. She is also here for 6 month followup of chronic medical conditions.  Allergic Rhinitis: Controlled. Takes Nasocort daily.  Depression: Worse over the last few months secondary to marital troubles. She was started on Lexapro 4 weeks ago. She reports shortly after starting the medication, she had diarrhea and headaches. She tried only taking 1/2 tablet but continued to have the same symptoms, so she stopped the medication all together. She did feel more relaxed and less depressed while taking the medication but was not able to handle the side effects. She denies SI/HI.  IBS: Alternating diarrhea and constipation. No recent issues. She takes a fiber supplement daily.  Cystocele: Uses a pessary.  Lichen simplex chronicus: Uses Clobetasol and Triamcinolone cream when it flares.  Past Medical History  Diagnosis Date  . Allergic rhinitis due to pollen   . Trigger finger (acquired)   . Diverticulitis of colon (without mention of hemorrhage)   . Abdominal pain, left lower quadrant   . Irritable bowel syndrome   . Diverticulosis of colon (without mention of hemorrhage)   . Cystocele     uses pessary  . 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 calcium carbonate (OS-CAL) 600 MG TABS Take 600 mg by mouth 2 (two) times daily with a meal.      . clobetasol ointment (TEMOVATE) 0.93 % Apply 1 application topically 2 (two) times daily. 30 g 12  . escitalopram (LEXAPRO) 10 MG tablet Take 1 tablet (10 mg total) by mouth at bedtime. 30 tablet 1  . estradiol (ESTRING) 2 MG vaginal ring Place 2 mg vaginally every 3 (three) months. follow package directions 3 each 3  . Multiple Vitamin (MULTIVITAMIN) tablet Take 1 tablet by mouth daily.      . Pyridoxine HCl (B-6 PO) Take 1 tablet by mouth daily.    Marland Kitchen triamcinolone (NASACORT) 55 MCG/ACT AERO nasal inhaler Place 2  sprays into the nose daily.    Marland Kitchen triamcinolone ointment (KENALOG) 0.5 % Apply BID for up to 7 days with flare. 30 g 12  . vitamin C (ASCORBIC ACID) 500 MG tablet Take 500 mg by mouth daily.    . Vitamin D, Ergocalciferol, (DRISDOL) 50000 UNITS CAPS capsule Take 1 capsule (50,000 Units total) by mouth every 30 (thirty) days. 30 capsule 3   No current facility-administered medications for this visit.    Allergies  Allergen Reactions  . Codeine Nausea And Vomiting    Family History  Problem Relation Age of Onset  . Emphysema Mother   . Lymphoma Mother   . Asthma Mother   . Cancer Father 86    lung cancer  . Coronary artery disease Brother   . Cancer Brother     breast cancer  . Diabetes Neg Hx   . Hypertension Neg Hx   . Colon cancer Neg Hx   . Cancer Brother     lung cancer    History   Social History  . Marital Status: Married    Spouse Name: N/A  . Number of Children: 2  . Years of Education: 12   Occupational History  . business Freight forwarder    Social History Main Topics  . Smoking status: Never Smoker   . Smokeless tobacco: Never Used  . Alcohol Use: No  . Drug Use: No  . Sexual Activity:    Partners: Male  Birth Control/ Protection: Surgical   Other Topics Concern  . Not on file   Social History Narrative   HSG. Married '63. 1 son- 12'; 1-daughter-'68; grandchildren 3. 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 Marriage in good health. 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 Provided lead to [email or web address].    Hospitiliaztions: None  Health Maintenance:    Flu: 08/2014  Tetanus: 09/2012  Pneumovax: 09/2012  Prevnar: 01/2014  Zostavax: 11/2012  Mammogram: 12/2014  Pap Smear: 2006 s/p hysterectomy  Bone Density: 11/2011  Colonoscopy: 08/2013 (5 years)  Eye Doctor: 10/2014  Dental Exam: biannually  Providers:   PCP: Webb Silversmith, NP-C  GYN: Milford Cage,  FNP  Gastroenterology: Dr. Henrene Pastor  Opthalmologist: Dr. Katy Fitch   I have personally reviewed and have noted:  1. The patient's medical and social history 2. Their use of alcohol, tobacco or illicit drugs 3. Their current medications and supplements 4. The patient's functional ability including ADL's, fall risks, home safety risks and hearing or visual impairment. 5. Diet and physical activities 6. Evidence for depression or mood disorder  Subjective:   Review of Systems:   Constitutional: Pt reports fatigue. Denies fever, malaise, headache or abrupt weight changes.  HEENT: Denies eye pain, eye redness, ear pain, ringing in the ears, wax buildup, runny nose, nasal congestion, bloody nose, or sore throat. Respiratory: Denies difficulty breathing, shortness of breath, cough or sputum production.   Cardiovascular: Denies chest pain, chest tightness, palpitations or swelling in the hands or feet.  Gastrointestinal: Denies abdominal pain, bloating, constipation, diarrhea or blood in the stool.  GU: Denies urgency, frequency, pain with urination, burning sensation, blood in urine, odor or discharge. Musculoskeletal: Denies decrease in range of motion, difficulty with gait, muscle pain or joint pain and swelling.  Skin: Denies redness, rashes, lesions or ulcercations.  Neurological: Denies dizziness, difficulty with memory, difficulty with speech or problems with balance and coordination.  Psych: Pt reports depression. Denies anxiety, SI/HI.  No other specific complaints in a complete review of systems (except as listed in HPI above).  Objective:  PE:   BP 136/82 mmHg  Pulse 79  Temp(Src) 98 F (36.7 C) (Oral)  Ht 5' 0.5" (1.537 m)  Wt 144 lb (65.318 kg)  BMI 27.65 kg/m2  SpO2 99%  LMP 12/25/1986  Wt Readings from Last 3 Encounters:  03/05/15 143 lb (64.864 kg)  01/14/15 143 lb (64.864 kg)  10/22/14 143 lb 8 oz (65.091 kg)    General: Appears her stated age, well developed, well  nourished in NAD. Skin: Warm, dry and intact. No rashes, lesions or ulcerations noted. HEENT: Head: normal shape and size; Nose: mucosa pink and moist, septum midline; Throat/Mouth: Teeth present, mucosa pink and moist, no exudate, lesions or ulcerations noted.  Cardiovascular: Normal rate and rhythm. S1,S2 noted.  No murmur, rubs or gallops noted. No JVD or BLE edema. No carotid bruits noted. Pulmonary/Chest: Normal effort and positive vesicular breath sounds. No respiratory distress. No wheezes, rales or ronchi noted.  Abdomen: Soft and nontender. Normal bowel sounds, no bruits noted. No distention or masses noted. Liver, spleen and kidneys non palpable. Neurological: Alert and oriented.  Psychiatric: Mood and affect normal. Behavior is normal. Judgment and thought content normal.    BMET    Component Value Date/Time   NA 138 02/18/2014 1110   K 4.1 02/18/2014 1110   CL 104 02/18/2014 1110   CO2 27 02/18/2014 1110  GLUCOSE 88 02/18/2014 1110   BUN 11 02/18/2014 1110   CREATININE 0.6 02/18/2014 1110   CALCIUM 9.3 02/18/2014 1110   GFRNONAA 90 02/26/2008 1019   GFRAA 108 02/26/2008 1019    Lipid Panel     Component Value Date/Time   CHOL 210* 02/18/2014 1110   TRIG 67.0 02/18/2014 1110   HDL 52.50 02/18/2014 1110   CHOLHDL 4 02/18/2014 1110   VLDL 13.4 02/18/2014 1110    CBC    Component Value Date/Time   WBC 5.8 02/18/2014 1110   RBC 4.76 02/18/2014 1110   HGB 14.3 02/18/2014 1110   HCT 43.2 02/18/2014 1110   PLT 202.0 02/18/2014 1110   MCV 90.6 02/18/2014 1110   MCHC 33.1 02/18/2014 1110   RDW 13.2 02/18/2014 1110   LYMPHSABS 1.3 02/18/2014 1110   MONOABS 0.4 02/18/2014 1110   EOSABS 0.2 02/18/2014 1110   BASOSABS 0.0 02/18/2014 1110    Hgb A1C No results found for: HGBA1C    Assessment and Plan:   Medicare Annual Wellness Visit:  Diet: Balanced meals, tries to avoid fried foods Physical activity: Walks on treadmill, elliptical 2 days a  week Depression/mood screen: Positive, will try SSRI Hearing: Intact to whispered voice Visual acuity: Grossly normal, performs annual eye exam  ADLs: Capable Fall risk: None Home safety: Good Cognitive evaluation: Intact to orientation, naming, recall and repetition EOL planning: Living Will, full code/ I agree  Preventative Medicine: All UTD, will need bone density next year  Next appointment: Update me in 4 weeks on how your are doing with Sertraline, 1 year followup for wellness

## 2015-04-08 NOTE — Assessment & Plan Note (Signed)
Bone density due next year Will check Vit D today

## 2015-04-08 NOTE — Progress Notes (Signed)
Pre visit review using our clinic review tool, if applicable. No additional management support is needed unless otherwise documented below in the visit note. 

## 2015-04-12 DIAGNOSIS — M9903 Segmental and somatic dysfunction of lumbar region: Secondary | ICD-10-CM | POA: Diagnosis not present

## 2015-04-12 DIAGNOSIS — M542 Cervicalgia: Secondary | ICD-10-CM | POA: Diagnosis not present

## 2015-04-12 DIAGNOSIS — M9901 Segmental and somatic dysfunction of cervical region: Secondary | ICD-10-CM | POA: Diagnosis not present

## 2015-04-12 DIAGNOSIS — M545 Low back pain: Secondary | ICD-10-CM | POA: Diagnosis not present

## 2015-05-26 DIAGNOSIS — C44311 Basal cell carcinoma of skin of nose: Secondary | ICD-10-CM | POA: Diagnosis not present

## 2015-05-26 DIAGNOSIS — D225 Melanocytic nevi of trunk: Secondary | ICD-10-CM | POA: Diagnosis not present

## 2015-05-26 DIAGNOSIS — X32XXXD Exposure to sunlight, subsequent encounter: Secondary | ICD-10-CM | POA: Diagnosis not present

## 2015-05-26 DIAGNOSIS — L570 Actinic keratosis: Secondary | ICD-10-CM | POA: Diagnosis not present

## 2015-06-29 DIAGNOSIS — Z08 Encounter for follow-up examination after completed treatment for malignant neoplasm: Secondary | ICD-10-CM | POA: Diagnosis not present

## 2015-06-29 DIAGNOSIS — Z85828 Personal history of other malignant neoplasm of skin: Secondary | ICD-10-CM | POA: Diagnosis not present

## 2015-06-29 DIAGNOSIS — L570 Actinic keratosis: Secondary | ICD-10-CM | POA: Diagnosis not present

## 2015-06-29 DIAGNOSIS — X32XXXD Exposure to sunlight, subsequent encounter: Secondary | ICD-10-CM | POA: Diagnosis not present

## 2015-07-12 ENCOUNTER — Ambulatory Visit: Payer: Medicare Other | Admitting: Nurse Practitioner

## 2015-07-12 ENCOUNTER — Encounter: Payer: Self-pay | Admitting: Nurse Practitioner

## 2015-07-12 ENCOUNTER — Ambulatory Visit (INDEPENDENT_AMBULATORY_CARE_PROVIDER_SITE_OTHER): Payer: Medicare Other | Admitting: Nurse Practitioner

## 2015-07-12 VITALS — BP 138/82 | HR 76 | Ht 60.5 in | Wt 148.0 lb

## 2015-07-12 DIAGNOSIS — N898 Other specified noninflammatory disorders of vagina: Secondary | ICD-10-CM | POA: Diagnosis not present

## 2015-07-12 NOTE — Progress Notes (Signed)
74 y.o. MW  female [phone] here for follow up of granulation tissue that has been treated with silver nitrate for several years.  Treatment was initiated in 12/ 2012 by Dr. Lenda Kelp and continued every 6-12 months.  She wears a 6" ring pessary with Estring for vaginal support following post hysterectomy procidentia. She has concerns about making sure nothing else is wrong.  She normally only notes spotting with a BM if it is harder than usual, however if she is lifting or pulling will also spot.  She is able to remove her pessary and Estring as needed.  She is SA but is rare now, this does not cause her any problems with pain or spotting.  She has removed her pessary and Estring prior to coming in today.   O: Healthy WD,WN female Affect: normal Skin: warm and dry Abdomen: soft and non tender without mass Pelvic exam: cervix and uterus is absent.  There remains granulation tissue on the left and superior aspects.  But in general the tissue is more pink most likely from the pessary being in place.  Since area looks more irritated than usual I have asked Dr. Sabra Heck to look at the granulation tissue.  She feels the granulation tissue being treated could be causing the tissue to remain always irritated.  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    P: Discussed findings of chronic irritation and maybe a change to a softer pessary might be helpful.  Will not treat with silver nitrate at this time and see if symptoms get better / worse with a  softer pessary.   Instructions given regarding:  We will order the pessary and call her when it comes in and she can just come and pick it up - does not have to be seen for this.  She will then try it for 2-4 weeks and call back with a progress report of use and if any bleeding. RV

## 2015-07-13 NOTE — Progress Notes (Signed)
Reviewed personally.  M. Suzanne Aleynah Rocchio, MD.  

## 2015-07-16 ENCOUNTER — Telehealth: Payer: Self-pay | Admitting: *Deleted

## 2015-07-16 NOTE — Telephone Encounter (Signed)
Pessary order has arrived. Call to patient home and cell number. Left message to call back. Per Patty patient may come to office to pick up pessary at her convenience.

## 2015-07-19 ENCOUNTER — Telehealth: Payer: Self-pay | Admitting: Nurse Practitioner

## 2015-07-19 NOTE — Telephone Encounter (Signed)
Spoke with patient. Patient states that she picked up her pessary this morning and was under the impression the new pessary she was getting would be softer than her last. "I think this pessary is larger than the one I had before and it seems to be just as hard." Patient has not opened pessary in fear it is the wrong kind. States package reads [phone] #6. Advised per OV from Milford Cage, FNP was supposed to receive a #6 pessary ring with support. Advised I will need to speak with Milford Cage, FNP and Lamont Snowball, RN regarding pessary to ensure this is the right one and return call. Patient is agreeable.

## 2015-07-19 NOTE — Telephone Encounter (Signed)
Without taking out of the package. Please have her to see how the size is different.  Measure hers from outside rim to rim.  Then measure the new one in the package from rim to rim.

## 2015-07-19 NOTE — Telephone Encounter (Signed)
Patinet has some questions regarding her pessary. Last seen 07/12/15.

## 2015-07-19 NOTE — Telephone Encounter (Signed)
Patient returned call. She is advised of message from Lamont Snowball, RN. She has questions about cost of pessary. Advised would have someone from insurance department return her call with cvoerage. Patient agreeable.   Routing to provider for final review. Patient agreeable to disposition. Will close encounter.

## 2015-07-19 NOTE — Telephone Encounter (Signed)
Spoke with patient. Patient measure new pessary which is 3 inches from rim to rim. Measured old pessary which is 2.25 inches from rim to rim. "It appears it is about a half an inch different." Advised will let Gloria Cage, FNP know and return call with further recommendations. Patient is agreeable.

## 2015-07-21 NOTE — Telephone Encounter (Signed)
Call to patient. Advised to return to office with both pessary she has had and the new one we just ordered. I will check for correct size and re-order. Patient will come by tomorrow or Friday. Advised it is fine to come at her convenience.   Routing to provider for final review. Patient agreeable to disposition. Will close encounter.

## 2015-07-21 NOTE — Telephone Encounter (Signed)
Spoke with patient. Advised I have spoken with Milford Cage, FNP regarding pessary. Milford Cage, FNP is going to speak with Lamont Snowball nurse supervisor regarding pessary order to see if we have the correct size in stock in the office or if we need to order a new pessary. Advised we will be in contact with her as soon as possible to let her know. Patient is agreeable.

## 2015-07-23 ENCOUNTER — Ambulatory Visit (INDEPENDENT_AMBULATORY_CARE_PROVIDER_SITE_OTHER): Payer: Medicare Other | Admitting: Nurse Practitioner

## 2015-07-23 DIAGNOSIS — N993 Prolapse of vaginal vault after hysterectomy: Secondary | ICD-10-CM

## 2015-07-23 NOTE — Progress Notes (Signed)
74 y.o. MW female [id number] here for follow up of pessary.  She has chronic problems with granulation tissue from the use of the pessary and or Estring.   For a while we having been doing sliver Nitrate treatment to these ares.  On the last visit 07/12/15 the fingerlike projections seemed to be a little larger and very friable.  Other tissue areas were also red from the pessary use.  I then consulted Dr. Sabra Heck who decided we should try a softer pessary style to see if this would help without treating this time for granulations tissue.   She did not have her pessary here as she is able to place pessary in/ out on her own.   Per the paper chart I thought the size she needed was a size # 6 ring - and this was ordered.  Per pt. The size difference is a # 5 ring.  She comes in today to return the previous one and  Change.  While she is here would like to make sure this is correct size and fit.     O: Healthy WD,WN female Affect: alert no distress Pelvic exam: with the pessary out this past week the generalized redness is much improved.  The size # 5 pessary is fitting without difficulty.  A: granulation tissue -vaginal  Post hysterectomy secondary to prolapse 1988  Use of a size # 5 pessary ring with support and Estring    P:  Discussed findings of improved vaginal tissues  Instructions given regarding:  While this pessary is in place she will try to be active to se if the pessary stays in place.  She will remove when she gets home and insert the Estring.  Again checking to make surer everything stays in correct position.  RV prn

## 2015-07-24 NOTE — Progress Notes (Signed)
Encounter reviewed by Dr. Brook Amundson C. Silva.  

## 2015-07-27 ENCOUNTER — Telehealth: Payer: Self-pay | Admitting: Nurse Practitioner

## 2015-07-27 MED ORDER — ESTROGENS, CONJUGATED 0.625 MG/GM VA CREA
TOPICAL_CREAM | VAGINAL | Status: DC
Start: 2015-07-27 — End: 2015-10-21

## 2015-07-27 NOTE — Telephone Encounter (Signed)
She may change to Premarin vaginal cream 1 gm three weekly at bedtime.  Later can reduce to twice a week.  This cream will help in healing the vaginal tissue better than the Estring.  She then can use the new pessary after 7-10 days and see how she does.

## 2015-07-27 NOTE — Telephone Encounter (Signed)
Spoke with patient. Advised of message as seen below from Gloria Werner, [place]. Patient is agreeable and verbalizes understanding. Rx for Premarin vaginal cream 1 gm three times weekly at bedtime then reduce to two times weekly at bedtime sent to patient's pharmacy on file. Patient is agreeable. Will call to provide Gloria Cage, FNP with an update on how she is doing when she reinserts her pessary.  Routing to provider for final review. Patient agreeable to disposition. Will close encounter.   Patient aware provider will review message and nurse will return call if any additional advice or change of disposition.

## 2015-07-27 NOTE — Telephone Encounter (Signed)
Spoke with patient. Patient states that she was seen on 7/29 for new pessary fitting. States new pessary is softer than her last. Is still experiencing bleeding with new pessary. Denies any pain/discomfort. Tried to remove pessary and place estring and estring will not stay in place without her pessary. Patient is unsure what to do at this time. Asking about a cream to use instead of her estring so she can leave her pessary out for a little while. Advised will speak with Milford Cage, FNP and return call with further recommendations. Patient is agreeable.

## 2015-07-27 NOTE — Telephone Encounter (Signed)
Patient is still bleeding with the new pessary.

## 2015-08-11 ENCOUNTER — Telehealth: Payer: Self-pay | Admitting: Internal Medicine

## 2015-08-11 NOTE — Telephone Encounter (Signed)
Pt states she leaned over and felt a pull in her abdomen and there was pain. States there was a hard place that she pushed on and it seemed to go aware. Pt states she went to eat lunch and then her stomach started hurting in the LLQ. Pt has had diverticulitis in the past. Pt scheduled to see Tye Savoy NP tomorrow at 1:30pm. Pt aware of appt.

## 2015-08-12 ENCOUNTER — Ambulatory Visit (INDEPENDENT_AMBULATORY_CARE_PROVIDER_SITE_OTHER): Payer: Medicare Other | Admitting: Nurse Practitioner

## 2015-08-12 ENCOUNTER — Encounter: Payer: Self-pay | Admitting: Nurse Practitioner

## 2015-08-12 VITALS — BP 140/82 | HR 72 | Ht 60.0 in | Wt 147.4 lb

## 2015-08-12 DIAGNOSIS — R1030 Lower abdominal pain, unspecified: Secondary | ICD-10-CM

## 2015-08-12 MED ORDER — AMOXICILLIN-POT CLAVULANATE 875-125 MG PO TABS: 1.0000 | ORAL_TABLET | Freq: Two times a day (BID) | ORAL | Status: AC

## 2015-08-12 NOTE — Progress Notes (Signed)
     History of Present Illness:   Patient is a 74 year old female known to Dr. Henrene Pastor. She had a screening colonoscopy with polypectomy September 2014. In addition to the small ascending colon polyp (sessile serrated ) severe diverticulosis was noted throughout the entire colon. She has a remote history of sigmoid diverticulitis documented by CT scan.  Patient comes in today for evaluation of lower abdominal pain. She was out mowing the grass a couple days ago.  While bending over patient had pain in left mid abdomen. She felt a knot in the area. The knot and pain subsided after rubbing the area for a few minutes.  Later that day, while eating patient developed diffuse lower abdominal pain. Patient though she needed to have a BM but actually didn't.  She went home, fell asleep and no pain since. Later that evening patient did have a couple of small BMs. She normally takes MiraLAX which keeps bowels regular. Patient has no urinary symptoms. No vaginal symptoms. She has a pessary but hasn't used it lately.  No fevers.  Current Medications, Allergies, Past Medical History, Past Surgical History, Family History and Social History were reviewed in [employer] record.  Physical Exam: General: Pleasant, well developed , white female in no acute distress Head: Normocephalic and atraumatic Eyes:  sclerae anicteric, conjunctiva pink  Ears: Normal auditory acuity Lungs: Clear throughout to auscultation Heart: Regular rate and rhythm Abdomen: Soft, non distended, mild mid lower abdominal tenderness.  No masses, no hepatomegaly. Normal bowel sounds Musculoskeletal: Symmetrical with no gross deformities  Extremities: No edema  Neurological: Alert oriented x 4, grossly nonfocal Psychological:  Alert and cooperative. Normal mood and affect  Assessment and Recommendations:  58. 74 year old female with transient acute, diffuse lower abdominal pain yesterday. She does have known severe  diverticular disease. No abdominal pain today. Etiology of transient pain unclear. Her abdominal exam is not overly concerning but she does have mild mid lower abdominal tenderness on exam. Patient was constipated yesterday, still doesn't full evacuated. Pain could be secondary to constipation. Early diverticulitis possible as patient does have a documented history of such.   Need to eliminate constipation as potential cause of pain. Advised patient to increase MiraLAX to twice daily until bowels moving  then she can resume daily dosing .   Since the weekend is near I gave her a prescription for Augmentin. Patient has a history of C. Difficile so she will start Augmentin only for recurrent lower abdominal pain. If antibiotic is needed patient will start clear liquids until feeling better  Patient will call us in a few days with a condition update.   Continue probiotic, especially if starting anti-biotics  2. Bloating, worse as the day progresses. She will try discontinuation of artificial sweeteners. We talked about other etiologies of bloating/gas. Patient is up-to-date with her GYN evaluations.  3. Left mid abdominal bulge noticed by patient a couple of days ago while bending over. The bulge went away with massage. No obvious hernias on exam. If this happens again she will call to be seen / examined.

## 2015-08-12 NOTE — Patient Instructions (Signed)
We have given you a printed signed prescription for Augmentin 875 - 125 mg.  Take Miralax twice daily until bowels moving then back to once daily.

## 2015-08-13 NOTE — Progress Notes (Signed)
Agree with initial assessment and plans as outlined 

## 2015-09-15 ENCOUNTER — Other Ambulatory Visit: Payer: Self-pay | Admitting: Internal Medicine

## 2015-09-28 ENCOUNTER — Encounter: Payer: Self-pay | Admitting: Internal Medicine

## 2015-09-28 ENCOUNTER — Ambulatory Visit (INDEPENDENT_AMBULATORY_CARE_PROVIDER_SITE_OTHER): Payer: Medicare Other | Admitting: Internal Medicine

## 2015-09-28 VITALS — BP 128/86 | HR 79 | Temp 98.9°F | Wt 145.0 lb

## 2015-09-28 DIAGNOSIS — Z23 Encounter for immunization: Secondary | ICD-10-CM

## 2015-09-28 DIAGNOSIS — F329 Major depressive disorder, single episode, unspecified: Secondary | ICD-10-CM

## 2015-09-28 DIAGNOSIS — M25531 Pain in right wrist: Secondary | ICD-10-CM | POA: Diagnosis not present

## 2015-09-28 DIAGNOSIS — R296 Repeated falls: Secondary | ICD-10-CM | POA: Diagnosis not present

## 2015-09-28 DIAGNOSIS — S81832A Puncture wound without foreign body, left lower leg, initial encounter: Secondary | ICD-10-CM | POA: Diagnosis not present

## 2015-09-28 DIAGNOSIS — S0031XA Abrasion of nose, initial encounter: Secondary | ICD-10-CM | POA: Diagnosis not present

## 2015-09-28 DIAGNOSIS — F32A Depression, unspecified: Secondary | ICD-10-CM

## 2015-09-28 MED ORDER — CEPHALEXIN 500 MG PO CAPS: 500.0000 mg | ORAL_CAPSULE | Freq: Three times a day (TID) | ORAL | Status: DC

## 2015-09-28 NOTE — Progress Notes (Signed)
Pre visit review using our clinic review tool, if applicable. No additional management support is needed unless otherwise documented below in the visit note. 

## 2015-09-28 NOTE — Progress Notes (Signed)
Subjective:    Patient ID: Gloria Werner, female    DOB: 03-08-1941, 74 y.o.   MRN: [id number]  HPI  Pt presents to the clinic today s/p fall. She reports she has had 3 falls in the last 3 days. Friday, she was trying to fill up her hot tub. She walked behind the hot tub and tripped over a root. She feel face forward and hit her nose on a trash can. She sustained an abrasion to the bridge of her nose. Saturday, she had just gotten out hot tube and she walked inside her house. She slipped in a puddle of water in her kitchen and fell backwards. She did try to catch herself with her right hand. Her most recent fall occurred yesterday while she was outside doing yardwork. She got tangled in some bushes and landed on a piece of wood. She did sustain a puncture wound to the back of her left knee. She cleaned the wound with soap and warm water. She did take Ibuprofen OTC with good relief. It has been draining some fluid. She denies fever chills or body aches. She had her Tetanus in 2013.  She would also like to get her flu shot while she is here today.  She also wants to followup on depression. She was started on Sertraline 03/2015. She reports it has been working well for her. She is concerned because she has noticed a little bit of weight gain and thinks the Sertraline is causing it. She has failed Lexapro in the past. She denies SI/HI.  Review of Systems      Past Medical History  Diagnosis Date  . Allergic rhinitis due to pollen   . Trigger finger (acquired)   . Diverticulitis of colon (without mention of hemorrhage)   . Abdominal pain, left lower quadrant   . Irritable bowel syndrome   . Diverticulosis of colon (without mention of hemorrhage)   . Cystocele     uses pessary  . 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 calcium carbonate (OS-CAL) 600 MG TABS Take 600 mg by mouth 2 (two) times daily with a meal.      .  clobetasol ointment (TEMOVATE) 6.29 % Apply 1 application topically 2 (two) times daily. 30 g 12  . conjugated estrogens (PREMARIN) vaginal cream Apply 1 gram three times weekly at bedtime for 2 weeks. Then reduce to 1 gram twice a week at bedtime. 30 g 3  . estradiol (ESTRING) 2 MG vaginal ring Place 2 mg vaginally every 3 (three) months. follow package directions 3 each 3  . Multiple Vitamin (MULTIVITAMIN) tablet Take 1 tablet by mouth daily.      . Probiotic Product (PROBIOTIC COLON SUPPORT PO) Take 1 capsule by mouth. Take 1 tab daily.    . Pyridoxine HCl (B-6 PO) Take 1 tablet by mouth daily.    . sertraline (ZOLOFT) 25 MG tablet TAKE ONE TABLET BY MOUTH ONCE DAILY 30 tablet 11  . triamcinolone (NASACORT) 55 MCG/ACT AERO nasal inhaler Place 2 sprays into the nose daily.    Marland Kitchen triamcinolone ointment (KENALOG) 0.5 % Apply BID for up to 7 days with flare. 30 g 12  . vitamin C (ASCORBIC ACID) 500 MG tablet Take 500 mg by mouth daily.    . Vitamin D, Ergocalciferol, (DRISDOL) 50000 UNITS CAPS capsule Take 1 capsule (50,000 Units total) by mouth every 30 (thirty) days. 30 capsule 3   No  current facility-administered medications for this visit.    Allergies  Allergen Reactions  . Codeine Nausea And Vomiting  . Lexapro [Escitalopram] Diarrhea    headache    Family History  Problem Relation Age of Onset  . Emphysema Mother   . Lymphoma Mother   . Asthma Mother   . Cancer Father 28    lung cancer  . Coronary artery disease Brother   . Cancer Brother     breast cancer  . Diabetes Neg Hx   . Hypertension Neg Hx   . Colon cancer Neg Hx   . Cancer Brother     lung cancer    Social History   Social History  . Marital Status: Married    Spouse Name: N/A  . Number of Children: 2  . Years of Education: 12   Occupational History  . business Freight forwarder    Social History Main Topics  . Smoking status: Never Smoker   . Smokeless tobacco: Never Used  . Alcohol Use: No  . Drug Use: No    . Sexual Activity:    Partners: Male    Birth Control/ Protection: Surgical   Other Topics Concern  . Not on file   Social History Narrative   HSG. Married '63. 1 son- 38'; 1-daughter-'68; grandchildren 3. 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 Marriage in good health. 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 Provided lead to [email or web address].     Constitutional: Denies fever, malaise, fatigue, headache or abrupt weight changes.  Musculoskeletal: Pt reports right wrist pain. Denies decrease in range of motion, difficulty with gait, muscle pain or joint swelling.  Skin: Pt reports puncture wound to posterior left knee and abrasion to bridge of nose.Marland Kitchen  Neurological: Denies dizziness, difficulty with memory, difficulty with speech or problems with balance and coordination.  Psych: Pt has history of depression. Denies anxiety,SI/HI.  No other specific complaints in a complete review of systems (except as listed in HPI above).  Objective:   Physical Exam   BP 128/86 mmHg  Pulse 79  Temp(Src) 98.9 F (37.2 C) (Oral)  Wt 145 lb (65.772 kg)  SpO2 98%  LMP 12/25/1986 Wt Readings from Last 3 Encounters:  09/28/15 145 lb (65.772 kg)  08/12/15 147 lb 6 oz (66.849 kg)  07/12/15 148 lb (67.132 kg)    General: Appears her stated age, in NAD. Skin: Warm, dry and intact. Scabbed abrasion noted to bridge of nose. 2 small puncture wounds noted to posterior left medial leg. Draining purulent fluid. No surrounding cellulitis. No streaking. Musculoskeletal: Normal flexion, extension and rotation of the right wrist. No signs of joint swelling. Hand grips equal. No difficulty with gait.  Neurological: Alert and oriented.  Psychiatric: Her mood is normal. She does engage fully. She is pleasant.  BMET    Component Value Date/Time   NA 140 04/08/2015 0904   K 4.4 04/08/2015 0904   CL 105 04/08/2015 0904   CO2 30  04/08/2015 0904   GLUCOSE 87 04/08/2015 0904   BUN 14 04/08/2015 0904   CREATININE 0.70 04/08/2015 0904   CALCIUM 9.6 04/08/2015 0904   GFRNONAA 90 02/26/2008 1019   GFRAA 108 02/26/2008 1019    Lipid Panel     Component Value Date/Time   CHOL 191 04/08/2015 0904   TRIG 55.0 04/08/2015 0904   HDL 56.20 04/08/2015 0904   CHOLHDL 3 04/08/2015 0904   VLDL 11.0 04/08/2015 0904  LDLCALC 124* 04/08/2015 0904    CBC    Component Value Date/Time   WBC 4.7 04/08/2015 0904   RBC 4.70 04/08/2015 0904   HGB 14.0 04/08/2015 0904   HCT 41.2 04/08/2015 0904   PLT 180.0 04/08/2015 0904   MCV 87.7 04/08/2015 0904   MCHC 34.1 04/08/2015 0904   RDW 14.2 04/08/2015 0904   LYMPHSABS 1.3 02/18/2014 1110   MONOABS 0.4 02/18/2014 1110   EOSABS 0.2 02/18/2014 1110   BASOSABS 0.0 02/18/2014 1110    Hgb A1C No results found for: HGBA1C      Assessment & Plan:   Multiples falls due to tripping/slipping:  Discussed fall precautions and home safety  Right wrist pain:  No apparent fracture Information given for RICE  Abrasion to nasal bridge:  Uncomplicated Appears to be healing Ok to use Neosporin as needed  Puncture wound, left lower leg, infected:  Cleansed with Hydrogen Peroxide Covered with TAB and dressing eRx for Keflex TID x 10 days Continue Ibuprofen as needed for pain Return precautions given  Need for influenza vaccine:  Flu shot today  Depression:  Advised her that SSRI's can cause up to 5 lb weight gain She feels like she is well controlled and is not interested in changing therapy at this time  RTC in 03/2016 for your Medicare Wellness, sooner if needed

## 2015-09-28 NOTE — Addendum Note (Signed)
Addended by: Lurlean Nanny on: 09/28/2015 10:18 AM   Modules accepted: Orders

## 2015-09-28 NOTE — Patient Instructions (Signed)
RICE: Routine Care for Injuries The routine care of many injuries includes Rest, Ice, Compression, and Elevation (RICE). HOME CARE INSTRUCTIONS  Rest is needed to allow your body to heal. Routine activities can usually be resumed when comfortable. Injured tendons and bones can take up to 6 weeks to heal. Tendons are the cord-like structures that attach muscle to bone.  Ice following an injury helps keep the swelling down and reduces pain.  Put ice in a plastic bag.  Place a towel between your skin and the bag.  Leave the ice on for 15-20 minutes, 3-4 times a day, or as directed by your health care provider. Do this while awake, for the first 24 to 48 hours. After that, continue as directed by your caregiver.  Compression helps keep swelling down. It also gives support and helps with discomfort. If an elastic bandage has been applied, it should be removed and reapplied every 3 to 4 hours. It should not be applied tightly, but firmly enough to keep swelling down. Watch fingers or toes for swelling, bluish discoloration, coldness, numbness, or excessive pain. If any of these problems occur, remove the bandage and reapply loosely. Contact your caregiver if these problems continue.  Elevation helps reduce swelling and decreases pain. With extremities, such as the arms, hands, legs, and feet, the injured area should be placed near or above the level of the heart, if possible. SEEK IMMEDIATE MEDICAL CARE IF:  You have persistent pain and swelling.  You develop redness, numbness, or unexpected weakness.  Your symptoms are getting worse rather than improving after several days. These symptoms may indicate that further evaluation or further X-rays are needed. Sometimes, X-rays may not show a small broken bone (fracture) until 1 week or 10 days later. Make a follow-up appointment with your caregiver. Ask when your X-ray results will be ready. Make sure you get your X-ray results. Document Released:  03/25/2001 Document Revised: 12/16/2013 Document Reviewed: 05/12/2011 ExitCare Patient Information 2015 [employer]. This information is not intended to replace advice given to you by your health care provider. Make sure you discuss any questions you have with your health care provider.  

## 2015-10-11 ENCOUNTER — Encounter: Payer: Self-pay | Admitting: Internal Medicine

## 2015-10-11 ENCOUNTER — Ambulatory Visit (INDEPENDENT_AMBULATORY_CARE_PROVIDER_SITE_OTHER): Payer: Medicare Other | Admitting: Internal Medicine

## 2015-10-11 VITALS — BP 128/84 | HR 80 | Temp 98.2°F | Wt 143.0 lb

## 2015-10-11 DIAGNOSIS — S81831D Puncture wound without foreign body, right lower leg, subsequent encounter: Secondary | ICD-10-CM | POA: Diagnosis not present

## 2015-10-11 DIAGNOSIS — M795 Residual foreign body in soft tissue: Secondary | ICD-10-CM | POA: Diagnosis not present

## 2015-10-11 NOTE — Progress Notes (Signed)
Pre visit review using our clinic review tool, if applicable. No additional management support is needed unless otherwise documented below in the visit note. 

## 2015-10-11 NOTE — Patient Instructions (Signed)
Puncture Wound A puncture wound is an injury that is caused by a sharp, thin object that goes through (penetrates) your skin. Usually, a puncture wound does not leave a large opening in your skin, so it may not bleed a lot. However, when you get a puncture wound, dirt or other materials (foreign bodies) can be forced into your wound and break off inside. This increases the chance of infection, such as tetanus. CAUSES Puncture wounds are caused by any sharp, thin object that goes through your skin, such as:  Animal teeth, as with an animal bite.  Sharp, pointed objects, such as nails, splinters of glass, fishhooks, and needles. SYMPTOMS Symptoms of a puncture wound include:  Pain.  Bleeding.  Swelling.  Bruising.  Fluid leaking from the wound.  Numbness, tingling, or loss of function. DIAGNOSIS This condition is diagnosed with a medical history and physical exam. Your wound will be checked to see if it contains any foreign bodies. You may also have X-rays or other imaging tests. TREATMENT Treatment for a puncture wound depends on how serious the wound is. It also depends on whether the wound contains any foreign bodies. Treatment for all types of puncture wounds usually starts with:  Controlling the bleeding.  Washing out the wound with a germ-free (sterile) salt-water solution.  Checking the wound for foreign bodies. Treatment may also include:  Having the wound opened surgically to remove a foreign object.  Closing the wound with stitches (sutures) if it continues to bleed.  Covering the wound with antibiotic ointments and a bandage (dressing).  Receiving a tetanus shot.  Receiving a rabies vaccine. HOME CARE INSTRUCTIONS Medicines  Take or apply over-the-counter and prescription medicines only as told by your health care provider.  If you were prescribed an antibiotic, take or apply it as told by your health care provider. Do not stop using the antibiotic even if  your condition improves. Wound Care  There are many ways to close and cover a wound. For example, a wound can be covered with sutures, skin glue, or adhesive strips. Follow instructions from your health care provider about:  How to take care of your wound.  When and how you should change your dressing.  When you should remove your dressing.  Removing whatever was used to close your wound.  Keep the dressing dry as told by your health care provider. Do not take baths, swim, use a hot tub, or do anything that would put your wound underwater until your health care provider approves.  Clean the wound as told by your health care provider.  Do not scratch or pick at the wound.  Check your wound every day for signs of infection. Watch for:  Redness, swelling, or pain.  Fluid, blood, or pus. General Instructions  Raise (elevate) the injured area above the level of your heart while you are sitting or lying down.  If your puncture wound is in your foot, ask your health care provider if you need to avoid putting weight on your foot and for how long.  Keep all follow-up visits as told by your health care provider. This is important. SEEK MEDICAL CARE IF:  You received a tetanus shot and you have swelling, severe pain, redness, or bleeding at the injection site.  You have a fever.  Your sutures come out.  You notice a bad smell coming from your wound or your dressing.  You notice something coming out of your wound, such as wood or glass.  Your   pain is not controlled with medicine.  You have increased redness, swelling, or pain at the site of your wound.  You have fluid, blood, or pus coming from your wound.  You notice a change in the color of your skin near your wound.  You need to change the dressing frequently due to fluid, blood, or pus draining from your wound.  You develop a new rash.  You develop numbness around your wound. SEEK IMMEDIATE MEDICAL CARE IF:  You  develop severe swelling around your wound.  Your pain suddenly increases and is severe.  You develop painful skin lumps.  You have a red streak going away from your wound.  The wound is on your hand or foot and you cannot properly move a finger or toe.  The wound is on your hand or foot and you notice that your fingers or toes look pale or bluish.   This information is not intended to replace advice given to you by your health care provider. Make sure you discuss any questions you have with your health care provider.   Document Released: 09/20/2005 Document Revised: 09/01/2015 Document Reviewed: 02/03/2015 Elsevier Interactive Patient Education 2016 [employer]  

## 2015-10-11 NOTE — Progress Notes (Signed)
Subjective:    Patient ID: Gloria Werner, female    DOB: [birth year], 74 y.o.   MRN: [id number]  HPI  Pt presents to the clinic today to follow up cellulitis of her left lower extremity. On 9/30, she fell in her yard, landing on a piece of wood. She sustained a puncture wound to her left lower leg. She was treated with a 10 day course of Keflex. She finished her antibiotics on Friday. She noticed a small brown piece sticking out of the wound which she tried to pull out last night. She reports it looked like a stick. She is concerned that there is still some wood particle remnants left in her leg. She has noticed some bloody drainage from the area. The redness and warmth have improved since she finished the antibiotics. She denies fever or chills.   Review of Systems      Past Medical History  Diagnosis Date  . Allergic rhinitis due to pollen   . Trigger finger (acquired)   . Diverticulitis of colon (without mention of hemorrhage)   . Abdominal pain, left lower quadrant   . Irritable bowel syndrome   . Diverticulosis of colon (without mention of hemorrhage)   . Cystocele     uses pessary  . 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 calcium carbonate (OS-CAL) 600 MG TABS Take 600 mg by mouth 2 (two) times daily with a meal.      . clobetasol ointment (TEMOVATE) 4.09 % Apply 1 application topically 2 (two) times daily. 30 g 12  . conjugated estrogens (PREMARIN) vaginal cream Apply 1 gram three times weekly at bedtime for 2 weeks. Then reduce to 1 gram twice a week at bedtime. 30 g 3  . estradiol (ESTRING) 2 MG vaginal ring Place 2 mg vaginally every 3 (three) months. follow package directions 3 each 3  . Multiple Vitamin (MULTIVITAMIN) tablet Take 1 tablet by mouth daily.      . Probiotic Product (PROBIOTIC COLON SUPPORT PO) Take 1 capsule by mouth. Take 1 tab daily.    . Pyridoxine HCl (B-6 PO) Take 1 tablet by mouth  daily.    . sertraline (ZOLOFT) 25 MG tablet TAKE ONE TABLET BY MOUTH ONCE DAILY 30 tablet 11  . triamcinolone (NASACORT) 55 MCG/ACT AERO nasal inhaler Place 2 sprays into the nose daily.    Marland Kitchen triamcinolone ointment (KENALOG) 0.5 % Apply BID for up to 7 days with flare. 30 g 12  . vitamin C (ASCORBIC ACID) 500 MG tablet Take 500 mg by mouth daily.    . Vitamin D, Ergocalciferol, (DRISDOL) 50000 UNITS CAPS capsule Take 1 capsule (50,000 Units total) by mouth every 30 (thirty) days. 30 capsule 3   No current facility-administered medications for this visit.    Allergies  Allergen Reactions  . Codeine Nausea And Vomiting  . Lexapro [Escitalopram] Diarrhea    headache    Family History  Problem Relation Age of Onset  . Emphysema Mother   . Lymphoma Mother   . Asthma Mother   . Cancer Father 40    lung cancer  . Coronary artery disease Brother   . Cancer Brother     breast cancer  . Diabetes Neg Hx   . Hypertension Neg Hx   . Colon cancer Neg Hx   . Cancer Brother     lung cancer    Social History   Social History  .  Marital Status: Married    Spouse Name: N/A  . Number of Children: 2  . Years of Education: 12   Occupational History  . business Freight forwarder    Social History Main Topics  . Smoking status: Never Smoker   . Smokeless tobacco: Never Used  . Alcohol Use: No  . Drug Use: No  . Sexual Activity:    Partners: Male    Birth Control/ Protection: Surgical   Other Topics Concern  . Not on file   Social History Narrative   HSG. Married '63. 1 son- 24'; 1-daughter-'68; grandchildren 3. 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 Marriage in good health. 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 Provided lead to [email or web address].     Constitutional: Denies fever, malaise, fatigue, headache or abrupt weight changes.  Respiratory: Denies difficulty breathing, shortness of breath, cough or  sputum production.   Cardiovascular: Denies chest pain, chest tightness, palpitations or swelling in the hands or feet.  Skin: Pt reports puncture wound to left leg. Denies rashes, lesions or ulcercations.   No other specific complaints in a complete review of systems (except as listed in HPI above).  Objective:   Physical Exam  BP 128/84 mmHg  Pulse 80  Temp(Src) 98.2 F (36.8 C) (Oral)  Wt 143 lb (64.864 kg)  SpO2 98%  LMP 12/25/1986 Wt Readings from Last 3 Encounters:  10/11/15 143 lb (64.864 kg)  09/28/15 145 lb (65.772 kg)  08/12/15 147 lb 6 oz (66.849 kg)    General: Appears her stated age, well developed, well nourished in NAD. Skin: Warm, dry and intact. < 1 cm round open puncture wound. Foreign body noted. Pus expressed from wound. No surrounding redness or warmth noted. Cardiovascular: Normal rate and rhythm. S1,S2 noted.   Pulmonary/Chest: Normal effort and positive vesicular breath sounds. No respiratory distress. No wheezes, rales or ronchi noted.  Neurological: Alert and oriented.    BMET    Component Value Date/Time   NA 140 04/08/2015 0904   K 4.4 04/08/2015 0904   CL 105 04/08/2015 0904   CO2 30 04/08/2015 0904   GLUCOSE 87 04/08/2015 0904   BUN 14 04/08/2015 0904   CREATININE 0.70 04/08/2015 0904   CALCIUM 9.6 04/08/2015 0904   GFRNONAA 90 02/26/2008 1019   GFRAA 108 02/26/2008 1019    Lipid Panel     Component Value Date/Time   CHOL 191 04/08/2015 0904   TRIG 55.0 04/08/2015 0904   HDL 56.20 04/08/2015 0904   CHOLHDL 3 04/08/2015 0904   VLDL 11.0 04/08/2015 0904   LDLCALC 124* 04/08/2015 0904    CBC    Component Value Date/Time   WBC 4.7 04/08/2015 0904   RBC 4.70 04/08/2015 0904   HGB 14.0 04/08/2015 0904   HCT 41.2 04/08/2015 0904   PLT 180.0 04/08/2015 0904   MCV 87.7 04/08/2015 0904   MCHC 34.1 04/08/2015 0904   RDW 14.2 04/08/2015 0904   LYMPHSABS 1.3 02/18/2014 1110   MONOABS 0.4 02/18/2014 1110   EOSABS 0.2 02/18/2014 1110     BASOSABS 0.0 02/18/2014 1110    Hgb A1C No results found for: HGBA1C       Assessment & Plan:   Puncture wound with foreign body:  I was able to pull out 1 cm long piece of wood using forceps from the pts open wound Area cleansed with sterile saline Covered with TAB and applied dressing I do not think further  antibiotics are needed at this point (especially given pt history of Cdiff) Aftercare instructions given Return precautions given  RTC as needed or if symptoms persist or worsen

## 2015-10-17 ENCOUNTER — Telehealth: Payer: Self-pay | Admitting: Internal Medicine

## 2015-10-17 NOTE — Telephone Encounter (Signed)
The patient called complaining of 2 days of lower abdominal pain and one day of diarrhea. Has a history of diverticulitis and C. Diff. She was concerned. Also, 10 days ago finished 10 day course of Cipro for skin wound. No N,V,Fvr, or blood. Proceeding with her activities and diet. I prescribed empiric course of Flagyl (she's had in past w/o problems) 250 mg tid x 7 days and asked her to call the office in a few days with an update (but sooner if needed).

## 2015-10-19 ENCOUNTER — Other Ambulatory Visit: Payer: Self-pay

## 2015-10-19 ENCOUNTER — Other Ambulatory Visit: Payer: Medicare Other

## 2015-10-19 ENCOUNTER — Telehealth: Payer: Self-pay | Admitting: Internal Medicine

## 2015-10-19 DIAGNOSIS — R1032 Left lower quadrant pain: Secondary | ICD-10-CM | POA: Diagnosis not present

## 2015-10-19 DIAGNOSIS — R197 Diarrhea, unspecified: Secondary | ICD-10-CM

## 2015-10-19 DIAGNOSIS — R195 Other fecal abnormalities: Secondary | ICD-10-CM | POA: Diagnosis not present

## 2015-10-19 MED ORDER — DICYCLOMINE HCL 20 MG PO TABS: 20.0000 mg | ORAL_TABLET | Freq: Four times a day (QID) | ORAL | Status: DC

## 2015-10-19 NOTE — Telephone Encounter (Signed)
Pt aware, labs ordered, script sent to pharmacy and pt scheduled to see Lori Hvozdovic, PA-C 10/21/15@1 :30pm. Pt aware of appt.

## 2015-10-19 NOTE — Telephone Encounter (Signed)
Have her come in for Clostridium difficile by PCR and stool pathogen panel. Continue Flagyl. Prescribed Bentyl 20 mg every 6 hours as needed for cramping. Low-dose Imodium okay. Drink plenty of fluids. Have her see APP this week as soon as possible. I believe she is traveling beginning Saturday. Thanks

## 2015-10-19 NOTE — Telephone Encounter (Signed)
Pt states that Dr. Henrene Pastor started her on Flagyl Sunday for abdominal pain. Pt states she is still having watery stools and terrible cramping. States she is running a temp around 100. Please advise.

## 2015-10-20 LAB — CLOSTRIDIUM DIFFICILE BY PCR: CDIFFPCR: DETECTED — AB

## 2015-10-21 ENCOUNTER — Other Ambulatory Visit (INDEPENDENT_AMBULATORY_CARE_PROVIDER_SITE_OTHER): Payer: Medicare Other

## 2015-10-21 ENCOUNTER — Ambulatory Visit (INDEPENDENT_AMBULATORY_CARE_PROVIDER_SITE_OTHER): Payer: Medicare Other | Admitting: Physician Assistant

## 2015-10-21 ENCOUNTER — Other Ambulatory Visit: Payer: Self-pay

## 2015-10-21 ENCOUNTER — Encounter: Payer: Self-pay | Admitting: Physician Assistant

## 2015-10-21 ENCOUNTER — Ambulatory Visit (INDEPENDENT_AMBULATORY_CARE_PROVIDER_SITE_OTHER)
Admission: RE | Admit: 2015-10-21 | Discharge: 2015-10-21 | Disposition: A | Discharge status: Home / Self Care | Payer: Medicare Other | Source: Ambulatory Visit | Attending: Physician Assistant | Admitting: Physician Assistant

## 2015-10-21 VITALS — BP 124/64 | HR 70 | Temp 98.8°F | Ht 60.0 in | Wt 144.0 lb

## 2015-10-21 DIAGNOSIS — R195 Other fecal abnormalities: Secondary | ICD-10-CM

## 2015-10-21 DIAGNOSIS — K5730 Diverticulosis of large intestine without perforation or abscess without bleeding: Secondary | ICD-10-CM

## 2015-10-21 DIAGNOSIS — R1032 Left lower quadrant pain: Secondary | ICD-10-CM

## 2015-10-21 DIAGNOSIS — B9689 Other specified bacterial agents as the cause of diseases classified elsewhere: Secondary | ICD-10-CM

## 2015-10-21 DIAGNOSIS — K5732 Diverticulitis of large intestine without perforation or abscess without bleeding: Secondary | ICD-10-CM

## 2015-10-21 DIAGNOSIS — R110 Nausea: Secondary | ICD-10-CM

## 2015-10-21 LAB — BASIC METABOLIC PANEL
BUN: 10 mg/dL (ref 6–23)
CHLORIDE: 101 meq/L (ref 96–112)
CO2: 30 meq/L (ref 19–32)
Calcium: 9.1 mg/dL (ref 8.4–10.5)
Creatinine, Ser: 0.73 mg/dL (ref 0.40–1.20)
GFR: 83.32 mL/min (ref >60.00)
GLUCOSE: 137 mg/dL — AB (ref 70–99)
POTASSIUM: 3.6 meq/L (ref 3.5–5.1)
SODIUM: 138 meq/L (ref 135–145)

## 2015-10-21 LAB — CBC WITH DIFFERENTIAL/PLATELET
Basophils Absolute: 0.1 10*3/uL (ref 0.0–0.1)
Basophils Relative: 0.4 % (ref 0.0–3.0)
EOS ABS: 0.2 10*3/uL (ref 0.0–0.7)
EOS PCT: 1.1 % (ref 0.0–5.0)
HCT: 43.2 % (ref 36.0–46.0)
HEMOGLOBIN: 14.2 g/dL (ref 12.0–15.0)
Lymphocytes Relative: 7.5 % — ABNORMAL LOW (ref 12.0–46.0)
Lymphs Abs: 1 10*3/uL (ref 0.7–4.0)
MCHC: 32.9 g/dL (ref 30.0–36.0)
MCV: 89 fl (ref 78.0–100.0)
MONO ABS: 1 10*3/uL (ref 0.1–1.0)
Monocytes Relative: 7 % (ref 3.0–12.0)
Neutro Abs: 11.5 10*3/uL — ABNORMAL HIGH (ref 1.4–7.7)
Neutrophils Relative %: 84 % — ABNORMAL HIGH (ref 43.0–77.0)
Platelets: 229 10*3/uL (ref 150.0–400.0)
RBC: 4.85 Mil/uL (ref 3.87–5.11)
RDW: 13.1 % (ref 11.5–15.5)
WBC: 13.6 10*3/uL — AB (ref 4.0–10.5)

## 2015-10-21 LAB — GASTROINTESTINAL PATHOGEN PANEL PCR
C. DIFFICILE TOX A/B, PCR: POSITIVE — AB
CRYPTOSPORIDIUM, PCR: NEGATIVE
Campylobacter, PCR: NEGATIVE
E COLI (STEC) STX1/STX2, PCR: NEGATIVE
E coli (ETEC) LT/ST PCR: NEGATIVE
E coli 0157, PCR: NEGATIVE
Giardia lamblia, PCR: NEGATIVE
NOROVIRUS, PCR: NEGATIVE
Rotavirus A, PCR: NEGATIVE
Salmonella, PCR: NEGATIVE
Shigella, PCR: NEGATIVE

## 2015-10-21 MED ORDER — IOHEXOL 300 MG/ML  SOLN
100.0000 mL | Freq: Once | INTRAMUSCULAR | Status: AC | PRN
  Administered 2015-10-21: 100 mL via INTRAVENOUS

## 2015-10-21 MED ORDER — VANCOMYCIN 50 MG/ML ORAL SOLUTION: ORAL | Status: DC

## 2015-10-21 MED ORDER — CIPROFLOXACIN HCL 500 MG PO TABS: 500.0000 mg | ORAL_TABLET | Freq: Two times a day (BID) | ORAL | Status: DC

## 2015-10-21 MED ORDER — SACCHAROMYCES BOULARDII 250 MG PO CAPS: 500.0000 mg | ORAL_CAPSULE | Freq: Two times a day (BID) | ORAL | Status: DC

## 2015-10-21 MED ORDER — METRONIDAZOLE 500 MG PO TABS: 500.0000 mg | ORAL_TABLET | Freq: Two times a day (BID) | ORAL | Status: DC

## 2015-10-21 MED ORDER — ONDANSETRON HCL 4 MG PO TABS: 4.0000 mg | ORAL_TABLET | Freq: Three times a day (TID) | ORAL | Status: DC | PRN

## 2015-10-21 NOTE — Progress Notes (Signed)
Patient ID: Gloria Werner, female   DOB: 1941/03/04, 74 y.o.   MRN: [id number]     History of Present Illness: Gloria Werner is a delightful 74 year old female who was known to Dr. Henrene Pastor. She has a history of severe diverticular disease. Her last colonoscopy was on 09/01/2013 at which time severe diverticulosis was noted throughout the entire examined colon. A diminutive polyp was found in the ascending colon. She also has a history of C. difficile 4 years ago which was treated with Flagyl 2. She was doing well until late September, when she fell and got a splinter. She was seen by her primary care provider in early October and given a 10 day course of Keflex. Within days of completion of the Keflex she began to have diarrhea and after several days of diarrhea developed lower abdominal pain. She was tested for C. difficile 2 days ago and was found to be positive she has been taking Flagyl her stools are now mushy but she continues to have abdominal pain. She says she had a temperature of 100.52 nights ago at home and she felt warm last night and feels warm today. She is nauseous but has not vomited she has been able to keep liquids down. She has no dysuria and no pneumaturia. She has left lower quadrant pain and suprapubic pain with no radiation. Today her pain is worse when she walks around. She feels most comfortable when she is just sitting. Patient states she is supposed to leave in 2 days for [place] for a week. Patient says her pain is a 6 on a scale of 1-10 at rest but shoots up to an 8 or 9 when she is walking.   Past Medical History  Diagnosis Date  . Allergic rhinitis due to pollen   . Trigger finger (acquired)   . Diverticulitis of colon (without mention of hemorrhage)   . Abdominal pain, left lower quadrant   . Irritable bowel syndrome   . Diverticulosis of colon (without mention of hemorrhage)   . Cystocele     uses pessary  . Lichen simplex chronicus 10/05    Past  Surgical History  Procedure Laterality Date  . Abdominal hysterectomy  1988    secondary to prolapse  . Tonsillectomy    . Nasal sinus surgery  1970  . Bladder suspension      A-P with Hyst   Family History  Problem Relation Age of Onset  . Emphysema Mother   . Lymphoma Mother   . Asthma Mother   . Cancer Father 70    lung cancer  . Coronary artery disease Brother   . Cancer Brother     breast cancer  . Diabetes Neg Hx   . Hypertension Neg Hx   . Colon cancer Neg Hx   . Cancer Brother     lung cancer   Social History  Substance Use Topics  . Smoking status: Never Smoker   . Smokeless tobacco: Never Used  . Alcohol Use: No   Current Outpatient Prescriptions  Medication Sig Dispense Refill  . calcium carbonate (OS-CAL) 600 MG TABS Take 600 mg by mouth 2 (two) times daily with a meal.      . clobetasol ointment (TEMOVATE) 0.53 % Apply 1 application topically 2 (two) times daily. 30 g 12  . dicyclomine (BENTYL) 20 MG tablet Take 1 tablet (20 mg total) by mouth every 6 (six) hours. 60 tablet 0  . estradiol (ESTRING) 2 MG vaginal ring  Place 2 mg vaginally every 3 (three) months. follow package directions 3 each 3  . metroNIDAZOLE (FLAGYL) 250 MG tablet Take 250 mg by mouth 3 (three) times daily.    . Multiple Vitamin (MULTIVITAMIN) tablet Take 1 tablet by mouth daily.      . Probiotic Product (PROBIOTIC COLON SUPPORT PO) Take 1 capsule by mouth. Take 1 tab daily.    . Pyridoxine HCl (B-6 PO) Take 1 tablet by mouth daily.    . sertraline (ZOLOFT) 25 MG tablet TAKE ONE TABLET BY MOUTH ONCE DAILY 30 tablet 11  . triamcinolone (NASACORT) 55 MCG/ACT AERO nasal inhaler Place 2 sprays into the nose daily.    Marland Kitchen triamcinolone ointment (KENALOG) 0.5 % Apply BID for up to 7 days with flare. 30 g 12  . vitamin C (ASCORBIC ACID) 500 MG tablet Take 500 mg by mouth daily.    . Vitamin D, Ergocalciferol, (DRISDOL) 50000 UNITS CAPS capsule Take 1 capsule (50,000 Units total) by mouth every 30  (thirty) days. 30 capsule 3  . ondansetron (ZOFRAN) 4 MG tablet Take 1 tablet (4 mg total) by mouth every 8 (eight) hours as needed for nausea or vomiting. 30 tablet 0   No current facility-administered medications for this visit.   Allergies  Allergen Reactions  . Codeine Nausea And Vomiting  . Lexapro [Escitalopram] Diarrhea    headache     Review of Systems: Gen: Denies any  chills, sweats, anorexia, fatigue, weakness, malaise, weight loss, and sleep disorder. Had temp 100.52 nights ago CV: Denies chest pain, angina, palpitations, syncope, orthopnea, PND, peripheral edema, and claudication. Resp: Denies dyspnea at rest, dyspnea with exercise, cough, sputum, wheezing, coughing up blood, and pleurisy. GI: Denies vomiting blood, jaundice, and fecal incontinence.   Denies dysphagia or odynophagia. GU : Denies urinary burning, blood in urine, urinary frequency, urinary hesitancy, nocturnal urination, and urinary incontinence. MS: Denies joint pain, limitation of movement, and swelling, stiffness, low back pain, extremity pain. Denies muscle weakness, cramps, atrophy.  Derm: Denies rash, itching, dry skin, hives, moles, warts, or unhealing ulcers.  Psych: Denies depression, anxiety, memory loss, suicidal ideation, hallucinations, paranoia, and confusion. Heme: Denies bruising, bleeding, and enlarged lymph nodes. Neuro:  Denies any headaches, dizziness, paresthesia Endo:  Denies any problems with DM, thyroid, adrenal  LAB RESULTS: Stool for C. difficile on 10/19/2015 was positive.  Physical Exam: BP 124/64 mmHg  Pulse 70  Temp(Src) 98.8 F (37.1 C) (Oral)  Ht 5' (1.524 m)  Wt 144 lb (65.318 kg)  BMI 28.12 kg/m2  LMP 12/25/1986 General: Pleasant, well developed , Caucasian female who appears uncomfortable Head: Normocephalic and atraumatic Eyes:  sclerae anicteric, conjunctiva pink  Ears: Normal auditory acuity Lungs: Clear throughout to auscultation Heart: Regular rate and  rhythm Abdomen: Soft, non distended, tender to palpation in his left lower quadrant and suprapubic area with guarding but no rebound, No masses, no hepatomegaly. Normal bowel sounds Musculoskeletal: Symmetrical with no gross deformities  Extremities: No edema  Neurological: Alert oriented x 4, grossly nonfocal Psychological:  Alert and cooperative. Normal mood and affect  Assessment and Recommendations: 74 year old female with a known history of severe diverticulosis, currently with recurrent C. difficile who has had a low-grade temperature and now has abdominal pain and nausea. Symptoms may be due to a C. difficile related colitis, however she may have diverticulitis as well. She will be started on vancomycin suspension 125 mg per 5 mL, 5 mL orally 4 times daily for 14 days. She will be  started on florastoer 250 mg, 2 capsules twice daily for 8 weeks. She will adhere to a low residue, low-fat diet. A CBC and basic metabolic panel will be obtained and she will be scheduled for a stat CT of the abdomen and pelvis to evaluate for colitis, diverticulitis, abscess, etc. Further recommendations will be made pending the findings of the above. She has also been prescribed Zofran 4 mg 1 by mouth every 8 hours prn nausea.     Juliah Scadden, Vita Barley PA-C 10/21/2015,

## 2015-10-21 NOTE — Patient Instructions (Addendum)
Your physician has requested that you go to the basement for the following lab work before leaving today:CBC, BMET.  We have sent the following medications to your pharmacy for you to pick up at your convenience:Zofran, Florastor and vancomycin.  Continue a low fat/residue diet.  You have been scheduled for a CT scan of the abdomen and pelvis at [facility] ([address]---this is in the same building as [facility]).   You are scheduled on 10/21/15 at 4:00pm. You should arrive 15 minutes prior to your appointment time for registration. Please follow the written instructions below on the day of your exam:  WARNING: IF YOU ARE ALLERGIC TO IODINE/X-RAY DYE, PLEASE NOTIFY RADIOLOGY IMMEDIATELY AT [phone]! YOU WILL BE GIVEN A 13 HOUR PREMEDICATION PREP.  1) Do not eat or drink anything after 12:00pm (4 hours prior to your test) 2) You have been given 2 bottles of oral contrast to drink. The solution may taste   better if refrigerated, but do NOT add ice or any other liquid to this solution. Shake well before drinking.    Drink 1 bottle of contrast @ 2:00pm (2 hours prior to your exam)  Drink 1 bottle of contrast @ 3:00pm (1 hour prior to your exam)  You may take any medications as prescribed with a small amount of water except for the following: Metformin, Glucophage, Glucovance, Avandamet, Riomet, Fortamet, Actoplus Met, Janumet, Glumetza or Metaglip. The above medications must be held the day of the exam AND 48 hours after the exam.  The purpose of you drinking the oral contrast is to aid in the visualization of your intestinal tract. The contrast solution may cause some diarrhea. Before your exam is started, you will be given a small amount of fluid to drink. Depending on your individual set of symptoms, you may also receive an intravenous injection of x-ray contrast/dye. Plan on being at [facility] for 30 minutes or long, depending on the type of exam you are  having performed.  This test typically takes 30-45 minutes to complete.  If you have any questions regarding your exam or if you need to reschedule, you may call the CT department at [phone] between the hours of 8:00 am and 5:00 pm, Monday-Friday.  ________________________________________________________________________

## 2015-10-21 NOTE — Progress Notes (Signed)
Agree with initial assessment and plans as outlined 

## 2015-11-05 ENCOUNTER — Encounter: Payer: Self-pay | Admitting: Physician Assistant

## 2015-11-05 ENCOUNTER — Ambulatory Visit (INDEPENDENT_AMBULATORY_CARE_PROVIDER_SITE_OTHER): Payer: Medicare Other | Admitting: Physician Assistant

## 2015-11-05 VITALS — BP 132/78 | HR 72 | Ht 60.0 in | Wt 144.0 lb

## 2015-11-05 DIAGNOSIS — A0472 Enterocolitis due to Clostridium difficile, not specified as recurrent: Secondary | ICD-10-CM

## 2015-11-05 DIAGNOSIS — K5732 Diverticulitis of large intestine without perforation or abscess without bleeding: Secondary | ICD-10-CM

## 2015-11-05 DIAGNOSIS — A047 Enterocolitis due to Clostridium difficile: Secondary | ICD-10-CM

## 2015-11-05 MED ORDER — SACCHAROMYCES BOULARDII 250 MG PO CAPS: 500.0000 mg | ORAL_CAPSULE | Freq: Two times a day (BID) | ORAL | Status: AC

## 2015-11-05 NOTE — Patient Instructions (Signed)
We have sent the following medications to your pharmacy for you to pick up at your convenience: Florastor. Continue taking twice a day for one month then daily.   Please go to the lab and pick up a stool kit and return it the week of 11/20.  Call if you are having any problems.

## 2015-11-05 NOTE — Progress Notes (Signed)
Patient ID: Gloria Werner, female   DOB: 05-Aug-1941, 74 y.o.   MRN: [id number]   Subjective:    Patient ID: Gloria Werner, female    DOB: 1941-01-06, 74 y.o.   MRN: [id number]  HPI  Gloria Werner is a pleasant 74 year old female known to Dr. Henrene Pastor. She had been seen in 2014 when she underwent colonoscopy which showed severe pandiverticulosis. Patient was recently seen in the office in October 2016. She had developed C. difficile after taking a course of Keflex for a wound infection. She was initially treated with Flagyl and then switched to vancomycin 125 mg by mouth 4 times daily. She was seen in the office on October 27 by [facility] , she was also having significant left lower quadrant abdominal pain. CT of the abdomen and pelvis was obtained and she was found to have acute sigmoid diverticulitis. Labs at that time showed an elevated WBC of 13.6. Patient was given a seven-day course of Cipro 500 mg by mouth twice a day and Flagyl 500 mg by mouth twice a day. She comes back in today for follow-up. She says the diarrhea has resolved. She is having one to 2 soft stools per day and says she's not completely back to normal but much better. Has not noted any melena or hematochezia. No fever or chills. She said her energy level is still poor and appetite is not great. LLQ  discomfort is 95% improved. She completed all of her antibiotics about a week ago.  She is quite concerned about C. difficile that she also had an episode a few years ago.  Review of Systems Pertinent positive and negative review of systems were noted in the above HPI section.  All other review of systems was otherwise negative.  Outpatient Encounter Prescriptions as of 11/05/2015  Medication Sig  . calcium carbonate (OS-CAL) 600 MG TABS Take 600 mg by mouth 2 (two) times daily with a meal.    . clobetasol ointment (TEMOVATE) [id number] % Apply 1 application topically 2 (two) times daily.  Marland Kitchen estradiol (ESTRING) 2 MG vaginal ring  Place 2 mg vaginally every 3 (three) months. follow package directions  . Multiple Vitamin (MULTIVITAMIN) tablet Take 1 tablet by mouth daily.    . ondansetron (ZOFRAN) 4 MG tablet Take 1 tablet (4 mg total) by mouth every 8 (eight) hours as needed for nausea or vomiting.  . Pyridoxine HCl (B-6 PO) Take 1 tablet by mouth daily.  Marland Kitchen saccharomyces boulardii (FLORASTOR) 250 MG capsule Take 2 capsules (500 mg total) by mouth 2 (two) times daily.  . sertraline (ZOLOFT) 25 MG tablet TAKE ONE TABLET BY MOUTH ONCE DAILY  . triamcinolone (NASACORT) 55 MCG/ACT AERO nasal inhaler Place 2 sprays into the nose daily.  Marland Kitchen triamcinolone ointment (KENALOG) 0.5 % Apply BID for up to 7 days with flare.  . Vitamin D, Ergocalciferol, (DRISDOL) 50000 UNITS CAPS capsule Take 1 capsule (50,000 Units total) by mouth every 30 (thirty) days.  . [DISCONTINUED] saccharomyces boulardii (FLORASTOR) 250 MG capsule Take 2 capsules (500 mg total) by mouth 2 (two) times daily.  Marland Kitchen dicyclomine (BENTYL) 20 MG tablet Take 1 tablet (20 mg total) by mouth every 6 (six) hours. (Patient not taking: Reported on 11/05/2015)  . vitamin C (ASCORBIC ACID) 500 MG tablet Take 500 mg by mouth daily.  . [DISCONTINUED] ciprofloxacin (CIPRO) 500 MG tablet Take 1 tablet (500 mg total) by mouth 2 (two) times daily. (Patient not taking: Reported on 11/05/2015)  . [DISCONTINUED] metroNIDAZOLE (FLAGYL)  250 MG tablet Take 250 mg by mouth 3 (three) times daily.  . [DISCONTINUED] metroNIDAZOLE (FLAGYL) 500 MG tablet Take 1 tablet (500 mg total) by mouth 2 (two) times daily. (Patient not taking: Reported on 11/05/2015)  . [DISCONTINUED] Probiotic Product (PROBIOTIC COLON SUPPORT PO) Take 1 capsule by mouth. Take 1 tab daily.  . [DISCONTINUED] vancomycin (VANCOCIN) 50 mg/mL oral solution 125mg / 2.5 cc by mouth four times a day x 14 days (Patient not taking: Reported on 11/05/2015)   No facility-administered encounter medications on file as of 11/05/2015.    Allergies  Allergen Reactions  . Codeine Nausea And Vomiting  . Lexapro [Escitalopram] Diarrhea    headache   Patient Active Problem List   Diagnosis Date Noted  . Lower abdominal pain 08/12/2015  . Lichen simplex chronicus 04/08/2015  . Osteopenia 04/08/2015  . Depression 03/05/2015  . Cystocele 05/11/2014  . 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 ALLERGIC RHINITIS, SEASONAL 02/03/2008  . DIVERTICULOSIS, COLON 02/03/2008  . IRRITABLE BOWEL SYNDROME 02/03/2008  . TRIGGER FINGER 02/03/2008   Social History   Social History  . Marital Status: Married    Spouse Name: N/A  . Number of Children: 2  . Years of Education: 12   Occupational History  . business Freight forwarder    Social History Main Topics  . Smoking status: Never Smoker   . Smokeless tobacco: Never Used  . Alcohol Use: No  . Drug Use: No  . Sexual Activity:    Partners: Male    Birth Control/ Protection: Surgical   Other Topics Concern  . Not on file   Social History Narrative   HSG. Married '63. 1 son- 75'; 1-daughter-'68; grandchildren 3. 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 Marriage in good health. 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 Provided lead to [email or web address].    Gloria Werner family history includes Asthma in her mother; Cancer in her brother and brother; Cancer (age of onset: 11) in her father; Coronary artery disease in her brother; Emphysema in her mother; Lymphoma in her mother. There is no history of Diabetes, Hypertension, or Colon cancer.      Objective:    Filed Vitals:   11/05/15 1351  BP: 132/78  Pulse: 72    Physical Exam  well-developed older white female in no acute distress, pleasant accompanied by her husband blood pressure 132/78 pulse 72 height 5 foot weight 144. HEENT; nontraumatic normocephalic EOMI PERRLA sclera anicteric, Cardiovascular; regular rate and rhythm with S1-S2 no murmur or gallop, Pulmonary  ;clear bilaterally, Abdomen ;soft, bowel sounds are present he is basically nontender no palpable mass or hepatosplenomegaly bowel sounds are present, Rectal; exam not done, Extremities ;no clubbing cyanosis or edema skin warm and dry, Neuropsych; mood and affect appropriate       Assessment & Plan:   #1 74 yo female with recent Cdiff colitis and Diverticulitis- much improved, completed antibiotics about one weeks ago #2 prior hx of CDiff  X 1   Plan; pt wants stool rechecked for Cdiff- will wait a couple weeks and repeat  CDiff  PCR Continue Florastor BID x one month, then may continue once daily therafter  She knows to call for advice for any recurrence of diarrhea of LLQ pain.   Veneda Kirksey Genia Harold PA-C 11/05/2015   Cc: Jearld Fenton, NP

## 2015-11-06 NOTE — Progress Notes (Signed)
Agree, though repeat C.diff not recommended in asymptomatic individuals

## 2015-11-29 DIAGNOSIS — H2513 Age-related nuclear cataract, bilateral: Secondary | ICD-10-CM | POA: Diagnosis not present

## 2015-11-30 ENCOUNTER — Telehealth: Payer: Self-pay | Admitting: Internal Medicine

## 2015-11-30 NOTE — Telephone Encounter (Signed)
Always a risk of Clostridium difficile with antibiotics. No proven prophylactic strategies. She might take probiotic Florastor before and for a few weeks after, could possibly help.

## 2015-11-30 NOTE — Telephone Encounter (Signed)
Spoke with pt and she is aware.

## 2015-11-30 NOTE — Telephone Encounter (Signed)
Pt states she is going to have to have a tooth extracted. She has just gotten over cdiff about 3 weeks ago and wants to know what Dr. Henrene Pastor thinks about having this done. She will see the oral surgeon on Monday but is concerned about being put on antibiotics again. Please advise.

## 2016-01-17 ENCOUNTER — Ambulatory Visit (INDEPENDENT_AMBULATORY_CARE_PROVIDER_SITE_OTHER): Payer: Medicare Other | Admitting: Nurse Practitioner

## 2016-01-17 ENCOUNTER — Encounter: Payer: Self-pay | Admitting: Nurse Practitioner

## 2016-01-17 VITALS — BP 146/98 | HR 84 | Ht 60.0 in | Wt 146.0 lb

## 2016-01-17 DIAGNOSIS — Z01419 Encounter for gynecological examination (general) (routine) without abnormal findings: Secondary | ICD-10-CM | POA: Diagnosis not present

## 2016-01-17 DIAGNOSIS — M8580 Other specified disorders of bone density and structure, unspecified site: Secondary | ICD-10-CM | POA: Diagnosis not present

## 2016-01-17 DIAGNOSIS — Z0000 Encounter for general adult medical examination without abnormal findings: Secondary | ICD-10-CM

## 2016-01-17 DIAGNOSIS — N898 Other specified noninflammatory disorders of vagina: Secondary | ICD-10-CM

## 2016-01-17 DIAGNOSIS — Z205 Contact with and (suspected) exposure to viral hepatitis: Secondary | ICD-10-CM | POA: Diagnosis not present

## 2016-01-17 MED ORDER — ESTRADIOL 2 MG VA RING: 2.0000 mg | VAGINAL_RING | VAGINAL | Status: DC

## 2016-01-17 MED ORDER — VITAMIN D (ERGOCALCIFEROL) 1.25 MG (50000 UNIT) PO CAPS: 50000.0000 [IU] | ORAL_CAPSULE | ORAL | Status: DC

## 2016-01-17 MED ORDER — ESTROGENS, CONJUGATED 0.625 MG/GM VA CREA: 1.0000 | TOPICAL_CREAM | Freq: Every day | VAGINAL | Status: DC

## 2016-01-17 MED ORDER — TRIAMCINOLONE ACETONIDE 0.5 % EX OINT: TOPICAL_OINTMENT | CUTANEOUS | Status: DC

## 2016-01-17 NOTE — Progress Notes (Signed)
Patient ID: Gloria Werner, female   DOB: [birth year], 75 y.o.   MRN: [id number]  75 y.o. [id number] Married  Caucasian Fe here for annual exam.  She is doing well with soft pessary other than the Estring and the pessary will not stay in place together.  She has left the Estring out.  No vaginal bleeding or spotting from the granulation tissue unless has a harder BM.  She is able to remove her pessary and clean it on her own.  It is now out since last pm. This past year they built a deck and put in a hot tub.  She has had 5 falls just being outside and helping.  No major injury other than a puncture wound in the left leg where there was a piece of wood that had to be removed.  With the falls she did not suffer a broken bone.  Patient's last menstrual period was 12/25/1986 (approximate).          Sexually active: Yes.    The current method of family planning is post menopausal status.    Exercising: Yes.    Home exercise routine includes walking and weight baring exercises.. Smoker:  no  Health Maintenance: Pap: 10/02/05, Negative MMG: 01/08/15, Bi-Rads 1: Negative will scheduled Colonoscopy:  09/01/13, adenomatous polyp, repeat in 5 years BMD:  11/2011, 0.4 Spine /-1.7 Right Femur Neck / 0.7 Left Femur Neck TDaP:  10/22/12 Shingles: 11/28/12 Pneumonia: 02/18/14 Hep C: done today Labs: PCP in [facility]   reports that she has never smoked. She has never used smokeless tobacco. She reports that she does not drink alcohol or use illicit drugs.  Past Medical History  Diagnosis Date  . Allergic rhinitis due to pollen   . Trigger finger (acquired)   . Diverticulitis of colon (without mention of hemorrhage)   . Abdominal pain, left lower quadrant   . Irritable bowel syndrome   . Diverticulosis of colon (without mention of hemorrhage)   . Cystocele     uses pessary  . 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 Abdominal hysterectomy  1988    secondary to  prolapse  . Tonsillectomy    . Nasal sinus surgery  1970  . Bladder suspension      A-P with Hyst    Current Outpatient Prescriptions  Medication Sig Dispense Refill  . calcium carbonate (OS-CAL) 600 MG TABS Take 600 mg by mouth 2 (two) times daily with a meal.      . conjugated estrogens (PREMARIN) vaginal cream Place 1 Applicatorful vaginally daily. Use 1/2 g vaginally at night 3 x week for use with pessary 60 g 3  . dicyclomine (BENTYL) 20 MG tablet Take 1 tablet (20 mg total) by mouth every 6 (six) hours. 60 tablet 0  . Multiple Vitamin (MULTIVITAMIN) tablet Take 1 tablet by mouth daily.      . Pyridoxine HCl (B-6 PO) Take 1 tablet by mouth daily.    Marland Kitchen saccharomyces boulardii (FLORASTOR) 250 MG capsule Take 2 capsules (500 mg total) by mouth 2 (two) times daily. 120 capsule 3  . triamcinolone (NASACORT) 55 MCG/ACT AERO nasal inhaler Place 2 sprays into the nose daily.    Marland Kitchen triamcinolone ointment (KENALOG) 0.5 % Apply BID for up to 7 days with flare. 30 g 12  . vitamin C (ASCORBIC ACID) 500 MG tablet Take 500 mg by mouth daily.    . Vitamin D, Ergocalciferol, (DRISDOL) 50000 units  CAPS capsule Take 1 capsule (50,000 Units total) by mouth every 30 (thirty) days. 30 capsule 3   No current facility-administered medications for this visit.    Family History  Problem Relation Age of Onset  . Emphysema Mother   . Lymphoma Mother   . Asthma Mother   . Cancer Father 12    lung cancer  . Coronary artery disease Brother   . Cancer Brother     breast cancer  . Diabetes Neg Hx   . Hypertension Neg Hx   . Colon cancer Neg Hx   . Cancer Brother     lung cancer    ROS:  Pertinent items are noted in HPI.  Otherwise, a comprehensive ROS was negative.  Exam:   BP 144/92 mmHg  Pulse 84  Ht 5' (1.524 m)  Wt 146 lb (66.225 kg)  BMI 28.51 kg/m2  LMP 12/25/1986 (Approximate) Height: 5' (152.4 cm) Ht Readings from Last 3 Encounters:  01/17/16 5' (1.524 m)  11/05/15 5' (1.524 m)   10/21/15 5' (1.524 m)    General appearance: alert, cooperative and appears stated age Head: Normocephalic, without obvious abnormality, atraumatic Neck: no adenopathy, supple, symmetrical, trachea midline and thyroid normal to inspection and palpation Lungs: clear to auscultation bilaterally Breasts: normal appearance, no masses or tenderness Heart: regular rate and rhythm Abdomen: soft, non-tender; no masses,  no organomegaly Extremities: extremities normal, atraumatic, no cyanosis or edema Skin: Skin color, texture, turgor normal. No rashes or lesions Lymph nodes: Cervical, supraclavicular, and axillary nodes normal. No abnormal inguinal nodes palpated Neurologic: Grossly normal   Pelvic: External genitalia:  no lesions              Urethra:  normal appearing urethra with no masses, tenderness or lesions              Bartholin's and Skene's: normal                 Vagina: there remains some granulation tissue at 9-12 o'clock and at 3 o'clock positions, but it is better at most areas with use of the soft pessary size # 5.  The Estring and pessary are currently out.  These areas X 3 are treated with the Silver Nitrate and she did well. Less bleeding noted today as well.              Cervix: absent              Pap taken: No. Bimanual Exam:  Uterus:  uterus absent              Adnexa: no mass, fullness, tenderness               Rectovaginal: Confirms               Anus:  normal sphincter tone, no lesions  Chaperone present: yes  A:  Well Woman with normal exam  S/P Vaginal hysterectomy secondary to prolapse 1988 History of Enterocele with use of a Pessary for Cystocele # 5 softPessaryring with support since 07/23/15 (prior use of a harder type) History of LSA Vaginal vault granulation tissue ongoing - treated today  Osteopenia with recent falls and no fracture.   P:   Reviewed health and wellness pertinent to exam  Pap smear as  above  Mammogram is due now and will schedule along with BMD  She will stop the Estring since unable to use the soft pessary and the ring together  New RX for  Premarin vaginal cream 1/2 gm HS -  3 times a week  If any persistence in bleeding or pain to call.  Counseled on breast self exam, mammography screening, use and side effects of HRT, adequate intake of calcium and vitamin D, diet and exercise, Kegel's exercises return annually or prn  An After Visit Summary was printed and given to the patient.

## 2016-01-17 NOTE — Patient Instructions (Signed)

## 2016-01-18 ENCOUNTER — Telehealth: Payer: Self-pay

## 2016-01-18 LAB — HEPATITIS C ANTIBODY: HCV AB: NEGATIVE

## 2016-01-18 MED ORDER — ESTROGENS, CONJUGATED 0.625 MG/GM VA CREA: TOPICAL_CREAM | VAGINAL | Status: DC

## 2016-01-18 NOTE — Telephone Encounter (Signed)
Received fax from pharmacy for Premarin Cream stating "there are 2 sets of directions."  The directions are as follows: Place 1 applicatorful vaginally daily.  Use 1/2 g vaginally at night 3 x week for use with pessary.  I spoke to Pattie to get clarification.  She stated that she only wants the patient to use 1/2 g vagianlly at night 3 x week for use with pessary.  She reordered with the correct instruction and sent to pharmacy.  Encounter closed.

## 2016-01-23 NOTE — Progress Notes (Signed)
I think I should see pt.  Please let her know and make appt.  Thanks.  Reviewed personally.  Felipa Emory, MD.

## 2016-01-23 NOTE — Progress Notes (Signed)
Patient is aware that I was going to consult with Dr. Sabra Heck about the vaginal lesions - Dr. Sabra Heck wants to see her again and recheck these areas.  Can you make her an apt.

## 2016-01-24 ENCOUNTER — Telehealth: Payer: Self-pay | Admitting: Emergency Medicine

## 2016-01-24 ENCOUNTER — Other Ambulatory Visit: Payer: Self-pay | Admitting: Nurse Practitioner

## 2016-01-24 DIAGNOSIS — Z1231 Encounter for screening mammogram for malignant neoplasm of breast: Secondary | ICD-10-CM

## 2016-01-24 NOTE — Telephone Encounter (Signed)
Patient scheduled with Dr. Sabra Heck for 01/25/16 at 1:00 PM. Patient has additional questions for nurse.

## 2016-01-24 NOTE — Telephone Encounter (Signed)
Megan Salon, MD at 01/23/2016 9:56 AM     Status: Signed       Expand All Collapse All   I think I should see pt. Please let her know and make appt. Thanks. Reviewed personally. Felipa Emory, MD.             Kem Boroughs, FNP at 01/23/2016 8:25 PM     Status: Signed       Expand All Collapse All   Patient is aware that I was going to consult with Dr. Sabra Heck about the vaginal lesions - Dr. Sabra Heck wants to see her again and recheck these areas. Can you make her an apt.

## 2016-01-24 NOTE — Telephone Encounter (Signed)
Call to patient. She is advised that Dr. Sabra Heck would like to see her for follow up regarding areas that were treated with silver nitrate ather last exam. Patient states she has not had any changes and this has been a usual occurrence for her but is agreeable to visit with Dr. Sabra Heck.  Patient has appointment 01/25/16 at 1300 with Dr. Sabra Heck.  Routing to provider for final review. Patient agreeable to disposition. Will close encounter.

## 2016-01-24 NOTE — Telephone Encounter (Deleted)
-----   Message from Kem Boroughs, [place] sent at 01/23/2016  8:28 PM EST -----   ----- Message -----    From: Megan Salon, MD    Sent: 01/23/2016   9:56 AM      To: Kem Boroughs, FNP    ----- Message -----    From: Kem Boroughs, FNP    Sent: 01/17/2016  12:51 PM      To: Megan Salon, MD

## 2016-01-24 NOTE — Telephone Encounter (Signed)
Message left to return call to Saumya Hukill at [phone].    

## 2016-01-25 ENCOUNTER — Ambulatory Visit (INDEPENDENT_AMBULATORY_CARE_PROVIDER_SITE_OTHER): Payer: Medicare Other | Admitting: Obstetrics & Gynecology

## 2016-01-25 ENCOUNTER — Encounter: Payer: Self-pay | Admitting: Obstetrics & Gynecology

## 2016-01-25 VITALS — BP 138/80 | HR 72 | Resp 16 | Ht 60.0 in | Wt 147.0 lb

## 2016-01-25 DIAGNOSIS — K469 Unspecified abdominal hernia without obstruction or gangrene: Secondary | ICD-10-CM | POA: Diagnosis not present

## 2016-01-25 DIAGNOSIS — Z9289 Personal history of other medical treatment: Secondary | ICD-10-CM

## 2016-01-25 DIAGNOSIS — N898 Other specified noninflammatory disorders of vagina: Secondary | ICD-10-CM | POA: Diagnosis not present

## 2016-01-25 DIAGNOSIS — N993 Prolapse of vaginal vault after hysterectomy: Secondary | ICD-10-CM | POA: Diagnosis not present

## 2016-01-25 DIAGNOSIS — N816 Rectocele: Secondary | ICD-10-CM

## 2016-01-25 DIAGNOSIS — Z960 Presence of urogenital implants: Secondary | ICD-10-CM

## 2016-01-25 NOTE — Progress Notes (Signed)
Subjective:     Patient ID: Gloria Werner, female   DOB: 08-02-1941, 75 y.o.   MRN: [id number]  HPI Very nice 75 yo G2P2 MWF here for re-evaluation after seeing Kem Boroughs for AEX 01/17/16.  Pt has known enterocele and associated rectocele with hx of hysterectomy due to prolapse.  Pt uses Pessary, incontinence ring with support, but often has bleeding and is treated typically at AEX with silver nitrate.  Pt's husband and pt both asked at visit last week if there was "something else" that could be causing this and felt some extra reassurance would be good.  Appt was then scheduled with me.  Pt has never taken out her pessary for any extended period of time.  She does use some vaginal estrogen cream.  Basically, once the pessary is out for a few days, she has 4th degree prolapse where she can feel the bulge outside the vagina.  Then she puts the pessary right back in.  She was prescribed a softer pessary last year and this has helped the bleeding significantly.  Does have urinary urgency.  Has easier bowel movements with the pessary in place.  Has some pink discharge after her exam last week but no vaginal bleeding.    Review of Systems  All other systems reviewed and are negative.      Objective:   Physical Exam  Constitutional: She is oriented to person, place, and time. She appears well-developed and well-nourished.  Abdominal: Soft. Bowel sounds are normal.  Genitourinary: There is no rash, tenderness, lesion or injury on the right labia. There is no rash, tenderness, lesion or injury on the left labia.  Uterus and cervix are surgically absent.  A nickel sized area of granulation tissue is midline and slightly inferior to where vaginal cuff line is located.  This does not bleeding on touching with scopette.  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  Lymphadenopathy:       Right: No inguinal adenopathy present.       Left: No inguinal adenopathy present.   Neurological: She is alert and oriented to person, place, and time.  Skin: Skin is warm and dry.  Psychiatric: She has a normal mood and affect.   Pessary was removed by pt prior to gyn exam.  Do not feel any biopsies are necessary.  This finding is very consistent with granulation tissue.    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  I feel the granulation tissue could really heal and go away if she did. Regular use of the vaginal estrogen cream encouraged, especially while the pessary is out. Also, encouraged pt to consider leaving out the pessary at night since she can remove it and replace it without difficulty Reassured pt about granulation tissue.

## 2016-01-30 DIAGNOSIS — N898 Other specified noninflammatory disorders of vagina: Secondary | ICD-10-CM | POA: Insufficient documentation

## 2016-01-30 DIAGNOSIS — N816 Rectocele: Secondary | ICD-10-CM | POA: Insufficient documentation

## 2016-01-30 DIAGNOSIS — K469 Unspecified abdominal hernia without obstruction or gangrene: Secondary | ICD-10-CM | POA: Insufficient documentation

## 2016-01-30 DIAGNOSIS — Z960 Presence of urogenital implants: Secondary | ICD-10-CM | POA: Insufficient documentation

## 2016-01-30 DIAGNOSIS — N993 Prolapse of vaginal vault after hysterectomy: Secondary | ICD-10-CM | POA: Insufficient documentation

## 2016-02-14 DIAGNOSIS — M9901 Segmental and somatic dysfunction of cervical region: Secondary | ICD-10-CM | POA: Diagnosis not present

## 2016-02-14 DIAGNOSIS — M542 Cervicalgia: Secondary | ICD-10-CM | POA: Diagnosis not present

## 2016-02-14 DIAGNOSIS — M9903 Segmental and somatic dysfunction of lumbar region: Secondary | ICD-10-CM | POA: Diagnosis not present

## 2016-02-14 DIAGNOSIS — M545 Low back pain: Secondary | ICD-10-CM | POA: Diagnosis not present

## 2016-02-16 ENCOUNTER — Ambulatory Visit
Admission: RE | Admit: 2016-02-16 | Discharge: 2016-02-16 | Disposition: A | Discharge status: Home / Self Care | Payer: Medicare Other | Source: Ambulatory Visit | Attending: Nurse Practitioner | Admitting: Nurse Practitioner

## 2016-02-16 DIAGNOSIS — M85852 Other specified disorders of bone density and structure, left thigh: Secondary | ICD-10-CM | POA: Diagnosis not present

## 2016-02-16 DIAGNOSIS — Z1231 Encounter for screening mammogram for malignant neoplasm of breast: Secondary | ICD-10-CM | POA: Diagnosis not present

## 2016-02-16 DIAGNOSIS — M8580 Other specified disorders of bone density and structure, unspecified site: Secondary | ICD-10-CM

## 2016-02-24 DIAGNOSIS — M545 Low back pain: Secondary | ICD-10-CM | POA: Diagnosis not present

## 2016-02-24 DIAGNOSIS — M9901 Segmental and somatic dysfunction of cervical region: Secondary | ICD-10-CM | POA: Diagnosis not present

## 2016-02-24 DIAGNOSIS — M542 Cervicalgia: Secondary | ICD-10-CM | POA: Diagnosis not present

## 2016-02-24 DIAGNOSIS — M9903 Segmental and somatic dysfunction of lumbar region: Secondary | ICD-10-CM | POA: Diagnosis not present

## 2016-03-29 DIAGNOSIS — Z08 Encounter for follow-up examination after completed treatment for malignant neoplasm: Secondary | ICD-10-CM | POA: Diagnosis not present

## 2016-03-29 DIAGNOSIS — L579 Skin changes due to chronic exposure to nonionizing radiation, unspecified: Secondary | ICD-10-CM | POA: Diagnosis not present

## 2016-03-29 DIAGNOSIS — L570 Actinic keratosis: Secondary | ICD-10-CM | POA: Diagnosis not present

## 2016-03-29 DIAGNOSIS — Z85828 Personal history of other malignant neoplasm of skin: Secondary | ICD-10-CM | POA: Diagnosis not present

## 2016-03-29 DIAGNOSIS — D225 Melanocytic nevi of trunk: Secondary | ICD-10-CM | POA: Diagnosis not present

## 2016-04-06 DIAGNOSIS — M9904 Segmental and somatic dysfunction of sacral region: Secondary | ICD-10-CM | POA: Diagnosis not present

## 2016-04-06 DIAGNOSIS — M5136 Other intervertebral disc degeneration, lumbar region: Secondary | ICD-10-CM | POA: Diagnosis not present

## 2016-04-06 DIAGNOSIS — M5441 Lumbago with sciatica, right side: Secondary | ICD-10-CM | POA: Diagnosis not present

## 2016-04-06 DIAGNOSIS — M9903 Segmental and somatic dysfunction of lumbar region: Secondary | ICD-10-CM | POA: Diagnosis not present

## 2016-04-11 ENCOUNTER — Encounter: Payer: Self-pay | Admitting: Internal Medicine

## 2016-04-13 DIAGNOSIS — M5136 Other intervertebral disc degeneration, lumbar region: Secondary | ICD-10-CM | POA: Diagnosis not present

## 2016-04-13 DIAGNOSIS — M9904 Segmental and somatic dysfunction of sacral region: Secondary | ICD-10-CM | POA: Diagnosis not present

## 2016-04-13 DIAGNOSIS — M5441 Lumbago with sciatica, right side: Secondary | ICD-10-CM | POA: Diagnosis not present

## 2016-04-13 DIAGNOSIS — M9903 Segmental and somatic dysfunction of lumbar region: Secondary | ICD-10-CM | POA: Diagnosis not present

## 2016-04-17 DIAGNOSIS — M5441 Lumbago with sciatica, right side: Secondary | ICD-10-CM | POA: Diagnosis not present

## 2016-04-17 DIAGNOSIS — M9903 Segmental and somatic dysfunction of lumbar region: Secondary | ICD-10-CM | POA: Diagnosis not present

## 2016-04-17 DIAGNOSIS — M9904 Segmental and somatic dysfunction of sacral region: Secondary | ICD-10-CM | POA: Diagnosis not present

## 2016-04-17 DIAGNOSIS — M5136 Other intervertebral disc degeneration, lumbar region: Secondary | ICD-10-CM | POA: Diagnosis not present

## 2016-04-20 DIAGNOSIS — M5441 Lumbago with sciatica, right side: Secondary | ICD-10-CM | POA: Diagnosis not present

## 2016-04-20 DIAGNOSIS — M9904 Segmental and somatic dysfunction of sacral region: Secondary | ICD-10-CM | POA: Diagnosis not present

## 2016-04-20 DIAGNOSIS — M5136 Other intervertebral disc degeneration, lumbar region: Secondary | ICD-10-CM | POA: Diagnosis not present

## 2016-04-20 DIAGNOSIS — M9903 Segmental and somatic dysfunction of lumbar region: Secondary | ICD-10-CM | POA: Diagnosis not present

## 2016-04-25 DIAGNOSIS — M5136 Other intervertebral disc degeneration, lumbar region: Secondary | ICD-10-CM | POA: Diagnosis not present

## 2016-04-25 DIAGNOSIS — M9904 Segmental and somatic dysfunction of sacral region: Secondary | ICD-10-CM | POA: Diagnosis not present

## 2016-04-25 DIAGNOSIS — M9903 Segmental and somatic dysfunction of lumbar region: Secondary | ICD-10-CM | POA: Diagnosis not present

## 2016-04-25 DIAGNOSIS — M5441 Lumbago with sciatica, right side: Secondary | ICD-10-CM | POA: Diagnosis not present

## 2016-04-27 DIAGNOSIS — M5136 Other intervertebral disc degeneration, lumbar region: Secondary | ICD-10-CM | POA: Diagnosis not present

## 2016-04-27 DIAGNOSIS — M5441 Lumbago with sciatica, right side: Secondary | ICD-10-CM | POA: Diagnosis not present

## 2016-04-27 DIAGNOSIS — M9904 Segmental and somatic dysfunction of sacral region: Secondary | ICD-10-CM | POA: Diagnosis not present

## 2016-04-27 DIAGNOSIS — M9903 Segmental and somatic dysfunction of lumbar region: Secondary | ICD-10-CM | POA: Diagnosis not present

## 2016-05-02 DIAGNOSIS — M9903 Segmental and somatic dysfunction of lumbar region: Secondary | ICD-10-CM | POA: Diagnosis not present

## 2016-05-02 DIAGNOSIS — M5136 Other intervertebral disc degeneration, lumbar region: Secondary | ICD-10-CM | POA: Diagnosis not present

## 2016-05-02 DIAGNOSIS — M9904 Segmental and somatic dysfunction of sacral region: Secondary | ICD-10-CM | POA: Diagnosis not present

## 2016-05-02 DIAGNOSIS — M5441 Lumbago with sciatica, right side: Secondary | ICD-10-CM | POA: Diagnosis not present

## 2016-05-09 DIAGNOSIS — M5441 Lumbago with sciatica, right side: Secondary | ICD-10-CM | POA: Diagnosis not present

## 2016-05-09 DIAGNOSIS — M9903 Segmental and somatic dysfunction of lumbar region: Secondary | ICD-10-CM | POA: Diagnosis not present

## 2016-05-09 DIAGNOSIS — M5136 Other intervertebral disc degeneration, lumbar region: Secondary | ICD-10-CM | POA: Diagnosis not present

## 2016-05-09 DIAGNOSIS — M9904 Segmental and somatic dysfunction of sacral region: Secondary | ICD-10-CM | POA: Diagnosis not present

## 2016-05-16 DIAGNOSIS — M9903 Segmental and somatic dysfunction of lumbar region: Secondary | ICD-10-CM | POA: Diagnosis not present

## 2016-05-16 DIAGNOSIS — M5136 Other intervertebral disc degeneration, lumbar region: Secondary | ICD-10-CM | POA: Diagnosis not present

## 2016-05-16 DIAGNOSIS — M5441 Lumbago with sciatica, right side: Secondary | ICD-10-CM | POA: Diagnosis not present

## 2016-05-16 DIAGNOSIS — M9904 Segmental and somatic dysfunction of sacral region: Secondary | ICD-10-CM | POA: Diagnosis not present

## 2016-05-30 DIAGNOSIS — M9904 Segmental and somatic dysfunction of sacral region: Secondary | ICD-10-CM | POA: Diagnosis not present

## 2016-05-30 DIAGNOSIS — M5136 Other intervertebral disc degeneration, lumbar region: Secondary | ICD-10-CM | POA: Diagnosis not present

## 2016-05-30 DIAGNOSIS — M9903 Segmental and somatic dysfunction of lumbar region: Secondary | ICD-10-CM | POA: Diagnosis not present

## 2016-05-30 DIAGNOSIS — M5441 Lumbago with sciatica, right side: Secondary | ICD-10-CM | POA: Diagnosis not present

## 2016-05-31 DIAGNOSIS — H2513 Age-related nuclear cataract, bilateral: Secondary | ICD-10-CM | POA: Diagnosis not present

## 2016-06-20 DIAGNOSIS — M5441 Lumbago with sciatica, right side: Secondary | ICD-10-CM | POA: Diagnosis not present

## 2016-06-20 DIAGNOSIS — M9904 Segmental and somatic dysfunction of sacral region: Secondary | ICD-10-CM | POA: Diagnosis not present

## 2016-06-20 DIAGNOSIS — M9903 Segmental and somatic dysfunction of lumbar region: Secondary | ICD-10-CM | POA: Diagnosis not present

## 2016-06-20 DIAGNOSIS — M5136 Other intervertebral disc degeneration, lumbar region: Secondary | ICD-10-CM | POA: Diagnosis not present

## 2016-06-21 ENCOUNTER — Encounter: Payer: Self-pay | Admitting: Internal Medicine

## 2016-06-21 ENCOUNTER — Ambulatory Visit (INDEPENDENT_AMBULATORY_CARE_PROVIDER_SITE_OTHER): Payer: Medicare Other | Admitting: Internal Medicine

## 2016-06-21 VITALS — BP 132/70 | HR 73 | Temp 99.0°F | Ht 60.0 in | Wt 146.5 lb

## 2016-06-21 DIAGNOSIS — IMO0002 Reserved for concepts with insufficient information to code with codable children: Secondary | ICD-10-CM

## 2016-06-21 DIAGNOSIS — M8580 Other specified disorders of bone density and structure, unspecified site: Secondary | ICD-10-CM

## 2016-06-21 DIAGNOSIS — K582 Mixed irritable bowel syndrome: Secondary | ICD-10-CM | POA: Diagnosis not present

## 2016-06-21 DIAGNOSIS — J301 Allergic rhinitis due to pollen: Secondary | ICD-10-CM

## 2016-06-21 DIAGNOSIS — Z0000 Encounter for general adult medical examination without abnormal findings: Secondary | ICD-10-CM

## 2016-06-21 DIAGNOSIS — N8110 Cystocele, unspecified: Secondary | ICD-10-CM | POA: Diagnosis not present

## 2016-06-21 DIAGNOSIS — L280 Lichen simplex chronicus: Secondary | ICD-10-CM | POA: Diagnosis not present

## 2016-06-21 LAB — CBC
HCT: 42 % (ref 36.0–46.0)
Hemoglobin: 13.8 g/dL (ref 12.0–15.0)
MCHC: 32.8 g/dL (ref 30.0–36.0)
MCV: 88.7 fl (ref 78.0–100.0)
Platelets: 205 10*3/uL (ref 150.0–400.0)
RBC: 4.73 Mil/uL (ref 3.87–5.11)
RDW: 14.4 % (ref 11.5–15.5)
WBC: 6.6 10*3/uL (ref 4.0–10.5)

## 2016-06-21 LAB — COMPREHENSIVE METABOLIC PANEL
ALT: 21 U/L (ref 0–35)
AST: 31 U/L (ref 0–37)
Albumin: 4.4 g/dL (ref 3.5–5.2)
Alkaline Phosphatase: 48 U/L (ref 39–117)
BILIRUBIN TOTAL: 0.5 mg/dL (ref 0.2–1.2)
BUN: 14 mg/dL (ref 6–23)
CO2: 30 meq/L (ref 19–32)
CREATININE: 0.67 mg/dL (ref 0.40–1.20)
Calcium: 9.8 mg/dL (ref 8.4–10.5)
Chloride: 104 mEq/L (ref 96–112)
GFR: 91.81 mL/min (ref >60.00)
GLUCOSE: 92 mg/dL (ref 70–99)
Potassium: 4.5 mEq/L (ref 3.5–5.1)
Sodium: 139 mEq/L (ref 135–145)
Total Protein: 7.5 g/dL (ref 6.0–8.3)

## 2016-06-21 LAB — VITAMIN D 25 HYDROXY (VIT D DEFICIENCY, FRACTURES): VITD: 39.84 ng/mL (ref 30.00–100.00)

## 2016-06-21 LAB — LIPID PANEL
CHOL/HDL RATIO: 3
Cholesterol: 200 mg/dL (ref 0–200)
HDL: 58.6 mg/dL (ref >39.00)
LDL Cholesterol: 126 mg/dL — ABNORMAL HIGH (ref 0–99)
NONHDL: 140.9
Triglycerides: 74 mg/dL (ref 0.0–149.0)
VLDL: 14.8 mg/dL (ref 0.0–40.0)

## 2016-06-21 NOTE — Assessment & Plan Note (Signed)
Continue Nasocort prn

## 2016-06-21 NOTE — Patient Instructions (Signed)

## 2016-06-21 NOTE — Assessment & Plan Note (Signed)
Continue Florastor and fiber supplement

## 2016-06-21 NOTE — Assessment & Plan Note (Signed)
Continue pessary.  

## 2016-06-21 NOTE — Assessment & Plan Note (Signed)
Continue Clobetasol and Kenalog cream

## 2016-06-21 NOTE — Assessment & Plan Note (Signed)
Continue Calcium and Vit D supplement 

## 2016-06-21 NOTE — Progress Notes (Signed)
Pre visit review using our clinic review tool, if applicable. No additional management support is needed unless otherwise documented below in the visit note. 

## 2016-06-21 NOTE — Progress Notes (Signed)
HPI:  Pt presents to the clinic today for her Medicare Wellness Exam. She is also here for 6 month followup of chronic medical conditions.  Allergic Rhinitis: Controlled. Takes Nasocort daily.  Depression: This has improved. She weaned herself off the Zoloft because she was gaining weight. She was not able to tolerate the side effects from the Lexapro. She denies SI/HI.  IBS: Alternating diarrhea and constipation. No recent issues. She takes Publishing copy and a fiber supplement daily.  Cystocele: She uses a pessary.  Lichen simplex chronicus: Uses Clobetasol and Triamcinolone cream when it flares.  Osteopenia: Bone density from 01/2016 reviewed. She is taking Calcium and Vit D supplements.  Past Medical History  Diagnosis Date  . Allergic rhinitis due to pollen   . Trigger finger (acquired)   . Diverticulitis of colon (without mention of hemorrhage)   . Abdominal pain, left lower quadrant   . Irritable bowel syndrome   . Diverticulosis of colon (without mention of hemorrhage)   . Cystocele     uses pessary  . 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 calcium carbonate (OS-CAL) 600 MG TABS Take 600 mg by mouth 2 (two) times daily with a meal.      . conjugated estrogens (PREMARIN) vaginal cream Use 1/2 g vaginally at night 3 x week for use with pessary 60 g 3  . Multiple Vitamin (MULTIVITAMIN) tablet Take 1 tablet by mouth daily.      . Pyridoxine HCl (B-6 PO) Take 1 tablet by mouth daily.    Marland Kitchen saccharomyces boulardii (FLORASTOR) 250 MG capsule Take 2 capsules (500 mg total) by mouth 2 (two) times daily. 120 capsule 3  . triamcinolone (NASACORT) 55 MCG/ACT AERO nasal inhaler Place 2 sprays into the nose daily.    Marland Kitchen triamcinolone ointment (KENALOG) 0.5 % Apply BID for up to 7 days with flare. 30 g 12  . vitamin C (ASCORBIC ACID) 500 MG tablet Take 500 mg by mouth daily.    . Vitamin D, Ergocalciferol, (DRISDOL) 50000 units  CAPS capsule Take 1 capsule (50,000 Units total) by mouth every 30 (thirty) days. 30 capsule 3   No current facility-administered medications for this visit.    Allergies  Allergen Reactions  . Codeine Nausea And Vomiting  . Lexapro [Escitalopram] Diarrhea    headache    Family History  Problem Relation Age of Onset  . Emphysema Mother   . Lymphoma Mother   . Asthma Mother   . Cancer Father 35    lung cancer  . Coronary artery disease Brother   . Cancer Brother     breast cancer  . Diabetes Neg Hx   . Hypertension Neg Hx   . Colon cancer Neg Hx   . Cancer Brother     lung cancer    Social History   Social History  . Marital Status: Married    Spouse Name: N/A  . Number of Children: 2  . Years of Education: 12   Occupational History  . business Freight forwarder    Social History Main Topics  . Smoking status: Never Smoker   . Smokeless tobacco: Never Used  . Alcohol Use: No  . Drug Use: No  . Sexual Activity:    Partners: Male    Birth Control/ Protection: Surgical   Other Topics Concern  . Not on file   Social History Narrative   HSG. Married '63. 1 son- 49'; 1-daughter-'68; grandchildren 3. Work;  helps with family business; works for [employer], cleaning business. Marriage in good health. 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 Provided lead to [email or web address].    Hospitiliaztions: None  Health Maintenance:    Flu: 09/2015  Tetanus: 09/2012  Pneumovax: 09/2012  Prevnar: 01/2014  Zostavax: 11/2012  Mammogram: 01/2016  Pap Smear: 2006 s/p hysterectomy  Bone Density: 01/2016  Colonoscopy: 08/2013 (5 years)  Eye Doctor: 10/2015  Dental Exam: biannually  Providers:   PCP: Webb Silversmith, NP-C  GYN: Milford Cage, FNP  Gastroenterology: Dr. Henrene Pastor  Opthalmologist: Dr. Katy Fitch   I have personally reviewed and have noted:  1. The patient's medical and social history 2. Their use of alcohol, tobacco or illicit  drugs 3. Their current medications and supplements 4. The patient's functional ability including ADL's, fall risks, home  safety risks and hearing or visual impairment. 5. Diet and physical activities 6. Evidence for depression or mood disorder  Subjective:   Review of Systems:   Constitutional: Denies fatigue, fever, malaise, headache or abrupt weight changes.  HEENT: Denies eye pain, eye redness, ear pain, ringing in the ears, wax buildup, runny nose, nasal congestion, bloody nose, or sore throat. Respiratory: Denies difficulty breathing, shortness of breath, cough or sputum production.   Cardiovascular: Denies chest pain, chest tightness, palpitations or swelling in the hands or feet.  Gastrointestinal: Pt reports alternating constipation and diarrhea. Denies abdominal pain, bloating, or blood in the stool.  GU: Denies urgency, frequency, pain with urination, burning sensation, blood in urine, odor or discharge. Musculoskeletal: Denies decrease in range of motion, difficulty with gait, muscle pain or joint pain and swelling.  Skin: Denies redness, rashes, lesions or ulcercations.  Neurological: Denies dizziness, difficulty with memory, difficulty with speech or problems with balance and coordination.  Psych: Denies depression, anxiety, SI/HI.  No other specific complaints in a complete review of systems (except as listed in HPI above).  Objective:  PE:   LMP 12/25/1986 (Approximate)  Wt Readings from Last 3 Encounters:  01/25/16 147 lb (66.679 kg)  01/17/16 146 lb (66.225 kg)  11/05/15 144 lb 0.4 oz (65.329 kg)    General: Appears her stated age, well developed, well nourished in NAD. Skin: Warm, dry and intact. Scaly lesion to left forearm. HEENT: Head: normal shape and size; Nose: mucosa pink and moist, septum midline; Throat/Mouth: Teeth present, mucosa pink and moist, no exudate, lesions or ulcerations noted.  Cardiovascular: Normal rate and rhythm. S1,S2 noted.  No  murmur, rubs or gallops noted. No JVD or BLE edema. No carotid bruits noted. Pulmonary/Chest: Normal effort and positive vesicular breath sounds. No respiratory distress. No wheezes, rales or ronchi noted.  Abdomen: Soft and nontender. Normal bowel sounds. No distention or masses noted. Liver, spleen and kidneys non palpable. Neurological: Alert and oriented.  Psychiatric: Mood and affect normal. Behavior is normal. Judgment and thought content normal.    BMET    Component Value Date/Time   NA 138 10/21/2015 1421   K 3.6 10/21/2015 1421   CL 101 10/21/2015 1421   CO2 30 10/21/2015 1421   GLUCOSE 137* 10/21/2015 1421   BUN 10 10/21/2015 1421   CREATININE 0.73 10/21/2015 1421   CALCIUM 9.1 10/21/2015 1421   GFRNONAA 90 02/26/2008 1019   GFRAA 108 02/26/2008 1019    Lipid Panel     Component Value Date/Time   CHOL 191 04/08/2015 0904   TRIG 55.0 04/08/2015 0904   HDL 56.20 04/08/2015 0904  CHOLHDL 3 04/08/2015 0904   VLDL 11.0 04/08/2015 0904   LDLCALC 124* 04/08/2015 0904    CBC    Component Value Date/Time   WBC 13.6* 10/21/2015 1421   RBC 4.85 10/21/2015 1421   HGB 14.2 10/21/2015 1421   HCT 43.2 10/21/2015 1421   PLT 229.0 10/21/2015 1421   MCV 89.0 10/21/2015 1421   MCHC 32.9 10/21/2015 1421   RDW 13.1 10/21/2015 1421   LYMPHSABS 1.0 10/21/2015 1421   MONOABS 1.0 10/21/2015 1421   EOSABS 0.2 10/21/2015 1421   BASOSABS 0.1 10/21/2015 1421    Hgb A1C No results found for: HGBA1C    Assessment and Plan:   Medicare Annual Wellness Visit:  Diet: Balanced meals, tries to avoid fried foods Physical activity: Walks on treadmill, elliptical 2 days a week Depression/mood screen: Negative Hearing: Intact to whispered voice Visual acuity: Grossly normal, performs annual eye exam  ADLs: Capable Fall risk: None Home safety: Good Cognitive evaluation: Intact to orientation, naming, recall and repetition EOL planning: Living Will, full code/ I  agree  Preventative Medicine: All UTD, Colonoscopy due 2019  Next appointment: 1 year ago

## 2016-07-18 DIAGNOSIS — M5136 Other intervertebral disc degeneration, lumbar region: Secondary | ICD-10-CM | POA: Diagnosis not present

## 2016-07-18 DIAGNOSIS — M9903 Segmental and somatic dysfunction of lumbar region: Secondary | ICD-10-CM | POA: Diagnosis not present

## 2016-07-18 DIAGNOSIS — M9904 Segmental and somatic dysfunction of sacral region: Secondary | ICD-10-CM | POA: Diagnosis not present

## 2016-07-18 DIAGNOSIS — M5441 Lumbago with sciatica, right side: Secondary | ICD-10-CM | POA: Diagnosis not present

## 2016-08-22 DIAGNOSIS — M5441 Lumbago with sciatica, right side: Secondary | ICD-10-CM | POA: Diagnosis not present

## 2016-08-22 DIAGNOSIS — M5136 Other intervertebral disc degeneration, lumbar region: Secondary | ICD-10-CM | POA: Diagnosis not present

## 2016-08-22 DIAGNOSIS — M9904 Segmental and somatic dysfunction of sacral region: Secondary | ICD-10-CM | POA: Diagnosis not present

## 2016-08-22 DIAGNOSIS — M9903 Segmental and somatic dysfunction of lumbar region: Secondary | ICD-10-CM | POA: Diagnosis not present

## 2016-09-19 DIAGNOSIS — M9904 Segmental and somatic dysfunction of sacral region: Secondary | ICD-10-CM | POA: Diagnosis not present

## 2016-09-19 DIAGNOSIS — M5136 Other intervertebral disc degeneration, lumbar region: Secondary | ICD-10-CM | POA: Diagnosis not present

## 2016-09-19 DIAGNOSIS — M9903 Segmental and somatic dysfunction of lumbar region: Secondary | ICD-10-CM | POA: Diagnosis not present

## 2016-09-19 DIAGNOSIS — M5441 Lumbago with sciatica, right side: Secondary | ICD-10-CM | POA: Diagnosis not present

## 2016-10-17 DIAGNOSIS — M5136 Other intervertebral disc degeneration, lumbar region: Secondary | ICD-10-CM | POA: Diagnosis not present

## 2016-10-17 DIAGNOSIS — M5441 Lumbago with sciatica, right side: Secondary | ICD-10-CM | POA: Diagnosis not present

## 2016-10-17 DIAGNOSIS — M9904 Segmental and somatic dysfunction of sacral region: Secondary | ICD-10-CM | POA: Diagnosis not present

## 2016-10-17 DIAGNOSIS — M9903 Segmental and somatic dysfunction of lumbar region: Secondary | ICD-10-CM | POA: Diagnosis not present

## 2016-11-27 DIAGNOSIS — M545 Low back pain: Secondary | ICD-10-CM | POA: Diagnosis not present

## 2016-11-27 DIAGNOSIS — M9903 Segmental and somatic dysfunction of lumbar region: Secondary | ICD-10-CM | POA: Diagnosis not present

## 2016-11-27 DIAGNOSIS — M542 Cervicalgia: Secondary | ICD-10-CM | POA: Diagnosis not present

## 2016-11-27 DIAGNOSIS — M9901 Segmental and somatic dysfunction of cervical region: Secondary | ICD-10-CM | POA: Diagnosis not present

## 2017-01-01 DIAGNOSIS — M9905 Segmental and somatic dysfunction of pelvic region: Secondary | ICD-10-CM | POA: Diagnosis not present

## 2017-01-01 DIAGNOSIS — M545 Low back pain: Secondary | ICD-10-CM | POA: Diagnosis not present

## 2017-01-01 DIAGNOSIS — M9904 Segmental and somatic dysfunction of sacral region: Secondary | ICD-10-CM | POA: Diagnosis not present

## 2017-01-01 DIAGNOSIS — M9903 Segmental and somatic dysfunction of lumbar region: Secondary | ICD-10-CM | POA: Diagnosis not present

## 2017-01-16 ENCOUNTER — Encounter: Payer: Self-pay | Admitting: Nurse Practitioner

## 2017-01-16 NOTE — Progress Notes (Signed)
Patient ID: DNAE KURMAN, female   DOB: 17-Mar-1941, 76 y.o.   MRN: [id number]  76 y.o. [id number] Married  Caucasian Fe here for annual exam.  Pt is having some problems with her left knee.  She also feels this time of year really makes her blue - history of SAD.  She has been on Lexapro and most recently Zoloft for this.  She did not feel that they helped that much plus she had weight gain.  Wants to try another medication.  She just does not feel herself and denies any insomnia and suicidal ideation. She comes in today with her Pessary size # 5 out with removal this am. She has not had any problems with spotting from her history of granulation tissue.  She does admit to not using the estrogen cream recently. We have been using the soft pessary instead of the hard one since 07/23/15.  Her last treatment of granulation tissue was on 01/17/16.  Patient's last menstrual period was 12/25/1986 (approximate).          Sexually active: Yes.    The current method of family planning is status post hysterectomy.    Exercising: Yes.    Walking Smoker:  no  Health Maintenance: Pap: 10/02/05, Negative MMG: 02/16/16, 3D, Bi-Rads 1: Negative Colonoscopy:  09/01/13, adenomatous polyp, repeat in 5 years BMD: 02/16/16 T Score, -1.1 Right Femur Neck / -1.9 Left Femur Neck / -1.6 Left Radius / Spine not tested TDaP:  10/22/12 Shingles: 11/28/12 Pneumonia: 10/22/12 Pneumovax, 02/18/14 Prevnar-13 Hep C and HIV: Not indicated due to age Labs: PCP takes care of labs (in [facility])       Urine: Trace blood, 1+ WBC, 6                                                                                   pH   reports that she has never smoked. She has never used smokeless tobacco. She reports that she does not drink alcohol or use drugs.  Past Medical History:  Diagnosis Date  . Abdominal pain, left lower quadrant   . Allergic rhinitis due to pollen   . Cystocele    uses pessary  . Diverticulitis of colon (without mention of  hemorrhage)(562.11)   . Diverticulosis of colon (without mention of hemorrhage)   . Irritable bowel syndrome   . 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 Trigger finger (acquired)     Past Surgical History:  Procedure Laterality Date  . ABDOMINAL HYSTERECTOMY  1988   secondary to prolapse  . BLADDER SUSPENSION     A-P with Hyst  . NASAL SINUS SURGERY  1970  . TONSILLECTOMY      Current Outpatient Prescriptions  Medication Sig Dispense Refill  . calcium carbonate (OS-CAL) 600 MG TABS Take 600 mg by mouth 2 (two) times daily with a meal.      . conjugated estrogens (PREMARIN) vaginal cream Use 1/2 g vaginally at night 3 x week for use with pessary 60 g 3  . Multiple Vitamin (MULTIVITAMIN) tablet Take 1 tablet by mouth daily.      . Pyridoxine HCl (B-6 PO) Take 1 tablet by mouth daily.    Marland Kitchen  saccharomyces boulardii (FLORASTOR) 250 MG capsule Take 2 capsules (500 mg total) by mouth 2 (two) times daily. 120 capsule 3  . triamcinolone (NASACORT) 55 MCG/ACT AERO nasal inhaler Place 2 sprays into the nose daily.    Marland Kitchen triamcinolone ointment (KENALOG) 0.5 % Apply BID for up to 7 days with flare. 30 g 12  . vitamin C (ASCORBIC ACID) 500 MG tablet Take 500 mg by mouth daily.    . Vitamin D, Ergocalciferol, (DRISDOL) 50000 units CAPS capsule Take 1 capsule (50,000 Units total) by mouth every 30 (thirty) days. 30 capsule 3   No current facility-administered medications for this visit.     Family History  Problem Relation Age of Onset  . Emphysema Mother   . Lymphoma Mother   . Asthma Mother   . Cancer Father 24    lung cancer  . Coronary artery disease Brother   . Cancer Brother     breast cancer  . Diabetes Neg Hx   . Hypertension Neg Hx   . Colon cancer Neg Hx   . Cancer Brother     lung cancer    ROS:  Pertinent items are noted in HPI.  Otherwise, a comprehensive ROS was negative.  Exam:   BP 140/82 (BP Location: Right Arm, Patient Position: Sitting, Cuff Size: Normal)   Pulse 62    Resp 12   Ht 5' (1.524 m)   Wt 144 lb 9.6 oz (65.6 kg)   LMP 12/25/1986 (Approximate)   BMI 28.24 kg/m  Height: 5' (152.4 cm) Ht Readings from Last 3 Encounters:  01/17/17 5' (1.524 m)  06/21/16 5' (1.524 m)  01/25/16 5' (1.524 m)    General appearance: alert, cooperative and appears stated age Head: Normocephalic, without obvious abnormality, atraumatic Neck: no adenopathy, supple, symmetrical, trachea midline and thyroid normal to inspection and palpation Lungs: clear to auscultation bilaterally Breasts: normal appearance, no masses or tenderness Heart: regular rate and rhythm Abdomen: soft, non-tender; no masses,  no organomegaly Extremities: extremities normal, atraumatic, no cyanosis or edema Skin: Skin color, texture, turgor normal. No rashes or lesions Lymph nodes: Cervical, supraclavicular, and axillary nodes normal. No abnormal inguinal nodes palpated Neurologic: Grossly normal   Pelvic: External genitalia:  no lesions              Urethra:  normal appearing urethra with no masses, tenderness or lesions              Bartholin's and Skene's: normal                 Vagina: slight more atrophic appearing vagina with a lot less granulation tissue than last year.  2 areas of fingerlike projects at 1:00 & 1:30 positions that is treated with Silver Nitrate.  But overall so much better              Cervix: absent              Pap taken: No. Bimanual Exam:  Uterus:  uterus absent              Adnexa: no mass, fullness, tenderness               Rectovaginal: Confirms               Anus:  normal sphincter tone, no lesions  Chaperone present: yes  A:  Well Woman with normal exam      S/P Vaginal hysterectomy secondary to prolapse 1988 History of Enterocele with  use of a Pessary for Cystocele # 5 softPessaryring with support since 07/23/15 (prior use of a harder type) History of LSA Vaginal vault granulation tissue ongoing - treated today              Osteopenia with falls last year and no fracture.  History  of SAD (seasonal affective disorder)   P:   Reviewed health and wellness pertinent to exam  Pap smear not done  Mammogram is due 01/2017  She is instructed to use her vaginal estrogen cream as directed. refill sent to the pharmacy.  Counseled on risk of DVT, CVA, cancer, etc  Refill on Vit D and will follow lab  Will try her on Wellbutrin for SAD - she is to give [name] a progress report in 3 month.  She is read the potential side effects and precautions.  If she has any sign or symptoms of worsening of depression to stop med's and call.  Counseled on breast self exam, mammography screening, use and side effects of HRT, osteoporosis, adequate intake of calcium and vitamin D, diet and exercise, Kegel's exercises return annually or prn  An After Visit Summary was printed and given to the patient.

## 2017-01-17 ENCOUNTER — Encounter: Payer: Self-pay | Admitting: Nurse Practitioner

## 2017-01-17 ENCOUNTER — Ambulatory Visit (INDEPENDENT_AMBULATORY_CARE_PROVIDER_SITE_OTHER): Payer: Medicare Other | Admitting: Nurse Practitioner

## 2017-01-17 VITALS — BP 140/82 | HR 62 | Resp 12 | Ht 60.0 in | Wt 144.6 lb

## 2017-01-17 DIAGNOSIS — R8290 Unspecified abnormal findings in urine: Secondary | ICD-10-CM

## 2017-01-17 DIAGNOSIS — Z01411 Encounter for gynecological examination (general) (routine) with abnormal findings: Secondary | ICD-10-CM

## 2017-01-17 DIAGNOSIS — N993 Prolapse of vaginal vault after hysterectomy: Secondary | ICD-10-CM

## 2017-01-17 DIAGNOSIS — F339 Major depressive disorder, recurrent, unspecified: Secondary | ICD-10-CM

## 2017-01-17 DIAGNOSIS — M85852 Other specified disorders of bone density and structure, left thigh: Secondary | ICD-10-CM | POA: Diagnosis not present

## 2017-01-17 DIAGNOSIS — Z960 Presence of urogenital implants: Secondary | ICD-10-CM

## 2017-01-17 DIAGNOSIS — N898 Other specified noninflammatory disorders of vagina: Secondary | ICD-10-CM

## 2017-01-17 DIAGNOSIS — F419 Anxiety disorder, unspecified: Secondary | ICD-10-CM | POA: Insufficient documentation

## 2017-01-17 DIAGNOSIS — F32A Depression, unspecified: Secondary | ICD-10-CM | POA: Insufficient documentation

## 2017-01-17 DIAGNOSIS — Z9289 Personal history of other medical treatment: Secondary | ICD-10-CM

## 2017-01-17 DIAGNOSIS — E559 Vitamin D deficiency, unspecified: Secondary | ICD-10-CM | POA: Diagnosis not present

## 2017-01-17 DIAGNOSIS — F338 Other recurrent depressive disorders: Secondary | ICD-10-CM

## 2017-01-17 DIAGNOSIS — Z0000 Encounter for general adult medical examination without abnormal findings: Secondary | ICD-10-CM

## 2017-01-17 LAB — POCT URINALYSIS DIPSTICK
BILIRUBIN UA: NEGATIVE
GLUCOSE UA: NEGATIVE
Ketones, UA: NEGATIVE
NITRITE UA: NEGATIVE
PH UA: 6
Protein, UA: NEGATIVE
UROBILINOGEN UA: NEGATIVE

## 2017-01-17 MED ORDER — BUPROPION HCL 75 MG PO TABS: 75.0000 mg | ORAL_TABLET | Freq: Two times a day (BID) | ORAL | 0 refills | Status: DC

## 2017-01-17 MED ORDER — ESTROGENS, CONJUGATED 0.625 MG/GM VA CREA: TOPICAL_CREAM | VAGINAL | 3 refills | Status: DC

## 2017-01-17 MED ORDER — VITAMIN D (ERGOCALCIFEROL) 1.25 MG (50000 UNIT) PO CAPS: 50000.0000 [IU] | ORAL_CAPSULE | ORAL | 3 refills | Status: DC

## 2017-01-17 MED ORDER — TRIAMCINOLONE ACETONIDE 0.5 % EX OINT: TOPICAL_OINTMENT | CUTANEOUS | 12 refills | Status: DC

## 2017-01-17 NOTE — Patient Instructions (Signed)

## 2017-01-18 ENCOUNTER — Telehealth: Payer: Self-pay | Admitting: Nurse Practitioner

## 2017-01-18 LAB — VITAMIN D 25 HYDROXY (VIT D DEFICIENCY, FRACTURES): Vit D, 25-Hydroxy: 45 ng/mL (ref 30–100)

## 2017-01-18 LAB — URINE CULTURE: Organism ID, Bacteria: NO GROWTH

## 2017-01-18 NOTE — Telephone Encounter (Signed)
I would like to have Edman Circle review this prescription and the dosing for clarification.  I will forward this message to her to review tomorrow.  [place]

## 2017-01-18 NOTE — Telephone Encounter (Signed)
Patient has a question regarding her prescription.  °

## 2017-01-18 NOTE — Telephone Encounter (Signed)
Spoke with patient. Patient states she was in to see Kem Boroughs, NP 01/17/17 for AEX and was started on Wellbutrin. Patient states when she discussed medication with Precious Bard she was advised not to take medication in evening or at night so that it would not keep her up at night. Patient picked up prescription and wanted to clarify instructions on package "take 1 tablet, 75 mg by mouth  twice a day". Patient states she would like to clarify this since they discussed taking it early. Verified order entered by Kem Boroughs, NP. Advised patient Kem Boroughs, NP is out of the office today, will review with covering provider and return call with recommendations, patient is agreeable.  Dr. Quincy Simmonds -please advise?  [phone] Raquel Sarna, NP

## 2017-01-19 NOTE — Telephone Encounter (Signed)
Patient is called and left a voice mail to call back. No other information is given.   When I first looked at up to date for Wellbutrin to treat SAD it was for Wellbutrin XL 150 mg.  We then reviewed web site and looked at the potential side effects and decided to start with the dose of Wellbutrin 75 mg.  The immediate dose can be 2-3 times a day.  She may start out maybe twice a day at first.  But still keep in touch to increase or change to XL dosing.

## 2017-01-21 NOTE — Progress Notes (Signed)
Encounter reviewed by Dr. Amylee Lodato Amundson C. Silva.  

## 2017-01-22 NOTE — Telephone Encounter (Signed)
Spoke with patient, advised as seen below per Kem Boroughs, NP. Patient states she has been taking in morning with breakfast and again around 4-5pm. Patient states she is not experiencing any insomnia. Patient verbalizes understanding and is agreeable.  Routing to provider for final review. Patient is agreeable to disposition. Will close encounter.

## 2017-01-24 ENCOUNTER — Ambulatory Visit (INDEPENDENT_AMBULATORY_CARE_PROVIDER_SITE_OTHER): Payer: Medicare Other | Admitting: Internal Medicine

## 2017-01-24 ENCOUNTER — Ambulatory Visit (INDEPENDENT_AMBULATORY_CARE_PROVIDER_SITE_OTHER)
Admission: RE | Admit: 2017-01-24 | Discharge: 2017-01-24 | Disposition: A | Discharge status: Home / Self Care | Payer: Medicare Other | Source: Ambulatory Visit | Attending: Internal Medicine | Admitting: Internal Medicine

## 2017-01-24 ENCOUNTER — Encounter: Payer: Self-pay | Admitting: Internal Medicine

## 2017-01-24 VITALS — BP 124/78 | HR 79 | Temp 97.8°F | Wt 143.5 lb

## 2017-01-24 DIAGNOSIS — M25562 Pain in left knee: Secondary | ICD-10-CM | POA: Diagnosis not present

## 2017-01-24 DIAGNOSIS — M179 Osteoarthritis of knee, unspecified: Secondary | ICD-10-CM | POA: Diagnosis not present

## 2017-01-24 NOTE — Patient Instructions (Signed)

## 2017-01-24 NOTE — Progress Notes (Signed)
Subjective:    Patient ID: Gloria Werner, female    DOB: June 22, 1941, 76 y.o.   MRN: [id number]  HPI  Pt presents to the clinic today with c/o pain behind her left knee. This started 2 weeks ago. It got better after 1 week but pain came back 2 days ago, after tripping over a vacuum cord. She describes the pain as constant and achy. It is worse with walking. She has noticed some mild swelling. She has taken Ibuprofen with minimal relief. She had an xray 02/2014 which showed spurring at the lateral joint line, otherwise normal.   Review of Systems      Past Medical History:  Diagnosis Date  . Abdominal pain, left lower quadrant   . Allergic rhinitis due to pollen   . Cystocele    uses pessary  . Diverticulitis of colon (without mention of hemorrhage)(562.11)   . Diverticulosis of colon (without mention of hemorrhage)   . Irritable bowel syndrome   . 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 Trigger finger (acquired)     Current Outpatient Prescriptions  Medication Sig Dispense Refill  . buPROPion (WELLBUTRIN) 75 MG tablet Take 1 tablet (75 mg total) by mouth 2 (two) times daily. 90 tablet 0  . calcium carbonate (OS-CAL) 600 MG TABS Take 600 mg by mouth 2 (two) times daily with a meal.      . conjugated estrogens (PREMARIN) vaginal cream Use 1/2 g vaginally at night 3 x week for use with pessary 60 g 3  . Multiple Vitamin (MULTIVITAMIN) tablet Take 1 tablet by mouth daily.      . Pyridoxine HCl (B-6 PO) Take 1 tablet by mouth daily.    Marland Kitchen saccharomyces boulardii (FLORASTOR) 250 MG capsule Take 2 capsules (500 mg total) by mouth 2 (two) times daily. 120 capsule 3  . triamcinolone (NASACORT) 55 MCG/ACT AERO nasal inhaler Place 2 sprays into the nose daily.    Marland Kitchen triamcinolone ointment (KENALOG) 0.5 % Apply BID for up to 7 days with flare. 15 g 12  . vitamin C (ASCORBIC ACID) 500 MG tablet Take 500 mg by mouth daily.    . Vitamin D, Ergocalciferol, (DRISDOL) 50000 units CAPS capsule Take 1  capsule (50,000 Units total) by mouth every 30 (thirty) days. 30 capsule 3   No current facility-administered medications for this visit.     Allergies  Allergen Reactions  . Codeine Nausea And Vomiting  . Lexapro [Escitalopram] Diarrhea    headache    Family History  Problem Relation Age of Onset  . Emphysema Mother   . Lymphoma Mother   . Asthma Mother   . Cancer Father 76    lung cancer  . Coronary artery disease Brother   . Cancer Brother     breast cancer  . Cancer Brother     lung cancer  . Diabetes Neg Hx   . Hypertension Neg Hx   . Colon cancer Neg Hx     Social History   Social History  . Marital status: Married    Spouse name: N/A  . Number of children: 2  . Years of education: 12   Occupational History  . business Freight forwarder    Social History Main Topics  . Smoking status: Never Smoker  . Smokeless tobacco: Never Used  . Alcohol use No  . Drug use: No  . Sexual activity: Yes    Partners: Male    Birth control/ protection: Surgical   Other Topics Concern  .  Not on file   Social History Narrative   HSG. Married '63. 1 son- 28'; 1-daughter-'68; grandchildren 3. 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 Marriage in good health. 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 Provided lead to [email or web address].     Constitutional: Denies fever, malaise, fatigue, headache or abrupt weight changes.  Musculoskeletal: Pt reports left knee pain. Denies decrease in range of motion, difficulty with gait, muscle pain.    No other specific complaints in a complete review of systems (except as listed in HPI above).  Objective:   Physical Exam    BP 124/78   Pulse 79   Temp 97.8 F (36.6 C) (Oral)   Wt 143 lb 8 oz (65.1 kg)   LMP 12/25/1986 (Approximate)   SpO2 98%   BMI 28.03 kg/m  Wt Readings from Last 3 Encounters:  01/24/17 143 lb 8 oz (65.1 kg)  01/17/17 144 lb 9.6 oz (65.6  kg)  06/21/16 146 lb 8 oz (66.5 kg)    General: Appears her stated age, in NAD. Musculoskeletal: Normal flexion and extension of the left knee. No swelling noted. Pain with palpation over the lateral joint line. No abnormality noted of the popliteal fossa. Negative Lachman's. Negative McMurray. No difficulty with gait. Able to walk on tippy toes and heels.   Neurological: Alert and oriented.   BMET    Component Value Date/Time   NA 139 06/21/2016 1322   K 4.5 06/21/2016 1322   CL 104 06/21/2016 1322   CO2 30 06/21/2016 1322   GLUCOSE 92 06/21/2016 1322   BUN 14 06/21/2016 1322   CREATININE 0.67 06/21/2016 1322   CALCIUM 9.8 06/21/2016 1322   GFRNONAA 90 02/26/2008 1019   GFRAA 108 02/26/2008 1019    Lipid Panel     Component Value Date/Time   CHOL 200 06/21/2016 1322   TRIG 74.0 06/21/2016 1322   HDL 58.60 06/21/2016 1322   CHOLHDL 3 06/21/2016 1322   VLDL 14.8 06/21/2016 1322   LDLCALC 126 (H) 06/21/2016 1322    CBC    Component Value Date/Time   WBC 6.6 06/21/2016 1322   RBC 4.73 06/21/2016 1322   HGB 13.8 06/21/2016 1322   HCT 42.0 06/21/2016 1322   PLT 205.0 06/21/2016 1322   MCV 88.7 06/21/2016 1322   MCHC 32.8 06/21/2016 1322   RDW 14.4 06/21/2016 1322   LYMPHSABS 1.0 10/21/2015 1421   MONOABS 1.0 10/21/2015 1421   EOSABS 0.2 10/21/2015 1421   BASOSABS 0.1 10/21/2015 1421    Hgb A1C No results found for: HGBA1C        Assessment & Plan:   Left Knee Pain:  Xray left knee today Advised her to increase Ibuprofen to 600 mg BID prn Ice may be helpful Knee exercises given  Will follow up after xray, RTC as needed Webb Silversmith, NP

## 2017-01-29 DIAGNOSIS — M9904 Segmental and somatic dysfunction of sacral region: Secondary | ICD-10-CM | POA: Diagnosis not present

## 2017-01-29 DIAGNOSIS — M545 Low back pain: Secondary | ICD-10-CM | POA: Diagnosis not present

## 2017-01-29 DIAGNOSIS — M9905 Segmental and somatic dysfunction of pelvic region: Secondary | ICD-10-CM | POA: Diagnosis not present

## 2017-01-29 DIAGNOSIS — M9903 Segmental and somatic dysfunction of lumbar region: Secondary | ICD-10-CM | POA: Diagnosis not present

## 2017-01-30 ENCOUNTER — Other Ambulatory Visit: Payer: Self-pay | Admitting: Nurse Practitioner

## 2017-01-30 DIAGNOSIS — Z1231 Encounter for screening mammogram for malignant neoplasm of breast: Secondary | ICD-10-CM

## 2017-02-20 ENCOUNTER — Telehealth: Payer: Self-pay | Admitting: Nurse Practitioner

## 2017-02-20 MED ORDER — BUPROPION HCL 75 MG PO TABS: 75.0000 mg | ORAL_TABLET | Freq: Two times a day (BID) | ORAL | 1 refills | Status: DC

## 2017-02-20 NOTE — Telephone Encounter (Signed)
Spoke with patient. Patient states she was prescribed Wellbutrin at 01/17/17 AEX. Patient states medication is working well. Patient states initially discussed taking daily, but taking bid as prescribed (see telephone encounter dated 01/18/17). Patient states she will need refill on medication, she just picked up last of prescription for 15 day supply on 2/24. Advised patient will update Kem Boroughs, NP and call with recommendations, patient is agreeable.  Kem Boroughs, NP -please advise on refill for Wellbutrin.

## 2017-02-20 NOTE — Telephone Encounter (Signed)
She may have refill on Wellbutrin 75 mg BID. Since she is doing well will still need to consult at about 3 months.

## 2017-02-20 NOTE — Telephone Encounter (Signed)
Left message to call Sharee Pimple at [phone].  Needs to schedule 3 month f/u around 04/17/17. Rx for Wellbutrin to pharmacy.

## 2017-02-20 NOTE — Telephone Encounter (Signed)
Spoke with patient, advised RX to pharmacy. Patient scheduled for 3 month f/u 04/16/17 at 10:15am. Patient is agreeable to date and time.  Routing to provider for final review. Patient is agreeable to disposition. Will close encounter.

## 2017-02-20 NOTE — Telephone Encounter (Signed)
Patient wants to talk with the nurse no information given.  °

## 2017-03-12 DIAGNOSIS — M9904 Segmental and somatic dysfunction of sacral region: Secondary | ICD-10-CM | POA: Diagnosis not present

## 2017-03-12 DIAGNOSIS — M9905 Segmental and somatic dysfunction of pelvic region: Secondary | ICD-10-CM | POA: Diagnosis not present

## 2017-03-12 DIAGNOSIS — M9903 Segmental and somatic dysfunction of lumbar region: Secondary | ICD-10-CM | POA: Diagnosis not present

## 2017-03-12 DIAGNOSIS — M545 Low back pain: Secondary | ICD-10-CM | POA: Diagnosis not present

## 2017-03-14 ENCOUNTER — Ambulatory Visit: Payer: Self-pay

## 2017-04-11 ENCOUNTER — Ambulatory Visit
Admission: RE | Admit: 2017-04-11 | Discharge: 2017-04-11 | Disposition: A | Discharge status: Home / Self Care | Payer: Medicare Other | Source: Ambulatory Visit | Attending: Nurse Practitioner | Admitting: Nurse Practitioner

## 2017-04-11 DIAGNOSIS — Z1231 Encounter for screening mammogram for malignant neoplasm of breast: Secondary | ICD-10-CM | POA: Diagnosis not present

## 2017-04-16 ENCOUNTER — Ambulatory Visit (INDEPENDENT_AMBULATORY_CARE_PROVIDER_SITE_OTHER): Payer: Medicare Other | Admitting: Nurse Practitioner

## 2017-04-16 ENCOUNTER — Encounter: Payer: Self-pay | Admitting: Nurse Practitioner

## 2017-04-16 VITALS — BP 148/84 | HR 64 | Ht 60.0 in | Wt 146.0 lb

## 2017-04-16 DIAGNOSIS — M9903 Segmental and somatic dysfunction of lumbar region: Secondary | ICD-10-CM | POA: Diagnosis not present

## 2017-04-16 DIAGNOSIS — F339 Major depressive disorder, recurrent, unspecified: Secondary | ICD-10-CM

## 2017-04-16 DIAGNOSIS — M9905 Segmental and somatic dysfunction of pelvic region: Secondary | ICD-10-CM | POA: Diagnosis not present

## 2017-04-16 DIAGNOSIS — M545 Low back pain: Secondary | ICD-10-CM | POA: Diagnosis not present

## 2017-04-16 DIAGNOSIS — M9904 Segmental and somatic dysfunction of sacral region: Secondary | ICD-10-CM | POA: Diagnosis not present

## 2017-04-16 DIAGNOSIS — F338 Other recurrent depressive disorders: Secondary | ICD-10-CM

## 2017-04-16 MED ORDER — BUPROPION HCL 75 MG PO TABS: 75.0000 mg | ORAL_TABLET | Freq: Two times a day (BID) | ORAL | 3 refills | Status: DC

## 2017-04-16 NOTE — Progress Notes (Signed)
Follow up visit:   HPI: 76 y.o.   Married  Caucasian  female   G2P2002 with Patient's last menstrual period was 12/25/1986 (approximate).   here for follow up on use of antidepressant med's for SAD.   She was started on Wellbutrin 75 mg BID around 01/17/17.   She has felt that this time of year really makes her blue - history of SAD.  In the past she has been on Lexapro and most recently before her visit in January also was on Zoloft for this.  She did not feel that they helped that much plus she had weight gain.  At that time wanted to try another medication.   Overall feeling less agitated.  Sleep is good, no spells of sadness or crying. Denies suicidal thoughts.  No other health problems.  No vaginal spotting in regards to soft pessary.    GYNECOLOGIC HISTORY: Patient's last menstrual period was 12/25/1986 (approximate).  Menopausal hormone therapy: Premarin vaginal cream only.        OB History    Gravida Para Term Preterm AB Living   2 2 2  0 0 2   SAB TAB Ectopic Multiple Live Births   0 0 0 0 2         Patient Active Problem List   Diagnosis Date Noted  . Seasonal affective disorder ([place]) 01/17/2017  . Vitamin D deficiency 01/17/2017  . Granulation tissue of vaginal cuff 01/30/2016  . 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 Vaginal pessary present 01/30/2016  . Lichen simplex chronicus 04/08/2015  . Osteopenia 04/08/2015  . Depression 03/05/2015  . Cystocele 05/11/2014  . ALLERGIC RHINITIS, SEASONAL 02/03/2008  . DIVERTICULOSIS, COLON 02/03/2008  . IRRITABLE BOWEL SYNDROME 02/03/2008    Past Medical History:  Diagnosis Date  . Abdominal pain, left lower quadrant   . Allergic rhinitis due to pollen   . Cystocele    uses pessary  . Diverticulitis of colon (without mention of hemorrhage)(562.11)   . Diverticulosis of colon (without mention of hemorrhage)   . Irritable bowel syndrome   . 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 Trigger finger (acquired)      Past Surgical History:  Procedure Laterality Date  . ABDOMINAL HYSTERECTOMY  1988   secondary to prolapse  . BLADDER SUSPENSION     A-P with Hyst  . NASAL SINUS SURGERY  1970  . TONSILLECTOMY      Current Outpatient Prescriptions  Medication Sig Dispense Refill  . buPROPion (WELLBUTRIN) 75 MG tablet Take 1 tablet (75 mg total) by mouth 2 (two) times daily. 60 tablet 1  . calcium carbonate (OS-CAL) 600 MG TABS Take 600 mg by mouth 2 (two) times daily with a meal.      . conjugated estrogens (PREMARIN) vaginal cream Use 1/2 g vaginally at night 3 x week for use with pessary 60 g 3  . Multiple Vitamin (MULTIVITAMIN) tablet Take 1 tablet by mouth daily.      . Pyridoxine HCl (B-6 PO) Take 1 tablet by mouth daily.    Marland Kitchen saccharomyces boulardii (FLORASTOR) 250 MG capsule Take 2 capsules (500 mg total) by mouth 2 (two) times daily. 120 capsule 3  . triamcinolone (NASACORT) 55 MCG/ACT AERO nasal inhaler Place 2 sprays into the nose daily.    Marland Kitchen triamcinolone ointment (KENALOG) 0.5 % Apply BID for up to 7 days with flare. 15 g 12  . vitamin C (ASCORBIC ACID) 500 MG tablet Take 500 mg by mouth daily.    Marland Kitchen  Vitamin D, Ergocalciferol, (DRISDOL) 50000 units CAPS capsule Take 1 capsule (50,000 Units total) by mouth every 30 (thirty) days. 30 capsule 3   No current facility-administered medications for this visit.      ALLERGIES: Codeine and Lexapro [escitalopram]  Family History  Problem Relation Age of Onset  . Emphysema Mother   . Lymphoma Mother   . Asthma Mother   . Cancer Father 51    lung cancer  . Coronary artery disease Brother   . Cancer Brother     breast cancer  . Cancer Brother     lung cancer  . Diabetes Neg Hx   . Hypertension Neg Hx   . Colon cancer Neg Hx     Social History   Social History  . Marital status: Married    Spouse name: N/A  . Number of children: 2  . Years of education: 12   Occupational History  . business Freight forwarder    Social History Main  Topics  . Smoking status: Never Smoker  . Smokeless tobacco: Never Used  . Alcohol use No  . Drug use: No  . Sexual activity: Yes    Partners: Male    Birth control/ protection: Surgical   Other Topics Concern  . Not on file   Social History Narrative   HSG. Married '63. 1 son- 68'; 1-daughter-'68; grandchildren 3. 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 Marriage in good health. 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 Provided lead to [email or web address].    Review of Systems  All other systems reviewed and are negative.   PHYSICAL EXAMINATION:    BP (!) 148/84 (BP Location: Right Arm, Patient Position: Sitting, Cuff Size: Normal)   Pulse 64   Ht 5' (1.524 m)   Wt 146 lb (66.2 kg)   LMP 12/25/1986 (Approximate)   BMI 28.51 kg/m     General appearance: alert, cooperative and appears stated age   ASSESSMENT   history of SAD - doing well on medication  PLAN May try to reduce Wellbutrin 75 mg to once a day in June.  She will monitor symptoms and let us know if any flares in depression.   An After Visit Summary was printed and given to the patient.  15 minutes face to face time of which over 90% was spent in counseling.

## 2017-04-16 NOTE — Patient Instructions (Signed)
Follow up at next annual.

## 2017-04-16 NOTE — Progress Notes (Signed)
Encounter reviewed by Dr. Raiya Stainback Amundson C. Silva.  

## 2017-05-14 DIAGNOSIS — M9904 Segmental and somatic dysfunction of sacral region: Secondary | ICD-10-CM | POA: Diagnosis not present

## 2017-05-14 DIAGNOSIS — M9903 Segmental and somatic dysfunction of lumbar region: Secondary | ICD-10-CM | POA: Diagnosis not present

## 2017-05-14 DIAGNOSIS — M9905 Segmental and somatic dysfunction of pelvic region: Secondary | ICD-10-CM | POA: Diagnosis not present

## 2017-05-14 DIAGNOSIS — M545 Low back pain: Secondary | ICD-10-CM | POA: Diagnosis not present

## 2017-06-04 DIAGNOSIS — H43813 Vitreous degeneration, bilateral: Secondary | ICD-10-CM | POA: Diagnosis not present

## 2017-06-04 DIAGNOSIS — H2513 Age-related nuclear cataract, bilateral: Secondary | ICD-10-CM | POA: Diagnosis not present

## 2017-06-04 DIAGNOSIS — H35372 Puckering of macula, left eye: Secondary | ICD-10-CM | POA: Diagnosis not present

## 2017-06-18 DIAGNOSIS — M9903 Segmental and somatic dysfunction of lumbar region: Secondary | ICD-10-CM | POA: Diagnosis not present

## 2017-06-18 DIAGNOSIS — M9904 Segmental and somatic dysfunction of sacral region: Secondary | ICD-10-CM | POA: Diagnosis not present

## 2017-06-18 DIAGNOSIS — M545 Low back pain: Secondary | ICD-10-CM | POA: Diagnosis not present

## 2017-06-18 DIAGNOSIS — M9905 Segmental and somatic dysfunction of pelvic region: Secondary | ICD-10-CM | POA: Diagnosis not present

## 2017-07-23 DIAGNOSIS — M9903 Segmental and somatic dysfunction of lumbar region: Secondary | ICD-10-CM | POA: Diagnosis not present

## 2017-07-23 DIAGNOSIS — M545 Low back pain: Secondary | ICD-10-CM | POA: Diagnosis not present

## 2017-07-23 DIAGNOSIS — M9904 Segmental and somatic dysfunction of sacral region: Secondary | ICD-10-CM | POA: Diagnosis not present

## 2017-07-23 DIAGNOSIS — M9905 Segmental and somatic dysfunction of pelvic region: Secondary | ICD-10-CM | POA: Diagnosis not present

## 2017-08-09 ENCOUNTER — Other Ambulatory Visit: Payer: Self-pay | Admitting: Internal Medicine

## 2017-08-09 DIAGNOSIS — Z0000 Encounter for general adult medical examination without abnormal findings: Secondary | ICD-10-CM

## 2017-08-13 ENCOUNTER — Other Ambulatory Visit (INDEPENDENT_AMBULATORY_CARE_PROVIDER_SITE_OTHER): Payer: Medicare Other

## 2017-08-13 DIAGNOSIS — Z0000 Encounter for general adult medical examination without abnormal findings: Secondary | ICD-10-CM | POA: Diagnosis not present

## 2017-08-13 LAB — CBC
HCT: 41.5 % (ref 36.0–46.0)
Hemoglobin: 13.8 g/dL (ref 12.0–15.0)
MCHC: 33.2 g/dL (ref 30.0–36.0)
MCV: 91.2 fl (ref 78.0–100.0)
Platelets: 183 K/uL (ref 150.0–400.0)
RBC: 4.55 Mil/uL (ref 3.87–5.11)
RDW: 13.3 % (ref 11.5–15.5)
WBC: 4.6 K/uL (ref 4.0–10.5)

## 2017-08-13 LAB — COMPREHENSIVE METABOLIC PANEL WITH GFR
ALT: 16 U/L (ref 0–35)
AST: 25 U/L (ref 0–37)
Albumin: 4 g/dL (ref 3.5–5.2)
Alkaline Phosphatase: 45 U/L (ref 39–117)
BUN: 19 mg/dL (ref 6–23)
CO2: 26 meq/L (ref 19–32)
Calcium: 9.2 mg/dL (ref 8.4–10.5)
Chloride: 108 meq/L (ref 96–112)
Creatinine, Ser: 0.84 mg/dL (ref 0.40–1.20)
GFR: 70.5 mL/min
Glucose, Bld: 87 mg/dL (ref 70–99)
Potassium: 3.9 meq/L (ref 3.5–5.1)
Sodium: 141 meq/L (ref 135–145)
Total Bilirubin: 0.5 mg/dL (ref 0.2–1.2)
Total Protein: 7.3 g/dL (ref 6.0–8.3)

## 2017-08-13 LAB — LIPID PANEL
Cholesterol: 179 mg/dL (ref 0–200)
HDL: 52.5 mg/dL (ref >39.00)
LDL CALC: 112 mg/dL — AB (ref 0–99)
NonHDL: 126.07
Total CHOL/HDL Ratio: 3
Triglycerides: 71 mg/dL (ref 0.0–149.0)
VLDL: 14.2 mg/dL (ref 0.0–40.0)

## 2017-08-13 LAB — VITAMIN D 25 HYDROXY (VIT D DEFICIENCY, FRACTURES): VITD: 31.15 ng/mL (ref 30.00–100.00)

## 2017-08-14 ENCOUNTER — Encounter: Payer: Self-pay | Admitting: Internal Medicine

## 2017-08-14 ENCOUNTER — Ambulatory Visit (INDEPENDENT_AMBULATORY_CARE_PROVIDER_SITE_OTHER): Payer: Medicare Other | Admitting: Internal Medicine

## 2017-08-14 VITALS — BP 130/78 | HR 76 | Temp 98.2°F | Ht 60.0 in | Wt 147.5 lb

## 2017-08-14 DIAGNOSIS — Z0000 Encounter for general adult medical examination without abnormal findings: Secondary | ICD-10-CM | POA: Diagnosis not present

## 2017-08-14 NOTE — Patient Instructions (Signed)
Health Maintenance for Postmenopausal Women Menopause is a normal process in which your reproductive ability comes to an end. This process happens gradually over a span of months to years, usually between the ages of 22 and 9. Menopause is complete when you have missed 12 consecutive menstrual periods. It is important to talk with your health care provider about some of the most common conditions that affect postmenopausal women, such as heart disease, cancer, and bone loss (osteoporosis). Adopting a healthy lifestyle and getting preventive care can help to promote your health and wellness. Those actions can also lower your chances of developing some of these common conditions. What should I know about menopause? During menopause, you may experience a number of symptoms, such as:  Moderate-to-severe hot flashes.  Night sweats.  Decrease in sex drive.  Mood swings.  Headaches.  Tiredness.  Irritability.  Memory problems.  Insomnia.  Choosing to treat or not to treat menopausal changes is an individual decision that you make with your health care provider. What should I know about hormone replacement therapy and supplements? Hormone therapy products are effective for treating symptoms that are associated with menopause, such as hot flashes and night sweats. Hormone replacement carries certain risks, especially as you become older. If you are thinking about using estrogen or estrogen with progestin treatments, discuss the benefits and risks with your health care provider. What should I know about heart disease and stroke? Heart disease, heart attack, and stroke become more likely as you age. This may be due, in part, to the hormonal changes that your body experiences during menopause. These can affect how your body processes dietary fats, triglycerides, and cholesterol. Heart attack and stroke are both medical emergencies. There are many things that you can do to help prevent heart disease  and stroke:  Have your blood pressure checked at least every 1-2 years. High blood pressure causes heart disease and increases the risk of stroke.  If you are 53-22 years old, ask your health care provider if you should take aspirin to prevent a heart attack or a stroke.  Do not use any tobacco products, including cigarettes, chewing tobacco, or electronic cigarettes. If you need help quitting, ask your health care provider.  It is important to eat a healthy diet and maintain a healthy weight. ? Be sure to include plenty of vegetables, fruits, low-fat dairy products, and lean protein. ? Avoid eating foods that are high in solid fats, added sugars, or salt (sodium).  Get regular exercise. This is one of the most important things that you can do for your health. ? Try to exercise for at least 150 minutes each week. The type of exercise that you do should increase your heart rate and make you sweat. This is known as moderate-intensity exercise. ? Try to do strengthening exercises at least twice each week. Do these in addition to the moderate-intensity exercise.  Know your numbers.Ask your health care provider to check your cholesterol and your blood glucose. Continue to have your blood tested as directed by your health care provider.  What should I know about cancer screening? There are several types of cancer. Take the following steps to reduce your risk and to catch any cancer development as early as possible. Breast Cancer  Practice breast self-awareness. ? This means understanding how your breasts normally appear and feel. ? It also means doing regular breast self-exams. Let your health care provider know about any changes, no matter how small.  If you are 40  or older, have a clinician do a breast exam (clinical breast exam or CBE) every year. Depending on your age, family history, and medical history, it may be recommended that you also have a yearly breast X-ray (mammogram).  If you  have a family history of breast cancer, talk with your health care provider about genetic screening.  If you are at high risk for breast cancer, talk with your health care provider about having an MRI and a mammogram every year.  Breast cancer (BRCA) gene test is recommended for women who have family members with BRCA-related cancers. Results of the assessment will determine the need for genetic counseling and BRCA1 and for BRCA2 testing. BRCA-related cancers include these types: ? Breast. This occurs in males or females. ? Ovarian. ? Tubal. This may also be called fallopian tube cancer. ? Cancer of the abdominal or pelvic lining (peritoneal cancer). ? Prostate. ? Pancreatic.  Cervical, Uterine, and Ovarian Cancer Your health care provider may recommend that you be screened regularly for cancer of the pelvic organs. These include your ovaries, uterus, and vagina. This screening involves a pelvic exam, which includes checking for microscopic changes to the surface of your cervix (Pap test).  For women ages 21-65, health care providers may recommend a pelvic exam and a Pap test every three years. For women ages 79-65, they may recommend the Pap test and pelvic exam, combined with testing for human papilloma virus (HPV), every five years. Some types of HPV increase your risk of cervical cancer. Testing for HPV may also be done on women of any age who have unclear Pap test results.  Other health care providers may not recommend any screening for nonpregnant women who are considered low risk for pelvic cancer and have no symptoms. Ask your health care provider if a screening pelvic exam is right for you.  If you have had past treatment for cervical cancer or a condition that could lead to cancer, you need Pap tests and screening for cancer for at least 20 years after your treatment. If Pap tests have been discontinued for you, your risk factors (such as having a new sexual partner) need to be  reassessed to determine if you should start having screenings again. Some women have medical problems that increase the chance of getting cervical cancer. In these cases, your health care provider may recommend that you have screening and Pap tests more often.  If you have a family history of uterine cancer or ovarian cancer, talk with your health care provider about genetic screening.  If you have vaginal bleeding after reaching menopause, tell your health care provider.  There are currently no reliable tests available to screen for ovarian cancer.  Lung Cancer Lung cancer screening is recommended for adults 69-62 years old who are at high risk for lung cancer because of a history of smoking. A yearly low-dose CT scan of the lungs is recommended if you:  Currently smoke.  Have a history of at least 30 pack-years of smoking and you currently smoke or have quit within the past 15 years. A pack-year is smoking an average of one pack of cigarettes per day for one year.  Yearly screening should:  Continue until it has been 15 years since you quit.  Stop if you develop a health problem that would prevent you from having lung cancer treatment.  Colorectal Cancer  This type of cancer can be detected and can often be prevented.  Routine colorectal cancer screening usually begins at  age 42 and continues through age 45.  If you have risk factors for colon cancer, your health care provider may recommend that you be screened at an earlier age.  If you have a family history of colorectal cancer, talk with your health care provider about genetic screening.  Your health care provider may also recommend using home test kits to check for hidden blood in your stool.  A small camera at the end of a tube can be used to examine your colon directly (sigmoidoscopy or colonoscopy). This is done to check for the earliest forms of colorectal cancer.  Direct examination of the colon should be repeated every  5-10 years until age 71. However, if early forms of precancerous polyps or small growths are found or if you have a family history or genetic risk for colorectal cancer, you may need to be screened more often.  Skin Cancer  Check your skin from head to toe regularly.  Monitor any moles. Be sure to tell your health care provider: ? About any new moles or changes in moles, especially if there is a change in a mole's shape or color. ? If you have a mole that is larger than the size of a pencil eraser.  If any of your family members has a history of skin cancer, especially at a young age, talk with your health care provider about genetic screening.  Always use sunscreen. Apply sunscreen liberally and repeatedly throughout the day.  Whenever you are outside, protect yourself by wearing long sleeves, pants, a wide-brimmed hat, and sunglasses.  What should I know about osteoporosis? Osteoporosis is a condition in which bone destruction happens more quickly than new bone creation. After menopause, you may be at an increased risk for osteoporosis. To help prevent osteoporosis or the bone fractures that can happen because of osteoporosis, the following is recommended:  If you are 46-71 years old, get at least 1,000 mg of calcium and at least 600 mg of vitamin D per day.  If you are older than age 55 but younger than age 65, get at least 1,200 mg of calcium and at least 600 mg of vitamin D per day.  If you are older than age 54, get at least 1,200 mg of calcium and at least 800 mg of vitamin D per day.  Smoking and excessive alcohol intake increase the risk of osteoporosis. Eat foods that are rich in calcium and vitamin D, and do weight-bearing exercises several times each week as directed by your health care provider. What should I know about how menopause affects my mental health? Depression may occur at any age, but it is more common as you become older. Common symptoms of depression  include:  Low or sad mood.  Changes in sleep patterns.  Changes in appetite or eating patterns.  Feeling an overall lack of motivation or enjoyment of activities that you previously enjoyed.  Frequent crying spells.  Talk with your health care provider if you think that you are experiencing depression. What should I know about immunizations? It is important that you get and maintain your immunizations. These include:  Tetanus, diphtheria, and pertussis (Tdap) booster vaccine.  Influenza every year before the flu season begins.  Pneumonia vaccine.  Shingles vaccine.  Your health care provider may also recommend other immunizations. This information is not intended to replace advice given to you by your health care provider. Make sure you discuss any questions you have with your health care provider. Document Released: 02/02/2006  Document Revised: 06/30/2016 Document Reviewed: 09/14/2015 Elsevier Interactive Patient Education  2018 [employer]  

## 2017-08-14 NOTE — Progress Notes (Signed)
HPI:  Pt presents to the clinic today for her Medicare Wellness Exam.   Past Medical History:  Diagnosis Date  . Abdominal pain, left lower quadrant   . Allergic rhinitis due to pollen   . Cystocele    uses pessary  . Diverticulitis of colon (without mention of hemorrhage)(562.11)   . Diverticulosis of colon (without mention of hemorrhage)   . Irritable bowel syndrome   . 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 Trigger finger (acquired)     Current Outpatient Prescriptions  Medication Sig Dispense Refill  . buPROPion (WELLBUTRIN) 75 MG tablet Take 1 tablet (75 mg total) by mouth 2 (two) times daily. 180 tablet 3  . calcium carbonate (OS-CAL) 600 MG TABS Take 600 mg by mouth 2 (two) times daily with a meal.      . conjugated estrogens (PREMARIN) vaginal cream Use 1/2 g vaginally at night 3 x week for use with pessary 60 g 3  . Multiple Vitamin (MULTIVITAMIN) tablet Take 1 tablet by mouth daily.      . Pyridoxine HCl (B-6 PO) Take 1 tablet by mouth daily.    Marland Kitchen saccharomyces boulardii (FLORASTOR) 250 MG capsule Take 2 capsules (500 mg total) by mouth 2 (two) times daily. 120 capsule 3  . triamcinolone (NASACORT) 55 MCG/ACT AERO nasal inhaler Place 2 sprays into the nose daily.    Marland Kitchen triamcinolone ointment (KENALOG) 0.5 % Apply BID for up to 7 days with flare. 15 g 12  . vitamin C (ASCORBIC ACID) 500 MG tablet Take 500 mg by mouth daily.    . Vitamin D, Ergocalciferol, (DRISDOL) 50000 units CAPS capsule Take 1 capsule (50,000 Units total) by mouth every 30 (thirty) days. 30 capsule 3   No current facility-administered medications for this visit.     Allergies  Allergen Reactions  . Codeine Nausea And Vomiting  . Lexapro [Escitalopram] Diarrhea    headache    Family History  Problem Relation Age of Onset  . Emphysema Mother   . Lymphoma Mother   . Asthma Mother   . Cancer Father 46       lung cancer  . Coronary artery disease Brother   . Cancer Brother        breast cancer  .  Cancer Brother        lung cancer  . Diabetes Neg Hx   . Hypertension Neg Hx   . Colon cancer Neg Hx     Social History   Social History  . Marital status: Married    Spouse name: N/A  . Number of children: 2  . Years of education: 12   Occupational History  . business Freight forwarder    Social History Main Topics  . Smoking status: Never Smoker  . Smokeless tobacco: Never Used  . Alcohol use No  . Drug use: No  . Sexual activity: Yes    Partners: Male    Birth control/ protection: Surgical   Other Topics Concern  . Not on file   Social History Narrative   HSG. Married '63. 1 son- 80'; 1-daughter-'68; grandchildren 3. 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 Marriage in good health. 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 Provided lead to [email or web address].    Hospitiliaztions: None  Health Maintenance:    Flu: 07/2016  Tetanus: 09/2012  Pneumovax: 09/2012  Prevnar: 01/2014  Zostavax: 11/2012  Mammogram: 03/2017  Pap Smear: 2006, hysterectomy  Bone Density: 01/2016  Colon Screening: 08/2013  Eye Doctor: 05/2017  Dental Exam:   Providers:   PCP: Webb Silversmith, NP-C  Gynecology: Kem Boroughs, FNP  Gastroenterologist: Dr. Henrene Pastor    I have personally reviewed and have noted:  1. The patient's medical and social history 2. Their use of alcohol, tobacco or illicit drugs 3. Their current medications and supplements 4. The patient's functional ability including ADL's, fall risks, home safety risks and hearing or visual impairment. 5. Diet and physical activities 6. Evidence for depression or mood disorder  Subjective:   Review of Systems:   Constitutional: Denies fever, malaise, fatigue, headache or abrupt weight changes.  HEENT: Denies eye pain, eye redness, ear pain, ringing in the ears, wax buildup, runny nose, nasal congestion, bloody nose, or sore throat. Respiratory: Denies difficulty  breathing, shortness of breath, cough or sputum production.   Cardiovascular: Denies chest pain, chest tightness, palpitations or swelling in the hands or feet.  Gastrointestinal: Denies abdominal pain, bloating, constipation, diarrhea or blood in the stool.  GU: Denies urgency, frequency, pain with urination, burning sensation, blood in urine, odor or discharge. Musculoskeletal: Pt reports intermittent joint pain. Denies decrease in range of motion, difficulty with gait, muscle pain or joint swelling.  Skin: Denies redness, rashes, lesions or ulcercations.  Neurological: Denies dizziness, difficulty with memory, difficulty with speech or problems with balance and coordination.  Psych: Denies anxiety, depression, SI/HI.  No other specific complaints in a complete review of systems (except as listed in HPI above).  Objective:  PE:   BP 130/78   Pulse 76   Temp 98.2 F (36.8 C) (Oral)   Ht 5' (1.524 m)   Wt 147 lb 8 oz (66.9 kg)   LMP 12/25/1986 (Approximate)   SpO2 98%   BMI 28.81 kg/m   Wt Readings from Last 3 Encounters:  04/16/17 146 lb (66.2 kg)  01/24/17 143 lb 8 oz (65.1 kg)  01/17/17 144 lb 9.6 oz (65.6 kg)    General: Appears her stated age, well developed, well nourished in NAD. Skin: Warm, dry and intact.  HEENT: Head: normal shape and size; Eyes: sclera white, no icterus, conjunctiva pink, PERRLA and EOMs intact; Ears: Tm's gray and intact, normal light reflex; Throat/Mouth: Teeth present, mucosa pink and moist, no exudate, lesions or ulcerations noted.  Neck: Neck supple, trachea midline. No masses, lumps or thyromegaly present.  Cardiovascular: Normal rate and rhythm. S1,S2 noted.  No murmur, rubs or gallops noted. No JVD or BLE edema. No carotid bruits noted. Pulmonary/Chest: Normal effort and positive vesicular breath sounds. No respiratory distress. No wheezes, rales or ronchi noted.  Abdomen: Soft and nontender. Normal bowel sounds. No distention or masses noted.  Liver, spleen and kidneys non palpable. Musculoskeletal:  Strength 5/5 BUE/BLE. No signs of joint swelling.  Neurological: Alert and oriented. Psychiatric: Mood and affect normal. Behavior is normal. Judgment and thought content normal.     BMET    Component Value Date/Time   NA 141 08/13/2017 0842   K 3.9 08/13/2017 0842   CL 108 08/13/2017 0842   CO2 26 08/13/2017 0842   GLUCOSE 87 08/13/2017 0842   BUN 19 08/13/2017 0842   CREATININE 0.84 08/13/2017 0842   CALCIUM 9.2 08/13/2017 0842   GFRNONAA 90 02/26/2008 1019   GFRAA 108 02/26/2008 1019    Lipid Panel     Component Value Date/Time   CHOL 179 08/13/2017 0842   TRIG 71.0 08/13/2017 0842   HDL 52.50 08/13/2017 0842   CHOLHDL 3  08/13/2017 0842   VLDL 14.2 08/13/2017 0842   LDLCALC 112 (H) 08/13/2017 0842    CBC    Component Value Date/Time   WBC 4.6 08/13/2017 0842   RBC 4.55 08/13/2017 0842   HGB 13.8 08/13/2017 0842   HCT 41.5 08/13/2017 0842   PLT 183.0 08/13/2017 0842   MCV 91.2 08/13/2017 0842   MCHC 33.2 08/13/2017 0842   RDW 13.3 08/13/2017 0842   LYMPHSABS 1.0 10/21/2015 1421   MONOABS 1.0 10/21/2015 1421   EOSABS 0.2 10/21/2015 1421   BASOSABS 0.1 10/21/2015 1421    Hgb A1C No results found for: HGBA1C    Assessment and Plan:   Medicare Annual Wellness Visit:  Diet: She eats mostly lean meats. She consumes fruits and veggies daily. She tries to avoid fried food. She drinks mostly water. Physical activity: Walking. Depression/mood screen: Negative Hearing: Intact to whispered voice Visual acuity: Grossly normal, performs annual eye exam  ADLs: Capable Fall risk: None Home safety: Good Cognitive evaluation: Intact to orientation, naming, recall and repetition EOL planning: Adv directives, full code/ I agree  Preventative Medicine: Encouraged her to get a flu shot in the fall. All other immunizations are UTD. Mammogram, bone density and colon screening UTD. She does not need  Pap smears.  Encouraged her to consume a balanced diet and exercise regimen. Advised her to see an eye doctor and dentist annually. Labs from 1 week ago reviewed.   Next appointment: 1 year, Medicare Wellness Exam    Webb Silversmith, NP

## 2017-09-03 DIAGNOSIS — M9905 Segmental and somatic dysfunction of pelvic region: Secondary | ICD-10-CM | POA: Diagnosis not present

## 2017-09-03 DIAGNOSIS — M545 Low back pain: Secondary | ICD-10-CM | POA: Diagnosis not present

## 2017-09-03 DIAGNOSIS — M9904 Segmental and somatic dysfunction of sacral region: Secondary | ICD-10-CM | POA: Diagnosis not present

## 2017-09-03 DIAGNOSIS — M9903 Segmental and somatic dysfunction of lumbar region: Secondary | ICD-10-CM | POA: Diagnosis not present

## 2017-10-22 ENCOUNTER — Ambulatory Visit: Payer: Self-pay | Admitting: Internal Medicine

## 2017-10-22 DIAGNOSIS — M545 Low back pain: Secondary | ICD-10-CM | POA: Diagnosis not present

## 2017-10-22 DIAGNOSIS — M9904 Segmental and somatic dysfunction of sacral region: Secondary | ICD-10-CM | POA: Diagnosis not present

## 2017-10-22 DIAGNOSIS — M9905 Segmental and somatic dysfunction of pelvic region: Secondary | ICD-10-CM | POA: Diagnosis not present

## 2017-10-22 DIAGNOSIS — M9903 Segmental and somatic dysfunction of lumbar region: Secondary | ICD-10-CM | POA: Diagnosis not present

## 2017-11-26 DIAGNOSIS — M9904 Segmental and somatic dysfunction of sacral region: Secondary | ICD-10-CM | POA: Diagnosis not present

## 2017-11-26 DIAGNOSIS — M545 Low back pain: Secondary | ICD-10-CM | POA: Diagnosis not present

## 2017-11-26 DIAGNOSIS — M9905 Segmental and somatic dysfunction of pelvic region: Secondary | ICD-10-CM | POA: Diagnosis not present

## 2017-11-26 DIAGNOSIS — M9903 Segmental and somatic dysfunction of lumbar region: Secondary | ICD-10-CM | POA: Diagnosis not present

## 2018-01-02 ENCOUNTER — Other Ambulatory Visit: Payer: Self-pay

## 2018-01-02 ENCOUNTER — Telehealth: Payer: Self-pay | Admitting: Obstetrics and Gynecology

## 2018-01-02 ENCOUNTER — Ambulatory Visit: Payer: Medicare Other | Admitting: Family Medicine

## 2018-01-02 ENCOUNTER — Ambulatory Visit (INDEPENDENT_AMBULATORY_CARE_PROVIDER_SITE_OTHER)
Admission: RE | Admit: 2018-01-02 | Discharge: 2018-01-02 | Disposition: A | Discharge status: Home / Self Care | Payer: Medicare Other | Source: Ambulatory Visit | Attending: Family Medicine | Admitting: Family Medicine

## 2018-01-02 ENCOUNTER — Encounter: Payer: Self-pay | Admitting: Family Medicine

## 2018-01-02 VITALS — BP 150/84 | HR 68 | Temp 98.9°F | Ht 60.0 in | Wt 149.0 lb

## 2018-01-02 DIAGNOSIS — M25551 Pain in right hip: Secondary | ICD-10-CM

## 2018-01-02 DIAGNOSIS — M7061 Trochanteric bursitis, right hip: Secondary | ICD-10-CM

## 2018-01-02 DIAGNOSIS — M1611 Unilateral primary osteoarthritis, right hip: Secondary | ICD-10-CM | POA: Diagnosis not present

## 2018-01-02 MED ORDER — DICLOFENAC SODIUM 75 MG PO TBEC: 75.0000 mg | DELAYED_RELEASE_TABLET | Freq: Two times a day (BID) | ORAL | 3 refills | Status: DC

## 2018-01-02 NOTE — Telephone Encounter (Signed)
Left message on voicemail to call and reschedule cancelled appointment. °

## 2018-01-02 NOTE — Progress Notes (Signed)
Dr. Frederico Hamman T. Honestee Revard, MD, [facility] [place], [address] Phone: [phone] Fax: [phone]  01/02/2018  Patient: Gloria Werner, MRN: [id number], DOB: 11-02-1941, 77 y.o.  Primary Physician:  Jearld Fenton, NP   Chief Complaint  Patient presents with  . Leg Pain    Right x 6 weeks   Subjective:   Gloria Werner is a 77 y.o. very pleasant female patient who presents with the following:  R hip and groin area x 6 weeks. New.  She describes pain in her groin.  She has pain with rotational movements including internal and external rotation, and this is only on the right and not on the left.  She has not tried any over-the-counter remedies including no NSAIDs.  She did try a little bit of some Biofreeze.  She has not had any trauma or accident that she can recall.  She did do a little bit of shoveling at the last snowstorm approximately 1 month ago.  She is not having any problems at all on the left.  Past Medical History, Surgical History, Social History, Family History, Problem List, Medications, and Allergies have been reviewed and updated if relevant.  Patient Active Problem List   Diagnosis Date Noted  . Seasonal affective disorder ([place]) 01/17/2017  . Lichen simplex chronicus 04/08/2015  . Osteopenia 04/08/2015  . Depression 03/05/2015  . ALLERGIC RHINITIS, SEASONAL 02/03/2008  . IRRITABLE BOWEL SYNDROME 02/03/2008    Past Medical History:  Diagnosis Date  . Allergic rhinitis due to pollen   . Irritable bowel syndrome   . 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 ABDOMINAL HYSTERECTOMY  1988   secondary to prolapse  . BLADDER SUSPENSION     A-P with Hyst  . NASAL SINUS SURGERY  1970  . TONSILLECTOMY      Social History   Socioeconomic History  . Marital status: Married    Spouse name: Not on file  . Number of children: 2  . Years of  education: 72  . Highest education level: Not on file  Social Needs  . Financial resource strain: Not on file  . Food insecurity - worry: Not on file  . Food insecurity - inability: Not on file  . Transportation needs - medical: Not on file  . Transportation needs - non-medical: Not on file  Occupational History  . Occupation: Environmental health practitioner  Tobacco Use  . Smoking status: Never Smoker  . Smokeless tobacco: Never Used  Substance and Sexual Activity  . Alcohol use: No  . Drug use: No  . Sexual activity: Yes    Partners: Male    Birth control/protection: Surgical  Other Topics Concern  . Not on file  Social History Narrative   HSG. Married '63. 1 son- 65'; 1-daughter-'68; grandchildren 3. 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 Marriage in good health. 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 Provided lead to [email or web address].    Family History  Problem Relation Age of Onset  . Emphysema Mother   . Lymphoma Mother   . Asthma Mother   . Cancer Father 23       lung cancer  . Coronary artery disease Brother   . Cancer Brother        breast cancer  . Cancer Brother        lung  cancer  . Diabetes Neg Hx   . Hypertension Neg Hx   . Colon cancer Neg Hx     Allergies  Allergen Reactions  . Codeine Nausea And Vomiting  . Lexapro [Escitalopram] Diarrhea    headache    Medication list reviewed and updated in full in [place].  GEN: No fevers, chills. Nontoxic. Primarily MSK c/o today. MSK: Detailed in the HPI GI: tolerating PO intake without difficulty Neuro: No numbness, parasthesias, or tingling associated. Otherwise the pertinent positives of the ROS are noted above.   Objective:   BP (!) 150/84   Pulse 68   Temp 98.9 F (37.2 C) (Oral)   Ht 5' (1.524 m)   Wt 149 lb (67.6 kg)   LMP 12/25/1986 (Approximate)   BMI 29.10 kg/m    GEN: WDWN, NAD, Non-toxic, Alert & Oriented x  3 HEENT: Atraumatic, Normocephalic.  Ears and Nose: No external deformity. EXTR: No clubbing/cyanosis/edema NEURO: Normal gait.  PSYCH: Normally interactive. Conversant. Not depressed or anxious appearing.  Calm demeanor.   HIP EXAM: SIDE: R ROM: Abduction, Flexion, Internal and External range of motion: Abduction to approximately 22 degrees.  Total rotational movements with the hip flexed to 90 degrees include approximately 17 degrees of total rotational movement. Pain with terminal IROM and EROM: Notable pain with terminal internal range of motion. GTB: NT SLR: NEG Knees: No effusion FABER: NT REVERSE FABER: NT, neg Piriformis: NT at direct palpation Str: flexion: 5/5 abduction: 5/5 adduction: 5/5 Strength testing non-tender  Radiology: Dg Hip Unilat With Pelvis 2-3 Views Right  Result Date: 01/02/2018 CLINICAL DATA:  Right hip pain. EXAM: DG HIP (WITH OR WITHOUT PELVIS) 2-3V RIGHT COMPARISON:  None. FINDINGS: There is slight medial joint space narrowing. No appreciable osteophytes or subcortical cysts or erosions. Similar medial joint space narrowing on the left. Pelvic bones appear normal. IMPRESSION: Minimal arthritic changes of the right hip. Electronically Signed   By: Lorriane Shire M.D.   On: 01/02/2018 10:51     Assessment and Plan:   Primary osteoarthritis of right hip  Right hip pain - Plan: DG HIP UNILAT WITH PELVIS 2-3 VIEWS RIGHT  Trochanteric bursitis, right hip  Appears to be mostly intra-articular on exam.  X-rays are better than expected, but still with some mild osteoarthritis.  She also does have some relatively mild trochanteric bursitis on the right.  At this point the patient has not done much at all to try to alleviate her symptoms, so I am going to have her start with some oral Voltaren over the next 2-3 weeks and begin some basic stretching.  If this does not help, then I think a burst of some oral steroids would be reasonable.  Follow-up: No Follow-up  on file.  Meds ordered this encounter  Medications  . diclofenac (VOLTAREN) 75 MG EC tablet    Sig: Take 1 tablet (75 mg total) by mouth 2 (two) times daily.    Dispense:  60 tablet    Refill:  3   Orders Placed This Encounter  Procedures  . DG HIP UNILAT WITH PELVIS 2-3 VIEWS RIGHT    Signed,  Mirka Barbone T. Mckinna Demars, MD   Allergies as of 01/02/2018      Reactions   Codeine Nausea And Vomiting   Lexapro [escitalopram] Diarrhea   headache      Medication List        Accurate as of 01/02/18 11:59 PM. Always use your most recent med list.  B-6 PO Take 1 tablet by mouth daily.   buPROPion 75 MG tablet Commonly known as:  WELLBUTRIN Take 1 tablet (75 mg total) by mouth 2 (two) times daily.   calcium carbonate 600 MG Tabs tablet Commonly known as:  OS-CAL Take 600 mg by mouth 2 (two) times daily with a meal.   conjugated estrogens vaginal cream Commonly known as:  PREMARIN Use 1/2 g vaginally at night 3 x week for use with pessary   diclofenac 75 MG EC tablet Commonly known as:  VOLTAREN Take 1 tablet (75 mg total) by mouth 2 (two) times daily.   multivitamin tablet Take 1 tablet by mouth daily.   saccharomyces boulardii 250 MG capsule Commonly known as:  FLORASTOR Take 2 capsules (500 mg total) by mouth 2 (two) times daily.   triamcinolone 55 MCG/ACT Aero nasal inhaler Commonly known as:  NASACORT Place 2 sprays into the nose daily.   triamcinolone ointment 0.5 % Commonly known as:  KENALOG Apply BID for up to 7 days with flare.   vitamin C 500 MG tablet Commonly known as:  ASCORBIC ACID Take 500 mg by mouth daily.   Vitamin D (Ergocalciferol) 50000 units Caps capsule Commonly known as:  DRISDOL Take 1 capsule (50,000 Units total) by mouth every 30 (thirty) days.

## 2018-01-02 NOTE — Patient Instructions (Signed)
Try taking the diclofenac twice a day for the next 2 weeks, and if you are not doing a lot better in 2 weeks, please call me.

## 2018-01-07 DIAGNOSIS — M9905 Segmental and somatic dysfunction of pelvic region: Secondary | ICD-10-CM | POA: Diagnosis not present

## 2018-01-07 DIAGNOSIS — M25551 Pain in right hip: Secondary | ICD-10-CM | POA: Diagnosis not present

## 2018-01-07 DIAGNOSIS — M9903 Segmental and somatic dysfunction of lumbar region: Secondary | ICD-10-CM | POA: Diagnosis not present

## 2018-01-07 DIAGNOSIS — M9904 Segmental and somatic dysfunction of sacral region: Secondary | ICD-10-CM | POA: Diagnosis not present

## 2018-01-08 DIAGNOSIS — D225 Melanocytic nevi of trunk: Secondary | ICD-10-CM | POA: Diagnosis not present

## 2018-01-08 DIAGNOSIS — X32XXXD Exposure to sunlight, subsequent encounter: Secondary | ICD-10-CM | POA: Diagnosis not present

## 2018-01-08 DIAGNOSIS — L570 Actinic keratosis: Secondary | ICD-10-CM | POA: Diagnosis not present

## 2018-01-08 DIAGNOSIS — Z08 Encounter for follow-up examination after completed treatment for malignant neoplasm: Secondary | ICD-10-CM | POA: Diagnosis not present

## 2018-01-08 DIAGNOSIS — Z85828 Personal history of other malignant neoplasm of skin: Secondary | ICD-10-CM | POA: Diagnosis not present

## 2018-01-11 DIAGNOSIS — M9903 Segmental and somatic dysfunction of lumbar region: Secondary | ICD-10-CM | POA: Diagnosis not present

## 2018-01-11 DIAGNOSIS — M9904 Segmental and somatic dysfunction of sacral region: Secondary | ICD-10-CM | POA: Diagnosis not present

## 2018-01-11 DIAGNOSIS — M9905 Segmental and somatic dysfunction of pelvic region: Secondary | ICD-10-CM | POA: Diagnosis not present

## 2018-01-11 DIAGNOSIS — M25551 Pain in right hip: Secondary | ICD-10-CM | POA: Diagnosis not present

## 2018-01-14 DIAGNOSIS — M9905 Segmental and somatic dysfunction of pelvic region: Secondary | ICD-10-CM | POA: Diagnosis not present

## 2018-01-14 DIAGNOSIS — M25551 Pain in right hip: Secondary | ICD-10-CM | POA: Diagnosis not present

## 2018-01-14 DIAGNOSIS — M9903 Segmental and somatic dysfunction of lumbar region: Secondary | ICD-10-CM | POA: Diagnosis not present

## 2018-01-14 DIAGNOSIS — M9904 Segmental and somatic dysfunction of sacral region: Secondary | ICD-10-CM | POA: Diagnosis not present

## 2018-01-21 ENCOUNTER — Ambulatory Visit: Payer: Medicare Other | Admitting: Nurse Practitioner

## 2018-01-21 ENCOUNTER — Ambulatory Visit: Payer: Medicare Other | Admitting: Obstetrics and Gynecology

## 2018-02-11 DIAGNOSIS — M9905 Segmental and somatic dysfunction of pelvic region: Secondary | ICD-10-CM | POA: Diagnosis not present

## 2018-02-11 DIAGNOSIS — M9903 Segmental and somatic dysfunction of lumbar region: Secondary | ICD-10-CM | POA: Diagnosis not present

## 2018-02-11 DIAGNOSIS — M25551 Pain in right hip: Secondary | ICD-10-CM | POA: Diagnosis not present

## 2018-02-11 DIAGNOSIS — M9904 Segmental and somatic dysfunction of sacral region: Secondary | ICD-10-CM | POA: Diagnosis not present

## 2018-02-13 ENCOUNTER — Ambulatory Visit (INDEPENDENT_AMBULATORY_CARE_PROVIDER_SITE_OTHER): Payer: Medicare Other | Admitting: Obstetrics and Gynecology

## 2018-02-13 ENCOUNTER — Other Ambulatory Visit: Payer: Self-pay

## 2018-02-13 ENCOUNTER — Encounter: Payer: Self-pay | Admitting: Obstetrics and Gynecology

## 2018-02-13 VITALS — BP 150/80 | HR 80 | Resp 14 | Ht 60.0 in | Wt 146.0 lb

## 2018-02-13 DIAGNOSIS — M8580 Other specified disorders of bone density and structure, unspecified site: Secondary | ICD-10-CM | POA: Diagnosis not present

## 2018-02-13 DIAGNOSIS — Z4689 Encounter for fitting and adjustment of other specified devices: Secondary | ICD-10-CM

## 2018-02-13 DIAGNOSIS — N899 Noninflammatory disorder of vagina, unspecified: Secondary | ICD-10-CM | POA: Diagnosis not present

## 2018-02-13 DIAGNOSIS — Z01419 Encounter for gynecological examination (general) (routine) without abnormal findings: Secondary | ICD-10-CM

## 2018-02-13 DIAGNOSIS — N898 Other specified noninflammatory disorders of vagina: Secondary | ICD-10-CM

## 2018-02-13 DIAGNOSIS — E559 Vitamin D deficiency, unspecified: Secondary | ICD-10-CM

## 2018-02-13 MED ORDER — BUPROPION HCL 75 MG PO TABS: 75.0000 mg | ORAL_TABLET | Freq: Two times a day (BID) | ORAL | 3 refills | Status: DC

## 2018-02-13 NOTE — Patient Instructions (Addendum)
Use 0.5 grams of premarin daily for one week, then every other night for one week  EXERCISE AND DIET:  We recommended that you start or continue a regular exercise program for good health. Regular exercise means any activity that makes your heart beat faster and makes you sweat.  We recommend exercising at least 30 minutes per day at least 3 days a week, preferably 4 or 5.  We also recommend a diet low in fat and sugar.  Inactivity, poor dietary choices and obesity can cause diabetes, heart attack, stroke, and kidney damage, among others.    ALCOHOL AND SMOKING:  Women should limit their alcohol intake to no more than 7 drinks/beers/glasses of wine (combined, not each!) per week. Moderation of alcohol intake to this level decreases your risk of breast cancer and liver damage. And of course, no recreational drugs are part of a healthy lifestyle.  And absolutely no smoking or even second hand smoke. Most people know smoking can cause heart and lung diseases, but did you know it also contributes to weakening of your bones? Aging of your skin?  Yellowing of your teeth and nails?  CALCIUM AND VITAMIN D:  Adequate intake of calcium and Vitamin D are recommended.  The recommendations for exact amounts of these supplements seem to change often, but generally speaking 600 mg of calcium (either carbonate or citrate) and 800 units of Vitamin D per day seems prudent. Certain women may benefit from higher intake of Vitamin D.  If you are among these women, your doctor will have told you during your visit.    PAP SMEARS:  Pap smears, to check for cervical cancer or precancers,  have traditionally been done yearly, although recent scientific advances have shown that most women can have pap smears less often.  However, every woman still should have a physical exam from her gynecologist every year. It will include a breast check, inspection of the vulva and vagina to check for abnormal growths or skin changes, a visual exam  of the cervix, and then an exam to evaluate the size and shape of the uterus and ovaries.  And after 75 years of age, a rectal exam is indicated to check for rectal cancers. We will also provide age appropriate advice regarding health maintenance, like when you should have certain vaccines, screening for sexually transmitted diseases, bone density testing, colonoscopy, mammograms, etc.   MAMMOGRAMS:  All women over 57 years old should have a yearly mammogram. Many facilities now offer a "3D" mammogram, which may cost around $50 extra out of pocket. If possible,  we recommend you accept the option to have the 3D mammogram performed.  It both reduces the number of women who will be called back for extra views which then turn out to be normal, and it is better than the routine mammogram at detecting truly abnormal areas.    COLONOSCOPY:  Colonoscopy to screen for colon cancer is recommended for all women at age 55.  We know, you hate the idea of the prep.  We agree, BUT, having colon cancer and not knowing it is worse!!  Colon cancer so often starts as a polyp that can be seen and removed at colonscopy, which can quite literally save your life!  And if your first colonoscopy is normal and you have no family history of colon cancer, most women don't have to have it again for 10 years.  Once every ten years, you can do something that may end up saving your  life, right?  We will be happy to help you get it scheduled when you are ready.  Be sure to check your insurance coverage so you understand how much it will cost.  It may be covered as a preventative service at no cost, but you should check your particular policy.

## 2018-02-13 NOTE — Progress Notes (Signed)
77 y.o. [id number] MarriedCaucasianF here for annual exam.  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  She uses a pessary, she can have bleeding when she strains. Hasn't happened in a year, but happened yesterday. She only takes the pessary out if she has an increase vaginal d/c, can be as infrequent as every 3 months. Sometimes she leaves it out for a week or two and buts it back in when the prolapse returns.  She hasn't used her estrogen cream for 6-8 months.  Occasionally sexually active, takes the pessary out to have sex. No dyspareunia.   Sometimes when she bends over she will fell something "catching" in her mid abdomen. She rubs it and it will resolve in a minute or so.     Patient's last menstrual period was 12/25/1986 (approximate).          Sexually active: Yes.    The current method of family planning is status post hysterectomy.    Exercising: Yes.    walking  Smoker:  no  Health Maintenance: Pap:  unsure History of abnormal Pap:  no MMG:  04-12-17 WNL  Colonoscopy:  09-01-13 polyps  BMD:   02-16-16 Osteopenia  TDaP:  10-22-12 Gardasil: N/A   reports that  has never smoked. she has never used smokeless tobacco. She reports that she does not drink alcohol or use drugs.  She works for [employer]. She has 2 children, 3 grandchildren and 1 great grandson (39 year old). She takes care of her grandson part time.   Past Medical History:  Diagnosis Date  . Allergic rhinitis due to pollen   . Irritable bowel syndrome   . 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 ABDOMINAL HYSTERECTOMY  1988   secondary to prolapse  . BLADDER SUSPENSION     A-P with Hyst  . NASAL SINUS SURGERY  1970  . TONSILLECTOMY      Current Outpatient Medications  Medication Sig Dispense Refill  . buPROPion (WELLBUTRIN) 75 MG tablet Take 1 tablet (75 mg total) by mouth 2 (two) times daily. 180 tablet 3  . calcium carbonate (OS-CAL)  600 MG TABS Take 600 mg by mouth 2 (two) times daily with a meal.      . conjugated estrogens (PREMARIN) vaginal cream Use 1/2 g vaginally at night 3 x week for use with pessary 60 g 3  . Multiple Vitamin (MULTIVITAMIN) tablet Take 1 tablet by mouth daily.      . Pyridoxine HCl (B-6 PO) Take 1 tablet by mouth daily.    Marland Kitchen saccharomyces boulardii (FLORASTOR) 250 MG capsule Take 2 capsules (500 mg total) by mouth 2 (two) times daily. 120 capsule 3  . triamcinolone (NASACORT) 55 MCG/ACT AERO nasal inhaler Place 2 sprays into the nose daily.    Marland Kitchen triamcinolone ointment (KENALOG) 0.5 % Apply BID for up to 7 days with flare. 15 g 12  . vitamin C (ASCORBIC ACID) 500 MG tablet Take 500 mg by mouth daily.    . Vitamin D, Ergocalciferol, (DRISDOL) 50000 units CAPS capsule Take 1 capsule (50,000 Units total) by mouth every 30 (thirty) days. 30 capsule 3   No current facility-administered medications for this visit.     Family History  Problem Relation Age of Onset  . Emphysema Mother   . Lymphoma Mother   . Asthma Mother   . Cancer Father 58       lung cancer  .  Coronary artery disease Brother   . Cancer Brother        breast cancer  . Cancer Brother        lung cancer  . Diabetes Neg Hx   . Hypertension Neg Hx   . Colon cancer Neg Hx     Review of Systems  Constitutional: Negative.   HENT: Negative.   Eyes: Negative.   Respiratory: Negative.   Cardiovascular: Negative.   Gastrointestinal: Negative.   Endocrine: Negative.   Genitourinary: Negative.   Musculoskeletal: Negative.   Skin: Negative.   Allergic/Immunologic: Negative.   Neurological: Negative.   Psychiatric/Behavioral: Negative.     Exam:   BP (!) 150/80 (BP Location: Right Arm, Patient Position: Sitting, Cuff Size: Normal)   Pulse 80   Resp 14   Ht 5' (1.524 m)   Wt 146 lb (66.2 kg)   LMP 12/25/1986 (Approximate)   BMI 28.51 kg/m   Weight change: @WEIGHTCHANGE @ Height:   Height: 5' (152.4 cm)  Ht Readings from  Last 3 Encounters:  02/13/18 5' (1.524 m)  01/02/18 5' (1.524 m)  08/14/17 5' (1.524 m)    General appearance: alert, cooperative and appears stated age Head: Normocephalic, without obvious abnormality, atraumatic Neck: no adenopathy, supple, symmetrical, trachea midline and thyroid normal to inspection and palpation Lungs: clear to auscultation bilaterally Cardiovascular: regular rate and rhythm Breasts: normal appearance, no masses or tenderness Abdomen: soft, non-tender; non distended,  no masses,  no organomegaly Extremities: extremities normal, atraumatic, no cyanosis or edema Skin: Skin color, texture, turgor normal. No rashes or lesions Lymph nodes: Cervical, supraclavicular, and axillary nodes normal. No abnormal inguinal nodes palpated Neurologic: Grossly normal   Pelvic: External genitalia:  no lesions              Urethra:  normal appearing urethra with no masses, tenderness or lesions              Bartholins and Skenes: normal                 Vagina: pessary is already out. She has granulation tissue at the apex of the vagina, ulcerations on the posterior vagina. She has an apparent rectocele, vault not currently prolapsing. Granulation tissue treated with silver nitrate              Cervix: absent               Bimanual Exam:  Uterus:  uterus absent              Adnexa: no mass, fullness, tenderness               Rectovaginal: Confirms               Anus:  normal sphincter tone, no lesions  Chaperone was present for exam.  A:  Well Woman with normal exam  Osteopenia  Vaginal vault prolapse, using a pessary, not using her vaginal estrogen  Vaginal irritation from the pessary  P:   No pap needed  Mammogram in 4/19  Colonoscopy in 9/19  DEXA this month (ordered)  Discussed breast self exam  Discussed calcium and vit D intake  She has premarin cream at home  Leave the pessary out, use 0.5 grams of premarin cream nightly for one week, then every other day for one  week.  F/U in 2 weeks, bring her pessary with her  Discussed that she should be seen at least every 3 months when using a pessary

## 2018-02-15 ENCOUNTER — Other Ambulatory Visit: Payer: Self-pay | Admitting: *Deleted

## 2018-02-15 NOTE — Telephone Encounter (Signed)
Medication refill request: Vitamin d  Last AEX:  02/13/18 JJ  Next AEX: 03/03/19  Last MMG (if hormonal medication request): 04/11/17 BIRADS 1 negative  Refill authorized: today, please advise

## 2018-02-15 NOTE — Telephone Encounter (Signed)
That is an unusual dosing of vit d. Her primary is checking her levels. I would recommend that she manage her vit d.

## 2018-02-18 NOTE — Telephone Encounter (Signed)
Spoke with patient, advised as seen below per Dr. Talbert Nan. Patient states the request was supposed to go to PCP R. Baity, NP. Patient states she will f/u with PCP and pharmacy. Patient verbalizes understanding and is agreeable.   Routing to provider for final review. Patient is agreeable to disposition. Will close encounter.

## 2018-02-21 ENCOUNTER — Other Ambulatory Visit: Payer: Self-pay | Admitting: Obstetrics and Gynecology

## 2018-02-21 DIAGNOSIS — Z1231 Encounter for screening mammogram for malignant neoplasm of breast: Secondary | ICD-10-CM

## 2018-02-21 DIAGNOSIS — M8580 Other specified disorders of bone density and structure, unspecified site: Secondary | ICD-10-CM

## 2018-02-27 ENCOUNTER — Encounter: Payer: Self-pay | Admitting: Obstetrics and Gynecology

## 2018-02-27 ENCOUNTER — Other Ambulatory Visit: Payer: Self-pay

## 2018-02-27 ENCOUNTER — Ambulatory Visit: Payer: Medicare Other | Admitting: Obstetrics and Gynecology

## 2018-02-27 VITALS — BP 142/80 | HR 72 | Resp 14 | Wt 146.0 lb

## 2018-02-27 DIAGNOSIS — N819 Female genital prolapse, unspecified: Secondary | ICD-10-CM

## 2018-02-27 DIAGNOSIS — N899 Noninflammatory disorder of vagina, unspecified: Secondary | ICD-10-CM | POA: Diagnosis not present

## 2018-02-27 DIAGNOSIS — N952 Postmenopausal atrophic vaginitis: Secondary | ICD-10-CM

## 2018-02-27 DIAGNOSIS — N898 Other specified noninflammatory disorders of vagina: Secondary | ICD-10-CM

## 2018-02-27 MED ORDER — ESTROGENS, CONJUGATED 0.625 MG/GM VA CREA: TOPICAL_CREAM | VAGINAL | 1 refills | Status: DC

## 2018-02-27 NOTE — Progress Notes (Signed)
GYNECOLOGY  VISIT   HPI: 77 y.o.   Married  Caucasian  female   G2P2002 with Patient's last menstrual period was 12/25/1986 (approximate).  here for follow up on vaginal irritation and pessary check. She was seen 2 weeks ago for her annual and was noted to have significant vaginal irritation from her pessary. She was infrequently taking the pessary out and wasn't using her vaginal estrogen. She is doing okay without the pessary, the prolapse hasn't been bothering her. Typically it takes 2-3 weeks for the prolapse to really bother her. She has been using the cream as directed.  She is infrequently sexually active, husband has medical issues which make it difficult for him.  GYNECOLOGIC HISTORY: Patient's last menstrual period was 12/25/1986 (approximate). Contraception:postmenopause  Menopausal hormone therapy: none         OB History    Gravida Para Term Preterm AB Living   2 2 2  0 0 2   SAB TAB Ectopic Multiple Live Births   0 0 0 0 2         Patient Active Problem List   Diagnosis Date Noted  . Seasonal affective disorder ([place]) 01/17/2017  . Lichen simplex chronicus 04/08/2015  . Osteopenia 04/08/2015  . Depression 03/05/2015  . ALLERGIC RHINITIS, SEASONAL 02/03/2008  . IRRITABLE BOWEL SYNDROME 02/03/2008    Past Medical History:  Diagnosis Date  . Allergic rhinitis due to pollen   . Irritable bowel syndrome   . 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 ABDOMINAL HYSTERECTOMY  1988   secondary to prolapse  . BLADDER SUSPENSION     A-P with Hyst  . NASAL SINUS SURGERY  1970  . TONSILLECTOMY      Current Outpatient Medications  Medication Sig Dispense Refill  . buPROPion (WELLBUTRIN) 75 MG tablet Take 1 tablet (75 mg total) by mouth 2 (two) times daily. 180 tablet 3  . calcium carbonate (OS-CAL) 600 MG TABS Take 600 mg by mouth 2 (two) times daily with a meal.      . conjugated estrogens (PREMARIN) vaginal cream Use 1/2 g  vaginally at night 3 x week for use with pessary 60 g 3  . Multiple Vitamin (MULTIVITAMIN) tablet Take 1 tablet by mouth daily.      . Pyridoxine HCl (B-6 PO) Take 1 tablet by mouth daily.    Marland Kitchen saccharomyces boulardii (FLORASTOR) 250 MG capsule Take 2 capsules (500 mg total) by mouth 2 (two) times daily. 120 capsule 3  . triamcinolone (NASACORT) 55 MCG/ACT AERO nasal inhaler Place 2 sprays into the nose daily.    Marland Kitchen triamcinolone ointment (KENALOG) 0.5 % Apply BID for up to 7 days with flare. 15 g 12  . vitamin C (ASCORBIC ACID) 500 MG tablet Take 500 mg by mouth daily.    . Vitamin D, Ergocalciferol, (DRISDOL) 50000 units CAPS capsule Take 1 capsule (50,000 Units total) by mouth every 30 (thirty) days. 30 capsule 3   No current facility-administered medications for this visit.      ALLERGIES: Codeine and Lexapro [escitalopram]  Family History  Problem Relation Age of Onset  . Emphysema Mother   . Lymphoma Mother   . Asthma Mother   . Cancer Father 41       lung cancer  . Coronary artery disease Brother   . Cancer Brother        breast cancer  . Cancer Brother  lung cancer  . Diabetes Neg Hx   . Hypertension Neg Hx   . Colon cancer Neg Hx     Social History   Socioeconomic History  . Marital status: Married    Spouse name: Not on file  . Number of children: 2  . Years of education: 55  . Highest education level: Not on file  Social Needs  . Financial resource strain: Not on file  . Food insecurity - worry: Not on file  . Food insecurity - inability: Not on file  . Transportation needs - medical: Not on file  . Transportation needs - non-medical: Not on file  Occupational History  . Occupation: Environmental health practitioner  Tobacco Use  . Smoking status: Never Smoker  . Smokeless tobacco: Never Used  Substance and Sexual Activity  . Alcohol use: No  . Drug use: No  . Sexual activity: Yes    Partners: Male    Birth control/protection: Surgical  Other Topics Concern  .  Not on file  Social History Narrative   HSG. Married '63. 1 son- 23'; 1-daughter-'68; grandchildren 3. 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 Marriage in good health. 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 Provided lead to [email or web address].    Review of Systems  Constitutional: Negative.   HENT: Negative.   Eyes: Negative.   Respiratory: Negative.   Cardiovascular: Negative.   Gastrointestinal: Negative.   Genitourinary: Negative.   Musculoskeletal: Negative.   Skin: Negative.   Neurological: Negative.   Endo/Heme/Allergies: Negative.   Psychiatric/Behavioral: Negative.     PHYSICAL EXAMINATION:    BP (!) 142/80 (BP Location: Right Arm, Patient Position: Sitting, Cuff Size: Normal)   Pulse 72   Resp 14   Wt 146 lb (66.2 kg)   LMP 12/25/1986 (Approximate)   BMI 28.51 kg/m     General appearance: alert, cooperative and appears stated age  Pelvic: External genitalia:  no lesions              Urethra:  normal appearing urethra with no masses, tenderness or lesions              Bartholins and Skenes: normal                 Vagina: with valsalva she has a grade 3 vault prolapse. She still has significant vaginal irritation. The ulcerations are healing slowly, the ulcerations cover about 40% of her vagina. No more granulation tissue.               Cervix: absent              Bimanual Exam:  Uterus:  uterus absent              Adnexa: no mass, fullness, tenderness              Chaperone was present for exam.  ASSESSMENT Vaginal vault prolapse Significant vaginal irritation from the pessary. The granulation tissue has improved, but the ulcerations are still healing.  Vaginal atrophy    PLAN Leave the pessary out Continue to use the vaginal premarin cream every other day for the next 2 weeks F/U in 2 weeks, if healed will likely replace the pessary Discussed need for close f/u Also discussed the  options for surgery. She could have a robotic sacrocolpopexy or a colpocleisis. She understands that a colpocleisis would mean she would never be able to have intercourse again. She wants  to discuss the options with her husband.    An After Visit Summary was printed and given to the patient.  ~15 minutes face to face time of which over 50% was spent in counseling.

## 2018-03-13 ENCOUNTER — Encounter: Payer: Self-pay | Admitting: Obstetrics and Gynecology

## 2018-03-13 ENCOUNTER — Ambulatory Visit: Payer: Medicare Other | Admitting: Obstetrics and Gynecology

## 2018-03-13 ENCOUNTER — Other Ambulatory Visit: Payer: Self-pay

## 2018-03-13 VITALS — BP 138/80 | HR 88 | Resp 14 | Wt 145.0 lb

## 2018-03-13 DIAGNOSIS — N952 Postmenopausal atrophic vaginitis: Secondary | ICD-10-CM | POA: Diagnosis not present

## 2018-03-13 DIAGNOSIS — N898 Other specified noninflammatory disorders of vagina: Secondary | ICD-10-CM

## 2018-03-13 DIAGNOSIS — N819 Female genital prolapse, unspecified: Secondary | ICD-10-CM

## 2018-03-13 DIAGNOSIS — N899 Noninflammatory disorder of vagina, unspecified: Secondary | ICD-10-CM

## 2018-03-13 NOTE — Progress Notes (Signed)
GYNECOLOGY  VISIT   HPI: 77 y.o.   Married  Caucasian  female   G2P2002 with Patient's last menstrual period was 12/25/1986 (approximate).   here for follow up on vaginal irritation from pessary use. The patient had used her pessary for a year without f/u. At her annual exam a month ago she was noted to have significant vaginal irritation. The pessary has been left out, she has been using the premarin cream every other day for the last 2 weeks. The prolapse hasn't been bothering her much. Slight bulging. No bleeding until today, she noticed a small amount of blood on her panty liner today. She did mow the lawn this morning.  She has talked to her husband about colpocleisis and they are considering that option. She is aware of the option of sacrocolpopexy.  GYNECOLOGIC HISTORY: Patient's last menstrual period was 12/25/1986 (approximate). Contraception:postmenopause Menopausal hormone therapy: none         OB History    Gravida Para Term Preterm AB Living   2 2 2  0 0 2   SAB TAB Ectopic Multiple Live Births   0 0 0 0 2         Patient Active Problem List   Diagnosis Date Noted  . Seasonal affective disorder ([place]) 01/17/2017  . Lichen simplex chronicus 04/08/2015  . Osteopenia 04/08/2015  . Depression 03/05/2015  . ALLERGIC RHINITIS, SEASONAL 02/03/2008  . IRRITABLE BOWEL SYNDROME 02/03/2008    Past Medical History:  Diagnosis Date  . Allergic rhinitis due to pollen   . Irritable bowel syndrome   . 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 ABDOMINAL HYSTERECTOMY  1988   secondary to prolapse  . BLADDER SUSPENSION     A-P with Hyst  . NASAL SINUS SURGERY  1970  . TONSILLECTOMY      Current Outpatient Medications  Medication Sig Dispense Refill  . buPROPion (WELLBUTRIN) 75 MG tablet Take 1 tablet (75 mg total) by mouth 2 (two) times daily. 180 tablet 3  . calcium carbonate (OS-CAL) 600 MG TABS Take 600 mg by mouth 2 (two)  times daily with a meal.      . conjugated estrogens (PREMARIN) vaginal cream Use 1/2 g vaginally every other night for the next 2 weeks, then 2 x a week at hs 30 g 1  . Multiple Vitamin (MULTIVITAMIN) tablet Take 1 tablet by mouth daily.      . Pyridoxine HCl (B-6 PO) Take 1 tablet by mouth daily.    Marland Kitchen saccharomyces boulardii (FLORASTOR) 250 MG capsule Take 2 capsules (500 mg total) by mouth 2 (two) times daily. 120 capsule 3  . triamcinolone (NASACORT) 55 MCG/ACT AERO nasal inhaler Place 2 sprays into the nose daily.    Marland Kitchen triamcinolone ointment (KENALOG) 0.5 % Apply BID for up to 7 days with flare. 15 g 12  . vitamin C (ASCORBIC ACID) 500 MG tablet Take 500 mg by mouth daily.    . Vitamin D, Ergocalciferol, (DRISDOL) 50000 units CAPS capsule Take 1 capsule (50,000 Units total) by mouth every 30 (thirty) days. 30 capsule 3   No current facility-administered medications for this visit.      ALLERGIES: Codeine and Lexapro [escitalopram]  Family History  Problem Relation Age of Onset  . Emphysema Mother   . Lymphoma Mother   . Asthma Mother   . Cancer Father 56       lung cancer  . Coronary artery disease  Brother   . Cancer Brother        breast cancer  . Cancer Brother        lung cancer  . Diabetes Neg Hx   . Hypertension Neg Hx   . Colon cancer Neg Hx     Social History   Socioeconomic History  . Marital status: Married    Spouse name: Not on file  . Number of children: 2  . Years of education: 1  . Highest education level: Not on file  Social Needs  . Financial resource strain: Not on file  . Food insecurity - worry: Not on file  . Food insecurity - inability: Not on file  . Transportation needs - medical: Not on file  . Transportation needs - non-medical: Not on file  Occupational History  . Occupation: Environmental health practitioner  Tobacco Use  . Smoking status: Never Smoker  . Smokeless tobacco: Never Used  Substance and Sexual Activity  . Alcohol use: No  . Drug use:  No  . Sexual activity: Yes    Partners: Male    Birth control/protection: Surgical  Other Topics Concern  . Not on file  Social History Narrative   HSG. Married '63. 1 son- 71'; 1-daughter-'68; grandchildren 3. 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 Marriage in good health. 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 Provided lead to [email or web address].    Review of Systems  Constitutional: Negative.   HENT: Negative.   Eyes: Negative.   Respiratory: Negative.   Cardiovascular: Negative.   Gastrointestinal: Negative.   Genitourinary: Negative.   Musculoskeletal: Negative.   Skin: Negative.   Neurological: Negative.   Endo/Heme/Allergies: Negative.   Psychiatric/Behavioral: Negative.     PHYSICAL EXAMINATION:    BP 138/80 (BP Location: Right Arm, Patient Position: Sitting, Cuff Size: Normal)   Pulse 88   Resp 14   Wt 145 lb (65.8 kg)   LMP 12/25/1986 (Approximate)   BMI 28.32 kg/m     General appearance: alert, cooperative and appears stated age  Pelvic: External genitalia:  no lesions              Urethra:  normal appearing urethra with no masses, tenderness or lesions              Bartholins and Skenes: normal                 Vagina: the anterior wall of the vagina is still healing in, appears to be missing the top layer of mucosa on approximately 30% of her vagina (all anteriorly). There is a small area near the edge of this area on the right that is friable. Area treated with silver nitrate. The posterior portion of the vagina is well estrogenized.              Cervix: absent  Chaperone was present for exam.  ASSESSMENT Severe vaginally irritation from the pessary, slowly healing She is tolerating leaving the pessary out Vault prolapse Vaginal atrophy, improving with the premarin    PLAN Keep the pessary out Use the premarin cream 3 x a week (0.5 grams) F/U in 2 weeks Reviewed the  colpocleisis option    An After Visit Summary was printed and given to the patient.

## 2018-03-18 DIAGNOSIS — M9903 Segmental and somatic dysfunction of lumbar region: Secondary | ICD-10-CM | POA: Diagnosis not present

## 2018-03-18 DIAGNOSIS — M9901 Segmental and somatic dysfunction of cervical region: Secondary | ICD-10-CM | POA: Diagnosis not present

## 2018-03-18 DIAGNOSIS — M9904 Segmental and somatic dysfunction of sacral region: Secondary | ICD-10-CM | POA: Diagnosis not present

## 2018-03-18 DIAGNOSIS — M545 Low back pain: Secondary | ICD-10-CM | POA: Diagnosis not present

## 2018-03-27 ENCOUNTER — Encounter: Payer: Self-pay | Admitting: Obstetrics and Gynecology

## 2018-03-27 ENCOUNTER — Telehealth: Payer: Self-pay | Admitting: Obstetrics and Gynecology

## 2018-03-27 ENCOUNTER — Ambulatory Visit (INDEPENDENT_AMBULATORY_CARE_PROVIDER_SITE_OTHER): Payer: Medicare Other | Admitting: Obstetrics and Gynecology

## 2018-03-27 ENCOUNTER — Other Ambulatory Visit: Payer: Self-pay

## 2018-03-27 VITALS — BP 140/84 | HR 76 | Resp 16 | Wt 146.0 lb

## 2018-03-27 DIAGNOSIS — N899 Noninflammatory disorder of vagina, unspecified: Secondary | ICD-10-CM

## 2018-03-27 DIAGNOSIS — N898 Other specified noninflammatory disorders of vagina: Secondary | ICD-10-CM

## 2018-03-27 NOTE — Progress Notes (Signed)
GYNECOLOGY  VISIT   HPI: 77 y.o.   Married  Caucasian  female   G2P2002 with Patient's last menstrual period was 12/25/1986 (approximate).   here to follow up on vaginal irritation from pessary use. The patient had her pessary removed and left out on 02/13/18 secondary to severe vaginal irritation. Overall she is doing okay with the pessary out. Doesn't think the vagina is protruding as much as it used. Only bothers her in certain positions. Using the vaginal estrogen 3 x a week (premarin 0.5 grams)    GYNECOLOGIC HISTORY: Patient's last menstrual period was 12/25/1986 (approximate). Contraception:hysterectomy  Menopausal hormone therapy: none         OB History    Gravida  2   Para  2   Term  2   Preterm  0   AB  0   Living  2     SAB  0   TAB  0   Ectopic  0   Multiple  0   Live Births  2              Patient Active Problem List   Diagnosis Date Noted  . Seasonal affective disorder ([place]) 01/17/2017  . Lichen simplex chronicus 04/08/2015  . Osteopenia 04/08/2015  . Depression 03/05/2015  . ALLERGIC RHINITIS, SEASONAL 02/03/2008  . IRRITABLE BOWEL SYNDROME 02/03/2008    Past Medical History:  Diagnosis Date  . Allergic rhinitis due to pollen   . Irritable bowel syndrome   . 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 ABDOMINAL HYSTERECTOMY  1988   secondary to prolapse  . BLADDER SUSPENSION     A-P with Hyst  . NASAL SINUS SURGERY  1970  . TONSILLECTOMY      Current Outpatient Medications  Medication Sig Dispense Refill  . buPROPion (WELLBUTRIN) 75 MG tablet Take 1 tablet (75 mg total) by mouth 2 (two) times daily. 180 tablet 3  . calcium carbonate (OS-CAL) 600 MG TABS Take 600 mg by mouth 2 (two) times daily with a meal.      . conjugated estrogens (PREMARIN) vaginal cream Use 1/2 g vaginally every other night for the next 2 weeks, then 2 x a week at hs 30 g 1  . Pyridoxine HCl (B-6 PO) Take 1 tablet  by mouth daily.    Marland Kitchen triamcinolone (NASACORT) 55 MCG/ACT AERO nasal inhaler Place 2 sprays into the nose daily.    Marland Kitchen triamcinolone ointment (KENALOG) 0.5 % Apply BID for up to 7 days with flare. 15 g 12  . vitamin C (ASCORBIC ACID) 500 MG tablet Take 500 mg by mouth daily.    . Multiple Vitamin (MULTIVITAMIN) tablet Take 1 tablet by mouth daily.      Marland Kitchen saccharomyces boulardii (FLORASTOR) 250 MG capsule Take 2 capsules (500 mg total) by mouth 2 (two) times daily. 120 capsule 3  . Vitamin D, Ergocalciferol, (DRISDOL) 50000 units CAPS capsule Take 1 capsule (50,000 Units total) by mouth every 30 (thirty) days. (Patient not taking: Reported on 03/27/2018) 30 capsule 3   No current facility-administered medications for this visit.      ALLERGIES: Codeine and Lexapro [escitalopram]  Family History  Problem Relation Age of Onset  . Emphysema Mother   . Lymphoma Mother   . Asthma Mother   . Cancer Father 62       lung cancer  . Coronary artery disease Brother   . Cancer Brother  breast cancer  . Cancer Brother        lung cancer  . Diabetes Neg Hx   . Hypertension Neg Hx   . Colon cancer Neg Hx     Social History   Socioeconomic History  . Marital status: Married    Spouse name: Not on file  . Number of children: 2  . Years of education: 75  . Highest education level: Not on file  Occupational History  . Occupation: Environmental health practitioner  Social Needs  . Financial resource strain: Not on file  . Food insecurity:    Worry: Not on file    Inability: Not on file  . Transportation needs:    Medical: Not on file    Non-medical: Not on file  Tobacco Use  . Smoking status: Never Smoker  . Smokeless tobacco: Never Used  Substance and Sexual Activity  . Alcohol use: No  . Drug use: No  . Sexual activity: Yes    Partners: Male    Birth control/protection: Surgical  Lifestyle  . Physical activity:    Days per week: Not on file    Minutes per session: Not on file  . Stress:  Not on file  Relationships  . Social connections:    Talks on phone: Not on file    Gets together: Not on file    Attends religious service: Not on file    Active member of club or organization: Not on file    Attends meetings of clubs or organizations: Not on file    Relationship status: Not on file  . Intimate partner violence:    Fear of current or ex partner: Not on file    Emotionally abused: Not on file    Physically abused: Not on file    Forced sexual activity: Not on file  Other Topics Concern  . Not on file  Social History Narrative   HSG. Married '63. 1 son- 35'; 1-daughter-'68; grandchildren 3. 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 Marriage in good health. 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 Provided lead to [email or web address].    Review of Systems  Constitutional: Negative.   HENT: Negative.   Eyes: Negative.   Respiratory: Negative.   Cardiovascular: Negative.   Gastrointestinal: Negative.   Genitourinary: Negative.   Musculoskeletal: Negative.   Skin: Negative.   Neurological: Negative.   Endo/Heme/Allergies: Negative.   Psychiatric/Behavioral: Negative.     PHYSICAL EXAMINATION:    BP 140/84 (BP Location: Right Arm, Patient Position: Sitting, Cuff Size: Normal)   Pulse 76   Resp 16   Wt 146 lb (66.2 kg)   LMP 12/25/1986 (Approximate)   BMI 28.51 kg/m     General appearance: alert, cooperative and appears stated age  Pelvic: External genitalia:  no lesions              Urethra:  normal appearing urethra with no masses, tenderness or lesions              Bartholins and Skenes: normal                 Vagina: vaginal vault prolapse. The anterior vagina is still healing, appears to be missing the top layer of mucosa, not friable, just slowly healing. Still approximately 25-30% of the vagina is affected (all anteriorly)                Cervix:absent  Chaperone was  present for exam.  ASSESSMENT Vaginal irritation from pessary, slowly healing. Still not to the point of using the pessary    PLAN Continue with the 0.5 gm of premarin 3 x a week F/U in 1 month Call with any problems    An After Visit Summary was printed and given to the patient.

## 2018-03-27 NOTE — Telephone Encounter (Signed)
Patient states she was seen earlier by Dr.Jertson. She then ran a few errands and when she got back home her panties and mini pad were soaked. She wasn't quite sure this was urine but it soaked through to her pants. She states when here for her visit she was having slight LLQ discomfort which she forgot to tell the doctor, but pain now gone. Patient thought maybe a "cyst" had ruptured. Advised patient probably just urine but will discuss with Dr.Jertson and call back. She denies any fever and no blood in this liquid that soaked everything. Routed to [place]

## 2018-03-27 NOTE — Telephone Encounter (Signed)
It sounds like she leaked urine. There was no cyst to rupture.  Make sure she feels she is emptying her bladder well. She should let me know if the leakage is a recurring event.

## 2018-03-27 NOTE — Telephone Encounter (Signed)
Patient was seen this morning and has a question for Dr.Jertson.

## 2018-03-28 NOTE — Telephone Encounter (Signed)
Spoke with patient, advised as seen below per Dr. Talbert Nan. Patient states she does feel like her bladder empties well when voiding, had never experienced this before, has not happened again since. Patient aware to return call to office if continues, thankful for return call.   Routing to provider for final review. Patient is agreeable to disposition. Will close encounter.

## 2018-04-15 ENCOUNTER — Ambulatory Visit
Admission: RE | Admit: 2018-04-15 | Discharge: 2018-04-15 | Disposition: A | Discharge status: Home / Self Care | Payer: Medicare Other | Source: Ambulatory Visit | Attending: Obstetrics and Gynecology | Admitting: Obstetrics and Gynecology

## 2018-04-15 DIAGNOSIS — M8580 Other specified disorders of bone density and structure, unspecified site: Secondary | ICD-10-CM

## 2018-04-15 DIAGNOSIS — Z1231 Encounter for screening mammogram for malignant neoplasm of breast: Secondary | ICD-10-CM

## 2018-04-15 DIAGNOSIS — M8589 Other specified disorders of bone density and structure, multiple sites: Secondary | ICD-10-CM | POA: Diagnosis not present

## 2018-04-15 DIAGNOSIS — Z780 Asymptomatic menopausal state: Secondary | ICD-10-CM | POA: Diagnosis not present

## 2018-04-17 ENCOUNTER — Telehealth: Payer: Self-pay | Admitting: Internal Medicine

## 2018-04-17 NOTE — Telephone Encounter (Signed)
Copied from [place] [id number]. Topic: Quick Communication - Rx Refill/Question >> Apr 17, 2018  4:49 PM Percell Belt A wrote: Medication: Vitamin D, Ergocalciferol, (DRISDOL) 50000 units CAPS capsule [[id number]]  Has the patient contacted their pharmacy? No  (Agent: If no, request that the patient contact the pharmacy for the refill.) Preferred Pharmacy (with phone number or street name): [facility] in [place]  Agent: Please be advised that RX refills may take up to 3 business days. We ask that you follow-up with your pharmacy.

## 2018-04-19 NOTE — Telephone Encounter (Signed)
Routing to office  

## 2018-04-19 NOTE — Telephone Encounter (Signed)
Refill Ergocalciferol 50000 units 1 tab PO monthly # 1,3 refills

## 2018-04-19 NOTE — Telephone Encounter (Signed)
Called pt and based on original refill should have enough for at least 2.5 years. Pt stated that [facility] is filling 1 pill per month (pt does not have to pay for rx when it is dispensed this way) Asked pt to call back if no refill available per [facility]. [facility] wants her PCP to fill because her PCP is the provider that is checking her Vitamin D level.

## 2018-04-19 NOTE — Telephone Encounter (Signed)
Pt last annual 08/14/17 and last vit d level 08/13/17. Vit D 50000 units last filled # 30 x 3 on 01/17/17 by Kem Boroughs FNP.Please advise.

## 2018-04-22 ENCOUNTER — Telehealth: Payer: Self-pay | Admitting: Obstetrics and Gynecology

## 2018-04-22 ENCOUNTER — Encounter: Payer: Self-pay | Admitting: Obstetrics & Gynecology

## 2018-04-22 ENCOUNTER — Other Ambulatory Visit: Payer: Self-pay

## 2018-04-22 ENCOUNTER — Ambulatory Visit: Payer: Medicare Other | Admitting: Obstetrics & Gynecology

## 2018-04-22 VITALS — BP 152/86 | HR 76 | Resp 18 | Ht 60.0 in | Wt 147.0 lb

## 2018-04-22 DIAGNOSIS — N819 Female genital prolapse, unspecified: Secondary | ICD-10-CM | POA: Diagnosis not present

## 2018-04-22 DIAGNOSIS — T8389XA Other specified complication of genitourinary prosthetic devices, implants and grafts, initial encounter: Secondary | ICD-10-CM

## 2018-04-22 DIAGNOSIS — N899 Noninflammatory disorder of vagina, unspecified: Secondary | ICD-10-CM

## 2018-04-22 NOTE — Progress Notes (Deleted)
GYNECOLOGY  VISIT  CC:   ***  HPI: 77 y.o. G21P2002 [name] here for worsening vaginal prolapse.  GYNECOLOGIC HISTORY: Patient's last menstrual period was 12/25/1986 (approximate). Contraception:  Hysterectomy  Menopausal hormone therapy: none  Patient Active Problem List   Diagnosis Date Noted  . Seasonal affective disorder ([place]) 01/17/2017  . Lichen simplex chronicus 04/08/2015  . Osteopenia 04/08/2015  . Depression 03/05/2015  . ALLERGIC RHINITIS, SEASONAL 02/03/2008  . IRRITABLE BOWEL SYNDROME 02/03/2008    Past Medical History:  Diagnosis Date  . Allergic rhinitis due to pollen   . Irritable bowel syndrome   . 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 ABDOMINAL HYSTERECTOMY  1988   secondary to prolapse  . BLADDER SUSPENSION     A-P with Hyst  . NASAL SINUS SURGERY  1970  . TONSILLECTOMY      MEDS:   Current Outpatient Medications on File Prior to Visit  Medication Sig Dispense Refill  . buPROPion (WELLBUTRIN) 75 MG tablet Take 1 tablet (75 mg total) by mouth 2 (two) times daily. 180 tablet 3  . calcium carbonate (OS-CAL) 600 MG TABS Take 600 mg by mouth 2 (two) times daily with a meal.      . conjugated estrogens (PREMARIN) vaginal cream Use 1/2 g vaginally every other night for the next 2 weeks, then 2 x a week at hs 30 g 1  . Multiple Vitamin (MULTIVITAMIN) tablet Take 1 tablet by mouth daily.      . Pyridoxine HCl (B-6 PO) Take 1 tablet by mouth daily.    Marland Kitchen saccharomyces boulardii (FLORASTOR) 250 MG capsule Take 2 capsules (500 mg total) by mouth 2 (two) times daily. 120 capsule 3  . triamcinolone (NASACORT) 55 MCG/ACT AERO nasal inhaler Place 2 sprays into the nose daily.    Marland Kitchen triamcinolone ointment (KENALOG) 0.5 % Apply BID for up to 7 days with flare. 15 g 12  . vitamin C (ASCORBIC ACID) 500 MG tablet Take 500 mg by mouth daily.    . Vitamin D, Ergocalciferol, (DRISDOL) 50000 units CAPS capsule  Take 1 capsule (50,000 Units total) by mouth every 30 (thirty) days. 30 capsule 3   No current facility-administered medications on file prior to visit.     ALLERGIES: Codeine and Lexapro [escitalopram]  Family History  Problem Relation Age of Onset  . Emphysema Mother   . Lymphoma Mother   . Asthma Mother   . Cancer Father 56       lung cancer  . Coronary artery disease Brother   . Cancer Brother        breast cancer  . Cancer Brother        lung cancer  . Diabetes Neg Hx   . Hypertension Neg Hx   . Colon cancer Neg Hx     SH:  ***  Review of Systems  Genitourinary: Positive for frequency and urgency.       Loss of urine with sneeze or cough  Night urination   All other systems reviewed and are negative.   PHYSICAL EXAMINATION:    BP (!) 152/86 (BP Location: Left Arm, Patient Position: Sitting, Cuff Size: Large)   Pulse 76   Resp 18   Ht 5' (1.524 m)   Wt 147 lb (66.7 kg)   LMP 12/25/1986 (Approximate)   BMI 28.71 kg/m     General appearance: alert, cooperative and appears stated age Neck: no adenopathy, supple,  symmetrical, trachea midline and thyroid {CHL AMB PHY EX THYROID NORM DEFAULT:[phone]::"normal to inspection and palpation"} CV:  {Exam; heart brief:31539} Lungs:  {pe lungs ob:314451::"clear to auscultation, no wheezes, rales or rhonchi, symmetric air entry"} Breasts: {Exam; breast:13139::"normal appearance, no masses or tenderness"} Abdomen: soft, non-tender; bowel sounds normal; no masses,  no organomegaly  Pelvic: External genitalia:  no lesions              Urethra:  normal appearing urethra with no masses, tenderness or lesions              Bartholins and Skenes: normal                 Vagina: normal appearing vagina with normal color and discharge, no lesions              Cervix: {CHL AMB PHY EX CERVIX NORM [phone]::"no lesions"}              Bimanual Exam:  Uterus:  {CHL AMB PHY EX UTERUS NORM DEFAULT:[phone]::"normal size,  contour, position, consistency, mobility, non-tender"}              Adnexa: {CHL AMB PHY EX ADNEXA NO MASS DEFAULT:[phone]::"no mass, fullness, tenderness"}              Rectovaginal: {yes no:314532}.  Confirms.              Anus:  normal sphincter tone, no lesions  Chaperone was present for exam.  Assessment: ***  Plan: ***   ~{NUMBERS; -10-45 JOINT ROM:10287} minutes spent with patient >50% of time was in face to face discussion of above.

## 2018-04-22 NOTE — Progress Notes (Signed)
GYNECOLOGY  VISIT  CC:   Vaginal irritation/bleeding  HPI: 77 y.o. G42P2002 [name] here for recheck of vaginal irritation due to pessary use.  Using vaginal estrogen cream three times weekly.  Has been experiencing some pinkish vaginal discharge but yesterday she noticed some bright red bleeding.  Is not sure she is emptying her bladder completely at times.  Does feel she is completley emptying in the morning but through the day as she feels more prolapse, then feels like she may not be completley emptying.  Denies dysuria, change in urine color or odor.  Concerned that vaginal ulceration is not healing as quickly as she feels it should.  Review of chart notes a very large ulceration from the pessary so advised pt this would likely takes weeks to improve and from prior descriptions, it was improving.    GYNECOLOGIC HISTORY: Patient's last menstrual period was 12/25/1986 (approximate). Contraception: hysterectomy Menopausal hormone therapy: vaginal estrogen cream  Patient Active Problem List   Diagnosis Date Noted  . Seasonal affective disorder ([place]) 01/17/2017  . Lichen simplex chronicus 04/08/2015  . Osteopenia 04/08/2015  . Depression 03/05/2015  . ALLERGIC RHINITIS, SEASONAL 02/03/2008  . IRRITABLE BOWEL SYNDROME 02/03/2008    Past Medical History:  Diagnosis Date  . Allergic rhinitis due to pollen   . Irritable bowel syndrome   . 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 ABDOMINAL HYSTERECTOMY  1988   secondary to prolapse  . BLADDER SUSPENSION     A-P with Hyst  . NASAL SINUS SURGERY  1970  . TONSILLECTOMY      MEDS:   Current Outpatient Medications on File Prior to Visit  Medication Sig Dispense Refill  . buPROPion (WELLBUTRIN) 75 MG tablet Take 1 tablet (75 mg total) by mouth 2 (two) times daily. 180 tablet 3  . calcium carbonate (OS-CAL) 600 MG TABS Take 600 mg by mouth 2 (two) times daily with a  meal.      . conjugated estrogens (PREMARIN) vaginal cream Use 1/2 g vaginally every other night for the next 2 weeks, then 2 x a week at hs 30 g 1  . Multiple Vitamin (MULTIVITAMIN) tablet Take 1 tablet by mouth daily.      . Pyridoxine HCl (B-6 PO) Take 1 tablet by mouth daily.    Marland Kitchen saccharomyces boulardii (FLORASTOR) 250 MG capsule Take 2 capsules (500 mg total) by mouth 2 (two) times daily. 120 capsule 3  . triamcinolone (NASACORT) 55 MCG/ACT AERO nasal inhaler Place 2 sprays into the nose daily.    Marland Kitchen triamcinolone ointment (KENALOG) 0.5 % Apply BID for up to 7 days with flare. 15 g 12  . vitamin C (ASCORBIC ACID) 500 MG tablet Take 500 mg by mouth daily.    . Vitamin D, Ergocalciferol, (DRISDOL) 50000 units CAPS capsule Take 1 capsule (50,000 Units total) by mouth every 30 (thirty) days. (Patient not taking: Reported on 03/27/2018) 30 capsule 3   No current facility-administered medications on file prior to visit.     ALLERGIES: Codeine and Lexapro [escitalopram]  Family History  Problem Relation Age of Onset  . Emphysema Mother   . Lymphoma Mother   . Asthma Mother   . Cancer Father 42       lung cancer  . Coronary artery disease Brother   . Cancer Brother        breast cancer  . Cancer Brother  lung cancer  . Diabetes Neg Hx   . Hypertension Neg Hx   . Colon cancer Neg Hx     SH:  Married, non smoker  Review of Systems  Constitutional: Negative.   Gastrointestinal: Negative.   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  This is about 4cm in width.  A second smaller area of ulceration in the left vaginal apex measuring about 3cm noted.                Cervix: absent              Bimanual Exam:  Uterus:  uterus absent              Adnexa: no mass, fullness, tenderness  Chaperone was present for exam.  Assessment: Vaginal erosion from pessary use Vaginal vault prolapse  Plan: D/w pt that I am not sure she is putting the estrogen in the location of the ulceration.  She demonstrated has she applies the estrogen and she is putting it underneath the vault prolapse, not on top of it where the ulcerations are located.  Directions for use given with a mirror.  Would consider biopsy if not improved at that time.  Follow up 3 weeks with Dr. Talbert Nan.     ~20 minutes spent with patient >50% of time was in face to face discussion of above.

## 2018-04-22 NOTE — Telephone Encounter (Signed)
Patient is having prolapse. She had a place that she was waiting on to heal before reinserting pessary and it has gotten worse. Wants to be seen today.

## 2018-04-22 NOTE — Telephone Encounter (Signed)
Spoke with patient. Reports she wears a pessary for prolapse. Took pessary out and has not replaced it due to vaginal irritation. Feels irritation and prolapse are worsening. Had a BM this morning and had vaginal bleeding. Patient is concerned and would like to be seen today. States she has a tear in her vaginal area that is worsening. Using Premarin cream twice weekly. Appointment scheduled for today at 12:45 pm with Dr.Miller. Aware Dr.Jertson is out of the office this afternoon and agreeable to see another provided.  Routing to covering provider for final review. Patient agreeable to disposition. Will close encounter.

## 2018-04-23 MED ORDER — VITAMIN D (ERGOCALCIFEROL) 1.25 MG (50000 UNIT) PO CAPS: 50000.0000 [IU] | ORAL_CAPSULE | ORAL | 3 refills | Status: DC

## 2018-04-23 NOTE — Telephone Encounter (Signed)
Rx sent through [facility]  

## 2018-04-23 NOTE — Addendum Note (Signed)
Addended by: Lurlean Nanny on: 04/23/2018 04:31 PM   Modules accepted: Orders

## 2018-04-29 ENCOUNTER — Encounter: Payer: Self-pay | Admitting: Podiatry

## 2018-04-29 ENCOUNTER — Ambulatory Visit: Payer: Medicare Other | Admitting: Podiatry

## 2018-04-29 ENCOUNTER — Ambulatory Visit (INDEPENDENT_AMBULATORY_CARE_PROVIDER_SITE_OTHER): Payer: Medicare Other

## 2018-04-29 ENCOUNTER — Ambulatory Visit: Payer: Medicare Other | Admitting: Obstetrics and Gynecology

## 2018-04-29 ENCOUNTER — Other Ambulatory Visit: Payer: Self-pay | Admitting: Podiatry

## 2018-04-29 VITALS — BP 151/87 | HR 75

## 2018-04-29 DIAGNOSIS — M79672 Pain in left foot: Secondary | ICD-10-CM

## 2018-04-29 DIAGNOSIS — M9903 Segmental and somatic dysfunction of lumbar region: Secondary | ICD-10-CM | POA: Diagnosis not present

## 2018-04-29 DIAGNOSIS — M722 Plantar fascial fibromatosis: Secondary | ICD-10-CM

## 2018-04-29 DIAGNOSIS — M9904 Segmental and somatic dysfunction of sacral region: Secondary | ICD-10-CM | POA: Diagnosis not present

## 2018-04-29 DIAGNOSIS — G8929 Other chronic pain: Secondary | ICD-10-CM

## 2018-04-29 DIAGNOSIS — M9901 Segmental and somatic dysfunction of cervical region: Secondary | ICD-10-CM | POA: Diagnosis not present

## 2018-04-29 DIAGNOSIS — M545 Low back pain: Secondary | ICD-10-CM | POA: Diagnosis not present

## 2018-04-29 MED ORDER — TRIAMCINOLONE ACETONIDE 10 MG/ML IJ SUSP
10.0000 mg | Freq: Once | INTRAMUSCULAR | Status: AC
  Administered 2018-04-29: 10 mg

## 2018-04-29 MED ORDER — DICLOFENAC SODIUM 75 MG PO TBEC: 75.0000 mg | DELAYED_RELEASE_TABLET | Freq: Two times a day (BID) | ORAL | 2 refills | Status: DC

## 2018-04-29 NOTE — Patient Instructions (Signed)

## 2018-05-01 DIAGNOSIS — L570 Actinic keratosis: Secondary | ICD-10-CM | POA: Diagnosis not present

## 2018-05-01 DIAGNOSIS — L820 Inflamed seborrheic keratosis: Secondary | ICD-10-CM | POA: Diagnosis not present

## 2018-05-01 DIAGNOSIS — X32XXXD Exposure to sunlight, subsequent encounter: Secondary | ICD-10-CM | POA: Diagnosis not present

## 2018-05-01 NOTE — Progress Notes (Signed)
Subjective:   Patient ID: Gloria Werner, female   DOB: 77 y.o.   MRN: [id number]   HPI Patient presents stating she is developed a lot of pain in the bottom of the heel over the last 6 weeks and has tried over-the-counter insoles which helped a little bit ice therapy but the pain remains intense.  Patient does not smoke and likes to be active   Review of Systems  All other systems reviewed and are negative.       Objective:  Physical Exam  Constitutional: She appears well-developed and well-nourished.  Cardiovascular: Intact distal pulses.  Pulmonary/Chest: Effort normal.  Musculoskeletal: Normal range of motion.  Neurological: She is alert.  Skin: Skin is warm.  Nursing note and vitals reviewed.   Neurovascular status intact muscle strength adequate range of motion within normal limits with patient found to have exquisite discomfort left plantar fascia at the insertional point of the tendon into the calcaneus.  Patient is noted to have good digital perfusion is well oriented x3 with no pain of the calcaneal bone itself     Assessment:  Acute plantar fasciitis left     Plan:  H&P x-ray reviewed and today I injected the left plantar fascia 3 mg Kenalog 5 mg Xylocaine applied fascial brace and gave instructions on physical therapy and shoe gear modifications.  Reappoint to recheck again in the next several weeks  X-ray indicates small spur with no indication of stress fracture or arthritis

## 2018-05-13 ENCOUNTER — Ambulatory Visit: Payer: Medicare Other | Admitting: Podiatry

## 2018-05-13 ENCOUNTER — Encounter: Payer: Self-pay | Admitting: Podiatry

## 2018-05-13 DIAGNOSIS — M722 Plantar fascial fibromatosis: Secondary | ICD-10-CM

## 2018-05-13 NOTE — Patient Instructions (Signed)

## 2018-05-13 NOTE — Progress Notes (Signed)
Subjective:   Patient ID: Gloria Werner, female   DOB: 77 y.o.   MRN: [id number]   HPI Patient presents with significant reduction of discomfort in the plantar fascia and states she is walking better with a better heel toe gait pattern.  Patient states she still has occasional pain   ROS      Objective:  Physical Exam  Neurovascular status intact muscle strength is adequate with patient's left heel feeling much better than it was previously with mild discomfort upon deep palpation     Assessment:  Plantar fasciitis left that is improving     Plan:  Reviewed condition and recommended physical therapy supportive shoes and patient will be seen back as needed

## 2018-05-27 ENCOUNTER — Encounter: Payer: Self-pay | Admitting: Obstetrics & Gynecology

## 2018-05-27 ENCOUNTER — Ambulatory Visit: Payer: Medicare Other | Admitting: Obstetrics & Gynecology

## 2018-05-27 VITALS — BP 148/96 | HR 88 | Resp 16 | Ht 60.0 in | Wt 145.8 lb

## 2018-05-27 DIAGNOSIS — N899 Noninflammatory disorder of vagina, unspecified: Secondary | ICD-10-CM

## 2018-05-27 DIAGNOSIS — N819 Female genital prolapse, unspecified: Secondary | ICD-10-CM

## 2018-05-27 DIAGNOSIS — N898 Other specified noninflammatory disorders of vagina: Secondary | ICD-10-CM

## 2018-05-27 NOTE — Progress Notes (Signed)
GYNECOLOGY  VISIT  CC:   Pessary check  HPI: 77 y.o. G29P2002 [name] here for pessary check.  Feels pessary has been working well.  Denies any vaginal bleeding or spotting.  Denies vaginal discharge.  If applying estrogen cream with her finger to make sure it is in the best place.  Feels this must have helped some as she has not had any discharge for several weeks.  Emptying bladder without problems.    GYNECOLOGIC HISTORY: Patient's last menstrual period was 12/25/1986 (approximate). Contraception: post menopausal  Menopausal hormone therapy: premarin vag cream   Patient Active Problem List   Diagnosis Date Noted  . Seasonal affective disorder ([place]) 01/17/2017  . Lichen simplex chronicus 04/08/2015  . Osteopenia 04/08/2015  . Depression 03/05/2015  . ALLERGIC RHINITIS, SEASONAL 02/03/2008  . IRRITABLE BOWEL SYNDROME 02/03/2008    Past Medical History:  Diagnosis Date  . Allergic rhinitis due to pollen   . Irritable bowel syndrome   . 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 ABDOMINAL HYSTERECTOMY  1988   secondary to prolapse  . BLADDER SUSPENSION     A-P with Hyst  . NASAL SINUS SURGERY  1970  . TONSILLECTOMY      MEDS:   Current Outpatient Medications on File Prior to Visit  Medication Sig Dispense Refill  . buPROPion (WELLBUTRIN) 75 MG tablet Take 1 tablet (75 mg total) by mouth 2 (two) times daily. 180 tablet 3  . calcium carbonate (OS-CAL) 600 MG TABS Take 600 mg by mouth 2 (two) times daily with a meal.      . conjugated estrogens (PREMARIN) vaginal cream Use 1/2 g vaginally every other night for the next 2 weeks, then 2 x a week at hs 30 g 1  . Multiple Vitamin (MULTIVITAMIN) tablet Take 1 tablet by mouth daily.      . Pyridoxine HCl (B-6 PO) Take 1 tablet by mouth daily.    Marland Kitchen saccharomyces boulardii (FLORASTOR) 250 MG capsule Take 2 capsules (500 mg total) by mouth 2 (two) times daily. 120 capsule 3   . triamcinolone (NASACORT) 55 MCG/ACT AERO nasal inhaler Place 2 sprays into the nose daily.    Marland Kitchen triamcinolone ointment (KENALOG) 0.5 % Apply BID for up to 7 days with flare. 15 g 12  . vitamin C (ASCORBIC ACID) 500 MG tablet Take 500 mg by mouth daily.    . Vitamin D, Ergocalciferol, (DRISDOL) 50000 units CAPS capsule Take 1 capsule (50,000 Units total) by mouth every 30 (thirty) days. 1 capsule 3  . diclofenac (VOLTAREN) 75 MG EC tablet Take 1 tablet (75 mg total) by mouth 2 (two) times daily. (Patient not taking: Reported on 05/27/2018) 50 tablet 2   No current facility-administered medications on file prior to visit.     ALLERGIES: Codeine and Lexapro [escitalopram]  Family History  Problem Relation Age of Onset  . Emphysema Mother   . Lymphoma Mother   . Asthma Mother   . Cancer Father 52       lung cancer  . Coronary artery disease Brother   . Cancer Brother        breast cancer  . Cancer Brother        lung cancer  . Diabetes Neg Hx   . Hypertension Neg Hx   . Colon cancer Neg Hx     SH:  Married, non smoker  Review of Systems  All other systems reviewed and  are negative.   PHYSICAL EXAMINATION:    Ht 5' (1.524 m)   LMP 12/25/1986 (Approximate)   BMI 28.71 kg/m     General appearance: alert, cooperative and appears stated age Lymph:  No inguinal LaD  Pelvic: External genitalia:  no lesions              Urethra:  normal appearing urethra with no masses, tenderness or lesions              Bartholins and Skenes: normal                 Vagina: significantly improved ulceration, now essentially healed..  No blood present.  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  Pessary was removed and cleansed              Cervix: absent              Bimanual Exam:  Uterus:  uterus absent              Adnexa: no mass, fullness, tenderness  Chaperone was present for exam.  Assessment: Much improved vaginal  ulceration/vaginal erosion from pessary use Vaginal vault prolapse  Plan: Continue to use the estrogen and apply with her fingers Recheck 1 month to make sure the improvement has continued  ~15 minutes spent with patient >50% of time was in face to face discussion of above.

## 2018-05-31 ENCOUNTER — Ambulatory Visit (INDEPENDENT_AMBULATORY_CARE_PROVIDER_SITE_OTHER): Payer: Medicare Other | Admitting: Obstetrics & Gynecology

## 2018-05-31 ENCOUNTER — Telehealth: Payer: Self-pay | Admitting: Obstetrics & Gynecology

## 2018-05-31 VITALS — BP 150/86 | HR 76 | Resp 16 | Ht 60.0 in | Wt 145.0 lb

## 2018-05-31 DIAGNOSIS — N819 Female genital prolapse, unspecified: Secondary | ICD-10-CM

## 2018-05-31 DIAGNOSIS — N898 Other specified noninflammatory disorders of vagina: Secondary | ICD-10-CM

## 2018-05-31 DIAGNOSIS — N899 Noninflammatory disorder of vagina, unspecified: Secondary | ICD-10-CM

## 2018-05-31 DIAGNOSIS — N939 Abnormal uterine and vaginal bleeding, unspecified: Secondary | ICD-10-CM

## 2018-05-31 NOTE — Telephone Encounter (Signed)
Spoke with patient. Pessary replaced while in office 6/3. Had been out for 2 mo to allow for vaginal ulceration to heal.   Patient reports bleeding started again on 6/5, describes as pale pink spotting, no odor. Pessary still in place.   Denies pain, fever/chills or urinary complaints.   Advised will review with Dr. Sabra Heck and return call, patient agreeable.   Dr. Sabra Heck -please review and advise.

## 2018-05-31 NOTE — Telephone Encounter (Signed)
Patient returning call to Jill. °

## 2018-05-31 NOTE — Telephone Encounter (Signed)
Left detailed message, ok per dpr. Advised patient Dr. Sabra Heck does recommend OV for further evaluation, please return call to office to schedule.   Reviewed with Dr. Sabra Heck, can schedule for today or Monday.

## 2018-05-31 NOTE — Telephone Encounter (Signed)
Patient returned a call to Jill.   

## 2018-05-31 NOTE — Telephone Encounter (Signed)
I should probably look.  4:15PM appt?

## 2018-05-31 NOTE — Telephone Encounter (Signed)
Call to patient. Appointment scheduled for 4:15 today.   Encounter closed.

## 2018-05-31 NOTE — Progress Notes (Signed)
GYNECOLOGY  VISIT  CC:   Vaginal bleeding  HPI: 77 y.o. G23P2002 [name] here for complaint of vaginal bleeding.  Pessary was just replaced on Monday after having been out for several months due to area of granulation tissue at the vaginal cuff that would not resolve.  Once pt started putting estrogen cream onto the area with her finger, granulation tissue finally resolved.  Noted last night some discharge and then bleeding today.  Denies significant odor.  Denies pelvic pain or urinary symptoms.    GYNECOLOGIC HISTORY: Patient's last menstrual period was 12/25/1986 (approximate). Contraception: post menopausal  Menopausal hormone therapy: none  Patient Active Problem List   Diagnosis Date Noted  . Seasonal affective disorder ([place]) 01/17/2017  . Lichen simplex chronicus 04/08/2015  . Osteopenia 04/08/2015  . Depression 03/05/2015  . ALLERGIC RHINITIS, SEASONAL 02/03/2008  . IRRITABLE BOWEL SYNDROME 02/03/2008    Past Medical History:  Diagnosis Date  . Allergic rhinitis due to pollen   . Irritable bowel syndrome   . 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 ABDOMINAL HYSTERECTOMY  1988   secondary to prolapse  . BLADDER SUSPENSION     A-P with Hyst  . NASAL SINUS SURGERY  1970  . TONSILLECTOMY      MEDS:   Current Outpatient Medications on File Prior to Visit  Medication Sig Dispense Refill  . buPROPion (WELLBUTRIN) 75 MG tablet Take 1 tablet (75 mg total) by mouth 2 (two) times daily. 180 tablet 3  . calcium carbonate (OS-CAL) 600 MG TABS Take 600 mg by mouth 2 (two) times daily with a meal.      . conjugated estrogens (PREMARIN) vaginal cream Use 1/2 g vaginally every other night for the next 2 weeks, then 2 x a week at hs 30 g 1  . diclofenac (VOLTAREN) 75 MG EC tablet Take 1 tablet (75 mg total) by mouth 2 (two) times daily. (Patient not taking: Reported on 05/27/2018) 50 tablet 2  . Multiple Vitamin  (MULTIVITAMIN) tablet Take 1 tablet by mouth daily.      . Pyridoxine HCl (B-6 PO) Take 1 tablet by mouth daily.    Marland Kitchen saccharomyces boulardii (FLORASTOR) 250 MG capsule Take 2 capsules (500 mg total) by mouth 2 (two) times daily. 120 capsule 3  . triamcinolone (NASACORT) 55 MCG/ACT AERO nasal inhaler Place 2 sprays into the nose daily.    Marland Kitchen triamcinolone ointment (KENALOG) 0.5 % Apply BID for up to 7 days with flare. 15 g 12  . vitamin C (ASCORBIC ACID) 500 MG tablet Take 500 mg by mouth daily.    . Vitamin D, Ergocalciferol, (DRISDOL) 50000 units CAPS capsule Take 1 capsule (50,000 Units total) by mouth every 30 (thirty) days. 1 capsule 3   No current facility-administered medications on file prior to visit.     ALLERGIES: Codeine and Lexapro [escitalopram]  Family History  Problem Relation Age of Onset  . Emphysema Mother   . Lymphoma Mother   . Asthma Mother   . Cancer Father 27       lung cancer  . Coronary artery disease Brother   . Cancer Brother        breast cancer  . Cancer Brother        lung cancer  . Diabetes Neg Hx   . Hypertension Neg Hx   . Colon cancer Neg Hx     SH:  Married, non smoker  Review  of Systems  Constitutional: Negative.   Gastrointestinal: Negative.   Genitourinary:       Vaginal bleeding    PHYSICAL EXAMINATION:    LMP 12/25/1986 (Approximate)     General appearance: alert, cooperative and appears stated age Lymph:  No inguinal LAD  Pelvic: External genitalia:  no lesions              Urethra:  normal appearing urethra with no masses, tenderness or lesions              Bartholins and Skenes: normal                 Vagina: Pessary was removed.  Vagina almost appears denuded of the mucosa with tissue that bleeds very easily upon being touched with the scopette.  There is no evidence of granulation tissue.              Cervix: absent              Bimanual Exam:  Uterus:  uterus absent              Adnexa: no mass, fullness,  tenderness  Chaperone was present for exam.  Assessment: Vaginal vault prolapse Vaginal bleeding that appears due to denuded vaginal mucosa  Plan: Pessary will be left out for at least two weeks.  Pt will use vaginal estrogen cream.  #4 more stiff pessary will be ordered for pt.     ~15 minutes spent with patient >50% of time was in face to face discussion of above.

## 2018-05-31 NOTE — Telephone Encounter (Signed)
Patient had pessary reinserted on Monday and she is bleeding.

## 2018-06-03 ENCOUNTER — Ambulatory Visit (INDEPENDENT_AMBULATORY_CARE_PROVIDER_SITE_OTHER): Payer: Medicare Other | Admitting: Internal Medicine

## 2018-06-03 ENCOUNTER — Encounter: Payer: Self-pay | Admitting: Internal Medicine

## 2018-06-03 ENCOUNTER — Encounter: Payer: Self-pay | Admitting: Obstetrics & Gynecology

## 2018-06-03 VITALS — BP 148/88 | HR 69 | Temp 98.1°F | Wt 146.2 lb

## 2018-06-03 DIAGNOSIS — I10 Essential (primary) hypertension: Secondary | ICD-10-CM | POA: Diagnosis not present

## 2018-06-03 MED ORDER — LISINOPRIL 10 MG PO TABS: 10.0000 mg | ORAL_TABLET | Freq: Every day | ORAL | 0 refills | Status: DC

## 2018-06-03 NOTE — Progress Notes (Signed)
Subjective:    Patient ID: Gloria Werner, female    DOB: February 22, 1941, 77 y.o.   MRN: [id number]  HPI  Pt presents to the clinic today with c/o elevated blood pressures. She reports over the last year, she has really noticed a rise in her blood pressure. She reports at other doctor's visits, it usually ranges 140's/90's. She denies headaches, dizziness, visual changes or chest pain. She has never been treated for HTN in the past. Her BP today is 148/88.  Review of Systems  Past Medical History:  Diagnosis Date  . Allergic rhinitis due to pollen   . Irritable bowel syndrome   . Lichen simplex chronicus 10/05    Current Outpatient Medications  Medication Sig Dispense Refill  . buPROPion (WELLBUTRIN) 75 MG tablet Take 1 tablet (75 mg total) by mouth 2 (two) times daily. 180 tablet 3  . calcium carbonate (OS-CAL) 600 MG TABS Take 600 mg by mouth 2 (two) times daily with a meal.      . conjugated estrogens (PREMARIN) vaginal cream Use 1/2 g vaginally every other night for the next 2 weeks, then 2 x a week at hs 30 g 1  . diclofenac (VOLTAREN) 75 MG EC tablet Take 1 tablet (75 mg total) by mouth 2 (two) times daily. 50 tablet 2  . Multiple Vitamin (MULTIVITAMIN) tablet Take 1 tablet by mouth daily.      . Pyridoxine HCl (B-6 PO) Take 1 tablet by mouth daily.    Marland Kitchen saccharomyces boulardii (FLORASTOR) 250 MG capsule Take 2 capsules (500 mg total) by mouth 2 (two) times daily. 120 capsule 3  . triamcinolone (NASACORT) 55 MCG/ACT AERO nasal inhaler Place 2 sprays into the nose daily.    Marland Kitchen triamcinolone ointment (KENALOG) 0.5 % Apply BID for up to 7 days with flare. 15 g 12  . vitamin C (ASCORBIC ACID) 500 MG tablet Take 500 mg by mouth daily.    . Vitamin D, Ergocalciferol, (DRISDOL) 50000 units CAPS capsule Take 1 capsule (50,000 Units total) by mouth every 30 (thirty) days. 1 capsule 3   No current facility-administered medications for this visit.     Allergies  Allergen Reactions    . Codeine Nausea And Vomiting  . Lexapro [Escitalopram] Diarrhea    headache    Family History  Problem Relation Age of Onset  . Emphysema Mother   . Lymphoma Mother   . Asthma Mother   . Cancer Father 14       lung cancer  . Coronary artery disease Brother   . Cancer Brother        breast cancer  . Cancer Brother        lung cancer  . Diabetes Neg Hx   . Hypertension Neg Hx   . Colon cancer Neg Hx     Social History   Socioeconomic History  . Marital status: Married    Spouse name: Not on file  . Number of children: 2  . Years of education: 60  . Highest education level: Not on file  Occupational History  . Occupation: Environmental health practitioner  Social Needs  . Financial resource strain: Not on file  . Food insecurity:    Worry: Not on file    Inability: Not on file  . Transportation needs:    Medical: Not on file    Non-medical: Not on file  Tobacco Use  . Smoking status: Never Smoker  . Smokeless tobacco: Never Used  Substance and Sexual Activity  .  Alcohol use: No  . Drug use: No  . Sexual activity: Yes    Partners: Male    Birth control/protection: Surgical  Lifestyle  . Physical activity:    Days per week: Not on file    Minutes per session: Not on file  . Stress: Not on file  Relationships  . Social connections:    Talks on phone: Not on file    Gets together: Not on file    Attends religious service: Not on file    Active member of club or organization: Not on file    Attends meetings of clubs or organizations: Not on file    Relationship status: Not on file  . Intimate partner violence:    Fear of current or ex partner: Not on file    Emotionally abused: Not on file    Physically abused: Not on file    Forced sexual activity: Not on file  Other Topics Concern  . Not on file  Social History Narrative   HSG. Married '63. 1 son- 94'; 1-daughter-'68; grandchildren 3. 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  Marriage in good health. 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 Provided lead to [email or web address].     Constitutional: Denies fever, malaise, fatigue, headache or abrupt weight changes.  Respiratory: Denies difficulty breathing, shortness of breath, cough or sputum production.   Cardiovascular: Denies chest pain, chest tightness, palpitations or swelling in the hands or feet.   Neurological: Denies dizziness, difficulty with memory, difficulty with speech or problems with balance and coordination.    No other specific complaints in a complete review of systems (except as listed in HPI above).     Objective:   Physical Exam   BP (!) 148/88   Pulse 69   Temp 98.1 F (36.7 C) (Oral)   Wt 146 lb 4 oz (66.3 kg)   LMP 12/25/1986 (Approximate)   SpO2 95%   BMI 28.56 kg/m  Wt Readings from Last 3 Encounters:  06/03/18 146 lb 4 oz (66.3 kg)  05/31/18 145 lb (65.8 kg)  05/27/18 145 lb 12.8 oz (66.1 kg)    General: Appears her stated age, well developed, well nourished in NAD. Cardiovascular: Normal rate and rhythm. S1,S2 noted.  No murmur, rubs or gallops noted.  Pulmonary/Chest: Normal effort and positive vesicular breath sounds. No respiratory distress. No wheezes, rales or ronchi noted.  Neurological: Alert and oriented. Coordination normal.    BMET    Component Value Date/Time   NA 141 08/13/2017 0842   K 3.9 08/13/2017 0842   CL 108 08/13/2017 0842   CO2 26 08/13/2017 0842   GLUCOSE 87 08/13/2017 0842   BUN 19 08/13/2017 0842   CREATININE 0.84 08/13/2017 0842   CALCIUM 9.2 08/13/2017 0842   GFRNONAA 90 02/26/2008 1019   GFRAA 108 02/26/2008 1019    Lipid Panel     Component Value Date/Time   CHOL 179 08/13/2017 0842   TRIG 71.0 08/13/2017 0842   HDL 52.50 08/13/2017 0842   CHOLHDL 3 08/13/2017 0842   VLDL 14.2 08/13/2017 0842   LDLCALC 112 (H) 08/13/2017 0842    CBC    Component Value Date/Time   WBC 4.6 08/13/2017  0842   RBC 4.55 08/13/2017 0842   HGB 13.8 08/13/2017 0842   HCT 41.5 08/13/2017 0842   PLT 183.0 08/13/2017 0842   MCV 91.2 08/13/2017 0842   MCHC 33.2 08/13/2017 0842  RDW 13.3 08/13/2017 0842   LYMPHSABS 1.0 10/21/2015 1421   MONOABS 1.0 10/21/2015 1421   EOSABS 0.2 10/21/2015 1421   BASOSABS 0.1 10/21/2015 1421    Hgb A1C No results found for: HGBA1C         Assessment & Plan:

## 2018-06-03 NOTE — Patient Instructions (Signed)

## 2018-06-03 NOTE — Assessment & Plan Note (Signed)
Discussed DASH diet and exercise eRx for Lisinopril 10 mg PO daily  RTC in 2 weeks for follow up of HTN

## 2018-06-13 ENCOUNTER — Ambulatory Visit (INDEPENDENT_AMBULATORY_CARE_PROVIDER_SITE_OTHER): Payer: Medicare Other | Admitting: Obstetrics & Gynecology

## 2018-06-13 ENCOUNTER — Encounter: Payer: Self-pay | Admitting: Obstetrics & Gynecology

## 2018-06-13 VITALS — BP 126/80 | HR 88 | Resp 16 | Ht 60.0 in | Wt 144.6 lb

## 2018-06-13 DIAGNOSIS — N819 Female genital prolapse, unspecified: Secondary | ICD-10-CM | POA: Diagnosis not present

## 2018-06-13 NOTE — Progress Notes (Signed)
GYNECOLOGY  VISIT  CC:   Follow-up for pessary  HPI: 77 y.o. G19P2002 [name] here for f/u of vaginal bleeding due to vaginal erosion, pessary was removed.  Bleeding stopped within two or three days of pessary removal.  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  As a result, she feels she can get along better without the pessary (although this really helps her).  She also can consider doing surgery at this time.  Does not want to any surgery that is not vaginal or laparoscopic.  I feel she could benefit from considering vaginal approach.  She is still SA and has considered colpocleisis but knows this would be eliminating the possibility of future sexual activity.  GYNECOLOGIC HISTORY: Patient's last menstrual period was 12/25/1986 (approximate). Contraception: hysterectomy Menopausal hormone therapy: premarin vaginal cream  Patient Active Problem List   Diagnosis Date Noted  . Essential hypertension 06/03/2018  . Seasonal affective disorder ([place]) 01/17/2017  . Lichen simplex chronicus 04/08/2015  . Osteopenia 04/08/2015  . Depression 03/05/2015  . ALLERGIC RHINITIS, SEASONAL 02/03/2008  . IRRITABLE BOWEL SYNDROME 02/03/2008    Past Medical History:  Diagnosis Date  . Allergic rhinitis due to pollen   . Irritable bowel syndrome   . 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 ABDOMINAL HYSTERECTOMY  1988   secondary to prolapse  . BLADDER SUSPENSION     A-P with Hyst  . NASAL SINUS SURGERY  1970  . TONSILLECTOMY      MEDS:   Current Outpatient Medications on File Prior to Visit  Medication Sig Dispense Refill  . buPROPion (WELLBUTRIN) 75 MG tablet Take 1 tablet (75 mg total) by mouth 2 (two) times daily. 180 tablet 3  . calcium carbonate (OS-CAL) 600 MG TABS Take 600 mg by mouth 2 (two) times daily with a meal.       . conjugated estrogens (PREMARIN) vaginal cream Use 1/2 g vaginally every other night for the next 2 weeks, then 2 x a week at hs 30 g 1  . diclofenac (VOLTAREN) 75 MG EC tablet Take 1 tablet (75 mg total) by mouth 2 (two) times daily. 50 tablet 2  . lisinopril (PRINIVIL,ZESTRIL) 10 MG tablet Take 1 tablet (10 mg total) by mouth daily. 30 tablet 0  . Multiple Vitamin (MULTIVITAMIN) tablet Take 1 tablet by mouth daily.      . Pyridoxine HCl (B-6 PO) Take 1 tablet by mouth daily.    Marland Kitchen saccharomyces boulardii (FLORASTOR) 250 MG capsule Take 2 capsules (500 mg total) by mouth 2 (two) times daily. 120 capsule 3  . triamcinolone (NASACORT) 55 MCG/ACT AERO nasal inhaler Place 2 sprays into the nose daily.    Marland Kitchen triamcinolone ointment (KENALOG) 0.5 % Apply BID for up to 7 days with flare. 15 g 12  . vitamin C (ASCORBIC ACID) 500 MG tablet Take 500 mg by mouth daily.    . Vitamin D, Ergocalciferol, (DRISDOL) 50000 units CAPS capsule Take 1 capsule (50,000 Units total) by mouth every 30 (thirty) days. 1 capsule 3   No current facility-administered medications on file prior to visit.     ALLERGIES: Codeine and Lexapro [escitalopram]  Family History  Problem Relation Age of Onset  . Emphysema Mother   . Lymphoma Mother   . Asthma Mother   . Cancer  Father 59       lung cancer  . Coronary artery disease Brother   . Cancer Brother        breast cancer  . Cancer Brother        lung cancer  . Diabetes Neg Hx   . Hypertension Neg Hx   . Colon cancer Neg Hx     SH:  Married, non smoker  Review of Systems  All other systems reviewed and are negative.   PHYSICAL EXAMINATION:    BP 126/80 (BP Location: Left Arm, Patient Position: Sitting, Cuff Size: Normal)   Pulse 88   Resp 16   Ht 5' (1.524 m)   Wt 144 lb 9.6 oz (65.6 kg)   LMP 12/25/1986 (Approximate)   BMI 28.24 kg/m     General appearance: alert, cooperative and appears stated age Abdomen: soft, non-tender; bowel sounds normal; no  masses,  no organomegaly Lymph:  No LAD  Pelvic: External genitalia:  no lesions              Urethra:  normal appearing urethra with no masses, tenderness or lesions              Bartholins and Skenes: normal                 Vagina: area of prior granulation tissue at right apex.  Granulation tissue gone but complete epithelization of tissue is not completed.  Additional tissue changes are back to normal.  Vault prolapse with some paravaginal defect present as well.              Cervix: absent              Bimanual Exam:  Uterus:  uterus absent              Adnexa: no mass, fullness, tenderness   Chaperone was present for exam.  Assessment: Vaginal vault prolapse Difficulty with pessary due to recurrent granulation tissue/vaginal bleeding  Plan: Referral to Dr. Matilde Sprang will be made to consider vaginal repair of vault prolapse.  Pt comfortable with this plan.   Will use pessary only intermittently when going to be very active to help prevent recurrence of vaginal tissue issues   ~20 minutes spent with patient >50% of time was in face to face discussion of above.

## 2018-06-19 ENCOUNTER — Encounter: Payer: Self-pay | Admitting: Internal Medicine

## 2018-06-19 ENCOUNTER — Ambulatory Visit (INDEPENDENT_AMBULATORY_CARE_PROVIDER_SITE_OTHER): Payer: Medicare Other | Admitting: Internal Medicine

## 2018-06-19 DIAGNOSIS — I10 Essential (primary) hypertension: Secondary | ICD-10-CM | POA: Diagnosis not present

## 2018-06-19 MED ORDER — LISINOPRIL 10 MG PO TABS: 10.0000 mg | ORAL_TABLET | Freq: Every day | ORAL | 0 refills | Status: DC

## 2018-06-19 NOTE — Assessment & Plan Note (Signed)
Improved on Lisinopril, refilled today Try antihistamine for cough, if persist, will trial off Lisinopril Reinforced DASH diet, increase fluid intake

## 2018-06-19 NOTE — Progress Notes (Signed)
Subjective:    Patient ID: Gloria Werner, female    DOB: 1941-10-09, 77 y.o.   MRN: [id number]  HPI  Pt presents to the clinic today for 2 week follow up of HTN. At her last visit, she was started om Lisinopril 10 mg. She has been taking the medication as prescribed. She has developed a slight cough. She reports her BP's at home run 90/50- 120/70. When her BP is low, she's gets fatigued, but not dizzy or near syncopal. Her BP today is 118/72.  Review of Systems      Past Medical History:  Diagnosis Date  . Allergic rhinitis due to pollen   . Irritable bowel syndrome   . Lichen simplex chronicus 10/05    Current Outpatient Medications  Medication Sig Dispense Refill  . buPROPion (WELLBUTRIN) 75 MG tablet Take 1 tablet (75 mg total) by mouth 2 (two) times daily. 180 tablet 3  . calcium carbonate (OS-CAL) 600 MG TABS Take 600 mg by mouth 2 (two) times daily with a meal.      . conjugated estrogens (PREMARIN) vaginal cream Use 1/2 g vaginally every other night for the next 2 weeks, then 2 x a week at hs 30 g 1  . diclofenac (VOLTAREN) 75 MG EC tablet Take 1 tablet (75 mg total) by mouth 2 (two) times daily. 50 tablet 2  . lisinopril (PRINIVIL,ZESTRIL) 10 MG tablet Take 1 tablet (10 mg total) by mouth daily. 30 tablet 0  . Multiple Vitamin (MULTIVITAMIN) tablet Take 1 tablet by mouth daily.      . Pyridoxine HCl (B-6 PO) Take 1 tablet by mouth daily.    Marland Kitchen saccharomyces boulardii (FLORASTOR) 250 MG capsule Take 2 capsules (500 mg total) by mouth 2 (two) times daily. 120 capsule 3  . triamcinolone (NASACORT) 55 MCG/ACT AERO nasal inhaler Place 2 sprays into the nose daily.    Marland Kitchen triamcinolone ointment (KENALOG) 0.5 % Apply BID for up to 7 days with flare. 15 g 12  . vitamin C (ASCORBIC ACID) 500 MG tablet Take 500 mg by mouth daily.    . Vitamin D, Ergocalciferol, (DRISDOL) 50000 units CAPS capsule Take 1 capsule (50,000 Units total) by mouth every 30 (thirty) days. 1 capsule 3    No current facility-administered medications for this visit.     Allergies  Allergen Reactions  . Codeine Nausea And Vomiting  . Lexapro [Escitalopram] Diarrhea    headache    Family History  Problem Relation Age of Onset  . Emphysema Mother   . Lymphoma Mother   . Asthma Mother   . Cancer Father 50       lung cancer  . Coronary artery disease Brother   . Cancer Brother        breast cancer  . Cancer Brother        lung cancer  . Diabetes Neg Hx   . Hypertension Neg Hx   . Colon cancer Neg Hx     Social History   Socioeconomic History  . Marital status: Married    Spouse name: Not on file  . Number of children: 2  . Years of education: 28  . Highest education level: Not on file  Occupational History  . Occupation: Environmental health practitioner  Social Needs  . Financial resource strain: Not on file  . Food insecurity:    Worry: Not on file    Inability: Not on file  . Transportation needs:    Medical: Not on file  Non-medical: Not on file  Tobacco Use  . Smoking status: Never Smoker  . Smokeless tobacco: Never Used  Substance and Sexual Activity  . Alcohol use: No  . Drug use: No  . Sexual activity: Yes    Partners: Male    Birth control/protection: Surgical  Lifestyle  . Physical activity:    Days per week: Not on file    Minutes per session: Not on file  . Stress: Not on file  Relationships  . Social connections:    Talks on phone: Not on file    Gets together: Not on file    Attends religious service: Not on file    Active member of club or organization: Not on file    Attends meetings of clubs or organizations: Not on file    Relationship status: Not on file  . Intimate partner violence:    Fear of current or ex partner: Not on file    Emotionally abused: Not on file    Physically abused: Not on file    Forced sexual activity: Not on file  Other Topics Concern  . Not on file  Social History Narrative   HSG. Married '63. 1 son- 10';  1-daughter-'68; grandchildren 3. 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 Marriage in good health. 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 Provided lead to [email or web address].     Constitutional: Denies fever, malaise, fatigue, headache or abrupt weight changes.  HEENT: Denies eye pain, eye redness, ear pain, ringing in the ears, wax buildup, runny nose, nasal congestion, bloody nose, or sore throat. Respiratory: Pt reports cough. Denies difficulty breathing, shortness of breath, or sputum production.   Cardiovascular: Denies chest pain, chest tightness, palpitations or swelling in the hands or feet.  Neurological: Denies dizziness, difficulty with memory, difficulty with speech or problems with balance and coordination.    No other specific complaints in a complete review of systems (except as listed in HPI above).  Objective:   Physical Exam  BP 118/72   Pulse 86   Temp 98.4 F (36.9 C) (Oral)   Wt 144 lb (65.3 kg)   LMP 12/25/1986 (Approximate)   SpO2 98%   BMI 28.12 kg/m  Wt Readings from Last 3 Encounters:  06/19/18 144 lb (65.3 kg)  06/13/18 144 lb 9.6 oz (65.6 kg)  06/03/18 146 lb 4 oz (66.3 kg)    General: Appears her stated age, well developed, well nourished in NAD. HEENT: Ears: Tm's gray and intact, normal light reflex; Nose: mucosa pink and moist, septum midline; Throat/Mouth: Teeth present, mucosa pink and moist, + PND, no exudate, lesions or ulcerations noted.  Cardiovascular: Normal rate and rhythm. S1,S2 noted.  No murmur, rubs or gallops noted. Pulmonary/Chest: Normal effort and positive vesicular breath sounds. No respiratory distress. No wheezes, rales or ronchi noted.   Neurological: Alert and oriented.    BMET    Component Value Date/Time   NA 141 08/13/2017 0842   K 3.9 08/13/2017 0842   CL 108 08/13/2017 0842   CO2 26 08/13/2017 0842   GLUCOSE 87 08/13/2017 0842   BUN 19  08/13/2017 0842   CREATININE 0.84 08/13/2017 0842   CALCIUM 9.2 08/13/2017 0842   GFRNONAA 90 02/26/2008 1019   GFRAA 108 02/26/2008 1019    Lipid Panel     Component Value Date/Time   CHOL 179 08/13/2017 0842   TRIG 71.0 08/13/2017 0842   HDL 52.50 08/13/2017 0938  CHOLHDL 3 08/13/2017 0842   VLDL 14.2 08/13/2017 0842   LDLCALC 112 (H) 08/13/2017 0842    CBC    Component Value Date/Time   WBC 4.6 08/13/2017 0842   RBC 4.55 08/13/2017 0842   HGB 13.8 08/13/2017 0842   HCT 41.5 08/13/2017 0842   PLT 183.0 08/13/2017 0842   MCV 91.2 08/13/2017 0842   MCHC 33.2 08/13/2017 0842   RDW 13.3 08/13/2017 0842   LYMPHSABS 1.0 10/21/2015 1421   MONOABS 1.0 10/21/2015 1421   EOSABS 0.2 10/21/2015 1421   BASOSABS 0.1 10/21/2015 1421    Hgb A1C No results found for: HGBA1C          Assessment & Plan:

## 2018-06-19 NOTE — Patient Instructions (Signed)

## 2018-07-01 ENCOUNTER — Ambulatory Visit: Payer: Medicare Other | Admitting: Obstetrics & Gynecology

## 2018-07-03 DIAGNOSIS — M654 Radial styloid tenosynovitis [de Quervain]: Secondary | ICD-10-CM | POA: Diagnosis not present

## 2018-07-03 DIAGNOSIS — R52 Pain, unspecified: Secondary | ICD-10-CM | POA: Diagnosis not present

## 2018-07-03 DIAGNOSIS — M1812 Unilateral primary osteoarthritis of first carpometacarpal joint, left hand: Secondary | ICD-10-CM | POA: Insufficient documentation

## 2018-07-03 HISTORY — DX: Radial styloid tenosynovitis (de quervain): M65.4

## 2018-07-04 ENCOUNTER — Telehealth: Payer: Self-pay | Admitting: Obstetrics & Gynecology

## 2018-07-04 NOTE — Telephone Encounter (Signed)
Called placed in reference to a referral to [facility].

## 2018-07-04 NOTE — Telephone Encounter (Signed)
Patient returned call to [facility]. I accepted the call on Gloria Werner's behalf, as she was assisting another patient. Advised patient [facility] has multiple messages requesting her to return their calls to schedule appointment with with Dr Matilde Sprang, as recommended by Dr Sabra Heck. Patient advises she has not received any message from [facility]. I have provided patient the phone number of [phone] for [facility], and encouraged patient to call their office for scheduling.   cc: Magdalene Patricia

## 2018-07-08 ENCOUNTER — Encounter: Payer: Self-pay | Admitting: Obstetrics & Gynecology

## 2018-07-08 ENCOUNTER — Ambulatory Visit (INDEPENDENT_AMBULATORY_CARE_PROVIDER_SITE_OTHER): Payer: Medicare Other | Admitting: Obstetrics & Gynecology

## 2018-07-08 VITALS — BP 122/80 | HR 84 | Resp 16 | Ht 60.0 in | Wt 149.2 lb

## 2018-07-08 DIAGNOSIS — N898 Other specified noninflammatory disorders of vagina: Secondary | ICD-10-CM

## 2018-07-08 DIAGNOSIS — N899 Noninflammatory disorder of vagina, unspecified: Secondary | ICD-10-CM | POA: Diagnosis not present

## 2018-07-08 DIAGNOSIS — N819 Female genital prolapse, unspecified: Secondary | ICD-10-CM

## 2018-07-08 DIAGNOSIS — N939 Abnormal uterine and vaginal bleeding, unspecified: Secondary | ICD-10-CM

## 2018-07-08 NOTE — Progress Notes (Signed)
GYNECOLOGY  VISIT  CC:   Vaginal bleeding  HPI: 77 y.o. G56P2002 [name] with hx of vaginal vault prolapse here with complaint of vaginal bleeding.  Had her pessary in yesterday and was doing a lot of walking.  She took it out today and had bleeding with removal.  Denies vaginal discharge but has had some spotting (pinkish) since pessary removal.  Denies urinary symptoms.    GYNECOLOGIC HISTORY: Patient's last menstrual period was 12/25/1986 (approximate). Contraception: post menopausal  Menopausal hormone therapy: premarin vag cream  Patient Active Problem List   Diagnosis Date Noted  . Essential hypertension 06/03/2018  . Seasonal affective disorder ([place]) 01/17/2017  . Lichen simplex chronicus 04/08/2015  . Osteopenia 04/08/2015  . Depression 03/05/2015  . ALLERGIC RHINITIS, SEASONAL 02/03/2008  . IRRITABLE BOWEL SYNDROME 02/03/2008    Past Medical History:  Diagnosis Date  . Allergic rhinitis due to pollen   . Irritable bowel syndrome   . 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 ABDOMINAL HYSTERECTOMY  1988   secondary to prolapse  . BLADDER SUSPENSION     A-P with Hyst  . NASAL SINUS SURGERY  1970  . TONSILLECTOMY      MEDS:   Current Outpatient Medications on File Prior to Visit  Medication Sig Dispense Refill  . buPROPion (WELLBUTRIN) 75 MG tablet Take 1 tablet (75 mg total) by mouth 2 (two) times daily. 180 tablet 3  . calcium carbonate (OS-CAL) 600 MG TABS Take 600 mg by mouth 2 (two) times daily with a meal.      . conjugated estrogens (PREMARIN) vaginal cream Use 1/2 g vaginally every other night for the next 2 weeks, then 2 x a week at hs 30 g 1  . lisinopril (PRINIVIL,ZESTRIL) 10 MG tablet Take 1 tablet (10 mg total) by mouth daily. 90 tablet 0  . Multiple Vitamin (MULTIVITAMIN) tablet Take 1 tablet by mouth daily.      . Pyridoxine HCl (B-6 PO) Take 1 tablet by mouth daily.    Marland Kitchen saccharomyces  boulardii (FLORASTOR) 250 MG capsule Take 2 capsules (500 mg total) by mouth 2 (two) times daily. 120 capsule 3  . triamcinolone (NASACORT) 55 MCG/ACT AERO nasal inhaler Place 2 sprays into the nose daily.    . vitamin C (ASCORBIC ACID) 500 MG tablet Take 500 mg by mouth daily.    . Vitamin D, Ergocalciferol, (DRISDOL) 50000 units CAPS capsule Take 1 capsule (50,000 Units total) by mouth every 30 (thirty) days. 1 capsule 3  . triamcinolone ointment (KENALOG) 0.5 % Apply BID for up to 7 days with flare. (Patient not taking: Reported on 07/08/2018) 15 g 12   No current facility-administered medications on file prior to visit.     ALLERGIES: Codeine and Lexapro [escitalopram]  Family History  Problem Relation Age of Onset  . Emphysema Mother   . Lymphoma Mother   . Asthma Mother   . Cancer Father 7       lung cancer  . Coronary artery disease Brother   . Cancer Brother        breast cancer  . Cancer Brother        lung cancer  . Diabetes Neg Hx   . Hypertension Neg Hx   . Colon cancer Neg Hx     SH:  Married, non smoker  Review of Systems  All other systems reviewed and are negative.   PHYSICAL EXAMINATION:  BP 122/80 (BP Location: Right Arm, Patient Position: Sitting, Cuff Size: Normal)   Pulse 84   Resp 16   Ht 5' (1.524 m)   Wt 149 lb 3.2 oz (67.7 kg)   LMP 12/25/1986 (Approximate)   BMI 29.14 kg/m     General appearance: alert, cooperative and appears stated age Lymph:  No inguinal LAD  Pelvic: External genitalia:  no lesions              Urethra:  normal appearing urethra with no masses, tenderness or lesions              Bartholins and Skenes: normal                 Vagina: significant excoriation and granulation tissue in right apex and then on left mid way in vagina, estrogen cream applied to both              Cervix: absent              Bimanual Exam:  Uterus:  uterus absent              Adnexa: no mass, fullness, tenderness  Chaperone was present for  exam.  Assessment: Vaginal excoriations and granulation tissue from pessary  Plan: Pt is advised she must leave out pessary and recommended using vaginal estrogen cream every day.  Does not need rx Recheck one week Has appt with Dr. Matilde Sprang in early August   ~15 minutes spent with patient >50% of time was in face to face discussion of above.

## 2018-07-15 ENCOUNTER — Ambulatory Visit (INDEPENDENT_AMBULATORY_CARE_PROVIDER_SITE_OTHER): Payer: Medicare Other | Admitting: Obstetrics & Gynecology

## 2018-07-15 ENCOUNTER — Encounter: Payer: Self-pay | Admitting: Obstetrics & Gynecology

## 2018-07-15 VITALS — BP 136/80 | HR 76 | Resp 16 | Ht 60.0 in | Wt 145.2 lb

## 2018-07-15 DIAGNOSIS — N952 Postmenopausal atrophic vaginitis: Secondary | ICD-10-CM | POA: Diagnosis not present

## 2018-07-15 DIAGNOSIS — N819 Female genital prolapse, unspecified: Secondary | ICD-10-CM | POA: Diagnosis not present

## 2018-07-15 NOTE — Progress Notes (Signed)
GYNECOLOGY  VISIT  CC:   Recheck vaginal bleeding, pessary  HPI: 77 y.o. G64P2002 [name] here for pessary f/u.  She did leave the pessary out this past week.  Bleeding has stopped.  She is using a small amount of vaginal estrogen cream.  Applying the cream daily has actually helped with her prolapse symptoms because the prolapse is being reduced daily.  She does have her appt with Dr. Matilde Werner in about two weeks.  She has pretty much decided if Dr. Matilde Werner will do surgery, she is ready to proceed.  She is really tired of the bleeding issues due to the pessary and resulting vaginal ulcerations.    GYNECOLOGIC HISTORY: Patient's last menstrual period was 12/25/1986 (approximate). Contraception: post menopausal  Menopausal hormone therapy: premarin vag cream   Patient Active Problem List   Diagnosis Date Noted  . Essential hypertension 06/03/2018  . Seasonal affective disorder ([place]) 01/17/2017  . Lichen simplex chronicus 04/08/2015  . Osteopenia 04/08/2015  . Depression 03/05/2015  . ALLERGIC RHINITIS, SEASONAL 02/03/2008  . IRRITABLE BOWEL SYNDROME 02/03/2008    Past Medical History:  Diagnosis Date  . Allergic rhinitis due to pollen   . Irritable bowel syndrome   . 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 ABDOMINAL HYSTERECTOMY  1988   secondary to prolapse  . BLADDER SUSPENSION     A-P with Hyst  . NASAL SINUS SURGERY  1970  . TONSILLECTOMY      MEDS:   Current Outpatient Medications on File Prior to Visit  Medication Sig Dispense Refill  . buPROPion (WELLBUTRIN) 75 MG tablet Take 1 tablet (75 mg total) by mouth 2 (two) times daily. 180 tablet 3  . calcium carbonate (OS-CAL) 600 MG TABS Take 600 mg by mouth 2 (two) times daily with a meal.      . conjugated estrogens (PREMARIN) vaginal cream Use 1/2 g vaginally every other night for the next 2 weeks, then 2 x a week at hs 30 g 1  . lisinopril  (PRINIVIL,ZESTRIL) 10 MG tablet Take 1 tablet (10 mg total) by mouth daily. 90 tablet 0  . Multiple Vitamin (MULTIVITAMIN) tablet Take 1 tablet by mouth daily.      . Pyridoxine HCl (B-6 PO) Take 1 tablet by mouth daily.    Marland Kitchen saccharomyces boulardii (FLORASTOR) 250 MG capsule Take 2 capsules (500 mg total) by mouth 2 (two) times daily. 120 capsule 3  . triamcinolone (NASACORT) 55 MCG/ACT AERO nasal inhaler Place 2 sprays into the nose daily.    . vitamin C (ASCORBIC ACID) 500 MG tablet Take 500 mg by mouth daily.    . Vitamin D, Ergocalciferol, (DRISDOL) 50000 units CAPS capsule Take 1 capsule (50,000 Units total) by mouth every 30 (thirty) days. 1 capsule 3   No current facility-administered medications on file prior to visit.     ALLERGIES: Codeine and Lexapro [escitalopram]  Family History  Problem Relation Age of Onset  . Emphysema Mother   . Lymphoma Mother   . Asthma Mother   . Cancer Father 6       lung cancer  . Coronary artery disease Brother   . Cancer Brother        breast cancer  . Cancer Brother        lung cancer  . Diabetes Neg Hx   . Hypertension Neg Hx   . Colon cancer Neg Hx     SH:  Married,  non smoker  Review of Systems  All other systems reviewed and are negative.   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  No bleeding noted and no bleeding occurred with manipulation of the speculum.   Now appears as denuded epithelium without any granulation tissue              Cervix: absent              Bimanual Exam:  Uterus:  uterus absent              Adnexa: no mass, fullness, tenderness  Assessment: Vaginal vault prolapse Much improved vaginal ulcerations/granulation tissue  Plan: Continue using vaginal estrogen cream. Keep appt with Dr. Matilde Werner Highly encouraged pt to not use pessary   ~15 minutes spent with patient >50% of time was in face to face discussion of above.

## 2018-07-29 ENCOUNTER — Telehealth: Payer: Self-pay

## 2018-07-29 NOTE — Telephone Encounter (Signed)
Copied from [place] [id number]. Topic: General - Other >> Jul 29, 2018  7:56 AM Alfredia Ferguson R wrote: Pt is calling in wanting to know if she can stop lisinopril (PRINIVIL,ZESTRIL) 10 MG tablet due to cough that developed when she started taking it , if she cant stop it can something be called in for the cough ?  CB# [phone]

## 2018-07-29 NOTE — Telephone Encounter (Signed)
She can stop and we can try Amlodipine 5 mg. Send in #30, 0 refills. I need to check her BP in 2 weeks.

## 2018-07-30 DIAGNOSIS — R350 Frequency of micturition: Secondary | ICD-10-CM | POA: Diagnosis not present

## 2018-07-30 DIAGNOSIS — N8111 Cystocele, midline: Secondary | ICD-10-CM | POA: Diagnosis not present

## 2018-07-30 NOTE — Telephone Encounter (Signed)
Pt is okay with trying Amlodipine and will send to [facility] ... Pt already has AWV scheduled 09/02/2018 per Baity ok to f/u then pt is aware possible side effect edema and will let us know if she has concerns

## 2018-07-31 MED ORDER — AMLODIPINE BESYLATE 5 MG PO TABS: 5.0000 mg | ORAL_TABLET | Freq: Every day | ORAL | 0 refills | Status: DC

## 2018-07-31 NOTE — Addendum Note (Signed)
Addended by: Helene Shoe on: 07/31/2018 01:18 PM   Modules accepted: Orders

## 2018-07-31 NOTE — Telephone Encounter (Signed)
I sent amlodipine 5 mg # 30 x 0 to [facility] in [place]. I spoke with  Deidra at [facility] and she did receive the amlodipine rx and will deactivate the lisinopril 10 mg. Pt notified and voiced understanding.

## 2018-07-31 NOTE — Telephone Encounter (Signed)
Patient still waiting amlodipine to be sent to [facility]. She's at the pharmacy and would like a call back once sent.

## 2018-08-05 DIAGNOSIS — M9901 Segmental and somatic dysfunction of cervical region: Secondary | ICD-10-CM | POA: Diagnosis not present

## 2018-08-05 DIAGNOSIS — M9903 Segmental and somatic dysfunction of lumbar region: Secondary | ICD-10-CM | POA: Diagnosis not present

## 2018-08-05 DIAGNOSIS — M9904 Segmental and somatic dysfunction of sacral region: Secondary | ICD-10-CM | POA: Diagnosis not present

## 2018-08-05 DIAGNOSIS — M545 Low back pain: Secondary | ICD-10-CM | POA: Diagnosis not present

## 2018-08-13 DIAGNOSIS — H04123 Dry eye syndrome of bilateral lacrimal glands: Secondary | ICD-10-CM | POA: Diagnosis not present

## 2018-08-13 DIAGNOSIS — H25813 Combined forms of age-related cataract, bilateral: Secondary | ICD-10-CM | POA: Diagnosis not present

## 2018-08-19 ENCOUNTER — Telehealth: Payer: Self-pay | Admitting: Internal Medicine

## 2018-08-19 NOTE — Telephone Encounter (Signed)
Gloria Werner  Can you help pt get schedule with dr Deborra Medina Thanks

## 2018-08-19 NOTE — Telephone Encounter (Signed)
Okay with me 

## 2018-08-19 NOTE — Telephone Encounter (Signed)
Fine with me. Very sweet lady.

## 2018-08-19 NOTE — Telephone Encounter (Signed)
See below crm Ok to transfer?  Copied from [place] [id number]. Topic: Appointment Scheduling - Prior Auth Required for Appointment >> Aug 19, 2018 11:46 AM Robina Ade, Helene Kelp D wrote: No appointment has been scheduled. Patient is requesting a transfer appointment due to closer to home. Per scheduling protocol, this appointment requires a prior authorization prior to scheduling.  Route to department's PEC pool.  Pt seens Webb Silversmith at [facility] and would like to see Dr. Deborra Medina at [facility].

## 2018-08-20 NOTE — Telephone Encounter (Signed)
Pt is scheduled for Thursday morning/she has had a cough for some time/thx dmf

## 2018-08-22 ENCOUNTER — Encounter: Payer: Self-pay | Admitting: Family Medicine

## 2018-08-22 ENCOUNTER — Ambulatory Visit (INDEPENDENT_AMBULATORY_CARE_PROVIDER_SITE_OTHER): Payer: Medicare Other | Admitting: Family Medicine

## 2018-08-22 ENCOUNTER — Ambulatory Visit (INDEPENDENT_AMBULATORY_CARE_PROVIDER_SITE_OTHER): Payer: Medicare Other

## 2018-08-22 VITALS — BP 176/96 | HR 83 | Temp 98.1°F | Ht 60.25 in | Wt 148.0 lb

## 2018-08-22 DIAGNOSIS — R05 Cough: Secondary | ICD-10-CM

## 2018-08-22 DIAGNOSIS — N814 Uterovaginal prolapse, unspecified: Secondary | ICD-10-CM | POA: Insufficient documentation

## 2018-08-22 DIAGNOSIS — I10 Essential (primary) hypertension: Secondary | ICD-10-CM

## 2018-08-22 DIAGNOSIS — R600 Localized edema: Secondary | ICD-10-CM | POA: Diagnosis not present

## 2018-08-22 DIAGNOSIS — R059 Cough, unspecified: Secondary | ICD-10-CM | POA: Insufficient documentation

## 2018-08-22 DIAGNOSIS — M722 Plantar fascial fibromatosis: Secondary | ICD-10-CM

## 2018-08-22 MED ORDER — LISINOPRIL 10 MG PO TABS: 10.0000 mg | ORAL_TABLET | Freq: Every day | ORAL | 3 refills | Status: DC

## 2018-08-22 MED ORDER — OMEPRAZOLE 20 MG PO CPDR: 20.0000 mg | DELAYED_RELEASE_CAPSULE | Freq: Every day | ORAL | 3 refills | Status: DC

## 2018-08-22 NOTE — Assessment & Plan Note (Signed)
Persistent and does not seem to be caused by the ACEI. Will order CXR today for patient reassurance. Continue claritin as PND may be a component and add PPI- omeprazole 20 mg daily to rule out GERD component (eRx sent).  Follow up in 2 weeks. The patient indicates understanding of these issues and agrees with the plan.

## 2018-08-22 NOTE — Assessment & Plan Note (Signed)
Followed by GYN. Planning on having surgery. Discussed importance of getting her blood pressure under control first. The patient indicates understanding of these issues and agrees with the plan.

## 2018-08-22 NOTE — Assessment & Plan Note (Signed)
Poorly controlled without rx. Discussed tx options. She would feel comfortable restarting lisinopril.  eRx sent for lisinopril 10 mg daily. Follow up in 2 weeks.

## 2018-08-22 NOTE — Patient Instructions (Addendum)
Great to meet you. We are starting Lisinopril 10 mg daily. Come see me in 2 weeks.  We are starting omeprazole 20 mg daily.  Please bring your home blood pressure monitor to next visit :)

## 2018-08-22 NOTE — Assessment & Plan Note (Signed)
Followed by podiatry. No changes made.

## 2018-08-22 NOTE — Progress Notes (Signed)
Subjective:   Patient ID: Gloria Werner, female    DOB: July 01, 1941, 77 y.o.   MRN: [id number]  Gloria Werner is a pleasant 77 y.o. year old female who presents to clinic today with Transfer of Care (Patient is here today to transfer care from Gloria Heater, NP to Dr. Deborra Medina.  She is C/O a cough that has been going on x39months now.  Cough presents with a tickle then she coughs and brings up clear sputum then other times it is not productive.  She states that in May she was started on a low-dose Lisinopril and 3-4d after the cough developed.  She called and Webb Silversmith, NP changed it to Amlodipine 5mg  and she continued with the cough.  She quit taking this about 1.5 weeks ago but cough persists.  She ) and addendum (was advised to watch out for BLE edema as a SE from Amlodipine.  She is experiencing LLE edema since start date of Amlodipine.  Sometimes there is erythema.  She does have pain in foot due to plantar faccitis and went to Podiatrist and they gave her a shot and the pain went away then has come back but not as bad.  Has been doing exercises.  Cough has to be gone before she can have prolapse fixed by GYN.  Would like a CXR.)  on 08/22/2018  HPI:  Patient is new to me. Chart reviewed in detail.  Cough- she has had an ongoing intermittent cough for 3 months now. Cough started when she started Lisinopril in 04/2018.   It starts with a tickle and then it can become a dry cough or sometimes it is productive of clear sputum. Called previous PCP 3-4 days after coughed developed and was advised to stop taking lisinopril and started on amlodipine 5 mg daily but cough continues despite stopping the ACEI.  She is asking for CXR.  Dad had lung CA.  She grew up around smoke.  Denies hemoptysis. Does not noticed if it occurs more in the morning or at night. She denies any symptoms of gastric reflux that she is aware of.  She does drink a lot of coffee.  She is taking Claritin daily which has  helped slightly but still coughing.  HTN- stopped taking amlodipine almost 2 weeks ago.Swelling did not improve when she stopped the amlodipine.  BP is high today.   She was advised to watch for LE edema with amlodipine and has noticed only left sided pedal edema since starting it.  Sometimes with erythema.  She does have pain in that foot but she also has plantar fascitis- followed by podiatry for this.  Has received cortisone injections.     Current Outpatient Medications on File Prior to Visit  Medication Sig Dispense Refill  . buPROPion (WELLBUTRIN) 75 MG tablet Take 1 tablet (75 mg total) by mouth 2 (two) times daily. 180 tablet 3  . calcium carbonate (OS-CAL) 600 MG TABS Take 600 mg by mouth 2 (two) times daily with a meal.      . conjugated estrogens (PREMARIN) vaginal cream Use 1/2 g vaginally every other night for the next 2 weeks, then 2 x a week at hs 30 g 1  . loratadine (CLARITIN) 10 MG tablet Take 10 mg by mouth daily.    . Multiple Vitamin (MULTIVITAMIN) tablet Take 1 tablet by mouth daily.      . Pyridoxine HCl (B-6 PO) Take 1 tablet by mouth daily.    Marland Kitchen saccharomyces boulardii (  FLORASTOR) 250 MG capsule Take 2 capsules (500 mg total) by mouth 2 (two) times daily. 120 capsule 3  . triamcinolone (NASACORT) 55 MCG/ACT AERO nasal inhaler Place 2 sprays into the nose daily.    . vitamin C (ASCORBIC ACID) 500 MG tablet Take 500 mg by mouth daily.    . Vitamin D, Ergocalciferol, (DRISDOL) 50000 units CAPS capsule Take 1 capsule (50,000 Units total) by mouth every 30 (thirty) days. 1 capsule 3   No current facility-administered medications on file prior to visit.     Allergies  Allergen Reactions  . Codeine Nausea And Vomiting  . Lexapro [Escitalopram] Diarrhea    headache    Past Medical History:  Diagnosis Date  . Allergic rhinitis due to pollen   . Irritable bowel syndrome   . 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  ABDOMINAL HYSTERECTOMY  1988   secondary to prolapse  . BLADDER SUSPENSION     A-P with Hyst  . NASAL SINUS SURGERY  1970  . TONSILLECTOMY      Family History  Problem Relation Age of Onset  . Emphysema Mother   . Lymphoma Mother   . Asthma Mother   . Cancer Father 61       lung cancer  . Coronary artery disease Brother   . Cancer Brother        breast cancer  . Cancer Brother        lung cancer  . Diabetes Neg Hx   . Hypertension Neg Hx   . Colon cancer Neg Hx     Social History   Socioeconomic History  . Marital status: Married    Spouse name: Not on file  . Number of children: 2  . Years of education: 58  . Highest education level: Not on file  Occupational History  . Occupation: Environmental health practitioner  Social Needs  . Financial resource strain: Not on file  . Food insecurity:    Worry: Not on file    Inability: Not on file  . Transportation needs:    Medical: Not on file    Non-medical: Not on file  Tobacco Use  . Smoking status: Never Smoker  . Smokeless tobacco: Never Used  Substance and Sexual Activity  . Alcohol use: No  . Drug use: No  . Sexual activity: Yes    Partners: Male    Birth control/protection: Surgical  Lifestyle  . Physical activity:    Days per week: Not on file    Minutes per session: Not on file  . Stress: Not on file  Relationships  . Social connections:    Talks on phone: Not on file    Gets together: Not on file    Attends religious service: Not on file    Active member of club or organization: Not on file    Attends meetings of clubs or organizations: Not on file    Relationship status: Not on file  . Intimate partner violence:    Fear of current or ex partner: Not on file    Emotionally abused: Not on file    Physically abused: Not on file    Forced sexual activity: Not on file  Other Topics Concern  . Not on file  Social History Narrative   HSG. Married '63. 1 son- 57'; 1-daughter-'68; grandchildren 3. 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 Marriage in good health. ACP - does  not want to be kept alive in persistent vegative state. Provided lead to [email or web address].   The PMH, PSH, Social History, Family History, Medications, and allergies have been reviewed in [facility], and have been updated if relevant.   Review of Systems  Constitutional: Negative.   HENT: Negative for dental problem, drooling, hearing loss, mouth sores, nosebleeds and postnasal drip.   Respiratory: Positive for cough. Negative for apnea, choking, chest tightness, shortness of breath, wheezing and stridor.   Cardiovascular: Negative.   Gastrointestinal: Negative.   Endocrine: Negative.   Genitourinary: Positive for frequency and urgency.  Musculoskeletal: Negative.   Allergic/Immunologic: Negative.   Neurological: Negative.   Hematological: Negative.   Psychiatric/Behavioral: Negative.   All other systems reviewed and are negative.      Objective:    BP (!) 176/96 (BP Location: Left Arm, Cuff Size: Normal)   Pulse 83   Temp 98.1 F (36.7 C) (Oral)   Ht 5' 0.25" (1.53 m)   Wt 148 lb (67.1 kg)   LMP 12/25/1986 (Approximate)   SpO2 99%   BMI 28.66 kg/m    Physical Exam  Constitutional: She is oriented to person, place, and time. She appears well-developed and well-nourished. No distress.  HENT:  Head: Normocephalic and atraumatic.  Eyes: EOM are normal.  Neck: Normal range of motion.  Cardiovascular: Normal rate and regular rhythm.  Pulmonary/Chest: Effort normal and breath sounds normal.  Abdominal: Soft. She exhibits no distension.  Musculoskeletal: Normal range of motion.  Left pedal edema, no edema right  Neurological: She is alert and oriented to person, place, and time. No cranial nerve deficit. Coordination normal.  Skin: Skin is warm and dry. She is not diaphoretic.  Psychiatric: She has a normal mood and affect. Her behavior is normal. Judgment and thought  content normal.  Nursing note and vitals reviewed.         Assessment & Plan:   Essential hypertension  Cough - Plan: DG Chest 2 View, DG Chest 2 View  Plantar fascial fibromatosis of left foot  Pedal edema No follow-ups on file.

## 2018-08-22 NOTE — Assessment & Plan Note (Signed)
Left sided only,unlikely due to norvasc. She has already stopped norvasc (will remove from med list). Likely due to plantar fascitis. Continue compression sock, keep foot elevated.

## 2018-09-02 ENCOUNTER — Encounter: Payer: Self-pay | Admitting: Internal Medicine

## 2018-09-04 ENCOUNTER — Other Ambulatory Visit: Payer: Self-pay | Admitting: Internal Medicine

## 2018-09-04 ENCOUNTER — Ambulatory Visit (INDEPENDENT_AMBULATORY_CARE_PROVIDER_SITE_OTHER): Payer: Medicare Other | Admitting: Family Medicine

## 2018-09-04 ENCOUNTER — Encounter: Payer: Self-pay | Admitting: Family Medicine

## 2018-09-04 VITALS — BP 156/93 | HR 80 | Temp 98.8°F | Ht 60.25 in | Wt 147.2 lb

## 2018-09-04 DIAGNOSIS — R05 Cough: Secondary | ICD-10-CM

## 2018-09-04 DIAGNOSIS — K219 Gastro-esophageal reflux disease without esophagitis: Secondary | ICD-10-CM | POA: Diagnosis not present

## 2018-09-04 DIAGNOSIS — Z23 Encounter for immunization: Secondary | ICD-10-CM | POA: Diagnosis not present

## 2018-09-04 DIAGNOSIS — Z1211 Encounter for screening for malignant neoplasm of colon: Secondary | ICD-10-CM

## 2018-09-04 DIAGNOSIS — J301 Allergic rhinitis due to pollen: Secondary | ICD-10-CM

## 2018-09-04 DIAGNOSIS — I10 Essential (primary) hypertension: Secondary | ICD-10-CM

## 2018-09-04 DIAGNOSIS — R059 Cough, unspecified: Secondary | ICD-10-CM

## 2018-09-04 LAB — CBC WITH DIFFERENTIAL/PLATELET
BASOS ABS: 0 10*3/uL (ref 0.0–0.1)
BASOS PCT: 0.8 % (ref 0.0–3.0)
EOS ABS: 0.2 10*3/uL (ref 0.0–0.7)
Eosinophils Relative: 4.2 % (ref 0.0–5.0)
HCT: 40.2 % (ref 36.0–46.0)
HEMOGLOBIN: 13.4 g/dL (ref 12.0–15.0)
LYMPHS PCT: 31 % (ref 12.0–46.0)
Lymphs Abs: 1.4 10*3/uL (ref 0.7–4.0)
MCHC: 33.3 g/dL (ref 30.0–36.0)
MCV: 89.4 fl (ref 78.0–100.0)
MONO ABS: 0.4 10*3/uL (ref 0.1–1.0)
Monocytes Relative: 7.7 % (ref 3.0–12.0)
Neutro Abs: 2.6 10*3/uL (ref 1.4–7.7)
Neutrophils Relative %: 56.3 % (ref 43.0–77.0)
Platelets: 204 10*3/uL (ref 150.0–400.0)
RBC: 4.5 Mil/uL (ref 3.87–5.11)
RDW: 13.3 % (ref 11.5–15.5)
WBC: 4.7 10*3/uL (ref 4.0–10.5)

## 2018-09-04 LAB — LIPID PANEL
Cholesterol: 181 mg/dL (ref 0–200)
HDL: 52.4 mg/dL (ref >39.00)
LDL Cholesterol: 115 mg/dL — ABNORMAL HIGH (ref 0–99)
NONHDL: 128.45
TRIGLYCERIDES: 69 mg/dL (ref 0.0–149.0)
Total CHOL/HDL Ratio: 3
VLDL: 13.8 mg/dL (ref 0.0–40.0)

## 2018-09-04 LAB — TSH: TSH: 2.02 u[IU]/mL (ref 0.35–4.50)

## 2018-09-04 LAB — COMPREHENSIVE METABOLIC PANEL
ALBUMIN: 4.2 g/dL (ref 3.5–5.2)
ALK PHOS: 47 U/L (ref 39–117)
ALT: 16 U/L (ref 0–35)
AST: 23 U/L (ref 0–37)
BUN: 20 mg/dL (ref 6–23)
CHLORIDE: 105 meq/L (ref 96–112)
CO2: 28 mEq/L (ref 19–32)
Calcium: 9.4 mg/dL (ref 8.4–10.5)
Creatinine, Ser: 0.79 mg/dL (ref 0.40–1.20)
GFR: 75.46 mL/min (ref >60.00)
GLUCOSE: 88 mg/dL (ref 70–99)
Potassium: 4.1 mEq/L (ref 3.5–5.1)
SODIUM: 140 meq/L (ref 135–145)
Total Bilirubin: 0.4 mg/dL (ref 0.2–1.2)
Total Protein: 7 g/dL (ref 6.0–8.3)

## 2018-09-04 MED ORDER — LISINOPRIL 20 MG PO TABS: 20.0000 mg | ORAL_TABLET | Freq: Every day | ORAL | 3 refills | Status: DC

## 2018-09-04 MED ORDER — RANITIDINE HCL 150 MG PO TABS: 150.0000 mg | ORAL_TABLET | Freq: Two times a day (BID) | ORAL | 3 refills | Status: DC

## 2018-09-04 MED ORDER — LEVOCETIRIZINE DIHYDROCHLORIDE 5 MG PO TABS: 5.0000 mg | ORAL_TABLET | Freq: Every evening | ORAL | 3 refills | Status: DC

## 2018-09-04 NOTE — Patient Instructions (Addendum)
Great to see you. I will call you with your lab results from today and you can view them online.   We are increasing your lisinopril to 20 mg daily ( okay to take two of your 10 mg tablets daily until you run out)- I have sent a new prescription to your pharmacy.  We are starting  Zantac twice daily.  Stop zyrtec.  Start xyzal at bedtime.  Please come see me in 2 weeks for a complete physical.

## 2018-09-04 NOTE — Assessment & Plan Note (Signed)
Improved but not at goal-  Increase lisinopril to 20 mg daily. Follow up in 2 weeks. Orders Placed This Encounter  Procedures  . Flu Vaccine QUAD 6+ mos PF IM (Fluarix Quad PF)  . CBC with Differential/Platelet  . Comprehensive metabolic panel  . Lipid panel  . TSH

## 2018-09-04 NOTE — Assessment & Plan Note (Signed)
She is concerned about taking PPI as there is a slight increase in C diff associated with PPIs. Will d/c omeprazole. She is willing to start H2 blocker- eRx sent for zantac. Follow up in 2 weeks at [facility].

## 2018-09-04 NOTE — Assessment & Plan Note (Signed)
Persistent. Stop zyrtec. eRx sent for xyzal.

## 2018-09-04 NOTE — Progress Notes (Signed)
Subjective:   Patient ID: Gloria Werner, female    DOB: [birth year], 77 y.o.   MRN: [id number]  Gloria Werner is a pleasant 77 y.o. year old female who presents to clinic today with Follow-up (Patient is here today for a 2-week-F/U.  On 8.29.19 HTN was poorly controlled therefore Lisinopril 10mg  was restarted.  Also added PPI Omeprazole 20mg  to R/O GERD and advised to RTN in 2 weeks.  She is currently fasting.  Agrees to get flu shot.  She is now due for another Colonoscopy and will need to go to [facility].  Pt states that she has been compliant with Lisinopril but not the Omeprazole as upon reading a SE is C-Diff which she states she is prone to getting.)  on 09/04/2018  HPI:  Here for follow up.  She established care with me on 08/22/18. Note reviewed.  Cough- She had a persistent cough for months, despite stopping ACEI.  She requested a CXR for reassurance since dad had lung cancer and she grew up around smoke.  Denied hemoptysis. She denied any symptoms of GERD but did admit to drinking a lot of coffee. Had recently started claritin daily which did help some with cough but it was still persisting.  She did not start Omeprazole because she read that it can cause C diff which she is already prone to. Cough is only mildly better.  Dg Chest 2 View  Result Date: 08/22/2018 CLINICAL DATA:  77 year old female with a history of cough EXAM: CHEST - 2 VIEW COMPARISON:  None. FINDINGS: The heart size and mediastinal contours are within normal limits. Both lungs are clear. The visualized skeletal structures are unremarkable. IMPRESSION: No active cardiopulmonary disease. Electronically Signed   By: Corrie Mckusick D.O.   On: 08/22/2018 16:27     HTN- Since cough persisted months after stopping ACEI and starting Norvasc, we restarted lisinopril 10 mg daily.  She had stopped norvasc two weeks prior due to LE edema. Blood pressure remains elevated but improved. BP Readings from Last 3  Encounters:  09/04/18 (!) 156/93  08/22/18 (!) 176/96  07/15/18 136/80    Current Outpatient Medications on File Prior to Visit  Medication Sig Dispense Refill  . buPROPion (WELLBUTRIN) 75 MG tablet Take 1 tablet (75 mg total) by mouth 2 (two) times daily. 180 tablet 3  . calcium carbonate (OS-CAL) 600 MG TABS Take 600 mg by mouth 2 (two) times daily with a meal.      . conjugated estrogens (PREMARIN) vaginal cream Use 1/2 g vaginally every other night for the next 2 weeks, then 2 x a week at hs 30 g 1  . loratadine (CLARITIN) 10 MG tablet Take 10 mg by mouth daily.    . Multiple Vitamin (MULTIVITAMIN) tablet Take 1 tablet by mouth daily.      . Pyridoxine HCl (B-6 PO) Take 1 tablet by mouth daily.    Marland Kitchen saccharomyces boulardii (FLORASTOR) 250 MG capsule Take 2 capsules (500 mg total) by mouth 2 (two) times daily. 120 capsule 3  . triamcinolone (NASACORT) 55 MCG/ACT AERO nasal inhaler Place 2 sprays into the nose daily.    . vitamin C (ASCORBIC ACID) 500 MG tablet Take 500 mg by mouth daily.    . Vitamin D, Ergocalciferol, (DRISDOL) 50000 units CAPS capsule Take 1 capsule (50,000 Units total) by mouth every 30 (thirty) days. 1 capsule 3  . omeprazole (PRILOSEC) 20 MG capsule Take 1 capsule (20 mg total) by mouth daily. (  Patient not taking: Reported on 09/04/2018) 30 capsule 3   No current facility-administered medications on file prior to visit.     Allergies  Allergen Reactions  . Codeine Nausea And Vomiting  . Lexapro [Escitalopram] Diarrhea    headache    Past Medical History:  Diagnosis Date  . Allergic rhinitis due to pollen   . Irritable bowel syndrome   . 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 ABDOMINAL HYSTERECTOMY  1988   secondary to prolapse  . BLADDER SUSPENSION     A-P with Hyst  . NASAL SINUS SURGERY  1970  . TONSILLECTOMY      Family History  Problem Relation Age of Onset  . Emphysema Mother   . Lymphoma  Mother   . Asthma Mother   . Cancer Father 75       lung cancer  . Coronary artery disease Brother   . Cancer Brother        breast cancer  . Cancer Brother        lung cancer  . Diabetes Neg Hx   . Hypertension Neg Hx   . Colon cancer Neg Hx     Social History   Socioeconomic History  . Marital status: Married    Spouse name: Not on file  . Number of children: 2  . Years of education: 84  . Highest education level: Not on file  Occupational History  . Occupation: Environmental health practitioner  Social Needs  . Financial resource strain: Not on file  . Food insecurity:    Worry: Not on file    Inability: Not on file  . Transportation needs:    Medical: Not on file    Non-medical: Not on file  Tobacco Use  . Smoking status: Never Smoker  . Smokeless tobacco: Never Used  Substance and Sexual Activity  . Alcohol use: No  . Drug use: No  . Sexual activity: Yes    Partners: Male    Birth control/protection: Surgical  Lifestyle  . Physical activity:    Days per week: Not on file    Minutes per session: Not on file  . Stress: Not on file  Relationships  . Social connections:    Talks on phone: Not on file    Gets together: Not on file    Attends religious service: Not on file    Active member of club or organization: Not on file    Attends meetings of clubs or organizations: Not on file    Relationship status: Not on file  . Intimate partner violence:    Fear of current or ex partner: Not on file    Emotionally abused: Not on file    Physically abused: Not on file    Forced sexual activity: Not on file  Other Topics Concern  . Not on file  Social History Narrative   HSG. Married '63. 1 son- 71'; 1-daughter-'68; grandchildren 3. 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 Marriage in good health. 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 Provided lead to [email or web address].   The PMH, PSH, Social History,  Family History, Medications, and allergies have been reviewed in [facility], and have been updated if relevant.    Review of Systems  Constitutional: Negative.   HENT: Positive for postnasal drip.   Respiratory: Positive for cough. Negative for shortness of breath and wheezing.   Cardiovascular: Negative.  Gastrointestinal: Negative.   Endocrine: Negative.   Genitourinary: Negative.   Musculoskeletal: Negative.   Neurological: Negative.   Hematological: Negative.   Psychiatric/Behavioral: Negative.   All other systems reviewed and are negative.      Objective:    BP (!) 156/93 (BP Location: Left Arm, Cuff Size: Normal)   Pulse 80   Temp 98.8 F (37.1 C) (Oral)   Ht 5' 0.25" (1.53 m)   Wt 147 lb 3.2 oz (66.8 kg)   LMP 12/25/1986 (Approximate)   SpO2 98%   BMI 28.51 kg/m    Physical Exam  Constitutional: She is oriented to person, place, and time. She appears well-developed and well-nourished. No distress.  HENT:  Head: Normocephalic and atraumatic.  +PND  Eyes: EOM are normal.  Neck: Normal range of motion.  Cardiovascular: Normal rate and regular rhythm.  Pulmonary/Chest: Effort normal and breath sounds normal.  Musculoskeletal: Normal range of motion.  Neurological: She is alert and oriented to person, place, and time. No cranial nerve deficit.  Skin: Skin is warm and dry. She is not diaphoretic.  Psychiatric: She has a normal mood and affect. Her behavior is normal. Judgment and thought content normal.  Nursing note and vitals reviewed.         Assessment & Plan:   Essential hypertension - Plan: CBC with Differential/Platelet, Comprehensive metabolic panel, Lipid panel, TSH  Gastroesophageal reflux disease, esophagitis presence not specified  Need for influenza vaccination - Plan: Flu Vaccine QUAD 6+ mos PF IM (Fluarix Quad PF) No follow-ups on file.

## 2018-09-04 NOTE — Assessment & Plan Note (Addendum)
Improved but persistent- add trial of PPI. eRx sent for zantac twice daily. Stop zyrtec. Start xyzal at bedtime.  Follow up in 2 weeks at [facility]. The patient indicates understanding of these issues and agrees with the plan.

## 2018-09-05 DIAGNOSIS — R350 Frequency of micturition: Secondary | ICD-10-CM | POA: Diagnosis not present

## 2018-09-05 DIAGNOSIS — N8111 Cystocele, midline: Secondary | ICD-10-CM | POA: Diagnosis not present

## 2018-09-09 ENCOUNTER — Telehealth: Payer: Self-pay | Admitting: Family Medicine

## 2018-09-09 MED ORDER — BENZONATATE 100 MG PO CAPS: 100.0000 mg | ORAL_CAPSULE | Freq: Two times a day (BID) | ORAL | 0 refills | Status: DC | PRN

## 2018-09-09 NOTE — Telephone Encounter (Signed)
Copied from [place] #[id number]. Topic: Quick Communication - See Telephone Encounter >> Sep 09, 2018  7:35 AM Conception Chancy, NT wrote: CRM for notification. See Telephone encounter for: 09/09/18.  Patient is calling and would like for Dr. Deborra Medina to call her in something for her cough, she states she seen Dr. Deborra Medina on 09/04/18 in regards to this. She can not take any cough syrup with Codeine.  [place], [place] - [address] [address] Phone: [phone] Fax: [phone]

## 2018-09-09 NOTE — Telephone Encounter (Signed)
Please advise 

## 2018-09-09 NOTE — Telephone Encounter (Signed)
Thank you. eRx sent.

## 2018-09-09 NOTE — Telephone Encounter (Signed)
We can send in promethazine DM which can make her tired or if she wants something non sedating, I can send in tessalon perles.  Please let me know which she would prefer.

## 2018-09-09 NOTE — Telephone Encounter (Signed)
She prefers tessalon perles.Gloria Werner

## 2018-09-11 ENCOUNTER — Telehealth: Payer: Self-pay | Admitting: Obstetrics & Gynecology

## 2018-09-11 NOTE — Telephone Encounter (Signed)
Erroneous encounter

## 2018-09-12 ENCOUNTER — Other Ambulatory Visit: Payer: Self-pay

## 2018-09-12 ENCOUNTER — Ambulatory Visit: Payer: Medicare Other | Admitting: Obstetrics & Gynecology

## 2018-09-12 ENCOUNTER — Encounter: Payer: Self-pay | Admitting: Obstetrics & Gynecology

## 2018-09-12 VITALS — BP 164/96 | HR 76 | Resp 14 | Ht 60.25 in | Wt 147.4 lb

## 2018-09-12 DIAGNOSIS — R05 Cough: Secondary | ICD-10-CM

## 2018-09-12 DIAGNOSIS — R140 Abdominal distension (gaseous): Secondary | ICD-10-CM

## 2018-09-12 DIAGNOSIS — N898 Other specified noninflammatory disorders of vagina: Secondary | ICD-10-CM

## 2018-09-12 DIAGNOSIS — N760 Acute vaginitis: Secondary | ICD-10-CM | POA: Diagnosis not present

## 2018-09-12 DIAGNOSIS — N939 Abnormal uterine and vaginal bleeding, unspecified: Secondary | ICD-10-CM | POA: Diagnosis not present

## 2018-09-12 DIAGNOSIS — N765 Ulceration of vagina: Secondary | ICD-10-CM | POA: Diagnosis not present

## 2018-09-12 DIAGNOSIS — R053 Chronic cough: Secondary | ICD-10-CM

## 2018-09-12 MED ORDER — METRONIDAZOLE 0.75 % VA GEL: 1.0000 | Freq: Every day | VAGINAL | 0 refills | Status: DC

## 2018-09-12 NOTE — Patient Instructions (Signed)
Pick up oral cont

## 2018-09-12 NOTE — Progress Notes (Signed)
GYNECOLOGY  VISIT  CC:   Vaginal bleeding and odor, frustrations with prolapse, cough  HPI: 77 y.o. G45P2002 Married White or Caucasian female here for continued issues with vaginal vault prolapse.  She has seen Dr. Matilde Sprang and has undergone the evaluation for bladder prolapse.  Did urodynamics.  He advised her he could not repair her prolapse with the continued vaginal ulcerations but has referred her to a partner for sacro-colpopexy consideration.  She is ready to proceed.     She is having issues with chronic cough.  She feels she has some clear drainage.  She just started Zantac.  She's been treated for allergy medications.  She's taken tessalon capsules.  She's done a chest x-ray.  All of this has been done with Dr. Deborra Medina.  None of this has really helped.  Doesn't know what else to do at this point.  Is concerned about change in waist size which has gone up about 4-5 inches.  She has gained weight as well.  Has worked for [employer] and knows what she needs to eat.  Feels she isn't eating that badly at this time.  She does not have nausea.  Does have some issues with diarrhea but this isn't new.  Also feels like she is having vaginal odor that occurred after most recent episode of vaginal bleeding.       GYNECOLOGIC HISTORY: Patient's last menstrual period was 12/25/1986 (approximate). Contraception: hysterectomy  Menopausal hormone therapy: none  Patient Active Problem List   Diagnosis Date Noted  . GERD (gastroesophageal reflux disease) 09/04/2018  . Cough 08/22/2018  . Plantar fascial fibromatosis of left foot 08/22/2018  . Pedal edema 08/22/2018  . Uterine prolapse 08/22/2018  . Essential hypertension 06/03/2018  . Seasonal affective disorder ([place]) 01/17/2017  . Lichen simplex chronicus 04/08/2015  . Osteopenia 04/08/2015  . Depression 03/05/2015  . ALLERGIC RHINITIS, SEASONAL 02/03/2008  . IRRITABLE BOWEL SYNDROME 02/03/2008    Past Medical History:  Diagnosis Date   . Allergic rhinitis due to pollen   . Irritable bowel syndrome   . 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 ABDOMINAL HYSTERECTOMY  1988   secondary to prolapse  . BLADDER SUSPENSION     A-P with Hyst  . NASAL SINUS SURGERY  1970  . TONSILLECTOMY      MEDS:   Current Outpatient Medications on File Prior to Visit  Medication Sig Dispense Refill  . buPROPion (WELLBUTRIN) 75 MG tablet Take 1 tablet (75 mg total) by mouth 2 (two) times daily. 180 tablet 3  . calcium carbonate (OS-CAL) 600 MG TABS Take 600 mg by mouth 2 (two) times daily with a meal.      . conjugated estrogens (PREMARIN) vaginal cream Use 1/2 g vaginally every other night for the next 2 weeks, then 2 x a week at hs 30 g 1  . levocetirizine (XYZAL) 5 MG tablet Take 1 tablet (5 mg total) by mouth every evening. 30 tablet 3  . lisinopril (PRINIVIL,ZESTRIL) 20 MG tablet Take 1 tablet (20 mg total) by mouth daily. 90 tablet 3  . Multiple Vitamin (MULTIVITAMIN) tablet Take 1 tablet by mouth daily.      . Pyridoxine HCl (B-6 PO) Take 1 tablet by mouth daily.    . ranitidine (ZANTAC) 150 MG tablet Take 1 tablet (150 mg total) by mouth 2 (two) times daily. 60 tablet 3  . saccharomyces boulardii (FLORASTOR) 250 MG capsule Take 2 capsules (  500 mg total) by mouth 2 (two) times daily. 120 capsule 3  . triamcinolone (NASACORT) 55 MCG/ACT AERO nasal inhaler Place 2 sprays into the nose daily.    . vitamin C (ASCORBIC ACID) 500 MG tablet Take 500 mg by mouth daily.    . Vitamin D, Ergocalciferol, (DRISDOL) 50000 units CAPS capsule Take 1 capsule (50,000 Units total) by mouth every 30 (thirty) days. 1 capsule 3  . benzonatate (TESSALON) 100 MG capsule Take 1 capsule (100 mg total) by mouth 2 (two) times daily as needed for cough. (Patient not taking: Reported on 09/12/2018) 20 capsule 0   No current facility-administered medications on file prior to visit.     ALLERGIES: Codeine and  Lexapro [escitalopram]  Family History  Problem Relation Age of Onset  . Emphysema Mother   . Lymphoma Mother   . Asthma Mother   . Cancer Father 34       lung cancer  . Coronary artery disease Brother   . Cancer Brother        breast cancer  . Cancer Brother        lung cancer  . Diabetes Neg Hx   . Hypertension Neg Hx   . Colon cancer Neg Hx     SH:  Married, non smoker  Review of Systems  Gastrointestinal: Positive for abdominal distention and abdominal pain.  Genitourinary: Positive for vaginal discharge.  All other systems reviewed and are negative.   PHYSICAL EXAMINATION:    BP (!) 164/96 (BP Location: Right Arm, Patient Position: Sitting, Cuff Size: Large)   Pulse 76   Resp 14   Ht 5' 0.25" (1.53 m)   Wt 147 lb 6.4 oz (66.9 kg)   LMP 12/25/1986 (Approximate)   BMI 28.55 kg/m     General appearance: alert, cooperative and appears stated age Abdomen: soft, non-tender; bowel sounds normal; no masses,  no organomegaly Lymph:  no inguinal LAD noted  Pelvic: External genitalia:  no lesions              Urethra:  normal appearing urethra with no masses, tenderness or lesions              Bartholins and Skenes: normal                 Vagina: large 3rd degree cystocele with significant ulceration on anterior aspect of this prolapse, the left apex is with minimal ulceration and this is improved              Cervix: absent              Bimanual Exam:  Uterus:  uterus absent              Adnexa: no mass, fullness, tenderness  Due to significant ulceration on the anterior aspect of the prolapse (which is new), recommended biopsy of this.  Verbal consent obtained.  Area cleansed with betadine x 3.  Using sterile biopsy forceps, single biopsy was obtained without difficulty.  Silver nitrate was used for excellent hemostasis.  Pt tolerated procedure well.  Chaperone was present for exam.  Assessment: Vaginal ulcerations due to significant prolapse Abdominal girth  increase and weight gain Vaginal odor after most recent episode of vaginal bleeding Chronic cough  Plan: Vulvar biopsy pending.  She will be called with the result. Affirm pending.  Will treat with Metrogel 0.75% nightly x 5 nights. Has appt with urology next week to discussed surgical options.   CT abd/pelvis also  ordered Referral to pulmonology placed as well   ~25 minutes spent with patient >50% of time was in face to face discussion of above.

## 2018-09-13 ENCOUNTER — Ambulatory Visit
Admission: RE | Admit: 2018-09-13 | Discharge: 2018-09-13 | Disposition: A | Discharge status: Home / Self Care | Payer: Medicare Other | Source: Ambulatory Visit | Attending: Obstetrics & Gynecology | Admitting: Obstetrics & Gynecology

## 2018-09-13 DIAGNOSIS — R140 Abdominal distension (gaseous): Secondary | ICD-10-CM

## 2018-09-13 DIAGNOSIS — K5730 Diverticulosis of large intestine without perforation or abscess without bleeding: Secondary | ICD-10-CM | POA: Diagnosis not present

## 2018-09-13 LAB — VAGINITIS/VAGINOSIS, DNA PROBE
CANDIDA SPECIES: NEGATIVE
Gardnerella vaginalis: NEGATIVE
TRICHOMONAS VAG: NEGATIVE

## 2018-09-13 MED ORDER — IOPAMIDOL (ISOVUE-300) INJECTION 61%
100.0000 mL | Freq: Once | INTRAVENOUS | Status: AC | PRN
  Administered 2018-09-13: 100 mL via INTRAVENOUS

## 2018-09-15 ENCOUNTER — Encounter: Payer: Self-pay | Admitting: Obstetrics & Gynecology

## 2018-09-16 DIAGNOSIS — H25813 Combined forms of age-related cataract, bilateral: Secondary | ICD-10-CM | POA: Diagnosis not present

## 2018-09-16 DIAGNOSIS — H04123 Dry eye syndrome of bilateral lacrimal glands: Secondary | ICD-10-CM | POA: Diagnosis not present

## 2018-09-16 DIAGNOSIS — N819 Female genital prolapse, unspecified: Secondary | ICD-10-CM | POA: Diagnosis not present

## 2018-09-17 ENCOUNTER — Other Ambulatory Visit: Payer: Self-pay | Admitting: Urology

## 2018-09-18 ENCOUNTER — Telehealth: Payer: Self-pay | Admitting: Obstetrics & Gynecology

## 2018-09-18 ENCOUNTER — Encounter: Payer: Self-pay | Admitting: Obstetrics & Gynecology

## 2018-09-18 ENCOUNTER — Encounter: Payer: Self-pay | Admitting: Family Medicine

## 2018-09-18 NOTE — Telephone Encounter (Addendum)
Spoke with patient, advised 09/12/18 vaginitis testing negative. Instructed patient to hold off on using metrogel. Will review with Dr. Sabra Heck and return call on 9/26 with recommendations. Patient verbalizes understanding and is agreeable.    Dr. Sabra Heck - please review results dated 09/12/18 and advise if any additional recommendations.

## 2018-09-18 NOTE — Telephone Encounter (Signed)
Spoke with patient, advised as seen below per Dr. Miller. Patient verbalizes understanding and is agreeable.   

## 2018-09-18 NOTE — Telephone Encounter (Signed)
I left her a detailed message on Friday about this.  Because she was having so much odor, I recommended that she completed the Metrogel as I prescribed.  The testing was negative but I'm treating with this due to the odor.

## 2018-09-18 NOTE — Telephone Encounter (Signed)
Spoke with patient, advised as seen below per Dr. Sabra Heck. Patient states message was hard to hear on her phone, she never started the metrogel, will start tonight. Patient verbalizes understanding.  Routing to provider for final review. Patient is agreeable to disposition. Will close encounter.

## 2018-09-18 NOTE — Telephone Encounter (Signed)
Can you make sure she is also aware that her biopsy only showed granulation tissue which is benign and due to the ulcerations on the vaginal mucosa (that has occurred due to the prolapse)?  I'm glad she has her surgery scheduled.  Thanks.

## 2018-09-18 NOTE — Telephone Encounter (Signed)
Mary, did I understand that I do not have a vaginal infection and do not need to use the prescribed cream. Also my surgery is scheduled for October 24. Thank you for all of your help.   Karen Kays

## 2018-09-24 ENCOUNTER — Encounter: Payer: Self-pay | Admitting: Family Medicine

## 2018-09-24 ENCOUNTER — Ambulatory Visit (INDEPENDENT_AMBULATORY_CARE_PROVIDER_SITE_OTHER): Payer: Medicare Other | Admitting: Family Medicine

## 2018-09-24 VITALS — BP 138/72 | HR 82 | Temp 98.4°F | Ht 59.75 in | Wt 146.8 lb

## 2018-09-24 DIAGNOSIS — R05 Cough: Secondary | ICD-10-CM

## 2018-09-24 DIAGNOSIS — R059 Cough, unspecified: Secondary | ICD-10-CM

## 2018-09-24 DIAGNOSIS — I10 Essential (primary) hypertension: Secondary | ICD-10-CM

## 2018-09-24 DIAGNOSIS — Z0000 Encounter for general adult medical examination without abnormal findings: Secondary | ICD-10-CM | POA: Diagnosis not present

## 2018-09-24 DIAGNOSIS — K219 Gastro-esophageal reflux disease without esophagitis: Secondary | ICD-10-CM

## 2018-09-24 DIAGNOSIS — Z01419 Encounter for gynecological examination (general) (routine) without abnormal findings: Secondary | ICD-10-CM

## 2018-09-24 DIAGNOSIS — N8110 Cystocele, unspecified: Secondary | ICD-10-CM | POA: Diagnosis not present

## 2018-09-24 DIAGNOSIS — R058 Other specified cough: Secondary | ICD-10-CM | POA: Insufficient documentation

## 2018-09-24 MED ORDER — LISINOPRIL 20 MG PO TABS: 20.0000 mg | ORAL_TABLET | Freq: Every day | ORAL | 3 refills | Status: DC

## 2018-09-24 MED ORDER — VITAMIN D (ERGOCALCIFEROL) 1.25 MG (50000 UNIT) PO CAPS: 50000.0000 [IU] | ORAL_CAPSULE | ORAL | 3 refills | Status: DC

## 2018-09-24 NOTE — Patient Instructions (Signed)
Great to see you.  I will keep an eye out for your pulmonary doctor's notes.  Good luck with your surgery!

## 2018-09-24 NOTE — Assessment & Plan Note (Signed)
Improved with higher dose of lisinopril. No changes made to rx.

## 2018-09-24 NOTE — Assessment & Plan Note (Signed)
Unfortunately cough has persisted.  Still seems allergic to me but only mild improvement with xyzal and by treating her GERD more aggressively.  Await pulmonary recommendations. The patient indicates understanding of these issues and agrees with the plan.

## 2018-09-24 NOTE — Assessment & Plan Note (Signed)
Scheduled for surgical repair later this month.

## 2018-09-24 NOTE — Progress Notes (Signed)
Subjective:   Patient ID: Gloria Werner, female    DOB: 26-Dec-1940, 77 y.o.   MRN: [id number]  Gloria Werner is a pleasant 77 y.o. year old female who presents to clinic today with Annual Exam (Patient is here today for a CPE.  Labs completed on 9.11.19. She agrees to [employer].  She is on Vit-D 50k IU once monthly and did not get Septmeber Rx and is needing that refilled.  She does states that she still has a cough and it is driving her crazy and the [employer] were not helping.  She is having Surgery for Prolapse soon.  She was referred to Pulmonologist for her cough on Tues.)  on 09/24/2018  HPI:  Established care with me on 08/22/18.  Notes reviewed.  Health Maintenance  Topic Date Due  . COLONOSCOPY  09/01/2018  . MAMMOGRAM  04/16/2019  . TETANUS/TDAP  10/22/2022  . INFLUENZA VACCINE  Completed  . DEXA SCAN  Completed  . PNA vac Low Risk Adult  Completed     HTN- Restarted lisinopril on 08/22/18 since her daily cough did not resolve once she stopped taking ACEI. On 09/04/18, we increased her lisinopril to 20 mg daily since blood pressure was still at goal.  Lab Results  Component Value Date   CREATININE 0.79 09/04/2018   Lab Results  Component Value Date   CHOL 181 09/04/2018   HDL 52.40 09/04/2018   LDLCALC 115 (H) 09/04/2018   LDLDIRECT 140.5 02/18/2014   TRIG 69.0 09/04/2018   CHOLHDL 3 09/04/2018    Cough- We also started Zantac twice daily, advised her to STOP taking zyrtec and to start xyzal at bedtime.  Unfortunately her cough has improved yet persisted.  She was referred to pulmonary- has appointment on Tuesday.  Followed by GYN- Dr. Hale Bogus. Saw her on 09/12/18 for post menopausal bleeding.  Note reviewed. She found vaginal ulcerations likely caused by significant prolapse.  Vulvular biopsy also neg. CT abd/pelvis  Neg. Scheduled for XI ROBOTIC ASSISTED LAPAROSCOPIC SACROCOLPOPEXY by Dr. Louis Meckel on 10/16/18.  Ct Abdomen Pelvis W  Contrast  Result Date: 09/13/2018 CLINICAL DATA:  Abdominal pain under ribs especially after bending over. Right groin pain and constipation. EXAM: CT ABDOMEN AND PELVIS WITH CONTRAST TECHNIQUE: Multidetector CT imaging of the abdomen and pelvis was performed using the standard protocol following bolus administration of intravenous contrast. CONTRAST:  130mL ISOVUE-300 IOPAMIDOL (ISOVUE-300) INJECTION 61% COMPARISON:  10/21/2015 FINDINGS: Lower chest: 5 mm right lung nodule is identified, image 6/4. Unchanged. Hepatobiliary: No focal liver abnormality is seen. No gallstones, gallbladder wall thickening, or biliary dilatation. Pancreas: Unremarkable. No pancreatic ductal dilatation or surrounding inflammatory changes. Spleen: Normal in size without focal abnormality. Adrenals/Urinary Tract: Adrenal glands are unremarkable. Kidneys are normal, without renal calculi, focal lesion, or hydronephrosis. Bladder is unremarkable. Stomach/Bowel: Stomach appears normal. The small bowel loops have a normal course and caliber without obstruction. The appendix is visualized and appears normal. Scattered diffuse colonic diverticula noted. No acute inflammation identified Vascular/Lymphatic: Aortic atherosclerosis. No aneurysm. No abdominopelvic adenopathy. Reproductive: Status post hysterectomy. No adnexal masses. Other: No abdominal wall hernia or abnormality. No abdominopelvic ascites. Musculoskeletal: No acute or significant osseous findings. Scoliosis and degenerative disc disease identified within the lumbar spine. IMPRESSION: 1. No acute findings within the abdomen or pelvis. 2. Colonic diverticulosis without acute inflammation. 3. Stable 5 mm right lung nodule compatible with a benign abnormality. Electronically Signed   By: Kerby Moors M.D.   On: 09/13/2018  16:51    Current Outpatient Medications on File Prior to Visit  Medication Sig Dispense Refill  . buPROPion (WELLBUTRIN) 75 MG tablet Take 1 tablet (75 mg  total) by mouth 2 (two) times daily. 180 tablet 3  . calcium carbonate (OS-CAL) 600 MG TABS Take 600 mg by mouth 2 (two) times daily with a meal.      . conjugated estrogens (PREMARIN) vaginal cream Use 1/2 g vaginally every other night for the next 2 weeks, then 2 x a week at hs 30 g 1  . levocetirizine (XYZAL) 5 MG tablet Take 1 tablet (5 mg total) by mouth every evening. 30 tablet 3  . metroNIDAZOLE (METROGEL) 0.75 % vaginal gel Place 1 Applicatorful vaginally at bedtime. Use for five nights 70 g 0  . Multiple Vitamin (MULTIVITAMIN) tablet Take 1 tablet by mouth daily.      . Pyridoxine HCl (B-6 PO) Take 1 tablet by mouth daily.    . ranitidine (ZANTAC) 150 MG tablet Take 1 tablet (150 mg total) by mouth 2 (two) times daily. 60 tablet 3  . saccharomyces boulardii (FLORASTOR) 250 MG capsule Take 2 capsules (500 mg total) by mouth 2 (two) times daily. 120 capsule 3  . triamcinolone (NASACORT) 55 MCG/ACT AERO nasal inhaler Place 2 sprays into the nose daily.    . vitamin C (ASCORBIC ACID) 500 MG tablet Take 500 mg by mouth daily.     No current facility-administered medications on file prior to visit.     Allergies  Allergen Reactions  . Codeine Nausea And Vomiting  . Lexapro [Escitalopram] Diarrhea    headache    Past Medical History:  Diagnosis Date  . Allergic rhinitis due to pollen   . Irritable bowel syndrome   . 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 ABDOMINAL HYSTERECTOMY  1988   secondary to prolapse  . BLADDER SUSPENSION     A-P with Hyst  . NASAL SINUS SURGERY  1970  . TONSILLECTOMY      Family History  Problem Relation Age of Onset  . Emphysema Mother   . Lymphoma Mother   . Asthma Mother   . Cancer Father 17       lung cancer  . Coronary artery disease Brother   . Cancer Brother        breast cancer  . Cancer Brother        lung cancer  . Diabetes Neg Hx   . Hypertension Neg Hx   . Colon cancer Neg Hx      Social History   Socioeconomic History  . Marital status: Married    Spouse name: Not on file  . Number of children: 2  . Years of education: 91  . Highest education level: Not on file  Occupational History  . Occupation: Environmental health practitioner  Social Needs  . Financial resource strain: Not on file  . Food insecurity:    Worry: Not on file    Inability: Not on file  . Transportation needs:    Medical: Not on file    Non-medical: Not on file  Tobacco Use  . Smoking status: Never Smoker  . Smokeless tobacco: Never Used  Substance and Sexual Activity  . Alcohol use: No  . Drug use: No  . Sexual activity: Yes    Partners: Male    Birth control/protection: Surgical  Lifestyle  . Physical activity:    Days per week: Not on  file    Minutes per session: Not on file  . Stress: Not on file  Relationships  . Social connections:    Talks on phone: Not on file    Gets together: Not on file    Attends religious service: Not on file    Active member of club or organization: Not on file    Attends meetings of clubs or organizations: Not on file    Relationship status: Not on file  . Intimate partner violence:    Fear of current or ex partner: Not on file    Emotionally abused: Not on file    Physically abused: Not on file    Forced sexual activity: Not on file  Other Topics Concern  . Not on file  Social History Narrative   HSG. Married '63. 1 son- 69'; 1-daughter-'68; grandchildren 3. 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 Marriage in good health. 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 Provided lead to [email or web address].   The PMH, PSH, Social History, Family History, Medications, and allergies have been reviewed in [facility], and have been updated if relevant.    Review of Systems  Constitutional: Negative.   HENT: Negative.   Respiratory: Positive for cough. Negative for choking, shortness of breath,  wheezing and stridor.   Cardiovascular: Negative.   Gastrointestinal: Negative.   Endocrine: Negative.   Genitourinary: Negative.   Musculoskeletal: Negative.   Allergic/Immunologic: Negative.   Neurological: Negative.   Hematological: Negative.   Psychiatric/Behavioral: Negative.   All other systems reviewed and are negative.      Objective:    BP 138/72 (BP Location: Left Arm, Patient Position: Sitting, Cuff Size: Normal)   Pulse 82   Temp 98.4 F (36.9 C) (Oral)   Ht 4' 11.75" (1.518 m)   Wt 146 lb 12.8 oz (66.6 kg)   LMP 12/25/1986 (Approximate)   SpO2 99%   BMI 28.91 kg/m    Physical Exam   General:  Well-developed,well-nourished,in no acute distress; alert,appropriate and cooperative throughout examination Head:  normocephalic and atraumatic.   Eyes:  vision grossly intact, PERRL Ears:  R ear normal and L ear normal externally, TMs clear bilaterally Nose:  no external deformity.   Mouth:  good dentition.   Neck:  No deformities, masses, or tenderness noted. Breasts:  No mass, nodules, thickening, tenderness, bulging, retraction, inflamation, nipple discharge or skin changes noted.   Lungs:  Normal respiratory effort, chest expands symmetrically. Lungs are clear to auscultation, no crackles or wheezes. Heart:  Normal rate and regular rhythm. S1 and S2 normal without gallop, murmur, click, rub or other extra sounds. Msk:  No deformity or scoliosis noted of thoracic or lumbar spine.   Extremities:  No clubbing, cyanosis, edema, or deformity noted with normal full range of motion of all joints.   Neurologic:  alert & oriented X3 and gait normal.   Skin:  Intact without suspicious lesions or rashes Cervical Nodes:  No lymphadenopathy noted Axillary Nodes:  No palpable lymphadenopathy Psych:  Cognition and judgment appear intact. Alert and cooperative with normal attention span and concentration. No apparent delusions, illusions, hallucinations       Assessment &  Plan:   Essential hypertension  Well woman exam  Gastroesophageal reflux disease, esophagitis presence not specified  Vaginal prolapse  Cough No follow-ups on file.

## 2018-09-24 NOTE — Assessment & Plan Note (Signed)
Reviewed preventive care protocols, scheduled due services, and updated immunizations Discussed nutrition, exercise, diet, and healthy lifestyle.  Cologuard ordered. 

## 2018-10-01 ENCOUNTER — Ambulatory Visit: Payer: Medicare Other | Admitting: Pulmonary Disease

## 2018-10-01 ENCOUNTER — Encounter: Payer: Self-pay | Admitting: Pulmonary Disease

## 2018-10-01 ENCOUNTER — Other Ambulatory Visit (INDEPENDENT_AMBULATORY_CARE_PROVIDER_SITE_OTHER): Payer: Medicare Other

## 2018-10-01 VITALS — BP 144/82 | HR 87 | Ht 60.0 in | Wt 147.4 lb

## 2018-10-01 DIAGNOSIS — R05 Cough: Secondary | ICD-10-CM

## 2018-10-01 DIAGNOSIS — R059 Cough, unspecified: Secondary | ICD-10-CM

## 2018-10-01 LAB — CBC WITH DIFFERENTIAL/PLATELET
BASOS ABS: 0.1 10*3/uL (ref 0.0–0.1)
Basophils Relative: 1 % (ref 0.0–3.0)
EOS ABS: 0.3 10*3/uL (ref 0.0–0.7)
Eosinophils Relative: 4.6 % (ref 0.0–5.0)
HCT: 38.5 % (ref 36.0–46.0)
Hemoglobin: 12.8 g/dL (ref 12.0–15.0)
LYMPHS ABS: 1.5 10*3/uL (ref 0.7–4.0)
Lymphocytes Relative: 27.4 % (ref 12.0–46.0)
MCHC: 33.3 g/dL (ref 30.0–36.0)
MCV: 89.5 fl (ref 78.0–100.0)
MONOS PCT: 7.8 % (ref 3.0–12.0)
Monocytes Absolute: 0.4 10*3/uL (ref 0.1–1.0)
NEUTROS PCT: 59.2 % (ref 43.0–77.0)
Neutro Abs: 3.3 10*3/uL (ref 1.4–7.7)
Platelets: 197 10*3/uL (ref 150.0–400.0)
RBC: 4.3 Mil/uL (ref 3.87–5.11)
RDW: 13.2 % (ref 11.5–15.5)
WBC: 5.6 10*3/uL (ref 4.0–10.5)

## 2018-10-01 LAB — NITRIC OXIDE: Nitric Oxide: 13

## 2018-10-01 MED ORDER — CHLORPHENIRAMINE MALEATE 4 MG PO TABS: 8.0000 mg | ORAL_TABLET | Freq: Three times a day (TID) | ORAL | 0 refills | Status: DC

## 2018-10-01 MED ORDER — AZELASTINE HCL 0.1 % NA SOLN: 1.0000 | Freq: Two times a day (BID) | NASAL | 1 refills | Status: DC

## 2018-10-01 MED ORDER — HYDROCODONE-HOMATROPINE 5-1.5 MG/5ML PO SYRP: 5.0000 mL | ORAL_SOLUTION | Freq: Four times a day (QID) | ORAL | 0 refills | Status: DC | PRN

## 2018-10-01 NOTE — Progress Notes (Addendum)
Gloria Werner    [id number]    05-10-1941  Primary Care Physician:Aron, Marciano Sequin, MD  Referring Physician: Megan Salon, MD [address] [place], [place] [address]  Chief complaint: Consult for cough  HPI: 77 year old with history of hypertension.  Complains of chronic cough for the past 5 months.  She was started on lisinopril 06/03/2018 and notes she started developing cough within 2 to 4 days after this. She was taken off lisinopril for 4 weeks but the cough did not improve and hence lisinopril was restarted Tried treatment for postnasal drip with Claritin, Zyrtec, Xyzal, Nasacort for acid reflux with H2 blocker with no improvement.  She is given PPI but did not start out of concern for C. difficile.  She is scheduled for surgery for uterine prolapse on 10/21 and needs to get rid of the cough before that.  Chief complaint is chronic cough with clear mucus, postnasal drip, irritation of the back of the throat and need to constantly clear her throat.  Denies any acid reflux, dyspnea, wheezing, fevers, chills.  Pets: Has a dog, no cats, birds, farm animals. 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 Exposures: No mold, hot tub, Jacuzzi, humidifier Smoking history: Never smoker Travel history: No significant travel Relevant family history: Mother had emphysema, asthma.  Outpatient Encounter Medications as of 10/01/2018  Medication Sig  . buPROPion (WELLBUTRIN) 75 MG tablet Take 1 tablet (75 mg total) by mouth 2 (two) times daily.  . calcium carbonate (OS-CAL) 600 MG TABS Take 600 mg by mouth 2 (two) times daily with a meal.    . conjugated estrogens (PREMARIN) vaginal cream Use 1/2 g vaginally every other night for the next 2 weeks, then 2 x a week at hs  . levocetirizine (XYZAL) 5 MG tablet Take 1 tablet (5 mg total) by mouth every evening.  Marland Kitchen lisinopril (PRINIVIL,ZESTRIL) 20 MG tablet Take 1 tablet (20 mg total) by mouth daily.  .  metroNIDAZOLE (METROGEL) 0.75 % vaginal gel Place 1 Applicatorful vaginally at bedtime. Use for five nights  . Multiple Vitamin (MULTIVITAMIN) tablet Take 1 tablet by mouth daily.    . Pyridoxine HCl (B-6 PO) Take 1 tablet by mouth daily.  . ranitidine (ZANTAC) 150 MG tablet Take 1 tablet (150 mg total) by mouth 2 (two) times daily.  Marland Kitchen saccharomyces boulardii (FLORASTOR) 250 MG capsule Take 2 capsules (500 mg total) by mouth 2 (two) times daily.  Marland Kitchen triamcinolone (NASACORT) 55 MCG/ACT AERO nasal inhaler Place 2 sprays into the nose daily.  . vitamin C (ASCORBIC ACID) 500 MG tablet Take 500 mg by mouth daily.  . Vitamin D, Ergocalciferol, (DRISDOL) 50000 units CAPS capsule Take 1 capsule (50,000 Units total) by mouth every 30 (thirty) days.   No facility-administered encounter medications on file as of 10/01/2018.     Allergies as of 10/01/2018 - Review Complete 10/01/2018  Allergen Reaction Noted  . Codeine Nausea And Vomiting 10/31/2010  . Lexapro [escitalopram] Diarrhea 04/08/2015    Past Medical History:  Diagnosis Date  . Allergic rhinitis due to pollen   . Irritable bowel syndrome   . 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 ABDOMINAL HYSTERECTOMY  1988   secondary to prolapse  . BLADDER SUSPENSION     A-P with Hyst  . NASAL SINUS SURGERY  1970  . TONSILLECTOMY      Family History  Problem  Relation Age of Onset  . Emphysema Mother   . Lymphoma Mother   . Asthma Mother   . Cancer Father 45       lung cancer  . Coronary artery disease Brother   . Cancer Brother        breast cancer  . Cancer Brother        lung cancer  . Diabetes Neg Hx   . Hypertension Neg Hx   . Colon cancer Neg Hx     Social History   Socioeconomic History  . Marital status: Married    Spouse name: Not on file  . Number of children: 2  . Years of education: 67  . Highest education level: Not on file  Occupational History  . Occupation:  Environmental health practitioner  Social Needs  . Financial resource strain: Not on file  . Food insecurity:    Worry: Not on file    Inability: Not on file  . Transportation needs:    Medical: Not on file    Non-medical: Not on file  Tobacco Use  . Smoking status: Never Smoker  . Smokeless tobacco: Never Used  Substance and Sexual Activity  . Alcohol use: No  . Drug use: No  . Sexual activity: Yes    Partners: Male    Birth control/protection: Surgical  Lifestyle  . Physical activity:    Days per week: Not on file    Minutes per session: Not on file  . Stress: Not on file  Relationships  . Social connections:    Talks on phone: Not on file    Gets together: Not on file    Attends religious service: Not on file    Active member of club or organization: Not on file    Attends meetings of clubs or organizations: Not on file    Relationship status: Not on file  . Intimate partner violence:    Fear of current or ex partner: Not on file    Emotionally abused: Not on file    Physically abused: Not on file    Forced sexual activity: Not on file  Other Topics Concern  . Not on file  Social History Narrative   HSG. Married '63. 1 son- 19'; 1-daughter-'68; grandchildren 3. 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 Marriage in good health. 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 Provided lead to [email or web address].   Review of systems: Review of Systems  Constitutional: Negative for fever and chills.  HENT: Negative.   Eyes: Negative for blurred vision.  Respiratory: as per HPI  Cardiovascular: Negative for chest pain and palpitations.  Gastrointestinal: Negative for vomiting, diarrhea, blood per rectum. Genitourinary: Negative for dysuria, urgency, frequency and hematuria.  Musculoskeletal: Negative for myalgias, back pain and joint pain.  Skin: Negative for itching and rash.  Neurological: Negative for dizziness,  tremors, focal weakness, seizures and loss of consciousness.  Endo/Heme/Allergies: Negative for environmental allergies.  Psychiatric/Behavioral: Negative for depression, suicidal ideas and hallucinations.  All other systems reviewed and are negative.  Physical Exam: Blood pressure (!) 144/82, pulse 87, height 5' (1.524 m), weight 147 lb 6.4 oz (66.9 kg), last menstrual period 12/25/1986, SpO2 98 %. Gen:      No acute distress HEENT:  EOMI, sclera anicteric Neck:     No masses; no thyromegaly Lungs:    Clear to auscultation bilaterally; normal respiratory effort CV:         Regular  rate and rhythm; no murmurs Abd:      + bowel sounds; soft, non-tender; no palpable masses, no distension Ext:    No edema; adequate peripheral perfusion Skin:      Warm and dry; no rash Neuro: alert and oriented x 3 Psych: normal mood and affect  Data Reviewed: Imaging: CT abdomen pelvis 02/02/2010-4 mm right subpleural lung nodule.  No acute pulmonary findings CT abdomen pelvis 10/21/2015-stable right pleural lung nodule, no acute lung findings Chest x-ray 08/22/2018- no acute cardiopulmonary abnormality CT abdomen pelvis 09/13/2018- 4-5 mm right lung nodule.  No acute findings. I have reviewed the images personally.  PFTs: Pending  FENO 10/01/2018-13  Labs: CBC 09/04/2018-WBC 4.7, eos 4.2%, absolute eosinophil count 197  Assessment:  Chronic cough Likely has upper airway cough from postnasal drip.   Suspect ACEI is playing a big role in her symptoms her symptoms started shortly after initiation of therapy.  Although she was given a break of 4 weeks off lisinopril, this may not be long enough to improve symptoms as cough can be self-perpetuating and persistent once started. Suspicion for intrinsic lung problems such as asthma and COPD is low. No evidence of ILD.  She has been adequately treated for GERD with no improvement in the past.  Get in touch with her primary to see if she can come off lisinopril  altogether For now we will be aggressive with postnasal drip I will start first generation antihistamine with chlorpheniramine 8 mg 3 times daily, increase Nasacort to twice daily Start Astelin nasal spray. Cough suppression with Hycodan.  Continue Tessalon and Delsym as needed. Check CBC with diff, IgE and PFTs  I educated her on behavioral changes to deal with cough including conscious suppression of the urge to cough, use of throat lozenges.  Plan/Recommendations: - Chlorpheniramine, Astelin.  Increase Nasacort to twice daily - Hycodan cough syrup.  Continue Tessalon and Delsym as needed - Suggest stopping lisinopril again - CBC, IgE, PFTs  Marshell Garfinkel MD [facility] 10/01/2018, 10:30 AM  CC: Megan Salon, MD

## 2018-10-01 NOTE — Patient Instructions (Addendum)
Use chlorpheniramine 8 mg 3 times daily Continue Nasacort.  You can use it twice a day We will add Astelin nasal spray  Use these medications for a week to 10 days.  If there is no improvement in cough then get in touch with her primary care and see if he can come off lisinopril again Continue Tessalon for cough. You can use Delsym over-the-counter for cough We will give a prescription for hycodan  We will schedule you for pulmonary function test We will check CBC differential, IgE level today  You need to try to suppress your cough to allow your larynx (voice box) to heal.  For three days don't talk, laugh, sing, or clear your throat. Do everything you can to suppress the cough during this time. Use hard candies (sugarless Jolly Ranchers) or non-mint or non-menthol containing cough drops during this time to soothe your throat.  Use a cough suppressant (Delsym or what I have prescribed you) around the clock during this time.  After three days, gradually increase the use of your voice and back off on the cough suppressants.  Follow-up in 1 month.

## 2018-10-02 DIAGNOSIS — Z1212 Encounter for screening for malignant neoplasm of rectum: Secondary | ICD-10-CM | POA: Diagnosis not present

## 2018-10-02 DIAGNOSIS — Z1211 Encounter for screening for malignant neoplasm of colon: Secondary | ICD-10-CM | POA: Diagnosis not present

## 2018-10-02 LAB — COLOGUARD

## 2018-10-02 LAB — IGE: IgE (Immunoglobulin E), Serum: 53 kU/L (ref <=114)

## 2018-10-07 ENCOUNTER — Telehealth: Payer: Self-pay | Admitting: Family Medicine

## 2018-10-07 NOTE — Telephone Encounter (Signed)
We can certainly try stopping lisinopril again.  Can she come see me this week?

## 2018-10-07 NOTE — Telephone Encounter (Signed)
Copied from [place] [id number]. Topic: General - Inquiry >> Oct 07, 2018  8:05 AM Gloria Werner wrote: Reason for CRM: pt called about a cough she's had for 5 months; pt is worried about a surgery coming up and is inquiring if its her blood pressure medicine is the medication that is causing her cough; contact to advise

## 2018-10-07 NOTE — Telephone Encounter (Signed)
Thank you :)

## 2018-10-07 NOTE — Telephone Encounter (Signed)
I called and spoke with patient. She is going to come in at 9:00 am on Wednesday, it won't let me schedule it until tomorrow, but I have blocked the spot for her.

## 2018-10-07 NOTE — Telephone Encounter (Signed)
Routed nicorrectly.

## 2018-10-09 ENCOUNTER — Ambulatory Visit: Payer: Medicare Other | Admitting: Family Medicine

## 2018-10-09 VITALS — BP 140/76 | HR 85 | Temp 98.6°F | Ht 60.0 in | Wt 145.8 lb

## 2018-10-09 DIAGNOSIS — R059 Cough, unspecified: Secondary | ICD-10-CM

## 2018-10-09 DIAGNOSIS — I10 Essential (primary) hypertension: Secondary | ICD-10-CM | POA: Diagnosis not present

## 2018-10-09 DIAGNOSIS — R05 Cough: Secondary | ICD-10-CM | POA: Diagnosis not present

## 2018-10-09 MED ORDER — BENZONATATE 200 MG PO CAPS: 200.0000 mg | ORAL_CAPSULE | Freq: Three times a day (TID) | ORAL | 0 refills | Status: DC | PRN

## 2018-10-09 NOTE — Progress Notes (Signed)
Subjective:   Patient ID: Gloria Werner, female    DOB: 19-Aug-1941, 77 y.o.   MRN: [id number]  Gloria Werner is a pleasant 77 y.o. year old female who presents to clinic today with Medication Management (pt. reports that since she started the Lisinopril medication 5 months ago, she has developed an ongoing cough)  on 10/09/2018  HPI:  Cough- has been a chronic issue.  Chart reviewed.    At her initial appointment on 08/22/18, she had been on amlodipine for months as lisinopril was stopped due to a chronic cough.  The cough persisted on amlodipine and the amlodipine caused LE edema so we put her back on ACEI.  Also added treatment for postnasal drip with Claritin, Zyrtec, Xyzal, Nasacort for acid reflux with H2 blocker with no improvement.  She is given PPI but did not start out of concern for C. difficile.    Ordered a CXR and referred her to pulmonary   Saw pulmonary, Dr. Vaughan Browner on 10/01/18- note reviewed- His assessment and plan was as follows:   Chronic cough Likely has upper airway cough from postnasal drip.   Suspect ACEI is playing a big role in her symptoms her symptoms started shortly after initiation of therapy.  Although she was given a break of 4 weeks off lisinopril, this may not be long enough to improve symptoms as cough can be self-perpetuating and persistent once started. Suspicion for intrinsic lung problems such as asthma and COPD is low. No evidence of ILD.  She has been adequately treated for GERD with no improvement in the past.  Get in touch with her primary to see if she can come off lisinopril altogether For now we will be aggressive with postnasal drip I will start first generation antihistamine with chlorpheniramine 8 mg 3 times daily, increase Nasacort to twice daily Start Astelin nasal spray. Cough suppression with Hycodan.  Continue Tessalon and Delsym as needed. Check CBC with diff, IgE and PFTs  I educated her on behavioral changes to  deal with cough including conscious suppression of the urge to cough, use of throat lozenges.  Plan/Recommendations: - Chlorpheniramine, Astelin.  Increase Nasacort to twice daily - Hycodan cough syrup.  Continue Tessalon and Delsym as needed - Suggest stopping lisinopril again - CBC, IgE, PFTs   She is scheduled for surgery for uterine prolapse on 10/21 and needs to get rid of the cough before that.  Tessalon does help some.  Current Outpatient Medications on File Prior to Visit  Medication Sig Dispense Refill  . buPROPion (WELLBUTRIN) 75 MG tablet Take 1 tablet (75 mg total) by mouth 2 (two) times daily. 180 tablet 3  . calcium carbonate (OS-CAL) 600 MG TABS Take 600 mg by mouth 2 (two) times daily with a meal.      . conjugated estrogens (PREMARIN) vaginal cream Use 1/2 g vaginally every other night for the next 2 weeks, then 2 x a week at hs 30 g 1  . levocetirizine (XYZAL) 5 MG tablet Take 1 tablet (5 mg total) by mouth every evening. 30 tablet 3  . lisinopril (PRINIVIL,ZESTRIL) 20 MG tablet Take 1 tablet (20 mg total) by mouth daily. 90 tablet 3  . Multiple Vitamin (MULTIVITAMIN) tablet Take 1 tablet by mouth daily.      . Pyridoxine HCl (B-6 PO) Take 1 tablet by mouth daily.    Marland Kitchen saccharomyces boulardii (FLORASTOR) 250 MG capsule Take 2 capsules (500 mg total) by mouth 2 (two) times daily. Phillips  capsule 3  . triamcinolone (NASACORT) 55 MCG/ACT AERO nasal inhaler Place 2 sprays into the nose daily.    . vitamin C (ASCORBIC ACID) 500 MG tablet Take 500 mg by mouth daily.    . Vitamin D, Ergocalciferol, (DRISDOL) 50000 units CAPS capsule Take 1 capsule (50,000 Units total) by mouth every 30 (thirty) days. 3 capsule 3  . azelastine (ASTELIN) 0.1 % nasal spray Place 1 spray into both nostrils 2 (two) times daily. Use in each nostril as directed (Patient not taking: Reported on 10/09/2018) 30 mL 1  . chlorpheniramine (CHLOR-TRIMETON) 4 MG tablet Take 2 tablets (8 mg total) by mouth 3 (three)  times daily. (Patient not taking: Reported on 10/09/2018) 190 tablet 0  . HYDROcodone-homatropine (HYCODAN) 5-1.5 MG/5ML syrup Take 5 mLs by mouth every 6 (six) hours as needed for cough. (Patient not taking: Reported on 10/09/2018) 120 mL 0  . metroNIDAZOLE (METROGEL) 0.75 % vaginal gel Place 1 Applicatorful vaginally at bedtime. Use for five nights (Patient not taking: Reported on 10/09/2018) 70 g 0  . ranitidine (ZANTAC) 150 MG tablet Take 1 tablet (150 mg total) by mouth 2 (two) times daily. (Patient not taking: Reported on 10/09/2018) 60 tablet 3   No current facility-administered medications on file prior to visit.     Allergies  Allergen Reactions  . Codeine Nausea And Vomiting  . Lexapro [Escitalopram] Diarrhea and Other (See Comments)    headache    Past Medical History:  Diagnosis Date  . Allergic rhinitis due to pollen   . Irritable bowel syndrome   . 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 ABDOMINAL HYSTERECTOMY  1988   secondary to prolapse  . BLADDER SUSPENSION     A-P with Hyst  . NASAL SINUS SURGERY  1970  . TONSILLECTOMY      Family History  Problem Relation Age of Onset  . Emphysema Mother   . Lymphoma Mother   . Asthma Mother   . Cancer Father 65       lung cancer  . Coronary artery disease Brother   . Cancer Brother        breast cancer  . Cancer Brother        lung cancer  . Diabetes Neg Hx   . Hypertension Neg Hx   . Colon cancer Neg Hx     Social History   Socioeconomic History  . Marital status: Married    Spouse name: Not on file  . Number of children: 2  . Years of education: 48  . Highest education level: Not on file  Occupational History  . Occupation: Environmental health practitioner  Social Needs  . Financial resource strain: Not on file  . Food insecurity:    Worry: Not on file    Inability: Not on file  . Transportation needs:    Medical: Not on file    Non-medical: Not on file  Tobacco  Use  . Smoking status: Never Smoker  . Smokeless tobacco: Never Used  Substance and Sexual Activity  . Alcohol use: No  . Drug use: No  . Sexual activity: Yes    Partners: Male    Birth control/protection: Surgical  Lifestyle  . Physical activity:    Days per week: Not on file    Minutes per session: Not on file  . Stress: Not on file  Relationships  . Social connections:    Talks on phone: Not on file  Gets together: Not on file    Attends religious service: Not on file    Active member of club or organization: Not on file    Attends meetings of clubs or organizations: Not on file    Relationship status: Not on file  . Intimate partner violence:    Fear of current or ex partner: Not on file    Emotionally abused: Not on file    Physically abused: Not on file    Forced sexual activity: Not on file  Other Topics Concern  . Not on file  Social History Narrative   HSG. Married '63. 1 son- 61'; 1-daughter-'68; grandchildren 3. 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 Marriage in good health. 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 Provided lead to [email or web address].   The PMH, PSH, Social History, Family History, Medications, and allergies have been reviewed in [facility], and have been updated if relevant.   Review of Systems  Constitutional: Negative.   HENT: Negative.   Respiratory: Positive for cough.   Neurological: Negative.   All other systems reviewed and are negative.      Objective:    BP 140/76 (BP Location: Left Arm, Cuff Size: Normal)   Pulse 85   Temp 98.6 F (37 C) (Oral)   Ht 5' (1.524 m)   Wt 145 lb 12.8 oz (66.1 kg)   LMP 12/25/1986 (Approximate)   SpO2 98%   BMI 28.47 kg/m    Physical Exam  Constitutional: She is oriented to person, place, and time. She appears well-developed and well-nourished. No distress.  HENT:  Head: Normocephalic and atraumatic.  Eyes: EOM are  normal.  Neck: Normal range of motion.  Cardiovascular: Normal rate.  Pulmonary/Chest: Effort normal.  Musculoskeletal: Normal range of motion.  Neurological: She is alert and oriented to person, place, and time. No cranial nerve deficit.  Skin: Skin is warm and dry. She is not diaphoretic.  Psychiatric: She has a normal mood and affect. Her behavior is normal. Judgment and thought content normal.  Nursing note and vitals reviewed.         Assessment & Plan:   Cough  Essential hypertension No follow-ups on file.

## 2018-10-09 NOTE — Assessment & Plan Note (Addendum)
>  15 minutes spent in face to face time with patient, >50% spent in counselling or coordination of care.  We agreed to not make changes until after her surgery as any changes now will take weeks to take effect. Tessalon dose increased to 200 mg three times daily as needed for cough. The patient indicates understanding of these issues and agrees with the plan.

## 2018-10-09 NOTE — Patient Instructions (Addendum)
Good luck with your surgery! Come see after your surgery- in approximately 4 weeks.

## 2018-10-11 NOTE — Progress Notes (Signed)
10-01-18 (Epic) CBC w/Diff  08-22-18 (Epic) CXR

## 2018-10-11 NOTE — Patient Instructions (Addendum)
Gloria Werner  10/11/2018   Your procedure is scheduled on: 10-16-18     Report to [facility] Main  Entrance    Report to Admitting at 5:30 AM    Call this number if you have problems the morning of surgery [phone]     Remember: Do not eat food or drink liquids :After Midnight.    BRUSH YOUR TEETH MORNING OF SURGERY AND RINSE YOUR MOUTH OUT, NO CHEWING GUM CANDY OR MINTS.     Take these medicines the morning of surgery with A SIP OF WATER: Bupropion (Wellbutrin). You may also use your eyedrops and nasal spray as needed.                                You may not have any metal on your body including hair pins and              piercings  Do not wear jewelry, make-up, lotions, powders or perfumes, deodorant             Do not wear nail polish.  Do not shave  48 hours prior to surgery.                 Do not bring valuables to the hospital. [place].  Contacts, dentures or bridgework may not be worn into surgery.  Leave suitcase in the car. After surgery it may be brought to your room.     Special Instructions: Follow your prep, per your surgeon's instructions              Please read over the following fact sheets you were given: _____________________________________________________________________             [facility] - Preparing for Surgery Before surgery, you can play an important role.  Because skin is not sterile, your skin needs to be as free of germs as possible.  You can reduce the number of germs on your skin by washing with CHG (chlorahexidine gluconate) soap before surgery.  CHG is an antiseptic cleaner which kills germs and bonds with the skin to continue killing germs even after washing. Please DO NOT use if you have an allergy to CHG or antibacterial soaps.  If your skin becomes reddened/irritated stop using the CHG and inform your nurse when you arrive at Short Stay. Do  not shave (including legs and underarms) for at least 48 hours prior to the first CHG shower.  You may shave your face/neck. Please follow these instructions carefully:  1.  Shower with CHG Soap the night before surgery and the  morning of Surgery.  2.  If you choose to wash your hair, wash your hair first as usual with your  normal  shampoo.  3.  After you shampoo, rinse your hair and body thoroughly to remove the  shampoo.                           4.  Use CHG as you would any other liquid soap.  You can apply chg directly  to the skin and wash  Gently with a scrungie or clean washcloth.  5.  Apply the CHG Soap to your body ONLY FROM THE NECK DOWN.   Do not use on face/ open                           Wound or open sores. Avoid contact with eyes, ears mouth and genitals (private parts).                       Wash face,  Genitals (private parts) with your normal soap.             6.  Wash thoroughly, paying special attention to the area where your surgery  will be performed.  7.  Thoroughly rinse your body with warm water from the neck down.  8.  DO NOT shower/wash with your normal soap after using and rinsing off  the CHG Soap.                9.  Pat yourself dry with a clean towel.            10.  Wear clean pajamas.            11.  Place clean sheets on your bed the night of your first shower and do not  sleep with pets. Day of Surgery : Do not apply any lotions/deodorants the morning of surgery.  Please wear clean clothes to the hospital/surgery center.  FAILURE TO FOLLOW THESE INSTRUCTIONS MAY RESULT IN THE CANCELLATION OF YOUR SURGERY PATIENT SIGNATURE_________________________________  NURSE SIGNATURE__________________________________  ________________________________________________________________________  WHAT IS A BLOOD TRANSFUSION? Blood Transfusion Information  A transfusion is the replacement of blood or some of its parts. Blood is made up of multiple  cells which provide different functions.  Red blood cells carry oxygen and are used for blood loss replacement.  White blood cells fight against infection.  Platelets control bleeding.  Plasma helps clot blood.  Other blood products are available for specialized needs, such as hemophilia or other clotting disorders. BEFORE THE TRANSFUSION  Who gives blood for transfusions?   Healthy volunteers who are fully evaluated to make sure their blood is safe. This is blood bank blood. Transfusion therapy is the safest it has ever been in the practice of medicine. Before blood is taken from a donor, a complete history is taken to make sure that person has no history of diseases nor engages in risky social behavior (examples are intravenous drug use or sexual activity with multiple partners). The donor's travel history is screened to minimize risk of transmitting infections, such as malaria. The donated blood is tested for signs of infectious diseases, such as HIV and hepatitis. The blood is then tested to be sure it is compatible with you in order to minimize the chance of a transfusion reaction. If you or a relative donates blood, this is often done in anticipation of surgery and is not appropriate for emergency situations. It takes many days to process the donated blood. RISKS AND COMPLICATIONS Although transfusion therapy is very safe and saves many lives, the main dangers of transfusion include:   Getting an infectious disease.  Developing a transfusion reaction. This is an allergic reaction to something in the blood you were given. Every precaution is taken to prevent this. The decision to have a blood transfusion has been considered carefully by your caregiver before blood is given. Blood is not given unless the benefits outweigh  the risks. AFTER THE TRANSFUSION  Right after receiving a blood transfusion, you will usually feel much better and more energetic. This is especially true if your red  blood cells have gotten low (anemic). The transfusion raises the level of the red blood cells which carry oxygen, and this usually causes an energy increase.  The nurse administering the transfusion will monitor you carefully for complications. HOME CARE INSTRUCTIONS  No special instructions are needed after a transfusion. You may find your energy is better. Speak with your caregiver about any limitations on activity for underlying diseases you may have. SEEK MEDICAL CARE IF:   Your condition is not improving after your transfusion.  You develop redness or irritation at the intravenous (IV) site. SEEK IMMEDIATE MEDICAL CARE IF:  Any of the following symptoms occur over the next 12 hours:  Shaking chills.  You have a temperature by mouth above 102 F (38.9 C), not controlled by medicine.  Chest, back, or muscle pain.  People around you feel you are not acting correctly or are confused.  Shortness of breath or difficulty breathing.  Dizziness and fainting.  You get a rash or develop hives.  You have a decrease in urine output.  Your urine turns a dark color or changes to pink, red, or brown. Any of the following symptoms occur over the next 10 days:  You have a temperature by mouth above 102 F (38.9 C), not controlled by medicine.  Shortness of breath.  Weakness after normal activity.  The white part of the eye turns yellow (jaundice).  You have a decrease in the amount of urine or are urinating less often.  Your urine turns a dark color or changes to pink, red, or brown. Document Released: 12/08/2000 Document Revised: 03/04/2012 Document Reviewed: 07/27/2008 [facility] Patient Information 2014 [place], [place].  _______________________________________________________________________

## 2018-10-13 ENCOUNTER — Encounter: Payer: Self-pay | Admitting: Internal Medicine

## 2018-10-14 ENCOUNTER — Other Ambulatory Visit: Payer: Self-pay

## 2018-10-14 ENCOUNTER — Encounter (HOSPITAL_COMMUNITY)
Admission: RE | Admit: 2018-10-14 | Discharge: 2018-10-14 | Disposition: A | Discharge status: Home / Self Care | Payer: Medicare Other | Source: Ambulatory Visit | Attending: Urology | Admitting: Urology

## 2018-10-14 ENCOUNTER — Encounter (HOSPITAL_COMMUNITY): Payer: Self-pay

## 2018-10-14 DIAGNOSIS — K5900 Constipation, unspecified: Secondary | ICD-10-CM | POA: Diagnosis not present

## 2018-10-14 DIAGNOSIS — Z01818 Encounter for other preprocedural examination: Secondary | ICD-10-CM | POA: Insufficient documentation

## 2018-10-14 DIAGNOSIS — M8580 Other specified disorders of bone density and structure, unspecified site: Secondary | ICD-10-CM | POA: Diagnosis not present

## 2018-10-14 DIAGNOSIS — F329 Major depressive disorder, single episode, unspecified: Secondary | ICD-10-CM | POA: Diagnosis not present

## 2018-10-14 DIAGNOSIS — F39 Unspecified mood [affective] disorder: Secondary | ICD-10-CM | POA: Diagnosis not present

## 2018-10-14 DIAGNOSIS — I10 Essential (primary) hypertension: Secondary | ICD-10-CM | POA: Diagnosis not present

## 2018-10-14 DIAGNOSIS — K589 Irritable bowel syndrome without diarrhea: Secondary | ICD-10-CM | POA: Diagnosis not present

## 2018-10-14 DIAGNOSIS — R32 Unspecified urinary incontinence: Secondary | ICD-10-CM | POA: Diagnosis not present

## 2018-10-14 DIAGNOSIS — N8189 Other female genital prolapse: Secondary | ICD-10-CM | POA: Diagnosis not present

## 2018-10-14 DIAGNOSIS — K219 Gastro-esophageal reflux disease without esophagitis: Secondary | ICD-10-CM | POA: Diagnosis not present

## 2018-10-14 DIAGNOSIS — Z79899 Other long term (current) drug therapy: Secondary | ICD-10-CM | POA: Diagnosis not present

## 2018-10-14 HISTORY — DX: Adverse effect of unspecified anesthetic, initial encounter: T41.45XA

## 2018-10-14 HISTORY — DX: Other complications of anesthesia, initial encounter: T88.59XA

## 2018-10-14 HISTORY — DX: Other specified postprocedural states: Z98.890

## 2018-10-14 HISTORY — DX: Nausea with vomiting, unspecified: R11.2

## 2018-10-14 LAB — COMPREHENSIVE METABOLIC PANEL
ALK PHOS: 46 U/L (ref 38–126)
ALT: 18 U/L (ref 0–44)
ANION GAP: 8 (ref 5–15)
AST: 28 U/L (ref 15–41)
Albumin: 4.1 g/dL (ref 3.5–5.0)
BUN: 17 mg/dL (ref 8–23)
CO2: 26 mmol/L (ref 22–32)
Calcium: 9.3 mg/dL (ref 8.9–10.3)
Chloride: 106 mmol/L (ref 98–111)
Creatinine, Ser: 0.74 mg/dL (ref 0.44–1.00)
GFR calc Af Amer: >60 mL/min (ref >60)
GLUCOSE: 109 mg/dL — AB (ref 70–99)
POTASSIUM: 4.3 mmol/L (ref 3.5–5.1)
SODIUM: 140 mmol/L (ref 135–145)
Total Bilirubin: 0.4 mg/dL (ref 0.3–1.2)
Total Protein: 7.1 g/dL (ref 6.5–8.1)

## 2018-10-14 LAB — ABO/RH: ABO/RH(D): A POS

## 2018-10-15 LAB — URINE CULTURE

## 2018-10-16 ENCOUNTER — Encounter (HOSPITAL_COMMUNITY)
Admission: RE | Disposition: A | Discharge status: Home / Self Care | Payer: Self-pay | Source: Ambulatory Visit | Attending: Urology

## 2018-10-16 ENCOUNTER — Encounter (HOSPITAL_COMMUNITY): Payer: Self-pay | Admitting: *Deleted

## 2018-10-16 ENCOUNTER — Ambulatory Visit (HOSPITAL_COMMUNITY): Payer: Medicare Other | Admitting: Anesthesiology

## 2018-10-16 ENCOUNTER — Observation Stay (HOSPITAL_COMMUNITY)
Admission: RE | Admit: 2018-10-16 | Discharge: 2018-10-17 | Disposition: A | Discharge status: Home / Self Care | Payer: Medicare Other | Source: Ambulatory Visit | Attending: Urology | Admitting: Urology

## 2018-10-16 ENCOUNTER — Other Ambulatory Visit: Payer: Self-pay

## 2018-10-16 DIAGNOSIS — N8189 Other female genital prolapse: Secondary | ICD-10-CM | POA: Diagnosis not present

## 2018-10-16 DIAGNOSIS — R32 Unspecified urinary incontinence: Secondary | ICD-10-CM | POA: Insufficient documentation

## 2018-10-16 DIAGNOSIS — K5900 Constipation, unspecified: Secondary | ICD-10-CM | POA: Insufficient documentation

## 2018-10-16 DIAGNOSIS — K589 Irritable bowel syndrome without diarrhea: Secondary | ICD-10-CM | POA: Insufficient documentation

## 2018-10-16 DIAGNOSIS — K219 Gastro-esophageal reflux disease without esophagitis: Secondary | ICD-10-CM | POA: Insufficient documentation

## 2018-10-16 DIAGNOSIS — Z79899 Other long term (current) drug therapy: Secondary | ICD-10-CM | POA: Insufficient documentation

## 2018-10-16 DIAGNOSIS — N814 Uterovaginal prolapse, unspecified: Secondary | ICD-10-CM

## 2018-10-16 DIAGNOSIS — M8580 Other specified disorders of bone density and structure, unspecified site: Secondary | ICD-10-CM | POA: Diagnosis not present

## 2018-10-16 DIAGNOSIS — I10 Essential (primary) hypertension: Secondary | ICD-10-CM | POA: Diagnosis not present

## 2018-10-16 DIAGNOSIS — N819 Female genital prolapse, unspecified: Secondary | ICD-10-CM | POA: Diagnosis not present

## 2018-10-16 DIAGNOSIS — F39 Unspecified mood [affective] disorder: Secondary | ICD-10-CM | POA: Insufficient documentation

## 2018-10-16 DIAGNOSIS — F329 Major depressive disorder, single episode, unspecified: Secondary | ICD-10-CM | POA: Insufficient documentation

## 2018-10-16 HISTORY — PX: ROBOTIC ASSISTED LAPAROSCOPIC SACROCOLPOPEXY: SHX5388

## 2018-10-16 HISTORY — PX: INSERTION OF MESH: SHX5868

## 2018-10-16 LAB — HEMOGLOBIN AND HEMATOCRIT, BLOOD
HEMATOCRIT: 40.1 % (ref 36.0–46.0)
HEMOGLOBIN: 12.3 g/dL (ref 12.0–15.0)

## 2018-10-16 LAB — TYPE AND SCREEN
ABO/RH(D): A POS
Antibody Screen: NEGATIVE

## 2018-10-16 SURGERY — SACROCOLPOPEXY, ROBOT-ASSISTED, LAPAROSCOPIC
Anesthesia: General

## 2018-10-16 MED ORDER — LIDOCAINE 2% (20 MG/ML) 5 ML SYRINGE
INTRAMUSCULAR | Status: DC | PRN
  Administered 2018-10-16: 100 mg via INTRAVENOUS

## 2018-10-16 MED ORDER — ESTRADIOL 0.1 MG/GM VA CREA
TOPICAL_CREAM | VAGINAL | Status: DC | PRN
  Administered 2018-10-16: 1 via VAGINAL

## 2018-10-16 MED ORDER — ACETAMINOPHEN 10 MG/ML IV SOLN
INTRAVENOUS | Status: AC
  Administered 2018-10-16: 1000 mg via INTRAVENOUS
  Filled 2018-10-16: qty 100

## 2018-10-16 MED ORDER — MIDAZOLAM HCL 5 MG/5ML IJ SOLN
INTRAMUSCULAR | Status: DC | PRN
  Administered 2018-10-16: 2 mg via INTRAVENOUS

## 2018-10-16 MED ORDER — LISINOPRIL 20 MG PO TABS
20.0000 mg | ORAL_TABLET | Freq: Every day | ORAL | Status: DC
  Administered 2018-10-17: 20 mg via ORAL
  Filled 2018-10-16: qty 1

## 2018-10-16 MED ORDER — KETOROLAC TROMETHAMINE 15 MG/ML IJ SOLN
INTRAMUSCULAR | Status: AC
  Administered 2018-10-16: 15 mg via INTRAVENOUS
  Filled 2018-10-16: qty 1

## 2018-10-16 MED ORDER — DEXAMETHASONE SODIUM PHOSPHATE 10 MG/ML IJ SOLN
INTRAMUSCULAR | Status: DC | PRN
  Administered 2018-10-16: 10 mg via INTRAVENOUS

## 2018-10-16 MED ORDER — ACETAMINOPHEN 10 MG/ML IV SOLN
1000.0000 mg | Freq: Four times a day (QID) | INTRAVENOUS | Status: AC
  Administered 2018-10-16 – 2018-10-17 (×4): 1000 mg via INTRAVENOUS
  Filled 2018-10-16 (×3): qty 100

## 2018-10-16 MED ORDER — DEXTROSE-NACL 5-0.45 % IV SOLN
INTRAVENOUS | Status: DC
  Administered 2018-10-16 – 2018-10-17 (×2): via INTRAVENOUS

## 2018-10-16 MED ORDER — ONDANSETRON HCL 4 MG/2ML IJ SOLN
INTRAMUSCULAR | Status: AC
  Filled 2018-10-16: qty 4

## 2018-10-16 MED ORDER — BUPIVACAINE-EPINEPHRINE 0.5% -1:200000 IJ SOLN
INTRAMUSCULAR | Status: AC
  Filled 2018-10-16: qty 1

## 2018-10-16 MED ORDER — LACTATED RINGERS IV SOLN: INTRAVENOUS | Status: DC

## 2018-10-16 MED ORDER — BUPIVACAINE-EPINEPHRINE 0.5% -1:200000 IJ SOLN
INTRAMUSCULAR | Status: DC | PRN
  Administered 2018-10-16: 50 mL

## 2018-10-16 MED ORDER — HYDROMORPHONE HCL 1 MG/ML IJ SOLN: 0.5000 mg | INTRAMUSCULAR | Status: DC | PRN

## 2018-10-16 MED ORDER — PROPOFOL 10 MG/ML IV BOLUS
INTRAVENOUS | Status: DC | PRN
  Administered 2018-10-16: 160 mg via INTRAVENOUS

## 2018-10-16 MED ORDER — KETOROLAC TROMETHAMINE 15 MG/ML IJ SOLN
15.0000 mg | Freq: Four times a day (QID) | INTRAMUSCULAR | Status: DC
  Administered 2018-10-16 – 2018-10-17 (×3): 15 mg via INTRAVENOUS
  Filled 2018-10-16 (×3): qty 1

## 2018-10-16 MED ORDER — BUPROPION HCL 75 MG PO TABS
75.0000 mg | ORAL_TABLET | Freq: Two times a day (BID) | ORAL | Status: DC
  Administered 2018-10-17: 75 mg via ORAL
  Filled 2018-10-16 (×2): qty 1

## 2018-10-16 MED ORDER — BUPIVACAINE LIPOSOME 1.3 % IJ SUSP
20.0000 mL | Freq: Once | INTRAMUSCULAR | Status: AC
  Administered 2018-10-16: 20 mL
  Filled 2018-10-16: qty 20

## 2018-10-16 MED ORDER — DEXAMETHASONE SODIUM PHOSPHATE 10 MG/ML IJ SOLN
INTRAMUSCULAR | Status: AC
  Filled 2018-10-16: qty 1

## 2018-10-16 MED ORDER — SCOPOLAMINE 1 MG/3DAYS TD PT72
MEDICATED_PATCH | TRANSDERMAL | Status: AC
  Filled 2018-10-16: qty 1

## 2018-10-16 MED ORDER — DIPHENHYDRAMINE HCL 12.5 MG/5ML PO ELIX: 12.5000 mg | ORAL_SOLUTION | Freq: Four times a day (QID) | ORAL | Status: DC | PRN

## 2018-10-16 MED ORDER — TRAMADOL HCL 50 MG PO TABS
50.0000 mg | ORAL_TABLET | Freq: Four times a day (QID) | ORAL | Status: DC | PRN
  Administered 2018-10-16 – 2018-10-17 (×2): 50 mg via ORAL
  Filled 2018-10-16 (×2): qty 1

## 2018-10-16 MED ORDER — CEFAZOLIN SODIUM-DEXTROSE 2-4 GM/100ML-% IV SOLN
INTRAVENOUS | Status: AC
  Filled 2018-10-16: qty 100

## 2018-10-16 MED ORDER — FENTANYL CITRATE (PF) 100 MCG/2ML IJ SOLN
INTRAMUSCULAR | Status: AC
  Filled 2018-10-16: qty 2

## 2018-10-16 MED ORDER — PROPOFOL 10 MG/ML IV BOLUS
INTRAVENOUS | Status: AC
  Filled 2018-10-16: qty 40

## 2018-10-16 MED ORDER — ROCURONIUM BROMIDE 10 MG/ML (PF) SYRINGE
PREFILLED_SYRINGE | INTRAVENOUS | Status: AC
  Filled 2018-10-16: qty 10

## 2018-10-16 MED ORDER — LIDOCAINE 2% (20 MG/ML) 5 ML SYRINGE
INTRAMUSCULAR | Status: AC
  Filled 2018-10-16: qty 5

## 2018-10-16 MED ORDER — CEFAZOLIN SODIUM-DEXTROSE 2-4 GM/100ML-% IV SOLN
2.0000 g | INTRAVENOUS | Status: AC
  Administered 2018-10-16: 2 g via INTRAVENOUS

## 2018-10-16 MED ORDER — PROMETHAZINE HCL 25 MG/ML IJ SOLN: 6.2500 mg | INTRAMUSCULAR | Status: DC | PRN

## 2018-10-16 MED ORDER — BELLADONNA ALKALOIDS-OPIUM 16.2-60 MG RE SUPP: 1.0000 | Freq: Four times a day (QID) | RECTAL | Status: DC | PRN

## 2018-10-16 MED ORDER — ONDANSETRON HCL 4 MG/2ML IJ SOLN
INTRAMUSCULAR | Status: AC
  Filled 2018-10-16: qty 2

## 2018-10-16 MED ORDER — FLEET ENEMA 7-19 GM/118ML RE ENEM
1.0000 | ENEMA | Freq: Once | RECTAL | Status: AC
  Administered 2018-10-16: 1 via RECTAL
  Filled 2018-10-16: qty 1

## 2018-10-16 MED ORDER — SUGAMMADEX SODIUM 200 MG/2ML IV SOLN
INTRAVENOUS | Status: DC | PRN
  Administered 2018-10-16: 150 mg via INTRAVENOUS

## 2018-10-16 MED ORDER — CEFAZOLIN SODIUM-DEXTROSE 1-4 GM/50ML-% IV SOLN
1.0000 g | Freq: Once | INTRAVENOUS | Status: AC
  Administered 2018-10-16: 1 g via INTRAVENOUS
  Filled 2018-10-16: qty 50

## 2018-10-16 MED ORDER — PHENYLEPHRINE 40 MCG/ML (10ML) SYRINGE FOR IV PUSH (FOR BLOOD PRESSURE SUPPORT)
PREFILLED_SYRINGE | INTRAVENOUS | Status: DC | PRN
  Administered 2018-10-16: 80 ug via INTRAVENOUS
  Administered 2018-10-16: 40 ug via INTRAVENOUS

## 2018-10-16 MED ORDER — FENTANYL CITRATE (PF) 100 MCG/2ML IJ SOLN
INTRAMUSCULAR | Status: DC | PRN
  Administered 2018-10-16: 75 ug via INTRAVENOUS
  Administered 2018-10-16: 25 ug via INTRAVENOUS
  Administered 2018-10-16: 50 ug via INTRAVENOUS
  Administered 2018-10-16 (×2): 25 ug via INTRAVENOUS

## 2018-10-16 MED ORDER — LACTATED RINGERS IV SOLN
INTRAVENOUS | Status: DC
  Administered 2018-10-16 (×2): via INTRAVENOUS

## 2018-10-16 MED ORDER — PHENYLEPHRINE 40 MCG/ML (10ML) SYRINGE FOR IV PUSH (FOR BLOOD PRESSURE SUPPORT)
PREFILLED_SYRINGE | INTRAVENOUS | Status: AC
  Filled 2018-10-16: qty 10

## 2018-10-16 MED ORDER — ESTRADIOL 0.1 MG/GM VA CREA
TOPICAL_CREAM | VAGINAL | Status: AC
  Filled 2018-10-16: qty 42.5

## 2018-10-16 MED ORDER — HYDROMORPHONE HCL 1 MG/ML IJ SOLN
INTRAMUSCULAR | Status: AC
  Administered 2018-10-16: 0.5 mg via INTRAVENOUS
  Filled 2018-10-16: qty 1

## 2018-10-16 MED ORDER — DIPHENHYDRAMINE HCL 50 MG/ML IJ SOLN: 12.5000 mg | Freq: Four times a day (QID) | INTRAMUSCULAR | Status: DC | PRN

## 2018-10-16 MED ORDER — HYDROMORPHONE HCL 1 MG/ML IJ SOLN
0.2500 mg | INTRAMUSCULAR | Status: DC | PRN
  Administered 2018-10-16: 0.5 mg via INTRAVENOUS

## 2018-10-16 MED ORDER — ROCURONIUM BROMIDE 10 MG/ML (PF) SYRINGE
PREFILLED_SYRINGE | INTRAVENOUS | Status: DC | PRN
  Administered 2018-10-16: 20 mg via INTRAVENOUS
  Administered 2018-10-16: 50 mg via INTRAVENOUS
  Administered 2018-10-16: 10 mg via INTRAVENOUS
  Administered 2018-10-16: 30 mg via INTRAVENOUS

## 2018-10-16 MED ORDER — ONDANSETRON HCL 4 MG/2ML IJ SOLN
INTRAMUSCULAR | Status: DC | PRN
  Administered 2018-10-16 (×2): 4 mg via INTRAVENOUS

## 2018-10-16 MED ORDER — SODIUM CHLORIDE 0.9 % IJ SOLN
INTRAMUSCULAR | Status: AC
  Filled 2018-10-16: qty 50

## 2018-10-16 MED ORDER — MIDAZOLAM HCL 2 MG/2ML IJ SOLN
INTRAMUSCULAR | Status: AC
  Filled 2018-10-16: qty 2

## 2018-10-16 MED ORDER — TRAMADOL HCL 50 MG PO TABS: 50.0000 mg | ORAL_TABLET | Freq: Four times a day (QID) | ORAL | 0 refills | Status: DC | PRN

## 2018-10-16 MED ORDER — ONDANSETRON HCL 4 MG/2ML IJ SOLN: 4.0000 mg | INTRAMUSCULAR | Status: DC | PRN

## 2018-10-16 MED ORDER — MEPERIDINE HCL 50 MG/ML IJ SOLN: 6.2500 mg | INTRAMUSCULAR | Status: DC | PRN

## 2018-10-16 MED ORDER — BENZONATATE 100 MG PO CAPS: 200.0000 mg | ORAL_CAPSULE | Freq: Three times a day (TID) | ORAL | Status: DC | PRN

## 2018-10-16 SURGICAL SUPPLY — 75 items
ADH SKN CLS APL DERMABOND .7 (GAUZE/BANDAGES/DRESSINGS) ×2
BAG SPEC RTRVL LRG 6X4 10 (ENDOMECHANICALS)
BAG URINE DRAINAGE (UROLOGICAL SUPPLIES) ×2 IMPLANT
CATH FOLEY 2WAY 5CC 16FR (CATHETERS)
CATH FOLEY 2WAY SLVR  5CC 16FR (CATHETERS) ×2
CATH FOLEY 2WAY SLVR 5CC 16FR (CATHETERS) IMPLANT
CATH FOLEY 3WAY 30CC 20FR (CATHETERS) ×2 IMPLANT
CATH URTH STD 16FR FL 2W DRN (CATHETERS) ×2 IMPLANT
CHLORAPREP W/TINT 26ML (MISCELLANEOUS) ×4 IMPLANT
CLIP VESOLOCK LG 6/CT PURPLE (CLIP) ×4 IMPLANT
CLIP VESOLOCK MED LG 6/CT (CLIP) IMPLANT
COVER SURGICAL LIGHT HANDLE (MISCELLANEOUS) ×4 IMPLANT
COVER TIP SHEARS 8 DVNC (MISCELLANEOUS) ×2 IMPLANT
COVER TIP SHEARS 8MM DA VINCI (MISCELLANEOUS) ×2
COVER WAND RF STERILE (DRAPES) ×2 IMPLANT
DECANTER SPIKE VIAL GLASS SM (MISCELLANEOUS) ×2 IMPLANT
DERMABOND ADVANCED (GAUZE/BANDAGES/DRESSINGS) ×2
DERMABOND ADVANCED .7 DNX12 (GAUZE/BANDAGES/DRESSINGS) ×2 IMPLANT
DRAPE ARM DVNC X/XI (DISPOSABLE) ×8 IMPLANT
DRAPE COLUMN DVNC XI (DISPOSABLE) ×2 IMPLANT
DRAPE DA VINCI XI ARM (DISPOSABLE) ×8
DRAPE DA VINCI XI COLUMN (DISPOSABLE) ×2
DRAPE INCISE IOBAN 66X45 STRL (DRAPES) ×4 IMPLANT
DRAPE SHEET LG 3/4 BI-LAMINATE (DRAPES) ×8 IMPLANT
DRAPE SURG IRRIG POUCH 19X23 (DRAPES) ×4 IMPLANT
ELECT PENCIL ROCKER SW 15FT (MISCELLANEOUS) ×4 IMPLANT
ELECT REM PT RETURN 15FT ADLT (MISCELLANEOUS) ×4 IMPLANT
GLOVE BIO SURGEON STRL SZ 6.5 (GLOVE) ×3 IMPLANT
GLOVE BIO SURGEONS STRL SZ 6.5 (GLOVE) ×1
GLOVE BIOGEL M STRL SZ7.5 (GLOVE) ×10 IMPLANT
GLOVE BIOGEL PI IND STRL 7.0 (GLOVE) IMPLANT
GLOVE BIOGEL PI INDICATOR 7.0 (GLOVE) ×4
GLOVE ECLIPSE 7.0 STRL STRAW (GLOVE) ×4 IMPLANT
GOWN STRL REUS W/ TWL XL LVL3 (GOWN DISPOSABLE) ×4 IMPLANT
GOWN STRL REUS W/TWL LRG LVL3 (GOWN DISPOSABLE) ×8 IMPLANT
GOWN STRL REUS W/TWL XL LVL3 (GOWN DISPOSABLE) ×8
HOLDER FOLEY CATH W/STRAP (MISCELLANEOUS) ×4 IMPLANT
IRRIG SUCT STRYKERFLOW 2 WTIP (MISCELLANEOUS) ×4
IRRIGATION SUCT STRKRFLW 2 WTP (MISCELLANEOUS) ×2 IMPLANT
IV LACTATED RINGERS 1000ML (IV SOLUTION) ×2 IMPLANT
KIT BASIN OR (CUSTOM PROCEDURE TRAY) ×4 IMPLANT
MANIPULATOR UTERINE 4.5 ZUMI (MISCELLANEOUS) IMPLANT
MARKER SKIN DUAL TIP RULER LAB (MISCELLANEOUS) ×4 IMPLANT
MESH Y UPSYLON VAGINAL (Mesh General) ×2 IMPLANT
OCCLUDER COLPOPNEUMO (BALLOONS) IMPLANT
PACKING VAGINAL (PACKING) ×4 IMPLANT
PAD POSITIONING PINK XL (MISCELLANEOUS) ×4 IMPLANT
PLUG CATH AND CAP STER (CATHETERS) ×4 IMPLANT
PORT ACCESS TROCAR AIRSEAL 12 (TROCAR) ×2 IMPLANT
PORT ACCESS TROCAR AIRSEAL 5M (TROCAR) ×2
POSITIONER SURGICAL ARM (MISCELLANEOUS) ×2 IMPLANT
POUCH SPECIMEN RETRIEVAL 10MM (ENDOMECHANICALS) IMPLANT
SCISSORS LAP 5X45 EPIX DISP (ENDOMECHANICALS) ×2 IMPLANT
SEAL CANN UNIV 5-8 DVNC XI (MISCELLANEOUS) ×8 IMPLANT
SEAL XI 5MM-8MM UNIVERSAL (MISCELLANEOUS) ×8
SET IRRIG Y TYPE TUR BLADDER L (SET/KITS/TRAYS/PACK) ×4 IMPLANT
SET TRI-LUMEN FLTR TB AIRSEAL (TUBING) ×4 IMPLANT
SHEET LAVH (DRAPES) ×4 IMPLANT
SOLUTION ELECTROLUBE (MISCELLANEOUS) ×4 IMPLANT
SUT MNCRL AB 4-0 PS2 18 (SUTURE) ×8 IMPLANT
SUT PROLENE 2 0 CT 1 (SUTURE) ×4 IMPLANT
SUT VIC AB 0 CT1 27 (SUTURE) ×4
SUT VIC AB 0 CT1 27XBRD ANTBC (SUTURE) ×2 IMPLANT
SUT VIC AB 2-0 SH 27 (SUTURE) ×20
SUT VIC AB 2-0 SH 27X BRD (SUTURE) ×10 IMPLANT
SUT VIC AB 3-0 SH 27 (SUTURE) ×4
SUT VIC AB 3-0 SH 27X BRD (SUTURE) IMPLANT
SUT VICRYL 0 UR6 27IN ABS (SUTURE) ×6 IMPLANT
SYR 30ML LL (SYRINGE) ×2 IMPLANT
SYR 50ML LL SCALE MARK (SYRINGE) IMPLANT
SYR BULB IRRIGATION 50ML (SYRINGE) IMPLANT
TOWEL OR 17X26 10 PK STRL BLUE (TOWEL DISPOSABLE) ×6 IMPLANT
TRAY LAPAROSCOPIC (CUSTOM PROCEDURE TRAY) ×4 IMPLANT
TROCAR XCEL 12X100 BLDLESS (ENDOMECHANICALS) ×2 IMPLANT
WATER STERILE IRR 1000ML POUR (IV SOLUTION) ×6 IMPLANT

## 2018-10-16 NOTE — H&P (Signed)
Pelvic organ prolapse  HPI: Gloria Werner is a 77 year-old female established patient who is here for further evaluation of her pelvic prolapse.  She does not have pelvic pain. Her symptoms have been worse over the last year.   She does have an abnormal sensation when she needs to urinate. She is urinating more frequently now than usual. She does not have a good size and strength to her urinary stream. She is not having problems with emptying her bladder well.   She is having problems with urinary control or incontinence. She does wear protective pads. She wears 3-4 pads per day. She can get to the bathroom in time when she gets the urge to urinate. She does not leak urine when she coughs, laughs, sneezes or bears down. The patient does not have a history of recurrent UTIs.   The patient does posture when she voids or defecates. The patient has or has had trouble with constipation.   The patient has a history of hysterectomy and bladder prolapse repair.   The patient has been seen and evaluated by Dr. Matilde Sprang. She has complete vault prolapse. He felt as if transvaginal repair would be insufficient for the extent of her prolapse. She had been managing this with a pessary, but noted some vaginal erosion because of the pessary.   The patient had urodynamics prior to our visit today which demonstrated no evidence of stress incontinence when the bladder and pelvic prolapse was reduced. She did have an unstable bladder and a fairly weak detrusor contraction. Her bladder capacity was normal.   The patient has no significant past medical history, she does have some hypertension, but is otherwise healthy. No history of heart attack or stroke. She is nondiabetic. She does complain of chronic cough, she's had this cough for the past 3 months.       ALLERGIES: Codeine Sulfate TABS - Nausea Mercury    MEDICATIONS: Lisinopril 10 mg tablet  Bupropion Hcl 75 mg tablet  Calcium TABS Oral   Diclofenac Potassium  Multivitamin  Vitamin B-6 100 MG Oral Tablet Oral  Vitamin D3     GU PSH: Complex cystometrogram, w/ void pressure and urethral pressure profile studies, any technique - 08/15/2018 Complex Uroflow - 08/15/2018 Emg surf Electrd - 08/15/2018 Hysterectomy Unilat SO - 2013, 2007, 1975 Inject For cystogram - 08/15/2018 Intrabd voidng Press - 08/15/2018      PSH Notes: Hysterectomy, Hysterectomy   NON-GU PSH: No Non-GU PSH    GU PMH: Cystocele, midline (Stable) - 07/30/2018, Cystocele, midline, - 2014 Mixed incontinence (Stable) - 07/30/2018, Urge and stress incontinence, - 2014 Nocturia (Stable) - 07/30/2018, Nocturia, - 2014 Urinary Frequency (Stable) - 07/30/2018, Increased urinary frequency, - 2014 Rectocele, Rectocele, female - 2014 Weak Urinary Stream, Weak urinary stream - 2014      PMH Notes:  1898-12-25 00:00:00 - Note: Normal Routine History And Physical Adult   NON-GU PMH: Arthritis Hypertension    FAMILY HISTORY: 1 Daughter - Daughter 1 son - Son Cancer - Runs In Family Family Health Status Number - Runs In Family   SOCIAL HISTORY: Marital Status: Married Current Smoking Status: Patient has never smoked.   Tobacco Use Assessment Completed: Used Tobacco in last 30 days? Drinks 4+ caffeinated drinks per day.     Notes: Never A Smoker, Occupation:, Tobacco Use, Alcohol Use   REVIEW OF SYSTEMS:    GU Review Female:   Patient denies frequent urination, hard to postpone urination, burning /pain with urination, get up  at night to urinate, leakage of urine, stream starts and stops, trouble starting your stream, have to strain to urinate, and being pregnant.  Gastrointestinal (Upper):   Patient denies vomiting, indigestion/ heartburn, and nausea.  Gastrointestinal (Lower):   Patient denies diarrhea and constipation.  Constitutional:   Patient denies fever, night sweats, weight loss, and fatigue.  Skin:   Patient denies skin rash/ lesion and itching.  Eyes:    Patient denies blurred vision and double vision.  Ears/ Nose/ Throat:   Patient denies sore throat and sinus problems.  Hematologic/Lymphatic:   Patient denies swollen glands and easy bruising.  Cardiovascular:   Patient denies leg swelling and chest pains.  Respiratory:   Patient denies cough and shortness of breath.  Endocrine:   Patient denies excessive thirst.  Musculoskeletal:   Patient denies back pain and joint pain.  Neurological:   Patient denies headaches and dizziness.  Psychologic:   Patient denies depression and anxiety.   VITAL SIGNS: None   GU PHYSICAL EXAMINATION:    External Genitalia: No hirsuitism, no rash, no scarring, no cyst, no erythematous lesion, no papular lesion, no blanched lesion, no warty lesion, no labial adhesions, no atrophic introitus. No edema.   Urethral Meatus: Normal size. Normal position. No discharge.  Urethra: No tenderness, no mass, no scarring. No hypermobility. No leakage.  Bladder: Normal to palpation, no tenderness, no mass, normal size.   Vagina: Complete pelvic prolapse. Posteriorly she has a small rectocele. Anteriorly she has full prolapse. There are still ulcerations on the anterior aspect of the vaginal mucosa from her pessary. These appear to be healing. There is no evidence of infection there.   MULTI-SYSTEM PHYSICAL EXAMINATION:       PAST DATA REVIEWED:  Source Of History:  Patient  Records Review:   Previous Doctor Records, Previous Patient Records, POC Tool  Urodynamics Review:   Review Urodynamics Tests   PROCEDURES: None   ASSESSMENT:      ICD-10 Details  1 GU:   Female genital prolapse, unspecified - N81.9    PLAN:           Document Letter(s):  Created for Patient: Clinical Summary         Notes:   I went over robotic-assisted laparoscopic sacral colpopexy with the patient detail. I explained to the patient the rationale for the surgery. I also went over the placement of the laparoscopic ports. I detailed to her the  surgery as well as the postoperative recovery time. I explained to the patient that she could expect to be in the hospital at least one or 2 nights. She will require 4 weeks of no heavy lifting, 6 weeks of no bending or twisting. She will not be able to use her vagina for 6 weeks. I discussed complications of the operation including injury to bowel, ureters, bladder. We also discussed the risk of failure as well as the complications of mesh. I explained to them the difference between transvaginal mesh and the mesh used for sacral colpopexy. I reassured them that there has not been an FDA warnings in regards to sacral colpopexy mesh. We will plan to get this prior to her surgery. I spent 45 minutes with the patient going over that ins and outs of the surgery and answering all her questions.   The patient had no evidence of stress urinary incontinence when her prolapse was reduced during the urodynamic evaluation. As such, we will plan not to perform a mid urethral sling at the  time of her procedure. We also discussed the fact that she could have a small persistent rectocele, which almost always is clinically insignificant and does not need to be repaired. If the patient does need a sling in the future or rectocele repair, we can do this in a delayed fashion without any significant detrimental impact.   The patient would like to get this taken care of as soon as possible.

## 2018-10-16 NOTE — Op Note (Signed)
Preoperative diagnosis:  1. Pelvic organ prolapse   Postoperative diagnosis:  1. same   Procedure: 1. Robotic assisted laparoscopic sacrocolpopexy  Surgeon: Ardis Hughs, MD 1st assistant: Debbrah Alar, PA-C Resident surgeon: Georgina Quint, MD  Anesthesia: General  Complications: None  Intraoperative findings: [employer] scientific Upsilon Y-Mesh placed, cut to specific vaginal length  EBL: 50cc  Specimens: None  Indication: Gloria Werner is a 77 y.o. female patient with symptomatic pelvic organ prolapse.  After reviewing the management options for treatment, he elected to proceed with the above surgical procedure(s). We have discussed the potential benefits and risks of the procedure, side effects of the proposed treatment, the likelihood of the patient achieving the goals of the procedure, and any potential problems that might occur during the procedure or recuperation. Informed consent has been obtained.  Description of procedure:  A Foley catheter was then placed and placed to gravity drainage. I then made a periumbilical incision carrying the dissection down to the patient's fascia with electrocautery. Once to the fascia, the fascia was incised the peritoneum opened. A 49mm port was then placed into the abdomen. The abdomen was insufflated and the remaining ports placed under digital guidance. 2 ports were placed lateral to the umbilicus on the right proximally 10 cm apart. The most lateral port was approximately 3 cm above the anterior iliac spine. 2 additional ports were placed in the patient's right side in comparable positions to the most lateral port on the right was a 12 mm port.the robot was then docked at an angle from the leg obliquely along the side of the left leg.  We then began our surgery by cleaning up some of the pelvic adhesions to the small bowel and colon. Once this was completed I started dissecting at the sacral promontory located 3 cm medial  to the location where the ureter crosses over the iliac vessels at the pelvic brim. The posterior peritoneum was incised and the sacral prominence cleared off an area taking care to avoid the middle sacral vessels and the iliac branches. I then created a posterior peritoneal tunnel starting at the sacral promontory and tunneling down the right pelvic sidewall down into the pelvis breaking back through the posterior peritoneum around the vesico-vaginal junction posteriorly. I then continued the posterior dissection retracting down on the rectum and finding the avascular plane between the posterior vaginal wall and the rectum. I carried this dissection down as far as I could to along the area of the perineal body.  I then turned my attention to the anterior plane between the anterior vaginal wall and the bladder. I was able to obtain access to the avascular plane and with a combination of both monopolar cautery and blunt dissection was able to clean and nice down to the bladder neck. I then turned my attention back to the patient's uterus and skeletonized the right uterine artery and vein and then took this with a series of bipolar moves. I then performed a similar uterus pedicle ligation on the left.   Mesh was measured at approximately 7cm anteriorly and 7 cm posteriorly and I cut this on the back table. The mesh was then placed into the patient's abdomen through the assistant port and the anterior leaf was secured down onto the anterior vaginal wall with the apex at the bladder neck. The posterior leaf was then secured down on the posterior vaginal wall. These were sewn down with 2-0 Vicryl. Between 6 and 8 were done on each side. At  this point I then went back to the previously dissected sacral promontory and posterior peritoneal tunnel and inserted a instrument through the tunnel and grasped the end of the mesh at the vaginal cuff and pull it up to the sacrum. I then checked to ensure that the  sacral mesh was not too tight by performing a vaginal exam. I then secured the sacral leg of the mesh using a 0 Prolene. I then reapproximated the posterior peritoneum with a 2-0 Vicryl in a running fashion around the sacral promontory. The pelvic peritoneum was closed using a pursestring. The fascia of the 12 mm port was then closed with 0 Vicryl in a figure-of-eight fashion. The skin was closed with 4-0 Monocryl's. Dermabond was applied to the incision and exparel injected into the incisions.  The patient was subsequently extubated and returned to the PACU in excellent condition.   Ardis Hughs, M.D.

## 2018-10-16 NOTE — Anesthesia Procedure Notes (Signed)
Procedure Name: Intubation Date/Time: 10/16/2018 7:31 AM Performed by: Lavina Hamman, CRNA Pre-anesthesia Checklist: Patient identified, Emergency Drugs available, Suction available, Patient being monitored and Timeout performed Patient Re-evaluated:Patient Re-evaluated prior to induction Oxygen Delivery Method: Circle system utilized Preoxygenation: Pre-oxygenation with 100% oxygen Induction Type: IV induction Ventilation: Mask ventilation without difficulty Laryngoscope Size: Mac and 4 Grade View: Grade II Tube type: Oral Tube size: 7.5 mm Number of attempts: 1 Airway Equipment and Method: Stylet Placement Confirmation: ETT inserted through vocal cords under direct vision,  positive ETCO2,  CO2 detector and breath sounds checked- equal and bilateral Secured at: 21 cm Tube secured with: Tape Dental Injury: Teeth and Oropharynx as per pre-operative assessment

## 2018-10-16 NOTE — Transfer of Care (Signed)
Immediate Anesthesia Transfer of Care Note  Patient: Gloria Werner  Procedure(s) Performed: Procedure(s): XI ROBOTIC ASSISTED LAPAROSCOPIC SACROCOLPOPEXY (N/A)  Patient Location: PACU  Anesthesia Type:General  Level of Consciousness:  sedated, patient cooperative and responds to stimulation  Airway & Oxygen Therapy:Patient Spontanous Breathing and Patient connected to face mask oxgen  Post-op Assessment:  Report given to PACU RN and Post -op Vital signs reviewed and stable  Post vital signs:  Reviewed and stable  Last Vitals:  Vitals:   10/16/18 0542  BP: (!) 181/85  Pulse: 92  Resp: 16  Temp: 36.7 C  SpO2: 972%    Complications: No apparent anesthesia complications

## 2018-10-16 NOTE — Anesthesia Postprocedure Evaluation (Signed)
Anesthesia Post Note  Patient: Gloria Werner  Procedure(s) Performed: XI ROBOTIC ASSISTED LAPAROSCOPIC SACROCOLPOPEXY (N/A ) INSERTION OF MESH     Patient location during evaluation: PACU Anesthesia Type: General Level of consciousness: awake and alert Pain management: pain level controlled Vital Signs Assessment: post-procedure vital signs reviewed and stable Respiratory status: spontaneous breathing, nonlabored ventilation, respiratory function stable and patient connected to nasal cannula oxygen Cardiovascular status: blood pressure returned to baseline and stable Postop Assessment: no apparent nausea or vomiting Anesthetic complications: no    Last Vitals:  Vitals:   10/16/18 1145 10/16/18 1203  BP: 130/73 (!) 143/68  Pulse: 77 78  Resp: 15 16  Temp: 36.5 C 36.5 C  SpO2: 98% 98%    Last Pain:  Vitals:   10/16/18 1203  TempSrc: Oral  PainSc:                  [name]

## 2018-10-16 NOTE — Discharge Instructions (Signed)

## 2018-10-16 NOTE — Anesthesia Preprocedure Evaluation (Addendum)
Anesthesia Evaluation  Patient identified by MRN, date of birth, ID band Patient awake    Reviewed: Allergy & Precautions, NPO status , Patient's Chart, lab work & pertinent test results  History of Anesthesia Complications (+) PONV  Airway Mallampati: II  TM Distance: >3 FB Neck ROM: Full    Dental  (+) Teeth Intact, Dental Advisory Given   Pulmonary neg pulmonary ROS,    breath sounds clear to auscultation       Cardiovascular hypertension, Pt. on medications  Rhythm:Regular Rate:Normal     Neuro/Psych Depression negative neurological ROS     GI/Hepatic Neg liver ROS, GERD  Medicated,  Endo/Other  negative endocrine ROS  Renal/GU negative Renal ROS     Musculoskeletal negative musculoskeletal ROS (+)   Abdominal Normal abdominal exam  (+)   Peds  Hematology negative hematology ROS (+)   Anesthesia Other Findings   Reproductive/Obstetrics                            Lab Results  Component Value Date   WBC 5.6 10/01/2018   HGB 12.8 10/01/2018   HCT 38.5 10/01/2018   MCV 89.5 10/01/2018   PLT 197.0 10/01/2018   Lab Results  Component Value Date   CREATININE 0.74 10/14/2018   BUN 17 10/14/2018   NA 140 10/14/2018   K 4.3 10/14/2018   CL 106 10/14/2018   CO2 26 10/14/2018   No results found for: INR, PROTIME  EKG: normal sinus rhythm.  Anesthesia Physical Anesthesia Plan  ASA: III  Anesthesia Plan: General   Post-op Pain Management:    Induction: Intravenous  PONV Risk Score and Plan: 4 or greater and Ondansetron, Dexamethasone and Treatment may vary due to age or medical condition  Airway Management Planned: Oral ETT  Additional Equipment: None  Intra-op Plan:   Post-operative Plan: Extubation in OR  Informed Consent: I have reviewed the patients History and Physical, chart, labs and discussed the procedure including the risks, benefits and alternatives for  the proposed anesthesia with the patient or authorized representative who has indicated his/her understanding and acceptance.   Dental advisory given  Plan Discussed with: CRNA  Anesthesia Plan Comments:        Anesthesia Quick Evaluation

## 2018-10-17 ENCOUNTER — Encounter (HOSPITAL_COMMUNITY): Payer: Self-pay | Admitting: Urology

## 2018-10-17 DIAGNOSIS — M8580 Other specified disorders of bone density and structure, unspecified site: Secondary | ICD-10-CM | POA: Diagnosis not present

## 2018-10-17 DIAGNOSIS — I10 Essential (primary) hypertension: Secondary | ICD-10-CM | POA: Diagnosis not present

## 2018-10-17 DIAGNOSIS — N8189 Other female genital prolapse: Secondary | ICD-10-CM | POA: Diagnosis not present

## 2018-10-17 DIAGNOSIS — K219 Gastro-esophageal reflux disease without esophagitis: Secondary | ICD-10-CM | POA: Diagnosis not present

## 2018-10-17 DIAGNOSIS — K589 Irritable bowel syndrome without diarrhea: Secondary | ICD-10-CM | POA: Diagnosis not present

## 2018-10-17 DIAGNOSIS — K5900 Constipation, unspecified: Secondary | ICD-10-CM | POA: Diagnosis not present

## 2018-10-17 DIAGNOSIS — Z79899 Other long term (current) drug therapy: Secondary | ICD-10-CM | POA: Diagnosis not present

## 2018-10-17 LAB — BASIC METABOLIC PANEL
ANION GAP: 7 (ref 5–15)
BUN: 15 mg/dL (ref 8–23)
CO2: 25 mmol/L (ref 22–32)
Calcium: 8.4 mg/dL — ABNORMAL LOW (ref 8.9–10.3)
Chloride: 106 mmol/L (ref 98–111)
Creatinine, Ser: 0.89 mg/dL (ref 0.44–1.00)
GFR calc Af Amer: >60 mL/min (ref >60)
GFR calc non Af Amer: >60 mL/min (ref >60)
GLUCOSE: 103 mg/dL — AB (ref 70–99)
POTASSIUM: 3.9 mmol/L (ref 3.5–5.1)
Sodium: 138 mmol/L (ref 135–145)

## 2018-10-17 LAB — HEMOGLOBIN AND HEMATOCRIT, BLOOD
HEMATOCRIT: 34.7 % — AB (ref 36.0–46.0)
Hemoglobin: 10.8 g/dL — ABNORMAL LOW (ref 12.0–15.0)

## 2018-10-17 NOTE — Discharge Summary (Signed)
Date of admission: 10/16/2018  Date of discharge: 10/17/2018  Admission diagnosis: cystocele/pelvic prolapse  Discharge diagnosis: same  Secondary diagnoses:  Patient Active Problem List   Diagnosis Date Noted  . Cystocele with prolapse 10/16/2018  . Vaginal prolapse 09/24/2018  . Cough 09/24/2018  . GERD (gastroesophageal reflux disease) 09/04/2018  . Plantar fascial fibromatosis of left foot 08/22/2018  . Pedal edema 08/22/2018  . De Quervain's syndrome (tenosynovitis) 07/03/2018  . Essential hypertension 06/03/2018  . Seasonal affective disorder ([place]) 01/17/2017  . Lichen simplex chronicus 04/08/2015  . Osteopenia 04/08/2015  . Depression 03/05/2015  . ALLERGIC RHINITIS, SEASONAL 02/03/2008  . IRRITABLE BOWEL SYNDROME 02/03/2008    Procedures performed: Procedure(s): XI ROBOTIC ASSISTED LAPAROSCOPIC SACROCOLPOPEXY INSERTION OF MESH  History and Physical: For full details, please see admission history and physical. Briefly, Gloria Werner is a 77 y.o. year old patient with symptomatic pelvic prolapse.   Hospital Course: Patient tolerated the procedure well.  She was then transferred to the floor after an uneventful PACU stay.  Her hospital course was uncomplicated.  On POD#1 she had met discharge criteria: was eating a regular diet, was up and ambulating independently,  pain was well controlled, was voiding without a catheter, and was ready to for discharge.  NAD Vitals:   10/16/18 1203 10/16/18 2036 10/17/18 0308 10/17/18 0543  BP: (!) 143/68 124/68 124/66 125/70  Pulse: 78 76 71 74  Resp: _0 Temp: 97.7 F (36.5 C) 98.3 F (36.8 C) 98.1 F (36.7 C) 97.8 F (36.6 C)  TempSrc: Oral Oral Oral Oral  SpO2: 98% 95% 96% 97%  Weight:      Height:      non-labored breathing Abdomen soft, incisions c/d/i Extremities are symmetric     Laboratory values:  Recent Labs    10/16/18 1126 10/17/18 0514  HGB 12.3 10.8*  HCT 40.1 34.7*   Recent Labs    10/14/18 0921 10/17/18 0514  NA 140 138  K 4.3 3.9  CL 106 106  CO2 26 25  GLUCOSE 109* 103*  BUN 17 15  CREATININE 0.74 0.89  CALCIUM 9.3 8.4*   No results for input(s): LABPT, INR in the last 72 hours. No results for input(s): LABURIN in the last 72 hours. Results for orders placed or performed during the hospital encounter of 10/14/18  Urine culture     Status: Abnormal   Collection Time: 10/14/18  8:31 AM  Result Value Ref Range Status   Specimen Description   Final    URINE, CLEAN CATCH Performed at [facility], [place] [address], [place], [place] [address]    Special Requests   Final    NONE Performed at [facility], [place] [address] [place], [place] [address]    Culture (A)  Final    20,000 COLONIES/mL GROUP B STREP(S.AGALACTIAE)ISOLATED TESTING AGAINST S. AGALACTIAE NOT ROUTINELY PERFORMED DUE TO PREDICTABILITY OF AMP/PEN/VAN SUSCEPTIBILITY. Performed at [facility], [place] [address] [place], [place] [address]    Report Status 10/15/2018 FINAL  Final    Disposition: Home  Discharge instruction: The patient was instructed to be ambulatory but told to refrain from heavy lifting, strenuous activity, or driving.   Discharge medications:  Allergies as of 10/17/2018      Reactions   Codeine Nausea And Vomiting   Lexapro [escitalopram] Diarrhea, Other (See Comments)   headache      Medication List    STOP taking these medications  CALCIUM 600 + D PO   HYDROcodone-homatropine 5-1.5 MG/5ML syrup Commonly known as:  HYCODAN   ibuprofen 200 MG tablet Commonly known as:  ADVIL,MOTRIN   metroNIDAZOLE 0.75 % vaginal gel Commonly known as:  METROGEL   multivitamin tablet   naproxen sodium 220 MG tablet Commonly known as:  ALEVE   pyridOXINE 100 MG tablet Commonly known as:  VITAMIN B-6   vitamin C 500 MG tablet Commonly known as:  ASCORBIC ACID   Vitamin D (Ergocalciferol) 50000 units Caps capsule Commonly  known as:  DRISDOL     TAKE these medications   azelastine 0.1 % nasal spray Commonly known as:  ASTELIN Place 1 spray into both nostrils 2 (two) times daily. Use in each nostril as directed What changed:    when to take this  reasons to take this   benzonatate 200 MG capsule Commonly known as:  TESSALON Take 1 capsule (200 mg total) by mouth 3 (three) times daily as needed for cough.   buPROPion 75 MG tablet Commonly known as:  WELLBUTRIN Take 1 tablet (75 mg total) by mouth 2 (two) times daily.   chlorpheniramine 4 MG tablet Commonly known as:  CHLOR-TRIMETON Take 2 tablets (8 mg total) by mouth 3 (three) times daily.   conjugated estrogens vaginal cream Commonly known as:  PREMARIN Use 1/2 g vaginally every other night for the next 2 weeks, then 2 x a week at hs What changed:    how much to take  how to take this  when to take this  additional instructions   levocetirizine 5 MG tablet Commonly known as:  XYZAL Take 1 tablet (5 mg total) by mouth every evening. What changed:    when to take this  reasons to take this   lisinopril 20 MG tablet Commonly known as:  PRINIVIL,ZESTRIL Take 1 tablet (20 mg total) by mouth daily.   LUBRICATING EYE DROPS OP Place 1 drop into both eyes daily as needed (dry eyes).   ranitidine 150 MG tablet Commonly known as:  ZANTAC Take 1 tablet (150 mg total) by mouth 2 (two) times daily.   saccharomyces boulardii 250 MG capsule Commonly known as:  FLORASTOR Take 2 capsules (500 mg total) by mouth 2 (two) times daily.   traMADol 50 MG tablet Commonly known as:  ULTRAM Take 1-2 tablets (50-100 mg total) by mouth every 6 (six) hours as needed for moderate pain or severe pain.   triamcinolone 55 MCG/ACT Aero nasal inhaler Commonly known as:  NASACORT Place 2 sprays into the nose daily.       Followup:  Follow-up Information    Ardis Hughs, MD On 10/31/2018.   Specialty:  Urology Why:  at 2:15 Contact  information: [place] [address] [phone]

## 2018-10-21 ENCOUNTER — Telehealth: Payer: Self-pay | Admitting: Pulmonary Disease

## 2018-10-21 NOTE — Telephone Encounter (Signed)
Notes recorded by Marshell Garfinkel, MD on 10/21/2018 at 11:32 AM EDT Labs are normal  Spoke with patient. She is aware of lab results. Verbalized understanding. Nothing further needed at time of call.

## 2018-10-30 DIAGNOSIS — X32XXXD Exposure to sunlight, subsequent encounter: Secondary | ICD-10-CM | POA: Diagnosis not present

## 2018-10-30 DIAGNOSIS — L570 Actinic keratosis: Secondary | ICD-10-CM | POA: Diagnosis not present

## 2018-10-30 DIAGNOSIS — L820 Inflamed seborrheic keratosis: Secondary | ICD-10-CM | POA: Diagnosis not present

## 2018-10-30 DIAGNOSIS — L821 Other seborrheic keratosis: Secondary | ICD-10-CM | POA: Diagnosis not present

## 2018-10-31 DIAGNOSIS — N8110 Cystocele, unspecified: Secondary | ICD-10-CM | POA: Diagnosis not present

## 2018-11-06 ENCOUNTER — Encounter: Payer: Self-pay | Admitting: Family Medicine

## 2018-11-06 ENCOUNTER — Ambulatory Visit: Payer: Medicare Other | Admitting: Family Medicine

## 2018-11-06 VITALS — BP 136/86 | HR 78 | Temp 98.4°F | Ht 60.0 in | Wt 142.2 lb

## 2018-11-06 DIAGNOSIS — J309 Allergic rhinitis, unspecified: Secondary | ICD-10-CM | POA: Diagnosis not present

## 2018-11-06 DIAGNOSIS — R05 Cough: Secondary | ICD-10-CM | POA: Diagnosis not present

## 2018-11-06 DIAGNOSIS — R059 Cough, unspecified: Secondary | ICD-10-CM

## 2018-11-06 MED ORDER — AZELASTINE HCL 0.1 % NA SOLN: 1.0000 | Freq: Two times a day (BID) | NASAL | 1 refills | Status: DC | PRN

## 2018-11-06 MED ORDER — LEVOCETIRIZINE DIHYDROCHLORIDE 5 MG PO TABS: 5.0000 mg | ORAL_TABLET | Freq: Every evening | ORAL | 3 refills | Status: DC

## 2018-11-06 MED ORDER — AMLODIPINE BESYLATE 5 MG PO TABS: 5.0000 mg | ORAL_TABLET | Freq: Every day | ORAL | 3 refills | Status: DC

## 2018-11-06 NOTE — Progress Notes (Signed)
Subjective:   Patient ID: Gloria Werner, female    DOB: Mar 23, 1941, 77 y.o.   MRN: [id number]  Gloria Werner is a pleasant 77 y.o. year old female who presents to clinic today with Follow-up (Patient is here today for a 4-week-F/U.  She was last here on 10.16.19 for a cough that was persistent.  This was Tx aggressively for post-nasal drip and for her to F/U after her surgery.  She still has the persistent cough and is very worried that it could cause issues with her prolapse surgery.  She has not taken her Lisinopril for a couple of days.)  on 11/06/2018  HPI:  Cough- last her for this on 10/09/18- note reviewed.   Ordered a CXR and referred her to pulmonary   Saw pulmonary, Dr. Vaughan Browner on 10/01/18- note reviewed- His assessment and plan was as follows:   Chronic cough Likely has upper airway cough from postnasal drip.  SuspectACEIisplayinga big role in her symptoms her symptoms started shortly after initiationof therapy. Although she was given a break of 4 weeks offlisinopril,this may not be long enough to improve symptoms as cough can be self-perpetuating and persistentonce started.Suspicion for intrinsic lung problems such as asthma and COPDis low. No evidence of ILD. She has been adequately treated for GERD with no improvement in the past.  Get in touch with her primary to see if she can come off lisinopril altogether For now we will beaggressive with postnasal drip I will start first generation antihistamine with chlorpheniramine 8 mg 3 times daily, increase Nasacort to twice daily Start Astelin nasal spray. Cough suppression with Hycodan. Continue Tessalon and Delsym as needed. Check CBC with diff, IgE and PFTs  I educated her on behavioral changes to deal with cough including conscious suppression of the urge to cough, use of throat lozenges.  She wanted to hold off on stopping Lisinopril until after her surgery.  She had surgery on 10/16/18  and has stopped her lisinopril a few days ago.  Also increased Tessalon to 200 mg three times daily.   At her initial appointment on 08/22/18, she had been on amlodipine for months as lisinopril was stopped due to a chronic cough.  The cough persisted on amlodipine and the amlodipine caused LE edema so we put her back on ACEI.  Also added treatment for postnasal drip with Claritin, Zyrtec, Xyzal, Nasacort for acid reflux with H2 blocker with no improvement. She is given PPI but did not start out of concern for C. difficile.   She is still having cough when she lies down but her husband says she coughs "all day long."  She stopped using her nasal spray and zyzal. Current Outpatient Medications on File Prior to Visit  Medication Sig Dispense Refill  . benzonatate (TESSALON) 200 MG capsule Take 1 capsule (200 mg total) by mouth 3 (three) times daily as needed for cough. 60 capsule 0  . buPROPion (WELLBUTRIN) 75 MG tablet Take 1 tablet (75 mg total) by mouth 2 (two) times daily. 180 tablet 3  . Carboxymethylcellul-Glycerin (LUBRICATING EYE DROPS OP) Place 1 drop into both eyes daily as needed (dry eyes).    . conjugated estrogens (PREMARIN) vaginal cream Use 1/2 g vaginally every other night for the next 2 weeks, then 2 x a week at hs (Patient taking differently: Place 0.5 Applicatorfuls vaginally every 14 (fourteen) days. ) 30 g 1  . saccharomyces boulardii (FLORASTOR) 250 MG capsule Take 2 capsules (500 mg total) by mouth 2 (  two) times daily. 120 capsule 3  . traMADol (ULTRAM) 50 MG tablet Take 1-2 tablets (50-100 mg total) by mouth every 6 (six) hours as needed for moderate pain or severe pain. 30 tablet 0  . triamcinolone (NASACORT) 55 MCG/ACT AERO nasal inhaler Place 2 sprays into the nose daily.     No current facility-administered medications on file prior to visit.     Allergies  Allergen Reactions  . Ace Inhibitors Cough  . Codeine Nausea And Vomiting  . Lexapro [Escitalopram]  Diarrhea and Other (See Comments)    headache    Past Medical History:  Diagnosis Date  . Allergic rhinitis due to pollen   . Complication of anesthesia   . Irritable bowel syndrome   . 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 PONV (postoperative nausea and vomiting)     Past Surgical History:  Procedure Laterality Date  . ABDOMINAL HYSTERECTOMY  1988   secondary to prolapse  . BLADDER SUSPENSION     A-P with Hyst  . INSERTION OF MESH  10/16/2018   Procedure: INSERTION OF MESH;  Surgeon: Ardis Hughs, MD;  Location: [facility];  Service: Urology;;  . NASAL SINUS SURGERY  1970  . ROBOTIC ASSISTED LAPAROSCOPIC SACROCOLPOPEXY N/A 10/16/2018   Procedure: XI ROBOTIC ASSISTED LAPAROSCOPIC SACROCOLPOPEXY;  Surgeon: Ardis Hughs, MD;  Location: [facility];  Service: Urology;  Laterality: N/A;  . TONSILLECTOMY      Family History  Problem Relation Age of Onset  . Emphysema Mother   . Lymphoma Mother   . Asthma Mother   . Cancer Father 51       lung cancer  . Coronary artery disease Brother   . Cancer Brother        breast cancer  . Cancer Brother        lung cancer  . Diabetes Neg Hx   . Hypertension Neg Hx   . Colon cancer Neg Hx     Social History   Socioeconomic History  . Marital status: Married    Spouse name: Not on file  . Number of children: 2  . Years of education: 34  . Highest education level: Not on file  Occupational History  . Occupation: Environmental health practitioner  Social Needs  . Financial resource strain: Not on file  . Food insecurity:    Worry: Not on file    Inability: Not on file  . Transportation needs:    Medical: Not on file    Non-medical: Not on file  Tobacco Use  . Smoking status: Never Smoker  . Smokeless tobacco: Never Used  Substance and Sexual Activity  . Alcohol use: No  . Drug use: No  . Sexual activity: Yes    Partners: Male    Birth control/protection: Surgical  Lifestyle  . Physical activity:    Days per week: Not on file     Minutes per session: Not on file  . Stress: Not on file  Relationships  . Social connections:    Talks on phone: Not on file    Gets together: Not on file    Attends religious service: Not on file    Active member of club or organization: Not on file    Attends meetings of clubs or organizations: Not on file    Relationship status: Not on file  . Intimate partner violence:    Fear of current or ex partner: Not on file    Emotionally abused: Not on file  Physically abused: Not on file    Forced sexual activity: Not on file  Other Topics Concern  . Not on file  Social History Narrative   HSG. Married '63. 1 son- 64'; 1-daughter-'68; grandchildren 3. 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 Marriage in good health. 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 Provided lead to [email or web address].   The PMH, PSH, Social History, Family History, Medications, and allergies have been reviewed in [facility], and have been updated if relevant.   Review of Systems  Constitutional: Negative.   HENT: Positive for postnasal drip and rhinorrhea. Negative for congestion, dental problem, drooling, ear discharge, ear pain, facial swelling, hearing loss, mouth sores and nosebleeds.   Respiratory: Positive for cough. Negative for shortness of breath, wheezing and stridor.   Cardiovascular: Negative.   Gastrointestinal: Negative.   Musculoskeletal: Negative.   Neurological: Negative.   Hematological: Negative.   Psychiatric/Behavioral: Negative.   All other systems reviewed and are negative.      Objective:    BP 136/86 (BP Location: Left Arm, Patient Position: Sitting, Cuff Size: Normal)   Pulse 78   Temp 98.4 F (36.9 C) (Oral)   Ht 5' (1.524 m)   Wt 142 lb 3.2 oz (64.5 kg)   LMP 12/25/1986 (Approximate)   SpO2 98%   BMI 27.77 kg/m    Physical Exam  Constitutional: She is oriented to person, place, and time. She appears  well-developed and well-nourished. No distress.  HENT:  Head: Normocephalic and atraumatic.  +PND  Eyes: EOM are normal.  Neck: Normal range of motion.  Cardiovascular: Normal rate and regular rhythm.  Pulmonary/Chest: Effort normal and breath sounds normal.  Musculoskeletal: Normal range of motion. She exhibits no edema.  Neurological: She is alert and oriented to person, place, and time. No cranial nerve deficit.  Skin: Skin is warm and dry. She is not diaphoretic.  Psychiatric: She has a normal mood and affect. Her behavior is normal. Judgment and thought content normal.  Nursing note and vitals reviewed.         Assessment & Plan:   Cough  Allergic rhinitis, unspecified seasonality, unspecified trigger No follow-ups on file.

## 2018-11-06 NOTE — Assessment & Plan Note (Signed)
>  25 minutes spent in face to face time with patient, >50% spent in counselling or coordination of care I do think this is multifactorial- night time cough likely is due to PND- advised to restart xyzal and astelin. Also worth stopping ACEI (added to allergy list) and start norvasc 5 mg daily. She has tolerated norvasc well in past. She will check BP at home 3 times weekly and update me in 2 weeks. Sooner if she has any concerns. The patient indicates understanding of these issues and agrees with the plan.

## 2018-11-06 NOTE — Patient Instructions (Addendum)
Great to see you. We are STOPPING lisinopril and start Norvasc 5 mg daily. Restart your allergy medications. Please check your blood pressure over the next two week and me updated.

## 2018-11-13 ENCOUNTER — Telehealth: Payer: Self-pay | Admitting: Family Medicine

## 2018-11-13 DIAGNOSIS — R059 Cough, unspecified: Secondary | ICD-10-CM

## 2018-11-13 DIAGNOSIS — R05 Cough: Secondary | ICD-10-CM

## 2018-11-13 DIAGNOSIS — J309 Allergic rhinitis, unspecified: Secondary | ICD-10-CM

## 2018-11-13 NOTE — Telephone Encounter (Signed)
Copied from [place] [id number]. Topic: Referral - Request for Referral >> Nov 13, 2018  3:57 PM Margot Ables wrote: Has patient seen PCP for this complaint? Yes OV 11/06/18 *If NO, is insurance requiring patient see PCP for this issue before PCP can refer them? Referral for which specialty: ENT Preferred provider/office: [place] area Reason for referral: pt stating she has chronic cough and no change since OV when her BP medication was changed. Pt thinking ENT would be a good idea since she's had this for 5 months+.

## 2018-11-14 NOTE — Telephone Encounter (Signed)
How has her blood pressure been since we stopped her ACE inhibitor? She already has a pulmonologist, Dr. Vaughan Browner.  She last saw him last month.  .  The nodule had not changed in size since 2016 which is reassuring. I think she must be asking for an ENT referral since she already has a pulmonologist.  I will place referral to ENT.  Please let me know if I misunderstood.

## 2018-11-14 NOTE — Telephone Encounter (Signed)
TA-Pt is still having a cough/in Sept CT Abd/Pelvis showed 18mm benign nodule/she is asking for a referral to ENT/unsuer if she needs that or Pulmonology/plz advise/thx dmf

## 2018-11-15 NOTE — Telephone Encounter (Signed)
Pt stated her BP is running around 142/83 since she stop lisinopril 2 wks ago. She still has some cough but she wants to try nasal saline rinse first to see if it will help with the coughing before she schedule with the ENT. She confirm ENT referral that she requested.   She also wants to know if she should still follow up with pulmonary because he is trying to find out reason for this cough as well since ENT referral is placed.  Dr. Deborra Medina please advise.

## 2018-11-15 NOTE — Telephone Encounter (Signed)
Yes she should call to schedule an appointment with pulmonary.  Please update [name] with her blood pressure at the end of next week, sooner if they start running higher than 145/90s (sustained).

## 2018-11-18 NOTE — Telephone Encounter (Signed)
She will update [name] with her readings by the end of the week. She has an appointment scheduled with Pulmonary on 11/27/18

## 2018-11-25 ENCOUNTER — Encounter: Payer: Self-pay | Admitting: Family Medicine

## 2018-11-26 ENCOUNTER — Telehealth: Payer: Self-pay | Admitting: *Deleted

## 2018-11-26 NOTE — Telephone Encounter (Signed)
That's a great reading.  How is feeling?

## 2018-11-26 NOTE — Telephone Encounter (Signed)
Patient doing well-Has an appointment with pulmonary scheduled for tomorrow.

## 2018-11-26 NOTE — Telephone Encounter (Signed)
Copied from [place] [id number]. Topic: General - Other >> Nov 20, 2018  3:52 PM Virl Axe D wrote: Reason for CRM: PT called to let report BP per Dr. Deborra Medina instruction. It was 135/76.

## 2018-11-27 ENCOUNTER — Encounter: Payer: Self-pay | Admitting: Pulmonary Disease

## 2018-11-27 ENCOUNTER — Ambulatory Visit (INDEPENDENT_AMBULATORY_CARE_PROVIDER_SITE_OTHER): Payer: Medicare Other | Admitting: Pulmonary Disease

## 2018-11-27 ENCOUNTER — Ambulatory Visit: Payer: Medicare Other | Admitting: Pulmonary Disease

## 2018-11-27 VITALS — BP 130/78 | HR 85 | Ht 59.75 in | Wt 142.0 lb

## 2018-11-27 DIAGNOSIS — R059 Cough, unspecified: Secondary | ICD-10-CM

## 2018-11-27 DIAGNOSIS — R05 Cough: Secondary | ICD-10-CM

## 2018-11-27 DIAGNOSIS — R0683 Snoring: Secondary | ICD-10-CM | POA: Diagnosis not present

## 2018-11-27 LAB — PULMONARY FUNCTION TEST
DL/VA % pred: 104 %
DL/VA: 4.38 ml/min/mmHg/L
DLCO COR % PRED: 112 %
DLCO cor: 20.91 ml/min/mmHg
DLCO unc % pred: 112 %
DLCO unc: 20.78 ml/min/mmHg
FEF 25-75 Post: 3.23 L/sec
FEF 25-75 Pre: 2.33 L/sec
FEF2575-%Change-Post: 38 %
FEF2575-%Pred-Post: 219 %
FEF2575-%Pred-Pre: 158 %
FEV1-%CHANGE-POST: 4 %
FEV1-%PRED-POST: 145 %
FEV1-%Pred-Pre: 139 %
FEV1-POST: 2.56 L
FEV1-PRE: 2.44 L
FEV1FVC-%Change-Post: 5 %
FEV1FVC-%PRED-PRE: 107 %
FEV6-%Change-Post: 0 %
FEV6-%PRED-POST: 135 %
FEV6-%PRED-PRE: 135 %
FEV6-POST: 3.02 L
FEV6-Pre: 3.03 L
FEV6FVC-%Change-Post: 0 %
FEV6FVC-%PRED-POST: 105 %
FEV6FVC-%PRED-PRE: 105 %
FVC-%CHANGE-POST: 0 %
FVC-%PRED-POST: 128 %
FVC-%Pred-Pre: 129 %
FVC-PRE: 3.04 L
FVC-Post: 3.02 L
POST FEV6/FVC RATIO: 100 %
PRE FEV6/FVC RATIO: 100 %
Post FEV1/FVC ratio: 85 %
Pre FEV1/FVC ratio: 80 %
RV % PRED: 117 %
RV: 2.41 L
TLC % PRED: 121 %
TLC: 5.36 L

## 2018-11-27 MED ORDER — BUDESONIDE-FORMOTEROL FUMARATE 160-4.5 MCG/ACT IN AERO: 2.0000 | INHALATION_SPRAY | Freq: Two times a day (BID) | RESPIRATORY_TRACT | 0 refills | Status: DC

## 2018-11-27 NOTE — Patient Instructions (Signed)
I am glad that your cough is improved.  However as you continue to have some persistent residual symptoms we will give a sample of an inhaler.  Use this for 2 weeks.  If this improves symptoms then give [name] a call and we will call in a prescription  Reviewed ENT evaluation Schedule for sleep study.

## 2018-11-27 NOTE — Progress Notes (Addendum)
Gloria Werner    [id number]    [birth year]  Primary Care Physician:Aron, Marciano Sequin, MD  Referring Physician: Lucille Passy, MD [place], [place] [address]  Chief complaint: Follow up for cough  HPI: 77 year old with history of hypertension.  Complains of chronic cough for the past 5 months.  She was started on lisinopril 06/03/2018 and notes she started developing cough within 2 to 4 days after this. She was taken off lisinopril for 4 weeks but the cough did not improve and hence lisinopril was restarted Tried treatment for postnasal drip with Claritin, Zyrtec, Xyzal, Nasacort for acid reflux with H2 blocker with no improvement.  She is given PPI but did not start out of concern for C. difficile.  She is scheduled for surgery for uterine prolapse on 10/21 and needs to get rid of the cough before that.  Chief complaint is chronic cough with clear mucus, postnasal drip, irritation of the back of the throat and need to constantly clear her throat.  Denies any acid reflux, dyspnea, wheezing, fevers, chills.  Pets: Has a dog, no cats, birds, farm animals. 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 Exposures: No mold, hot tub, Jacuzzi, humidifier Smoking history: Never smoker Travel history: No significant travel Relevant family history: Mother had emphysema, asthma.  Interim history: Taken off lisinopril and started on Norvasc by primary care She has tried chlorpheniramine and Flonase nasal spray.  States that cough has improved somewhat but still has some persistent symptoms Referred to ENT for further evaluation  Outpatient Encounter Medications as of 11/27/2018  Medication Sig  . amLODipine (NORVASC) 5 MG tablet Take 1 tablet (5 mg total) by mouth daily.  Marland Kitchen azelastine (ASTELIN) 0.1 % nasal spray Place 1 spray into both nostrils 2 (two) times daily as needed for rhinitis or allergies. Use in each nostril as directed  . benzonatate  (TESSALON) 200 MG capsule Take 1 capsule (200 mg total) by mouth 3 (three) times daily as needed for cough.  Marland Kitchen buPROPion (WELLBUTRIN) 75 MG tablet Take 1 tablet (75 mg total) by mouth 2 (two) times daily.  . Carboxymethylcellul-Glycerin (LUBRICATING EYE DROPS OP) Place 1 drop into both eyes daily as needed (dry eyes).  . conjugated estrogens (PREMARIN) vaginal cream Use 1/2 g vaginally every other night for the next 2 weeks, then 2 x a week at hs (Patient taking differently: Place 0.5 Applicatorfuls vaginally every 14 (fourteen) days. )  . levocetirizine (XYZAL) 5 MG tablet Take 1 tablet (5 mg total) by mouth every evening.  . saccharomyces boulardii (FLORASTOR) 250 MG capsule Take 2 capsules (500 mg total) by mouth 2 (two) times daily.  . traMADol (ULTRAM) 50 MG tablet Take 1-2 tablets (50-100 mg total) by mouth every 6 (six) hours as needed for moderate pain or severe pain.  Marland Kitchen triamcinolone (NASACORT) 55 MCG/ACT AERO nasal inhaler Place 2 sprays into the nose daily.   No facility-administered encounter medications on file as of 11/27/2018.    Physical Exam: Blood pressure 130/78, pulse 85, height 4' 11.75" (1.518 m), weight 142 lb (64.4 kg), last menstrual period 12/25/1986, SpO2 98 %. Gen:      No acute distress HEENT:  EOMI, sclera anicteric Neck:     No masses; no thyromegaly Lungs:    Clear to auscultation bilaterally; normal respiratory effort CV:         Regular rate and rhythm; no murmurs Abd:      +  bowel sounds; soft, non-tender; no palpable masses, no distension Ext:    No edema; adequate peripheral perfusion Skin:      Warm and dry; no rash Neuro: alert and oriented x 3 Psych: normal mood and affect  Data Reviewed: Imaging: CT abdomen pelvis 02/02/2010-4 mm right subpleural lung nodule.  No acute pulmonary findings CT abdomen pelvis 10/21/2015-stable right pleural lung nodule, no acute lung findings Chest x-ray 08/22/2018- no acute cardiopulmonary abnormality CT abdomen pelvis  09/13/2018- 4-5 mm right lung nodule.  No acute findings. I have reviewed the images personally.  PFTs: 11/27/2018-FVC 3.02 (128%), FEV1 2.56 [145%), F/F 85, TLC 121%, DLCO 112% Normal test  FENO 10/01/2018-13  Labs: CBC 09/04/2018-WBC 4.7, eos 4.2%, absolute eosinophil count 197 CBC 10/01/2018-WBC 5.6, eos 4.6%, absolute eosinophil count 58 IgE 10/01/2018-53  Assessment:  Chronic cough Likely has upper airway cough from postnasal drip with contribution from ACE inhibitor Symptoms have improved since last visit but still has occasional residual cough  Continue with stepwise treatment for cough Chlorpheniramine, Nasacort.  ENT eval is pending Cough suppression with Hycodan.  Continue Tessalon and Delsym as needed.  PFTs reviewed with no significant obstruction.  There is no clear evidence of asthma.  However there is improvement in mid flow rates suggestive of small airway disease.  I will give her a sample of Symbicort inhaler for 2 weeks to see if this makes a difference. Suspicion for intrinsic lung problems such as COPD is low. No evidence of ILD  Suspected sleep apnea Reports snoring and witnessed apneas.  Untreated OSA could contribute to ongoing cough Schedule for sleep study.  Plan/Recommendations: - Chlorpheniramine, Astelin, steroid nasal spray - Sample of Symbicort.  Call in prescription if this helps with symptoms. - Agree with ENT evaluation - Sleep study  Follow-up in 1 to 2 months.  Marshell Garfinkel MD [facility] 11/27/2018, 11:49 AM  CC: Lucille Passy, MD

## 2018-11-27 NOTE — Progress Notes (Signed)
PFT done today. 

## 2018-12-26 ENCOUNTER — Other Ambulatory Visit: Payer: Self-pay | Admitting: Obstetrics & Gynecology

## 2018-12-26 ENCOUNTER — Encounter: Payer: Self-pay | Admitting: Obstetrics & Gynecology

## 2018-12-26 MED ORDER — ESTROGENS, CONJUGATED 0.625 MG/GM VA CREA: TOPICAL_CREAM | VAGINAL | 0 refills | Status: DC

## 2018-12-26 NOTE — Telephone Encounter (Signed)
Patient sent the following correspondence through [place]. Routing to triage to assist patient with request.  Stanton Kidney  Could you authorize a refill for my Premarin vaginal cream 0.625 to [facility].   thanks   Karen Kays

## 2018-12-26 NOTE — Telephone Encounter (Signed)
Medication refill request: Premarin Last AEX:  02/13/18 JJ Next AEX: 03/03/19 Last MMG (if hormonal medication request): 04/15/18 BIRADS 1 negative/density b Refill authorized: 02/27/18 #30g w/1 refills; today please advise

## 2019-01-06 DIAGNOSIS — M9901 Segmental and somatic dysfunction of cervical region: Secondary | ICD-10-CM | POA: Diagnosis not present

## 2019-01-06 DIAGNOSIS — M545 Low back pain: Secondary | ICD-10-CM | POA: Diagnosis not present

## 2019-01-06 DIAGNOSIS — M9903 Segmental and somatic dysfunction of lumbar region: Secondary | ICD-10-CM | POA: Diagnosis not present

## 2019-01-06 DIAGNOSIS — M542 Cervicalgia: Secondary | ICD-10-CM | POA: Diagnosis not present

## 2019-01-11 ENCOUNTER — Ambulatory Visit (HOSPITAL_BASED_OUTPATIENT_CLINIC_OR_DEPARTMENT_OTHER): Payer: Medicare Other | Attending: Pulmonary Disease | Admitting: Pulmonary Disease

## 2019-01-11 VITALS — Ht 60.0 in | Wt 145.0 lb

## 2019-01-11 DIAGNOSIS — G4733 Obstructive sleep apnea (adult) (pediatric): Secondary | ICD-10-CM | POA: Insufficient documentation

## 2019-01-11 DIAGNOSIS — R0683 Snoring: Secondary | ICD-10-CM

## 2019-01-13 ENCOUNTER — Ambulatory Visit: Payer: Self-pay

## 2019-01-13 NOTE — Telephone Encounter (Signed)
Dr. Deborra Medina please advise, Pt states that she is still having coughing with the amlodipine 5 mg tablet. She is wanting to be changed to a brand name instead for a month to see if that helps.

## 2019-01-14 DIAGNOSIS — G4737 Central sleep apnea in conditions classified elsewhere: Secondary | ICD-10-CM | POA: Diagnosis not present

## 2019-01-14 MED ORDER — NORVASC 5 MG PO TABS: 5.0000 mg | ORAL_TABLET | Freq: Every day | ORAL | 0 refills | Status: DC

## 2019-01-14 NOTE — Procedures (Signed)
Patient Name: Gloria Werner, Agar Date: 01/11/2019 Gender: Female D.O.B: [birth year] Age (years): 65 Referring Provider: Marshell Garfinkel Height (inches): 60 Interpreting Physician: Kara Mead MD, ABSM Weight (lbs): 145 RPSGT: Heugly, [place] BMI: 28 MRN: [id number] Neck Size: 13.50 <br> <br> CLINICAL INFORMATION Sleep Study Type: NPSG    Indication for sleep study: Hypertension, Snoring, Witnessed Apneas    Epworth Sleepiness Score: 7    SLEEP STUDY TECHNIQUE As per the AASM Manual for the Scoring of Sleep and Associated Events v2.3 (April 2016) with a hypopnea requiring 4% desaturations.  The channels recorded and monitored were frontal, central and occipital EEG, electrooculogram (EOG), submentalis EMG (chin), nasal and oral airflow, thoracic and abdominal wall motion, anterior tibialis EMG, snore microphone, electrocardiogram, and pulse oximetry.  MEDICATIONS Medications self-administered by patient taken the night of the study : N/A  SLEEP ARCHITECTURE The study was initiated at 10:16:01 PM and ended at 5:01:53 AM.  Sleep onset time was 14.1 minutes and the sleep efficiency was 72.9%%. The total sleep time was 295.7 minutes.  Stage REM latency was 61.5 minutes.  The patient spent 25.0%% of the night in stage N1 sleep, 54.7%% in stage N2 sleep, 9.8%% in stage N3 and 10.5% in REM.  Alpha intrusion was absent.  Supine sleep was 19.68%.  RESPIRATORY PARAMETERS The overall apnea/hypopnea index (AHI) was 8.3 per hour. There were 18 total apneas, including 8 obstructive, 9 central and 1 mixed apneas. There were 23 hypopneas and 23 RERAs.  The AHI during Stage REM sleep was 11.6 per hour.  AHI while supine was 26.8 per hour.  The mean oxygen saturation was 93.8%. The minimum SpO2 during sleep was 84.0%.  moderate snoring was noted during this study.  CARDIAC DATA The 2 lead EKG demonstrated sinus rhythm. The mean heart rate was 72.9 beats per minute. Other  EKG findings include: None. LEG MOVEMENT DATA The total PLMS were 0 with a resulting PLMS index of 0.0. Associated arousal with leg movement index was 20.3 .  IMPRESSIONS - Mild obstructive sleep apnea occurred during this study (AHI = 8.3/h). - No significant central sleep apnea occurred during this study (CAI = 1.8/h). - Mild oxygen desaturation was noted during this study (Min O2 = 84.0%). - The patient snored with moderate snoring volume. - No cardiac abnormalities were noted during this study. - Clinically significant periodic limb movements did not occur during sleep. Associated arousals were significant.  DIAGNOSIS - Obstructive Sleep Apnea (327.23 [G47.33 ICD-10])   RECOMMENDATIONS - Positional therapy avoiding supine position during sleep. Cardiovascular effects of this degree of OSA are minimal. Alternatively, treatment with CPAP trial or dental appliance can alos be considered if very symptomatic - Avoid alcohol, sedatives and other CNS depressants that may worsen sleep apnea and disrupt normal sleep architecture. - Sleep hygiene should be reviewed to assess factors that may improve sleep quality. - Weight management and regular exercise should be initiated or continued if appropriate.

## 2019-01-14 NOTE — Telephone Encounter (Signed)
Left Pt. VM to call back. I need to inform her that the brand name for amlodipine was sent to her pharmacy for 30 days.

## 2019-01-14 NOTE — Addendum Note (Signed)
Addended by: Janan Ridge on: 01/14/2019 01:43 PM   Modules accepted: Orders

## 2019-01-14 NOTE — Telephone Encounter (Signed)
Okay to change to dispense as written.

## 2019-01-17 ENCOUNTER — Ambulatory Visit: Payer: Self-pay | Admitting: *Deleted

## 2019-01-17 NOTE — Telephone Encounter (Signed)
Pt called with complaints of her BP beinb elevated; all readings were taken on left upper arm on 01/17/2019 #1)  @ 0645 150s/80s; #2) @1100  180s/80s; and #3) @ 1300 175/99; she states that she has other readings but have not written them down; she says that her eyes feel "foggy" like there is a film over her eyes; the pt says that the eye issue has been going on for a day; recommendations made per nurse triage protocol; she verbalizes understanding and says that she will go to the ED; the pt normally sees Dr Deborra Medina, Audrie Lia; will route to office for notification. Reason for Disposition . [2] Systolic BP  >= 202 OR Diastolic >= 542 AND [7] cardiac or neurologic symptoms (e.g., chest pain, difficulty breathing, unsteady gait, blurred vision)  Answer Assessment - Initial Assessment Questions 1. BLOOD PRESSURE: "What is the blood pressure?" "Did you take at least two measurements 5 minutes apart?"    Yes; all readings taken on left upper arm on 01/17/2019: #1) 150s/80s at 0645; #2) 180s/80s at 1100, and #3) 175/99 at 1300 2. ONSET: "When did you take your blood pressure?"     01/17/2019 at 0645, 1100, and 1300 3. HOW: "How did you obtain the blood pressure?" (e.g., visiting nurse, automatic home BP monitor)    # 1 home cuff; #2 work cuff; #3 [employer] 4. HISTORY: "Do you have a history of high blood pressure?"     Yes 5. MEDICATIONS: "Are you taking any medications for blood pressure?" "Have you missed any doses recently?"     Yes, norvasc 5 mg; no missed doses 6. OTHER SYMPTOMS: "Do you have any symptoms?" (e.g., headache, chest pain, blurred vision, difficulty breathing, weakness)     Eyes feel foggy (like film over them) for the past day 7. PREGNANCY: "Is there any chance you are pregnant?" "When was your last menstrual period?"     no  Protocols used: HIGH BLOOD PRESSURE-A-AH

## 2019-01-17 NOTE — Telephone Encounter (Signed)
TA-FYI: this pt agreed to go to ED/thx dmf

## 2019-01-21 ENCOUNTER — Ambulatory Visit: Payer: Medicare Other | Admitting: Allergy and Immunology

## 2019-01-21 ENCOUNTER — Encounter: Payer: Self-pay | Admitting: Allergy and Immunology

## 2019-01-21 ENCOUNTER — Telehealth: Payer: Self-pay

## 2019-01-21 VITALS — BP 130/74 | HR 84 | Temp 98.3°F | Resp 16 | Ht 59.5 in | Wt 147.0 lb

## 2019-01-21 DIAGNOSIS — K219 Gastro-esophageal reflux disease without esophagitis: Secondary | ICD-10-CM | POA: Diagnosis not present

## 2019-01-21 DIAGNOSIS — J3089 Other allergic rhinitis: Secondary | ICD-10-CM | POA: Diagnosis not present

## 2019-01-21 MED ORDER — OMEPRAZOLE 40 MG PO CPDR: 40.0000 mg | DELAYED_RELEASE_CAPSULE | Freq: Every day | ORAL | 5 refills | Status: DC

## 2019-01-21 MED ORDER — AZELASTINE-FLUTICASONE 137-50 MCG/ACT NA SUSP: 1.0000 | Freq: Every day | NASAL | 5 refills | Status: DC

## 2019-01-21 MED ORDER — FAMOTIDINE 40 MG PO TABS: 40.0000 mg | ORAL_TABLET | Freq: Every day | ORAL | 5 refills | Status: DC

## 2019-01-21 MED ORDER — MONTELUKAST SODIUM 10 MG PO TABS: 10.0000 mg | ORAL_TABLET | Freq: Every day | ORAL | 5 refills | Status: DC

## 2019-01-21 NOTE — Progress Notes (Signed)
Dear Dr. Deborra Medina,  Thank you for referring Gloria Werner to the [facility] on 01/21/2019.   Below is a summation of this patient's evaluation and recommendations.  Thank you for your referral. I will keep you informed about this patient's response to treatment.   If you have any questions please do not hesitate to contact me.   Sincerely,  Jiles Prows, MD Allergy / Immunology [facility]   ______________________________________________________________________    NEW PATIENT NOTE  Referring Provider: Lucille Passy, MD Primary Provider: Lucille Passy, MD Date of office visit: 01/21/2019    Subjective:   Chief Complaint:  Gloria Werner (DOB: 1941-04-23) is a 78 y.o. female who presents to the clinic on 01/21/2019 with a chief complaint of Cough .     HPI: Gloria Werner presents to this clinic in evaluation of cough.  She has a 2 to 3-year history of a morning cough with postnasal drip that would last about 15 minutes per morning usually while she was drinking her coffee.  About 8 months ago her pattern changed and she developed a continued cough soon after initiating lisinopril therapy for systemic arterial hypertension.  Lisinopril was discontinued but her cough never resolved.  She has cough associated with lots of throat clearing and feeling as though there is something stuck in her throat and a tickle stuck in her throat..  Occasionally she will sneeze with her cough.  She does not really have any shortness of breath or chest tightness or exercise intolerance or cold air induced cough.  She may have a little bit of nasal congestion but no anosmia or ugly nasal discharge or need to blow her nose.  She has undergone a significant evaluation for this cough including seeing a pulmonologist and has been treated empirically for various disease states associated with cough.  However, it  should be noted that she really has not utilized a large amount of medications directed against cough on a consistent basis.  For example, because of a fear of reacquiring C. difficile colitis she did not really treat her reflux aggressively for more than a week.  Likewise, she only used an inhaled steroid for 1 week.  There has not really been an obvious provoking factor that is responsible for this cough.  She did have a dog introduced inside the household in mid summer but otherwise her environment has been pretty much the same.  She has noticed that she will develop regurgitation if she bends over after eating which is a relatively new development.  Past Medical History:  Diagnosis Date  . Allergic rhinitis due to pollen   . Complication of anesthesia   . Irritable bowel syndrome   . 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 PONV (postoperative nausea and vomiting)     Past Surgical History:  Procedure Laterality Date  . ABDOMINAL HYSTERECTOMY  1988   secondary to prolapse  . BLADDER SUSPENSION     A-P with Hyst  . INSERTION OF MESH  10/16/2018   Procedure: INSERTION OF MESH;  Surgeon: Ardis Hughs, MD;  Location: [facility];  Service: Urology;;  . NASAL SINUS SURGERY  1970  . ROBOTIC ASSISTED LAPAROSCOPIC SACROCOLPOPEXY N/A 10/16/2018   Procedure: XI ROBOTIC ASSISTED LAPAROSCOPIC SACROCOLPOPEXY;  Surgeon: Ardis Hughs, MD;  Location: [facility];  Service: Urology;  Laterality: N/A;  . TONSILLECTOMY      Allergies as  of 01/21/2019      Reactions   Ace Inhibitors Cough   Codeine Nausea And Vomiting   Lexapro [escitalopram] Diarrhea, Other (See Comments)   headache      Medication List      amLODipine 5 MG tablet Commonly known as:  NORVASC Take 1 tablet (5 mg total) by mouth daily.   azelastine 0.1 % nasal spray Commonly known as:  ASTELIN Place 1 spray into both nostrils 2 (two) times daily as needed for rhinitis or allergies. Use in each nostril as directed   buPROPion  75 MG tablet Commonly known as:  WELLBUTRIN Take 1 tablet (75 mg total) by mouth 2 (two) times daily.   chlorpheniramine 2 MG/5ML syrup Commonly known as:  CHLOR-TRIMETON Take 2 mg by mouth every 4 (four) hours as needed for allergies.   conjugated estrogens vaginal cream Commonly known as:  PREMARIN Use 1/2 g vaginally 2 x a week at hs   LUBRICATING EYE DROPS OP Place 1 drop into both eyes daily as needed (dry eyes).   saccharomyces boulardii 250 MG capsule Commonly known as:  FLORASTOR Take 2 capsules (500 mg total) by mouth 2 (two) times daily.   traMADol 50 MG tablet Commonly known as:  ULTRAM Take 1-2 tablets (50-100 mg total) by mouth every 6 (six) hours as needed for moderate pain or severe pain.   triamcinolone 55 MCG/ACT Aero nasal inhaler Commonly known as:  NASACORT Place 2 sprays into the nose daily.       Review of systems negative except as noted in HPI / PMHx or noted below:  Review of Systems  Constitutional: Negative.   HENT: Negative.   Eyes: Negative.   Respiratory:       History of mild sleep apnea untreated presently.  Cardiovascular: Negative.   Gastrointestinal: Negative.   Genitourinary: Negative.   Musculoskeletal: Negative.   Skin: Negative.   Neurological: Negative.   Endo/Heme/Allergies: Negative.   Psychiatric/Behavioral: Negative.     Family History  Problem Relation Age of Onset  . Emphysema Mother   . Lymphoma Mother   . Asthma Mother   . Cancer Father 59       lung cancer  . Coronary artery disease Brother   . Cancer Brother        breast cancer  . Cancer Brother        lung cancer  . Diabetes Neg Hx   . Hypertension Neg Hx   . Colon cancer Neg Hx     Social History   Socioeconomic History  . Marital status: Married    Spouse name: Not on file  . Number of children: 2  . Years of education: 108  . Highest education level: Not on file  Occupational History  . Occupation: Environmental health practitioner  Social Needs  .  Financial resource strain: Not on file  . Food insecurity:    Worry: Not on file    Inability: Not on file  . Transportation needs:    Medical: Not on file    Non-medical: Not on file  Tobacco Use  . Smoking status: Never Smoker  . Smokeless tobacco: Never Used  Substance and Sexual Activity  . Alcohol use: No  . Drug use: No  . Sexual activity: Yes    Partners: Male    Birth control/protection: Surgical  Lifestyle  . Physical activity:    Days per week: Not on file    Minutes per session: Not on file  . Stress:  Not on file  Relationships  . Social connections:    Talks on phone: Not on file    Gets together: Not on file    Attends religious service: Not on file    Active member of club or organization: Not on file    Attends meetings of clubs or organizations: Not on file    Relationship status: Not on file  . Intimate partner violence:    Fear of current or ex partner: Not on file    Emotionally abused: Not on file    Physically abused: Not on file    Forced sexual activity: Not on file  Other Topics Concern  . Not on file  Social History Narrative   HSG. Married '63. 1 son- 17'; 1-daughter-'68; grandchildren 3. 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 Marriage in good health. 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 Provided lead to [email or web address].    Environmental and Social history  Lives in a house with a dry environment, a dog located inside the household, no carpet in the bedroom, no plastic on the bed, no plastic on the pillow, and no smokers located inside the household, and employment as a Hydrographic surveyor.  Objective:   Vitals:   01/21/19 0943  BP: 130/74  Pulse: 84  Resp: 16  Temp: 98.3 F (36.8 C)  SpO2: 98%   Height: 4' 11.5" (151.1 cm) Weight: 147 lb (66.7 kg)  Physical Exam Constitutional:      Appearance: She is not diaphoretic.     Comments: Constant throat  clearing  HENT:     Head: Normocephalic. No right periorbital erythema or left periorbital erythema.     Right Ear: Tympanic membrane, ear canal and external ear normal.     Left Ear: Tympanic membrane, ear canal and external ear normal.     Nose: Nose normal. No mucosal edema or rhinorrhea.     Mouth/Throat:     Pharynx: No oropharyngeal exudate.  Eyes:     General: Lids are normal.     Conjunctiva/sclera: Conjunctivae normal.     Pupils: Pupils are equal, round, and reactive to light.  Neck:     Thyroid: No thyromegaly.     Trachea: Trachea normal. No tracheal deviation.  Cardiovascular:     Rate and Rhythm: Normal rate and regular rhythm.     Heart sounds: Normal heart sounds, S1 normal and S2 normal. No murmur.  Pulmonary:     Effort: Pulmonary effort is normal. No respiratory distress.     Breath sounds: No stridor. No wheezing or rales.  Chest:     Chest wall: No tenderness.  Abdominal:     General: There is no distension.     Palpations: Abdomen is soft. There is no mass.     Tenderness: There is no abdominal tenderness. There is no guarding or rebound.  Musculoskeletal:        General: No tenderness.  Lymphadenopathy:     Head:     Right side of head: No tonsillar adenopathy.     Left side of head: No tonsillar adenopathy.     Cervical: No cervical adenopathy.  Skin:    Coloration: Skin is not pale.     Findings: No erythema or rash.     Nails: There is no clubbing.   Neurological:     Mental Status: She is alert.     Diagnostics: Allergy skin tests were performed.  She demonstrated hypersensitivity to cat, dog, and mold.  Spirometry was performed and demonstrated an FEV1 of 2.29 @ 132 % of predicted. FEV1/FVC = 0.84.  Following administration of nebulized albuterol her FEV1 rose to 2.49 which was an increase in the FEV1 of 9%.  Results of blood tests obtained 01 October 2018 identifies WBC 5.6, absolute eosinophil 300, absolute lymphocyte 1500, hemoglobin 12.8,  platelet 197, IgE 53 KU/L.  Results of FeNO obtained 01 October 2018 was 13 PPB.  Results of pulmonary function testing obtained for December 2019 identified TLC 121% of predicted, RV 117% of predicted, DL/VA 104% predicted, and postbronchodilator change in FEF 25-75 of 38%.  Results of a chest x-ray obtained 22 August 2018 identified the following:  The heart size and mediastinal contours are within normal limits. Both lungs are clear. The visualized skeletal structures are Unremarkable.  Results of a pelvic CT scan obtained 13 September 2018 identified the following:  Lower chest: 5 mm right lung nodule is identified, image 6/4, Unchanged.   Assessment and Plan:    1. LPRD (laryngopharyngeal reflux disease)   2. Other allergic rhinitis     1.  Allergen avoidance measures  2.  Treat and prevent reflux:   A.  Consolidate all forms of caffeine consumption  B.  Do not eat late meals  C.  Omeprazole 40 mg tablet in a.m.  D.  Famotidine 40 mg tablet in p.m.  3.  Replace throat clearing maneuver with swallowing maneuver  4.  Treat and prevent inflammation:   A.  Dymista 1 spray each nostril 1 time per day  B.  Montelukast 10 mg tablet 1 time per day  5.  Evaluation of throat with ENT  6.  Return to clinic in 4 weeks or earlier if problem  Samiah appears to have multiple insults to her respiratory tract including atopic disease and reflux induced respiratory disease which we will address with the therapy noted above and also have her undergo a thorough evaluation of her throat with ENT.  At this point we will hold off on any evaluation for lower airway inflammation.  I will see her back in this clinic in 4 weeks to assess her response to this approach.  Jiles Prows, MD Allergy / Immunology [facility]

## 2019-01-21 NOTE — Patient Instructions (Addendum)
  1.  Allergen avoidance measures  2.  Treat and prevent reflux:   A.  Consolidate all forms of caffeine consumption  B.  Do not eat late meals  C.  Omeprazole 40 mg tablet in a.m.  D.  Famotidine 40 mg tablet in p.m.  3.  Replace throat clearing maneuver with swallowing maneuver  4.  Treat and prevent inflammation:   A.  Dymista 1 spray each nostril 1 time per day  B.  Montelukast 10 mg tablet 1 time per day  5.  Evaluation of throat with ENT  6.  Return to clinic in 4 weeks or earlier if problem

## 2019-01-21 NOTE — Telephone Encounter (Signed)
Patient will give [facility] office a call since she was originally scheduled with them before.

## 2019-01-21 NOTE — Telephone Encounter (Signed)
Dr. Neldon Mc would like Gloria Werner to see ENT for evaluation of her throat. Cough is the reason. Please and thank you.

## 2019-01-22 ENCOUNTER — Encounter: Payer: Self-pay | Admitting: Allergy and Immunology

## 2019-01-23 ENCOUNTER — Telehealth: Payer: Self-pay | Admitting: Pulmonary Disease

## 2019-01-23 NOTE — Telephone Encounter (Signed)
Result Notes for Split night study  Notes recorded by Shon Hale, CMA on 01/23/2019 at 9:13 AM EST lmtcb x1 for pt. ------  Notes recorded by Marshell Garfinkel, MD on 01/22/2019 at 2:54 PM EST Please let patient know that patient has mild sleep apnea with mild reduction in oxygen levels during sleep I would like to try CPAP therapy.  Order AutoSet CPAP 5-15   Called spoke with patient, discussed sleep study results/recs as stated by Dr Vaughan Browner.  Patient voiced her understanding about beginning CPAP but was concerned about her cough that Dr Vaughan Browner has been treating - patient stated that the technician during her sleep study told her that she "may want to get the cough under control first."    Offered patient appt to discuss CPAP and cough >> appt scheduled with Volanda Napoleon NP for 2.3.2020 @ 0900. Nothing further needed at this time; will sign off.

## 2019-01-24 NOTE — Progress Notes (Signed)
@Patient  ID: Gloria Werner, female    DOB: January 20, 1941, 78 y.o.   MRN: [id number]  Cough/ OSA  Referring provider: Lucille Passy, MD  HPI: 79 year old female, never smoked. PMH significant for allergic rhinitis, cough, GERD, hypertension. Patient of Dr. Vaughan Browner, last seen on 11/27/18. Given sample of Symbicort for chronic cough.  Patient presents today to review sleep study which showed mild obstructive sleep  apnea with associated nocturnal hypoxia. Recommended CPAP therapy. She is concerned about starting therapy d/t current cough. She does not think she can tolerate a mask that covers her mouth . ( This is what the sleep study was done with ) She recently saw allergy, and they have started her on Singulair for post nasal gtt.  She does not wake up with a headache. She states she does very occasionally doze during the day.She denies fever, chest pain, orthopnea or hemoptysis.   Epworth Score 01/27/2019>> 4 Epworth Score 01/11/2018>> 7  Significant Tests Imaging: CT abdomen pelvis 02/02/2010-4 mm right subpleural lung nodule.  No acute pulmonary findings CT abdomen pelvis 10/21/2015-stable right pleural lung nodule, no acute lung findings Chest x-ray 08/22/2018- no acute cardiopulmonary abnormality CT abdomen pelvis 09/13/2018- 4-5 mm right lung nodule.  No acute findings.  PFTs: 11/27/2018-FVC 3.02 (128%), FEV1 2.56 [145%), F/F 85, TLC 121%, DLCO 112%  RV 117% of predicted, DL/VA 104% predicted, and postbronchodilator change in FEF 25-75 of 38%. Normal test  FENO 10/01/2018-13  Labs: CBC 09/04/2018-WBC 4.7, eos 4.2%, absolute eosinophil count 197 CBC 10/01/2018-WBC 5.6, eos 4.6%, absolute eosinophil count 58 IgE 10/01/2018-53  Allergy skin tests were performed.  She demonstrated hypersensitivity to cat, dog, and mold.  Spirometry was performed and demonstrated an FEV1 of 2.29 @ 132 % of predicted. FEV1/FVC = 0.84.  Following administration of nebulized albuterol her FEV1 rose to  2.49 which was an increase in the FEV1 of 9%.  Results of blood tests obtained 01 October 2018 identifies WBC 5.6, absolute eosinophil 300, absolute lymphocyte 1500, hemoglobin 12.8, platelet 197, IgE 53 KU/L.       Allergies  Allergen Reactions  . Ace Inhibitors Cough  . Codeine Nausea And Vomiting  . Lexapro [Escitalopram] Diarrhea and Other (See Comments)    headache    Immunization History  Administered Date(s) Administered  . H1N1 01/07/2009  . Influenza Whole 09/23/2012  . Influenza, High Dose Seasonal PF 10/11/2017  . Influenza,inj,Quad PF,6+ Mos 09/28/2015, 08/02/2016, 09/04/2018  . Influenza-Unspecified 08/25/2014  . Pneumococcal Conjugate-13 02/18/2014  . Pneumococcal Polysaccharide-23 10/22/2012  . Td 10/22/2012  . Zoster 11/28/2012    Past Medical History:  Diagnosis Date  . Allergic rhinitis due to pollen   . Complication of anesthesia   . Irritable bowel syndrome   . 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 PONV (postoperative nausea and vomiting)     Tobacco History: Social History   Tobacco Use  Smoking Status Never Smoker  Smokeless Tobacco Never Used   Never Smoker  Outpatient Medications Prior to Visit  Medication Sig Dispense Refill  . amLODipine (NORVASC) 5 MG tablet Take 1 tablet (5 mg total) by mouth daily. 90 tablet 3  . azelastine (ASTELIN) 0.1 % nasal spray Place 1 spray into both nostrils 2 (two) times daily as needed for rhinitis or allergies. Use in each nostril as directed 30 mL 1  . Azelastine-Fluticasone (DYMISTA) 137-50 MCG/ACT SUSP Place 1 spray into both nostrils daily. 23 g 5  . buPROPion (WELLBUTRIN) 75 MG tablet Take 1 tablet (  75 mg total) by mouth 2 (two) times daily. 180 tablet 3  . Carboxymethylcellul-Glycerin (LUBRICATING EYE DROPS OP) Place 1 drop into both eyes daily as needed (dry eyes).    . chlorpheniramine (CHLOR-TRIMETON) 2 MG/5ML syrup Take 2 mg by mouth every 4 (four) hours as needed for allergies.    Marland Kitchen conjugated  estrogens (PREMARIN) vaginal cream Use 1/2 g vaginally 2 x a week at hs 30 g 0  . famotidine (PEPCID) 40 MG tablet Take 1 tablet (40 mg total) by mouth at bedtime. 30 tablet 5  . montelukast (SINGULAIR) 10 MG tablet Take 1 tablet (10 mg total) by mouth at bedtime. 30 tablet 5  . omeprazole (PRILOSEC) 40 MG capsule Take 1 capsule (40 mg total) by mouth daily. 30 capsule 5  . saccharomyces boulardii (FLORASTOR) 250 MG capsule Take 2 capsules (500 mg total) by mouth 2 (two) times daily. 120 capsule 3  . triamcinolone (NASACORT) 55 MCG/ACT AERO nasal inhaler Place 2 sprays into the nose daily.    . traMADol (ULTRAM) 50 MG tablet Take 1-2 tablets (50-100 mg total) by mouth every 6 (six) hours as needed for moderate pain or severe pain. 30 tablet 0   No facility-administered medications prior to visit.       Review of Systems  Gen: Denies fever, chills, weight change,+  fatigue, night sweats HEENT: Denies blurred vision, double vision, hearing loss, tinnitus, sinus congestion, + rhinorrhea, sore throat, neck stiffness, dysphagia PULM: Denies shortness of breath, + cough, sputum production, hemoptysis, wheezing CV: Denies chest pain, edema, orthopnea, paroxysmal nocturnal dyspnea, palpitations GI: Denies abdominal pain, nausea, vomiting, diarrhea, hematochezia, melena, constipation, change in bowel habits GU: Denies dysuria, hematuria, polyuria, oliguria, urethral discharge Endocrine: Denies hot or cold intolerance, polyuria, polyphagia or appetite change Derm: Denies rash, dry skin, scaling or peeling skin change Heme: Denies easy bruising, bleeding, bleeding gums Neuro: Denies headache, numbness, weakness, slurred speech, loss of memory or consciousness  Physical Exam  BP (!) 150/90 (BP Location: Left Arm, Cuff Size: Normal)   Pulse 82   Ht 4' 11.75" (1.518 m)   Wt 149 lb (67.6 kg)   LMP 12/25/1986 (Approximate)   SpO2 99%   BMI 29.34 kg/m  Physical Exam:  General- No distress,   A&Ox3, pleasant  ENT: No sinus tenderness, TM clear, pale nasal mucosa, no oral exudate,+ post nasal drip, no LAN Cardiac: S1, S2, regular rate and rhythm, no murmur Chest: No wheeze/ rales/ dullness; no accessory muscle use, no nasal flaring, no sternal retractions Abd.: Soft Non-tender, ND, BS +, Body mass index is 29.34 kg/m. Ext: No clubbing cyanosis, edema, no obvious deformities Neuro:  normal strength, MAE x 4, A&O x 3, appropriate Skin: No rashes, or lesions, warm and dry Psych: normal mood and behavior   Lab Results:  CBC    Component Value Date/Time   WBC 5.6 10/01/2018 1130   RBC 4.30 10/01/2018 1130   HGB 10.8 (L) 10/17/2018 0514   HCT 34.7 (L) 10/17/2018 0514   PLT 197.0 10/01/2018 1130   MCV 89.5 10/01/2018 1130   MCHC 33.3 10/01/2018 1130   RDW 13.2 10/01/2018 1130   LYMPHSABS 1.5 10/01/2018 1130   MONOABS 0.4 10/01/2018 1130   EOSABS 0.3 10/01/2018 1130   BASOSABS 0.1 10/01/2018 1130    BMET    Component Value Date/Time   NA 138 10/17/2018 0514   K 3.9 10/17/2018 0514   CL 106 10/17/2018 0514   CO2 25 10/17/2018 0514   GLUCOSE  103 (H) 10/17/2018 0514   BUN 15 10/17/2018 0514   CREATININE 0.89 10/17/2018 0514   CALCIUM 8.4 (L) 10/17/2018 0514   GFRNONAA >60 10/17/2018 0514   GFRAA >60 10/17/2018 0514    BNP No results found for: BNP  ProBNP No results found for: PROBNP  Imaging: No results found.   Assessment & Plan:   ALLERGIC RHINITIS, SEASONAL Seasonal allergies contributing to cough Plan: Continue using your Singulair 10 mg once daily. This will treat the allergies and post nasal drip Continue the Prilosec 40 mg once daily  Continue the Pepcid 40 mg at bedtime. Try using the Chlor-Trimeton Syrup 2 mg / 5 mls at bedtime This may make you sleepy. Don't drive when sleepy. Follow up with Allergy as scheduled   OSA (obstructive sleep apnea) AHI of 8.3 Dr. Vaughan Browner recommends CPAP 5-15 cm H2O Pt is agreeable to try nasal pillows .  Feels like she will be able to tolerate despite cough. Plan: We will order a CPAP machine. Auto Set 5-15 cm H2O DME of your choice We will order a nasal pillow face mask, as you are worried about your cough. Continue on CPAP at bedtime.  Goal is to wear for at least 6 hours each night for maximal clinical benefit. Continue to work on weight loss, as the link between excess weight  and sleep apnea is well established.  Do not drive if sleepy. Remember to clean mask, tubing, filter, and reservoir once weekly with soapy water.  Follow up with Dr. Vaughan Browner or Judson Roch NP 30 days after starting therapy. Please contact office for sooner follow up if symptoms do not improve or worsen or seek emergency care    GERD (gastroesophageal reflux disease) Reflux contributing to cough Plan Continue the Prilosec 40 mg once daily  Continue the Pepcid 40 mg at bedtime. Follow GERD Diet We will provide you with a copy of the diet   Cough Multi-factorial ( GERD, PND 2/2 Allergic rhinitis) Treat both reflux and PND/ allergies Plan: Continue the Prilosec 40 mg once daily  Continue the Pepcid 40 mg at bedtime. Try using the Chlor-Trimeton Syrup 2 mg / 5 mls at bedtime Continue Tessalon and Delsym as needed. This may make you sleepy. Don't drive when sleepy. I will give you a copy of the GERD diet. Sips of water instead of throat clearing Sugar Free [employer]'s Originals or Jolly Ranchers for throat soothing Avoid mint, menthol . Referral to ENT per Allergy ( Dr. Wilburn Cornelia)   Pulmonary nodule Will need follow up CT 08/2019.  Essential hypertension BP elevated today in clinic States she is compliant with Norvasc daily Plan: Follow up with PCP Take BP at home daily to see if this may be white coat syndrome     Magdalen Spatz, NP 01/27/2019

## 2019-01-27 ENCOUNTER — Encounter: Payer: Self-pay | Admitting: Acute Care

## 2019-01-27 ENCOUNTER — Ambulatory Visit: Payer: Medicare Other | Admitting: Acute Care

## 2019-01-27 DIAGNOSIS — I10 Essential (primary) hypertension: Secondary | ICD-10-CM

## 2019-01-27 DIAGNOSIS — R911 Solitary pulmonary nodule: Secondary | ICD-10-CM | POA: Insufficient documentation

## 2019-01-27 DIAGNOSIS — K219 Gastro-esophageal reflux disease without esophagitis: Secondary | ICD-10-CM

## 2019-01-27 DIAGNOSIS — G4733 Obstructive sleep apnea (adult) (pediatric): Secondary | ICD-10-CM

## 2019-01-27 DIAGNOSIS — J301 Allergic rhinitis due to pollen: Secondary | ICD-10-CM

## 2019-01-27 DIAGNOSIS — R059 Cough, unspecified: Secondary | ICD-10-CM

## 2019-01-27 DIAGNOSIS — R05 Cough: Secondary | ICD-10-CM

## 2019-01-27 NOTE — Assessment & Plan Note (Addendum)
AHI of 8.3 Dr. Vaughan Browner recommends CPAP 5-15 cm H2O Pt is agreeable to try nasal pillows . Feels like she will be able to tolerate despite cough. Plan: We will order a CPAP machine. Auto Set 5-15 cm H2O DME of your choice We will order a nasal pillow face mask, as you are worried about your cough. Continue on CPAP at bedtime.  Goal is to wear for at least 6 hours each night for maximal clinical benefit. Continue to work on weight loss, as the link between excess weight  and sleep apnea is well established.  Do not drive if sleepy. Remember to clean mask, tubing, filter, and reservoir once weekly with soapy water.  Follow up with Dr. Vaughan Browner or Judson Roch NP 30 days after starting therapy. Please contact office for sooner follow up if symptoms do not improve or worsen or seek emergency care

## 2019-01-27 NOTE — Assessment & Plan Note (Addendum)
Seasonal allergies contributing to cough Plan: Continue using your Singulair 10 mg once daily. This will treat the allergies and post nasal drip Continue the Prilosec 40 mg once daily  Continue the Pepcid 40 mg at bedtime. Try using the Chlor-Trimeton Syrup 2 mg / 5 mls at bedtime This may make you sleepy. Don't drive when sleepy. Follow up with Allergy as scheduled

## 2019-01-27 NOTE — Assessment & Plan Note (Signed)
Reflux contributing to cough Plan Continue the Prilosec 40 mg once daily  Continue the Pepcid 40 mg at bedtime. Follow GERD Diet We will provide you with a copy of the diet

## 2019-01-27 NOTE — Assessment & Plan Note (Signed)
Will need follow up CT 08/2019.

## 2019-01-27 NOTE — Assessment & Plan Note (Addendum)
Multi-factorial ( GERD, PND 2/2 Allergic rhinitis) Treat both reflux and PND/ allergies Plan: Continue the Prilosec 40 mg once daily  Continue the Pepcid 40 mg at bedtime. Try using the Chlor-Trimeton Syrup 2 mg / 5 mls at bedtime Continue Tessalon and Delsym as needed. This may make you sleepy. Don't drive when sleepy. I will give you a copy of the GERD diet. Sips of water instead of throat clearing Sugar Free Wer[employer] Orig[employer] or Jolly Ranchers for throat soothing Avoid mint, menthol . Referral to ENT per Allergy ( Dr. Wilburn Cornelia)

## 2019-01-27 NOTE — Patient Instructions (Addendum)
It is nice to meet you today. We will order a CPAP machine. We will order a nasal pillow face mask, as you are worried about your cough. Continue using your Singulair 10 mg once daily. This will treat the allergies and post nasal drip Continue the Prilosec 40 mg once daily  Continue the Pepcid 40 mg at bedtime. Try using the Chlor-Trimeton Syrup 2 mg / 5 mls at bedtime This may make you sleepy. Don't drive when sleepy. I will give you a copy of the GERD diet. Sips of water instead of throat clearing Sugar Free Wer[employer] Originals or Jolly Ranchers for throat soothing Avoid mint, menthol .    Wear your  CPAP at bedtime.  Goal is to wear for at least 4-6 hours each night for maximal clinical benefit. Continue to work on weight loss, as the link between excess weight  and sleep apnea is well established.  Do not drive if sleepy. Remember to clean mask, tubing, filter, and reservoir once weekly with soapy water.  Follow up with Dr. Vaughan Browner or Judson Roch NP 30 days after starting on your machine. Please cancel the appointment for 02/03/2019.   Please contact office for sooner follow up if symptoms do not improve or worsen or seek emergency care     Food Choices for Gastroesophageal Reflux Disease, Adult When you have gastroesophageal reflux disease (GERD), the foods you eat and your eating habits are very important. Choosing the right foods can help ease your discomfort. Think about working with a nutrition specialist (dietitian) to help you make good choices. What are tips for following this plan?  Meals  Choose healthy foods that are low in fat, such as fruits, vegetables, whole grains, low-fat dairy products, and lean meat, fish, and poultry.  Eat small meals often instead of 3 large meals a day. Eat your meals slowly, and in a place where you are relaxed. Avoid bending over or lying down until 2-3 hours after eating.  Avoid eating meals 2-3 hours before bed.  Avoid drinking a lot  of liquid with meals.  Cook foods using methods other than frying. Bake, grill, or broil food instead.  Avoid or limit: ? Chocolate. ? Peppermint or spearmint. ? Alcohol. ? Pepper. ? Black and decaffeinated coffee. ? Black and decaffeinated tea. ? Bubbly (carbonated) soft drinks. ? Caffeinated energy drinks and soft drinks.  Limit high-fat foods such as: ? Fatty meat or fried foods. ? Whole milk, cream, butter, or ice cream. ? Nuts and nut butters. ? Pastries, donuts, and sweets made with butter or shortening.  Avoid foods that cause symptoms. These foods may be different for everyone. Common foods that cause symptoms include: ? Tomatoes. ? Oranges, lemons, and limes. ? Peppers. ? Spicy food. ? Onions and garlic. ? Vinegar. Lifestyle  Maintain a healthy weight. Ask your doctor what weight is healthy for you. If you need to lose weight, work with your doctor to do so safely.  Exercise for at least 30 minutes for 5 or more days each week, or as told by your doctor.  Wear loose-fitting clothes.  Do not smoke. If you need help quitting, ask your doctor.  Sleep with the head of your bed higher than your feet. Use a wedge under the mattress or blocks under the bed frame to raise the head of the bed. Summary  When you have gastroesophageal reflux disease (GERD), food and lifestyle choices are very important in easing your symptoms.  Eat small meals often instead of  3 large meals a day. Eat your meals slowly, and in a place where you are relaxed.  Limit high-fat foods such as fatty meat or fried foods.  Avoid bending over or lying down until 2-3 hours after eating.  Avoid peppermint and spearmint, caffeine, alcohol, and chocolate. This information is not intended to replace advice given to you by your health care provider. Make sure you discuss any questions you have with your health care provider. Document Released: 06/11/2012 Document Revised: 01/16/2017 Document Reviewed:  01/16/2017 Elsevier Interactive Patient Education  2019 [employer].

## 2019-01-27 NOTE — Assessment & Plan Note (Signed)
BP elevated today in clinic States she is compliant with Norvasc daily Plan: Follow up with PCP Take BP at home daily to see if this may be white coat syndrome

## 2019-02-03 ENCOUNTER — Ambulatory Visit: Payer: Self-pay | Admitting: Pulmonary Disease

## 2019-02-04 DIAGNOSIS — J309 Allergic rhinitis, unspecified: Secondary | ICD-10-CM | POA: Diagnosis not present

## 2019-02-04 DIAGNOSIS — K219 Gastro-esophageal reflux disease without esophagitis: Secondary | ICD-10-CM | POA: Diagnosis not present

## 2019-02-04 DIAGNOSIS — R053 Chronic cough: Secondary | ICD-10-CM | POA: Insufficient documentation

## 2019-02-17 DIAGNOSIS — M542 Cervicalgia: Secondary | ICD-10-CM | POA: Diagnosis not present

## 2019-02-17 DIAGNOSIS — M9901 Segmental and somatic dysfunction of cervical region: Secondary | ICD-10-CM | POA: Diagnosis not present

## 2019-02-20 DIAGNOSIS — G4733 Obstructive sleep apnea (adult) (pediatric): Secondary | ICD-10-CM | POA: Diagnosis not present

## 2019-03-03 ENCOUNTER — Ambulatory Visit: Payer: Medicare Other | Admitting: Obstetrics and Gynecology

## 2019-03-21 DIAGNOSIS — G4733 Obstructive sleep apnea (adult) (pediatric): Secondary | ICD-10-CM | POA: Diagnosis not present

## 2019-04-07 ENCOUNTER — Ambulatory Visit: Payer: Self-pay | Admitting: *Deleted

## 2019-04-07 NOTE — Telephone Encounter (Signed)
Closing this encounter  This encounter was created in error - please disregard.

## 2019-04-07 NOTE — Telephone Encounter (Signed)
Summary: abdominal pain   Pt called and left message in [place]. States she is having severe abdominal pain and needs to speak with someone.      Returned call to patient regarding abd pain. She has a hx of diverticulosis. And not quite sure if this is what is going on. She had some enemas and had some loose stool and the last one was a formed stool. She also has vomited bile. Stated her temp got down to to 95 when she was in pain and had chills. She stated her temp is now 96.8. she normally has 97.6 as her normal temperature.  Notified flow at [facility] at [facility] for review and recommendation. Call transferred to the provider's office. Routing to [facility] at [facility].  Reason for Disposition . [1] MODERATE pain (e.g., interferes with normal activities) AND [2] pain comes and goes (cramps) AND [3] present > 24 hours  (Exception: pain with Vomiting or Diarrhea - see that Guideline)  Answer Assessment - Initial Assessment Questions 1. LOCATION: "Where does it hurt?"      Lower abd 2. RADIATION: "Does the pain shoot anywhere else?" (e.g., chest, back)     Does not 3. ONSET: "When did the pain begin?" (e.g., minutes, hours or days ago)      today 4. SUDDEN: "Gradual or sudden onset?"     sudden 5. PATTERN "Does the pain come and go, or is it constant?"    - If constant: "Is it getting better, staying the same, or worsening?"      (Note: Constant means the pain never goes away completely; most serious pain is constant and it progresses)     - If intermittent: "How long does it last?" "Do you have pain now?"     (Note: Intermittent means the pain goes away completely between bouts)     Had bm and pain has subsided some 6. SEVERITY: "How bad is the pain?"  (e.g., Scale 1-10; mild, moderate, or severe)   - MILD (1-3): doesn't interfere with normal activities, abdomen soft and not tender to touch    - MODERATE (4-7): interferes with normal activities or awakens from sleep, tender to  touch    - SEVERE (8-10): excruciating pain, doubled over, unable to do any normal activities      Pain # 2 right now 7. RECURRENT SYMPTOM: "Have you ever had this type of abdominal pain before?" If so, ask: "When was the last time?" and "What happened that time?"      no 8. CAUSE: "What do you think is causing the abdominal pain?"     diverticulitis 9. RELIEVING/AGGRAVATING FACTORS: "What makes it better or worse?" (e.g., movement, antacids, bowel movement)     Having a bm made it a little bit better 10. OTHER SYMPTOMS: "Has there been any vomiting, diarrhea, constipation, or urine problems?"       Nausea and vomiting, clear bile, last bm was more formed this time 11. PREGNANCY: "Is there any chance you are pregnant?" "When was your last menstrual period?"       n/a  Protocols used: ABDOMINAL PAIN - [facility]

## 2019-04-07 NOTE — Telephone Encounter (Signed)
Pt wanted to let her know that the stomach pain is gone. Stated her temperature is 99.2. No other symptoms. Had an episode of diarrhea. Took 1 Tylenol 325 mg.-Advised pt to call if worsens or go to the ED.

## 2019-04-07 NOTE — Telephone Encounter (Signed)
Spoke with patient.  She is now having bowel movements.  No longer having abdominal pain but this morning her entire abdomen was hurting.  No pain when pushing on stomach.  She feels much better.  Her temperature in now normal.  No longer vomiting.  She is currently declining a visit as she is feeling much better.  I told her we would check on her again before the end of the day.  She can also call us back if her symptoms worsen again.  She thinks she was just constipated and this does not feel like her usual diverticulitis flare.

## 2019-04-07 NOTE — Telephone Encounter (Signed)
TA-Pt has a H/O diverticulosis/she has been constipated/she vomited bile/used enemas which caused some loose stools but last one was formed/plz advise/thx dmf

## 2019-04-08 NOTE — Telephone Encounter (Signed)
TA-Pt states that she feels a bit better/afebrile/stools are still loose but to be expected as she was backed up/she will call if she needs anything or a virtual visit/thx dmf

## 2019-04-08 NOTE — Telephone Encounter (Signed)
Noted! Thank you

## 2019-04-14 DIAGNOSIS — R1033 Periumbilical pain: Secondary | ICD-10-CM | POA: Diagnosis not present

## 2019-04-21 DIAGNOSIS — G4733 Obstructive sleep apnea (adult) (pediatric): Secondary | ICD-10-CM | POA: Diagnosis not present

## 2019-05-05 DIAGNOSIS — M546 Pain in thoracic spine: Secondary | ICD-10-CM | POA: Diagnosis not present

## 2019-05-05 DIAGNOSIS — M9902 Segmental and somatic dysfunction of thoracic region: Secondary | ICD-10-CM | POA: Diagnosis not present

## 2019-05-05 DIAGNOSIS — M542 Cervicalgia: Secondary | ICD-10-CM | POA: Diagnosis not present

## 2019-05-05 DIAGNOSIS — M9901 Segmental and somatic dysfunction of cervical region: Secondary | ICD-10-CM | POA: Diagnosis not present

## 2019-05-21 DIAGNOSIS — G4733 Obstructive sleep apnea (adult) (pediatric): Secondary | ICD-10-CM | POA: Diagnosis not present

## 2019-05-23 ENCOUNTER — Telehealth: Payer: Self-pay | Admitting: Acute Care

## 2019-05-23 NOTE — Telephone Encounter (Signed)
LVM for the patient to disregard the message that was left previously as the request received was based on the CPAP ordered.

## 2019-05-23 NOTE — Telephone Encounter (Signed)
LVM for the patient to call and advise if she has given permission for the notes to be sent to [facility].

## 2019-05-23 NOTE — Telephone Encounter (Signed)
Checked referral tab and DME order was placed 01/27/19 requesting CPAP. Note from that [place] faxed to the attention of Lovey Newcomer at [facility], fax # [phone]. Nothing further needed at this time.

## 2019-05-26 DIAGNOSIS — K5900 Constipation, unspecified: Secondary | ICD-10-CM | POA: Diagnosis not present

## 2019-05-26 DIAGNOSIS — M6281 Muscle weakness (generalized): Secondary | ICD-10-CM | POA: Diagnosis not present

## 2019-05-26 DIAGNOSIS — M62838 Other muscle spasm: Secondary | ICD-10-CM | POA: Diagnosis not present

## 2019-05-27 ENCOUNTER — Ambulatory Visit (INDEPENDENT_AMBULATORY_CARE_PROVIDER_SITE_OTHER): Payer: Medicare Other | Admitting: Acute Care

## 2019-05-27 ENCOUNTER — Encounter: Payer: Self-pay | Admitting: Acute Care

## 2019-05-27 ENCOUNTER — Other Ambulatory Visit: Payer: Self-pay

## 2019-05-27 DIAGNOSIS — R05 Cough: Secondary | ICD-10-CM

## 2019-05-27 DIAGNOSIS — J3081 Allergic rhinitis due to animal (cat) (dog) hair and dander: Secondary | ICD-10-CM | POA: Diagnosis not present

## 2019-05-27 DIAGNOSIS — G4733 Obstructive sleep apnea (adult) (pediatric): Secondary | ICD-10-CM

## 2019-05-27 DIAGNOSIS — I10 Essential (primary) hypertension: Secondary | ICD-10-CM | POA: Diagnosis not present

## 2019-05-27 DIAGNOSIS — J3089 Other allergic rhinitis: Secondary | ICD-10-CM | POA: Diagnosis not present

## 2019-05-27 DIAGNOSIS — R059 Cough, unspecified: Secondary | ICD-10-CM

## 2019-05-27 DIAGNOSIS — R011 Cardiac murmur, unspecified: Secondary | ICD-10-CM | POA: Diagnosis not present

## 2019-05-27 NOTE — Assessment & Plan Note (Signed)
Continued Cough Non-compliant with GERD therapy or Singulair Flexible laryngoscopy favors reflux Plan: We recommend that you re start your pepcid and omeprazole We recommend you resume your Singulair continue chlortrimeton  Syrup for cough as needed We will give you a copy of the GERD diet today Sips of water instead of throat clearing Sugar Free [employer] or Werther's originals for throat soothing. Delsym Cough syrup 5 cc's every 12 hours Non-sedating antihistamine of your choice daily ( Zyrtec, Allegra, Xyzol, Claritin ( Generic ok) Try Mucinex 1200 mg once daily with full glass of water to help thin secretions. We can consider CT chest at follow up.   Follow up with [place] allergy today as you have scheduled.

## 2019-05-27 NOTE — Assessment & Plan Note (Addendum)
Pt. Is compliant with CPAP therapy AHI is well controlled Down Load 04/27/2019-05/26/2019 Set pressure of 15 Usage 30/30 days or 100% > 4 hours 23 days of 77% < 4 hours 7 days or 23% Average usage 5 hours 13 minutes AHI is 2.4 Plan: Continue on CPAP at bedtime. You appear to be benefiting from the treatment  Goal is to wear for at least 6 hours each night for maximal clinical benefit. Continue to work on weight loss, as the link between excess weight  and sleep apnea is well established.   Remember to establish a good bedtime routine, and work on sleep hygiene.  Limit daytime naps , avoid stimulants such as caffeine and nicotine close to bedtime, exercise daily to promote sleep quality, avoid heavy , spicy, fried , or rich foods before bed. Ensure adequate exposure to natural light during the day,establish a relaxing bedtime routine with a pleasant sleep environment ( Bedroom between 60 and 67 degrees, turn off bright lights , TV or device screens screens , consider black out curtains or white noise machines) Do not drive if sleepy. Remember to clean mask, tubing, filter, and reservoir once weekly with soapy water.  Follow up with Dr. Vaughan Browner or Judson Roch NP   In 3 months  or before as needed.

## 2019-05-27 NOTE — Patient Instructions (Addendum)
It is good to see you today Call DME for any needed supplies for your CPAP machine Continue on CPAP at bedtime. You appear to be benefiting from the treatment  Goal is to wear for at least 6 hours each night for maximal clinical benefit. Continue to work on weight loss, as the link between excess weight  and sleep apnea is well established.   Remember to establish a good bedtime routine, and work on sleep hygiene.  Limit daytime naps , avoid stimulants such as caffeine and nicotine close to bedtime, exercise daily to promote sleep quality, avoid heavy , spicy, fried , or rich foods before bed. Ensure adequate exposure to natural light during the day,establish a relaxing bedtime routine with a pleasant sleep environment ( Bedroom between 60 and 67 degrees, turn off bright lights , TV or device screens screens , consider black out curtains or white noise machines) Do not drive if sleepy. Remember to clean mask, tubing, filter, and reservoir once weekly with soapy water.  Follow up with Dr. Vaughan Browner  or Judson Roch NP  In 3 months  or before as needed.    For your cough continue chlortrimeton  syrup We will give you a copy of the GERD diet today Sips of water instead of throat clearing Sugar Free [employer] or [employer] originals for throat soothing. Delsym Cough syrup 5 cc's every 12 hours Non-sedating antihistamine of your choice daily ( Zyrtec, Allegra, Xyzol, Claritin ( Generic ok) Try Mucinex 1200 mg once daily with full glass of water to help thin secretions. We can consider CT chest at follow up.   Follow up with [facility] today as you have scheduled.

## 2019-05-27 NOTE — Progress Notes (Signed)
History of Present Illness Gloria Werner is a 78 y.o. female never smoker with PMH significant for allergic rhinitis, cough,( pt was originally on ACE inhibitor, discontinued in 10/2018) GERD, hypertension and mild OSA. She is a Patient of Dr. Vaughan Browner   05/27/2019  Follow up CPAP visit 78 year old female, never smoked. PMH significant for allergic rhinitis, cough, GERD, hypertension. She is followed by Dr. Vaughan Browner.  Recent sleep study ( she had daytime sleepiness)  showed mild obstructive sleep  apnea with associated nocturnal hypoxia. We recommended CPAP therapy. She was initially  concerned about starting therapy d/t current cough. She did  not think she could  tolerate a mask that covers her mouth . ( This type of mask was used in the sleep study) ) She recently saw allergy, and they have started her on Singulair for post nasal gtt. She was agreeable after our last visit on 01/28/2019 to try a nasal pillows.She presents today with down load and for follow up after initiation of CPAP therapy. The original order was for 5-15 cm pressure, however the machine is set at 15 cm set pressure. Pt states he has been doing well. She finds she has less daytime sleepiness. She feels she is sleeping better. She did not like the nasal pillows, so she has been back to the sleep center for a few mask adjustments. She has gone back to using the mask she used during  the sleep study.  She states he continues to have a cough. She was seen by Dr. Neldon Mc who prescribed Singulair.She stopped taking her Singulair as she did not feel it helped her. She has an appointment with [facility] today. She made this appointment by herself.I explained that this is the same type physician as Dr. Neldon Mc.  She stopped her prilosec and pepcid. She does not feel she has GERD.She states she has tried the chlortrimeton syrup " on occasion" but states it did not help. She states she has been using the Tessalon and Delsym for cough.She  was referred to Dr. Wilburn Cornelia, who did a flexible laryngoscopy and felt her symptoms were secondary to gastroesophageal reflux. Based on examination and the patient's history, he had recommended aggressive treatment for reflux. This included avoiding spicy and acidic foods which exacerbate reflux, avoiding late meals and snacking, avoiding caffeine and stimulants, avoiding carbonated and alcoholic beverages and sleeping with the head of bed elevated. He  also discussed in detail OTC therapy as well as the use of proton pump inhibitor therapy. The risks and benefits of prescription medical therapy were discussed. Medications are prescribed , but the patient has not continued to take them. She does note that her cough is worse when she initially lays down. ( Which is why I felt she had a GERD  component to her cough.) She continues to have a thick clear mucus that comes up into her throat. She does not have any nasal drainage. Mucus is cleat and thickShe is convinced she does not have any GERD. She has noted that she gets out of breath more easily when she walks. She noted as she walks further her shortness of breath improves. She states it is worse when she is on an incline vs flat surface. She denies fever, chest pain, orthopnea or hemoptysis    Test Results:  Down Load 04/27/2019-05/26/2019 Set pressure of 15 Usage 30/30 days or 100% > 4 hours 23 days of 77% < 4 hours 7 days or 23% Average usage 5 hours  13 minutes AHI is 2.4  02/04/2019: Flexible Laryngoscopy Procedure: After adequate topical anesthetic was applied, 4 mm flexible laryngoscope was passed through the nasal cavity without difficulty. Flexible laryngoscopy shows patent anterior nasal cavity with minimal crusting, no discharge or infection.  Normal base of tongue and supraglottis Normal vocal cord mobility without vocal cord nodule, mass, polyp or tumor. Post glottic erythema consistent with gastroesophageal reflux. Hypopharynx normal  without mass, pooling of secretions or aspiration.  Patient tolerated procedure without complication or difficulty.     Epworth Score 05/26/2019 >> 6 Epworth Score 01/27/2019 >> 4 Epworth Score 01/11/2018 >> 7   Significant Tests Imaging: CT abdomen pelvis 02/02/2010-4 mm right subpleural lung nodule. No acute pulmonary findings CT abdomen pelvis 10/21/2015-stable right pleural lung nodule, no acute lung findings Chest x-ray 08/22/2018- no acute cardiopulmonary abnormality CT abdomen pelvis 09/13/2018- 4-5 mm right lung nodule. No acute findings.  PFTs: 11/27/2018-FVC 3.02 (128%), FEV1 2.56 [145%), F/F 85, TLC 121%, DLCO 112%  RV 117% of predicted, DL/VA 104% predicted,and postbronchodilator change in FEF 25-75 of 38%. Normal test  FENO 10/01/2018-13  Labs: CBC 09/04/2018-WBC 4.7, eos 4.2%, absolute eosinophil count 197 CBC 10/01/2018-WBC 5.6, eos 4.6%, absolute eosinophil count 58 IgE 10/01/2018-53  Allergy skin testswere performed. She demonstrated hypersensitivity to cat, dog, and mold.  Spirometrywas performed and demonstrated an FEV1 of 2.29@ 132% of predicted. FEV1/FVC = 0.84.Following administration of nebulized albuterol her FEV1rose to 2.49 which was an increase in the FEV1 of 9%.  Results of blood testsobtained 01 October 2018 identifies WBC 5.6, absolute eosinophil 300, absolute lymphocyte 1500, hemoglobin 12.8, platelet 197, IgE 53 KU/L.      CBC Latest Ref Rng & Units 10/17/2018 10/16/2018 10/01/2018  WBC 4.0 - 10.5 K/uL - - 5.6  Hemoglobin 12.0 - 15.0 g/dL 10.8(L) 12.3 12.8  Hematocrit 36.0 - 46.0 % 34.7(L) 40.1 38.5  Platelets 150.0 - 400.0 K/uL - - 197.0    BMP Latest Ref Rng & Units 10/17/2018 10/14/2018 09/04/2018  Glucose 70 - 99 mg/dL 103(H) 109(H) 88  BUN 8 - 23 mg/dL 15 17 20   Creatinine 0.44 - 1.00 mg/dL 0.89 0.74 0.79  Sodium 135 - 145 mmol/L 138 140 140  Potassium 3.5 - 5.1 mmol/L 3.9 4.3 4.1  Chloride 98 - 111 mmol/L 106 106 105  CO2 22  - 32 mmol/L 25 26 28   Calcium 8.9 - 10.3 mg/dL 8.4(L) 9.3 9.4    BNP No results found for: BNP  ProBNP No results found for: PROBNP  PFT    Component Value Date/Time   FEV1PRE 2.44 11/27/2018 1053   FEV1POST 2.56 11/27/2018 1053   FVCPRE 3.04 11/27/2018 1053   FVCPOST 3.02 11/27/2018 1053   TLC 5.36 11/27/2018 1053   DLCOUNC 20.78 11/27/2018 1053   PREFEV1FVCRT 80 11/27/2018 1053   PSTFEV1FVCRT 85 11/27/2018 1053    No results found.   Past medical hx Past Medical History:  Diagnosis Date   Allergic rhinitis due to pollen    Complication of anesthesia    Irritable bowel syndrome    Lichen simplex chronicus 10/05   PONV (postoperative nausea and vomiting)      Social History   Tobacco Use   Smoking status: Never Smoker   Smokeless tobacco: Never Used  Substance Use Topics   Alcohol use: No   Drug use: No    Ms.Hergert reports that she has never smoked. She has never used smokeless tobacco. She reports that she does not drink alcohol or use drugs.  Tobacco Cessation: Never smoker  Past surgical hx, Family hx, Social hx all reviewed.  Current Outpatient Medications on File Prior to Visit  Medication Sig   azelastine (ASTELIN) 0.1 % nasal spray Place 1 spray into both nostrils 2 (two) times daily as needed for rhinitis or allergies. Use in each nostril as directed   buPROPion (WELLBUTRIN) 75 MG tablet Take 1 tablet (75 mg total) by mouth 2 (two) times daily.   Carboxymethylcellul-Glycerin (LUBRICATING EYE DROPS OP) Place 1 drop into both eyes daily as needed (dry eyes).   saccharomyces boulardii (FLORASTOR) 250 MG capsule Take 2 capsules (500 mg total) by mouth 2 (two) times daily.   triamcinolone (NASACORT) 55 MCG/ACT AERO nasal inhaler Place 2 sprays into the nose daily.   amLODipine (NORVASC) 5 MG tablet Take 1 tablet (5 mg total) by mouth daily. (Patient not taking: Reported on 05/27/2019)   Azelastine-Fluticasone (DYMISTA) 137-50 MCG/ACT  SUSP Place 1 spray into both nostrils daily. (Patient not taking: Reported on 05/27/2019)   chlorpheniramine (CHLOR-TRIMETON) 2 MG/5ML syrup Take 2 mg by mouth every 4 (four) hours as needed for allergies.   conjugated estrogens (PREMARIN) vaginal cream Use 1/2 g vaginally 2 x a week at hs (Patient not taking: Reported on 05/27/2019)   montelukast (SINGULAIR) 10 MG tablet Take 1 tablet (10 mg total) by mouth at bedtime. (Patient not taking: Reported on 05/27/2019)   omeprazole (PRILOSEC) 40 MG capsule Take 1 capsule (40 mg total) by mouth daily. (Patient not taking: Reported on 05/27/2019)   No current facility-administered medications on file prior to visit.      Allergies  Allergen Reactions   Ace Inhibitors Cough   Codeine Nausea And Vomiting   Lexapro [Escitalopram] Diarrhea and Other (See Comments)    headache    Review Of Systems:  Constitutional:   No  weight loss, night sweats,  Fevers, chills, fatigue, or  lassitude.  HEENT:   No headaches,  Difficulty swallowing,  Tooth/dental problems, or  Sore throat,                No sneezing, itching, ear ache, nasal congestion, +post nasal drip,   CV:  No chest pain,  Orthopnea, PND, swelling in lower extremities, anasarca, dizziness, palpitations, syncope.   GI  No heartburn, indigestion, abdominal pain, nausea, vomiting, diarrhea, change in bowel habits, loss of appetite, bloody stools.   Resp: No shortness of breath with exertion or at rest.  No excess mucus, no productive cough,  + non-productive cough,  No coughing up of blood.  No change in color of mucus.  No wheezing.  No chest wall deformity  Skin: no rash or lesions.  GU: no dysuria, change in color of urine, no urgency or frequency.  No flank pain, no hematuria   MS:  No joint pain or swelling.  No decreased range of motion.  No back pain.  Psych:  No change in mood or affect. No depression or anxiety.  No memory loss.   Vital Signs BP (!) 150/88 (BP Location: Left  Arm, Cuff Size: Normal)    Pulse 87    Temp 98.6 F (37 C)    Ht 5' (1.524 m)    Wt 153 lb 3.2 oz (69.5 kg)    LMP 12/25/1986 (Approximate)    SpO2 95%    BMI 29.92 kg/m    Physical Exam:  General- No distress,  A&Ox3, pleasant ENT: No sinus tenderness, TM clear, pale nasal mucosa, no oral exudate,+ post nasal  drip, + frequent throat clearing, no LAN Cardiac: S1, S2, regular rate and rhythm, no murmur Chest: No wheeze/ rales/ dullness; no accessory muscle use, no nasal flaring, no sternal retractions Abd.: Soft Non-tender, ND, BS +, Body mass index is 29.92 kg/m. Ext: No clubbing cyanosis, edema Neuro:  normal strength Skin: No rashes, warm and dry Psych: normal mood and behavior   Assessment/Plan  OSA (obstructive sleep apnea) Pt. Is compliant with CPAP therapy AHI is well controlled Down Load 04/27/2019-05/26/2019 Set pressure of 15 Usage 30/30 days or 100% > 4 hours 23 days of 77% < 4 hours 7 days or 23% Average usage 5 hours 13 minutes AHI is 2.4 Plan: Continue on CPAP at bedtime. You appear to be benefiting from the treatment  Goal is to wear for at least 6 hours each night for maximal clinical benefit. Continue to work on weight loss, as the link between excess weight  and sleep apnea is well established.   Remember to establish a good bedtime routine, and work on sleep hygiene.  Limit daytime naps , avoid stimulants such as caffeine and nicotine close to bedtime, exercise daily to promote sleep quality, avoid heavy , spicy, fried , or rich foods before bed. Ensure adequate exposure to natural light during the day,establish a relaxing bedtime routine with a pleasant sleep environment ( Bedroom between 60 and 67 degrees, turn off bright lights , TV or device screens screens , consider black out curtains or white noise machines) Do not drive if sleepy. Remember to clean mask, tubing, filter, and reservoir once weekly with soapy water.  Follow up with Dr. Vaughan Browner or Judson Roch NP   In  3 months  or before as needed.     Cough Continued Cough Non-compliant with GERD therapy or Singulair Flexible laryngoscopy favors reflux Plan: We recommend that you re start your pepcid and omeprazole We recommend you resume your Singulair continue chlortrimeton  Syrup for cough as needed We will give you a copy of the GERD diet today Sips of water instead of throat clearing Sugar Free [employer] or [employer] originals for throat soothing. Delsym Cough syrup 5 cc's every 12 hours Non-sedating antihistamine of your choice daily ( Zyrtec, Allegra, Xyzol, Claritin ( Generic ok) Try Mucinex 1200 mg once daily with full glass of water to help thin secretions. We can consider CT chest at follow up.   Follow up with [facility] today as you have scheduled.   Essential hypertension Taken off ACE 2/2 cough No longer taking losartan Taking an herbal medication as she feels other anti hypertensives are too strong Plan Please follow up with PCP about BP management   Sinusitis Resume previously prescribed non-steroidal antihistamine Please resume Singulair previously prescribed.  Magdalen Spatz, AGACNP-BC [facility] Pulmonary/Critical Care Medicine Pager # [phone] 05/27/2019 2:31 PM

## 2019-05-27 NOTE — Assessment & Plan Note (Signed)
Taken off ACE 2/2 cough No longer taking losartan Taking an herbal medication as she feels other anti hypertensives are too strong Plan Please follow up with PCP about BP management

## 2019-06-04 DIAGNOSIS — M62838 Other muscle spasm: Secondary | ICD-10-CM | POA: Diagnosis not present

## 2019-06-04 DIAGNOSIS — M6281 Muscle weakness (generalized): Secondary | ICD-10-CM | POA: Diagnosis not present

## 2019-06-04 DIAGNOSIS — K5900 Constipation, unspecified: Secondary | ICD-10-CM | POA: Diagnosis not present

## 2019-06-09 ENCOUNTER — Other Ambulatory Visit: Payer: Self-pay

## 2019-06-09 ENCOUNTER — Encounter: Payer: Self-pay | Admitting: Obstetrics & Gynecology

## 2019-06-09 ENCOUNTER — Ambulatory Visit (INDEPENDENT_AMBULATORY_CARE_PROVIDER_SITE_OTHER): Payer: Medicare Other | Admitting: Obstetrics & Gynecology

## 2019-06-09 VITALS — BP 140/74 | HR 88 | Temp 97.6°F | Resp 14 | Ht 59.5 in | Wt 151.8 lb

## 2019-06-09 DIAGNOSIS — Z01419 Encounter for gynecological examination (general) (routine) without abnormal findings: Secondary | ICD-10-CM

## 2019-06-09 NOTE — Progress Notes (Signed)
78 y.o. G67P2002 Married White or Caucasian female here for annual exam.  Doing well.    Had laparoscopic sacrocolpopexy 10/19 with Dr. Louis Meckel. Empting bladder normally.  Has very rare urinary leakage.  Does not have any vaginal bleeding.  No longer using pessary and this makes her very happy.  Stopped using the vaginal premarin cream after her surgery.   Still has chronic cough.  Has seen ENT, GI, allergist and now has cardiology appt scheduled.    PCP:  Dr. Deborra Medina  Patient's last menstrual period was 12/25/1986 (approximate).          Sexually active: No.  The current method of family planning is post menopausal status.    Exercising: Yes.    walking  Smoker:  no  Health Maintenance: Pap:  Hysterectomy 1988 History of abnormal Pap:  no MMG:  Density Birads 1 negative 4/19 Colonoscopy:  09/01/13 polyps, adenomatous polyp.  Did cologuard last year.   BMD:  02-16-16 Osteopenia    TDaP:  10-22-12 Pneumonia vaccine(s):  Yes per patient   Shingrix:   Yes per patient  Hep C testing: no  Screening Labs: PCP   reports that she has never smoked. She has never used smokeless tobacco. She reports that she does not drink alcohol or use drugs.  Past Medical History:  Diagnosis Date  . Allergic rhinitis due to pollen   . Complication of anesthesia   . Irritable bowel syndrome   . 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 PONV (postoperative nausea and vomiting)     Past Surgical History:  Procedure Laterality Date  . ABDOMINAL HYSTERECTOMY  1988   secondary to prolapse  . BLADDER SUSPENSION     A-P with Hyst  . INSERTION OF MESH  10/16/2018   Procedure: INSERTION OF MESH;  Surgeon: Ardis Hughs, MD;  Location: [facility];  Service: Urology;;  . NASAL SINUS SURGERY  1970  . ROBOTIC ASSISTED LAPAROSCOPIC SACROCOLPOPEXY N/A 10/16/2018   Procedure: XI ROBOTIC ASSISTED LAPAROSCOPIC SACROCOLPOPEXY;  Surgeon: Ardis Hughs, MD;  Location: [facility];  Service: Urology;  Laterality: N/A;  .  TONSILLECTOMY      Current Outpatient Medications  Medication Sig Dispense Refill  . amLODipine (NORVASC) 5 MG tablet Take 1 tablet (5 mg total) by mouth daily. (Patient not taking: Reported on 05/27/2019) 90 tablet 3  . azelastine (ASTELIN) 0.1 % nasal spray Place 1 spray into both nostrils 2 (two) times daily as needed for rhinitis or allergies. Use in each nostril as directed 30 mL 1  . Azelastine-Fluticasone (DYMISTA) 137-50 MCG/ACT SUSP Place 1 spray into both nostrils daily. (Patient not taking: Reported on 05/27/2019) 23 g 5  . buPROPion (WELLBUTRIN) 75 MG tablet Take 1 tablet (75 mg total) by mouth 2 (two) times daily. 180 tablet 3  . Carboxymethylcellul-Glycerin (LUBRICATING EYE DROPS OP) Place 1 drop into both eyes daily as needed (dry eyes).    . chlorpheniramine (CHLOR-TRIMETON) 2 MG/5ML syrup Take 2 mg by mouth every 4 (four) hours as needed for allergies.    Marland [employer] conjugated estrogens (PREMARIN) vaginal cream Use 1/2 g vaginally 2 x a week at hs (Patient not taking: Reported on 05/27/2019) 30 g 0  . montelukast (SINGULAIR) 10 MG tablet Take 1 tablet (10 mg total) by mouth at bedtime. (Patient not taking: Reported on 05/27/2019) 30 tablet 5  . omeprazole (PRILOSEC) 40 MG capsule Take 1 capsule (40 mg total) by mouth daily. (Patient not taking: Reported on 05/27/2019) 30 capsule 5  .  saccharomyces boulardii (FLORASTOR) 250 MG capsule Take 2 capsules (500 mg total) by mouth 2 (two) times daily. 120 capsule 3  . triamcinolone (NASACORT) 55 MCG/ACT AERO nasal inhaler Place 2 sprays into the nose daily.     No current facility-administered medications for this visit.     Family History  Problem Relation Age of Onset  . Emphysema Mother   . Lymphoma Mother   . Asthma Mother   . Cancer Father 31       lung cancer  . Coronary artery disease Brother   . Cancer Brother        breast cancer  . Cancer Brother        lung cancer  . Diabetes Neg Hx   . Hypertension Neg Hx   . Colon cancer Neg Hx      Review of Systems  All other systems reviewed and are negative.   Exam:   LMP 12/25/1986 (Approximate)   Height:      Ht Readings from Last 3 Encounters:  05/27/19 5' (1.524 m)  01/27/19 4' 11.75" (1.518 m)  01/21/19 4' 11.5" (1.511 m)    General appearance: alert, cooperative and appears stated age Head: Normocephalic, without obvious abnormality, atraumatic Neck: no adenopathy, supple, symmetrical, trachea midline and thyroid normal to inspection and palpation Lungs: clear to auscultation bilaterally Breasts: normal appearance, no masses or tenderness Heart: regular rate and rhythm Abdomen: soft, non-tender; bowel sounds normal; no masses,  no organomegaly Extremities: extremities normal, atraumatic, no cyanosis or edema Skin: Skin color, texture, turgor normal. No rashes or lesions Lymph nodes: Cervical, supraclavicular, and axillary nodes normal. No abnormal inguinal nodes palpated Neurologic: Grossly normal   Pelvic: External genitalia:  no lesions              Urethra:  normal appearing urethra with no masses, tenderness or lesions              Bartholins and Skenes: normal                 Vagina: normal appearing vagina with normal color and discharge, no lesions              Cervix: absent              Pap taken: No. Bimanual Exam:  Uterus:  uterus absent              Adnexa: no mass, fullness, tenderness               Rectovaginal: Confirms               Anus:  normal sphincter tone, no lesions  Chaperone was present for exam.  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  Her insurance pays for this every other year pap smear not indicated Lab work done with Dr. Deborra Medina BMD due 1-2 years Colonoscopy due.  Reviewed with pt she should have colonoscopy and that it is overdue. States she will schedule. Advised can return prn or with  any new issues/concerns.

## 2019-06-17 DIAGNOSIS — N816 Rectocele: Secondary | ICD-10-CM | POA: Diagnosis not present

## 2019-06-17 DIAGNOSIS — K5900 Constipation, unspecified: Secondary | ICD-10-CM | POA: Diagnosis not present

## 2019-06-17 DIAGNOSIS — N8111 Cystocele, midline: Secondary | ICD-10-CM | POA: Diagnosis not present

## 2019-06-21 DIAGNOSIS — G4733 Obstructive sleep apnea (adult) (pediatric): Secondary | ICD-10-CM | POA: Diagnosis not present

## 2019-06-23 DIAGNOSIS — M9902 Segmental and somatic dysfunction of thoracic region: Secondary | ICD-10-CM | POA: Diagnosis not present

## 2019-06-23 DIAGNOSIS — M546 Pain in thoracic spine: Secondary | ICD-10-CM | POA: Diagnosis not present

## 2019-06-23 DIAGNOSIS — M542 Cervicalgia: Secondary | ICD-10-CM | POA: Diagnosis not present

## 2019-06-23 DIAGNOSIS — M9901 Segmental and somatic dysfunction of cervical region: Secondary | ICD-10-CM | POA: Diagnosis not present

## 2019-06-25 ENCOUNTER — Telehealth: Payer: Self-pay

## 2019-06-25 NOTE — Telephone Encounter (Signed)
Left message for patient to call back regarding covid-19 screening for appointment with Dr. Nishan on 06/25/19.  

## 2019-06-25 NOTE — Telephone Encounter (Signed)
Patient is returning your call.  

## 2019-06-25 NOTE — Telephone Encounter (Signed)
error 

## 2019-06-26 NOTE — Telephone Encounter (Signed)
Follow up   Patient is returning call for Covid 19 screening. Please call.

## 2019-06-30 NOTE — Telephone Encounter (Signed)
    COVID-19 Pre-Screening Questions:  . In the past 7 to 10 days have you had a cough,  shortness of breath, headache, congestion, fever (100 or greater) body aches, chills, sore throat, or sudden loss of taste or sense of smell? No  . Have you been around anyone with known Covid 19.No . Have you been around anyone who is awaiting Covid 19 test results in the past 7 to 10 days? NO . Have you been around anyone who has been exposed to Covid 19, or has mentioned symptoms of Covid 19 within the past 7 to 10 days?  NO  If you have any concerns/questions about symptoms patients report during screening (either on the phone or at threshold). Contact the provider seeing the patient or DOD for further guidance.  If neither are available contact a member of the leadership team.           

## 2019-07-02 DIAGNOSIS — N816 Rectocele: Secondary | ICD-10-CM | POA: Diagnosis not present

## 2019-07-02 DIAGNOSIS — K5900 Constipation, unspecified: Secondary | ICD-10-CM | POA: Diagnosis not present

## 2019-07-06 NOTE — Progress Notes (Signed)
Cardiology Consultation:   Patient ID: Gloria Werner MRN: [id number]; DOB: Nov 15, 1941  Admit date: (Not on file) Date of Consult: 07/09/2019  Primary Care Provider: Lucille Passy, MD Primary Cardiologist: New Primary Electrophysiologist:  None     Patient Profile:   Gloria Werner is a 78 y.o. female with a hx of no cardiac disease  who is being seen today for the evaluation of murmur  at the request of her allergist and Dr Deborra Medina .  History of Present Illness:   Gloria Werner 78 y.o. no cardiac history. Non smoker. Chronic cough seen by ENT/Allergist. Latter indicated hearing a murmur. No history of SBE, RF, Has had nasal sinus surgery Using prolosec for GERD. On amlodipine for HTN No ACE/ARB  She has no cardiac symptoms BP better on norvasc   I take care of her husband Gloria Werner who has afib.  Murmur today in aortic area. Discussed diagnosis of AV sclerosis vs Stenosis and need for echo   Heart Pathway Score:     Past Medical History:  Diagnosis Date  . Allergic rhinitis due to pollen   . Complication of anesthesia   . Cough   . Gastroesophageal reflux disease without esophagitis   . Irritable bowel syndrome   . 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 PONV (postoperative nausea and vomiting)     Past Surgical History:  Procedure Laterality Date  . ABDOMINAL HYSTERECTOMY  1988   secondary to prolapse  . BLADDER SUSPENSION     A-P with Hyst  . INSERTION OF MESH  10/16/2018   Procedure: INSERTION OF MESH;  Surgeon: Ardis Hughs, MD;  Location: [facility];  Service: Urology;;  . NASAL SINUS SURGERY  1970  . ROBOTIC ASSISTED LAPAROSCOPIC SACROCOLPOPEXY N/A 10/16/2018   Procedure: XI ROBOTIC ASSISTED LAPAROSCOPIC SACROCOLPOPEXY;  Surgeon: Ardis Hughs, MD;  Location: [facility];  Service: Urology;  Laterality: N/A;  . TONSILLECTOMY       Home Medications:  Prior to Admission medications   Medication Sig Start Date End Date Taking? Authorizing Provider   amLODipine (NORVASC) 5 MG tablet Take 1 tablet (5 mg total) by mouth daily. 11/06/18   Lucille Passy, MD  azelastine (ASTELIN) 0.1 % nasal spray Place 1 spray into both nostrils 2 (two) times daily as needed for rhinitis or allergies. Use in each nostril as directed 11/06/18   Lucille Passy, MD  Azelastine-Fluticasone [facility]) 137-50 MCG/ACT SUSP Place 1 spray into both nostrils daily. 01/21/19   Kozlow, Donnamarie Poag, MD  buPROPion (WELLBUTRIN) 75 MG tablet Take 1 tablet (75 mg total) by mouth 2 (two) times daily. 02/13/18   Salvadore Dom, MD  Carboxymethylcellul-Glycerin (LUBRICATING EYE DROPS OP) Place 1 drop into both eyes daily as needed (dry eyes).    [provider]  chlorpheniramine (CHLOR-TRIMETON) 2 MG/5ML syrup Take 2 mg by mouth every 4 (four) hours as needed for allergies.    [provider]  montelukast (SINGULAIR) 10 MG tablet Take 1 tablet (10 mg total) by mouth at bedtime. 01/21/19   Kozlow, Donnamarie Poag, MD  omeprazole (PRILOSEC) 40 MG capsule Take 1 capsule (40 mg total) by mouth daily. 01/21/19   Kozlow, Donnamarie Poag, MD  saccharomyces boulardii (FLORASTOR) 250 MG capsule Take 2 capsules (500 mg total) by mouth 2 (two) times daily. 11/05/15   Esterwood, Amy S, PA-C  triamcinolone (NASACORT) 55 MCG/ACT AERO nasal inhaler Place 2 sprays into the nose daily.    [provider]  Inpatient Medications: Scheduled Meds:  Continuous Infusions:  PRN Meds:   Allergies:    Allergies  Allergen Reactions  . Ace Inhibitors Cough  . Codeine Nausea And Vomiting  . Lexapro [Escitalopram] Diarrhea and Other (See Comments)    headache    Social History:   Social History   Socioeconomic History  . Marital status: Married    Spouse name: Not on file  . Number of children: 2  . Years of education: 42  . Highest education level: Not on file  Occupational History  . Occupation: Environmental health practitioner  Social Needs  . Financial resource strain: Not on file  . Food  insecurity    Worry: Not on file    Inability: Not on file  . Transportation needs    Medical: Not on file    Non-medical: Not on file  Tobacco Use  . Smoking status: Never Smoker  . Smokeless tobacco: Never Used  Substance and Sexual Activity  . Alcohol use: No  . Drug use: No  . Sexual activity: Yes    Partners: Male    Birth control/protection: Surgical  Lifestyle  . Physical activity    Days per week: Not on file    Minutes per session: Not on file  . Stress: Not on file  Relationships  . Social Herbalist on phone: Not on file    Gets together: Not on file    Attends religious service: Not on file    Active member of club or organization: Not on file    Attends meetings of clubs or organizations: Not on file    Relationship status: Not on file  . Intimate partner violence    Fear of current or ex partner: Not on file    Emotionally abused: Not on file    Physically abused: Not on file    Forced sexual activity: Not on file  Other Topics Concern  . Not on file  Social History Narrative   HSG. Married '63. 1 son- 53'; 1-daughter-'68; grandchildren 3. 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 Marriage in good health. 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 Provided lead to [email or web address].    Family History:    Family History  Problem Relation Age of Onset  . Emphysema Mother   . Lymphoma Mother   . Asthma Mother   . Cancer Father 79       lung cancer  . Coronary artery disease Brother   . Cancer Brother        breast cancer  . Cancer Brother        lung cancer  . Diabetes Neg Hx   . Hypertension Neg Hx   . Colon cancer Neg Hx      ROS:  Please see the history of present illness.   All other ROS reviewed and negative.     Physical Exam/Data:   Vitals:   07/09/19 0926  BP: 138/88  Pulse: 94  SpO2: 98%  Weight: 154 lb 9.6 oz (70.1 kg)  Height: 5' (1.524 m)    @IOBRIEF @ Last 3 Weights 07/09/2019 06/09/2019 05/27/2019  Weight (lbs) 154 lb 9.6 oz 151 lb 12.8 oz 153 lb 3.2 oz  Weight (kg) 70.126 kg 68.856 kg 69.491 kg     Body mass index is 30.19 kg/m.  General:  Well nourished, well developed, in no acute distress  HEENT: normal Lymph: no adenopathy Neck: no JVD  Endocrine:  No thryomegaly Vascular: No carotid bruits; FA pulses 2+ bilaterally without bruits  Cardiac:  normal S1, S2; RRR; AV sclerosis murmur  Lungs:  clear to auscultation bilaterally, no wheezing, rhonchi or rales  Abd: soft, nontender, no hepatomegaly  Ext: no edema Musculoskeletal:  No deformities, BUE and BLE strength normal and equal Skin: warm and dry  Neuro:  CNs 2-12 intact, no focal abnormalities noted Psych:  Normal affect   EKG:  10/14/18 SR normal ECG    Relevant CV Studies: None   Laboratory Data:  High Sensitivity Troponin:  No results for input(s): TROPONINIHS in the last 720 hours.   Cardiac EnzymesNo results for input(s): TROPONINI in the last 168 hours. No results for input(s): TROPIPOC in the last 168 hours.  ChemistryNo results for input(s): NA, K, CL, CO2, GLUCOSE, BUN, CREATININE, CALCIUM, GFRNONAA, GFRAA, ANIONGAP in the last 168 hours.  No results for input(s): PROT, ALBUMIN, AST, ALT, ALKPHOS, BILITOT in the last 168 hours. HematologyNo results for input(s): WBC, RBC, HGB, HCT, MCV, MCH, MCHC, RDW, PLT in the last 168 hours. BNPNo results for input(s): BNP, PROBNP in the last 168 hours.  DDimer No results for input(s): DDIMER in the last 168 hours.   Radiology/Studies:  No results found.  Assessment and Plan:   1. Murmur:  Benign no symptoms f/u echo r/o valvular disease AV sclerosis  2. Cough: non cardiac f/u ENT and allergy avoid ACE/ARB 3. GERD: continue prilosec 4. HTN:  Well controlled.  Continue current medications and low sodium Dash type diet.    F/U in a year   For questions or updates, please contact [place] Please  consult [email or web address] for contact info under     Signed, Jenkins Rouge, MD  07/09/2019 9:46 AM

## 2019-07-08 ENCOUNTER — Telehealth: Payer: Self-pay | Admitting: Cardiovascular Disease

## 2019-07-08 NOTE — Telephone Encounter (Signed)
[place]

## 2019-07-08 NOTE — Telephone Encounter (Signed)
Pt states her cough is persistent and has lasted over a year. This is likely not COVID related. Pt is okay to come in for her OV on 7/15.

## 2019-07-08 NOTE — Telephone Encounter (Signed)
New Message         COVID-19 Pre-Screening Questions:   In the past 7 to 10 days have you had a cough,  shortness of breath, headache, congestion, fever (100 or greater) body aches, chills, sore throat, or sudden loss of taste or sense of smell? Cough that she says she has had for over a year   Have you been around anyone with known Covid 19.  NO  Have you been around anyone who is awaiting Covid 19 test results in the past 7 to 10 days? NO  Have you been around anyone who has been exposed to Covid 19, or has mentioned symptoms of Covid 19 within the past 7 to 10 days? NO  If you have any concerns/questions about symptoms patients report during screening (either on the phone or at threshold). Contact the provider seeing the patient or DOD for further guidance.  If neither are available contact a member of the leadership team.

## 2019-07-09 ENCOUNTER — Encounter: Payer: Self-pay | Admitting: Cardiovascular Disease

## 2019-07-09 ENCOUNTER — Other Ambulatory Visit: Payer: Self-pay

## 2019-07-09 ENCOUNTER — Ambulatory Visit (INDEPENDENT_AMBULATORY_CARE_PROVIDER_SITE_OTHER): Payer: Medicare Other | Admitting: Cardiovascular Disease

## 2019-07-09 VITALS — BP 138/88 | HR 94 | Ht 60.0 in | Wt 154.6 lb

## 2019-07-09 DIAGNOSIS — L821 Other seborrheic keratosis: Secondary | ICD-10-CM | POA: Diagnosis not present

## 2019-07-09 DIAGNOSIS — C44629 Squamous cell carcinoma of skin of left upper limb, including shoulder: Secondary | ICD-10-CM | POA: Diagnosis not present

## 2019-07-09 DIAGNOSIS — R011 Cardiac murmur, unspecified: Secondary | ICD-10-CM

## 2019-07-09 NOTE — Patient Instructions (Signed)
Medication Instructions:  No changes If you need a refill on your cardiac medications before your next appointment, please call your pharmacy.   Lab work: none If you have labs (blood work) drawn today and your tests are completely normal, you will receive your results only by: Marland Kitchen MyChart Message (if you have MyChart) OR . A paper copy in the mail If you have any lab test that is abnormal or we need to change your treatment, we will call you to review the results.  Testing/Procedures: Your physician has requested that you have an echocardiogram. Echocardiography is a painless test that uses sound waves to create images of your heart. It provides your doctor with information about the size and shape of your heart and how well your heart's chambers and valves are working. This procedure takes approximately one hour. There are no restrictions for this procedure.    Follow-Up: At [facility], you and your health needs are our priority.  As part of our continuing mission to provide you with exceptional heart care, we have created designated Provider Care Teams.  These Care Teams include your primary Cardiologist (physician) and Advanced Practice Providers (APPs -  Physician Assistants and Nurse Practitioners) who all work together to provide you with the care you need, when you need it. You will need a follow up appointment in 12 months.  Please call our office 2 months in advance to schedule this appointment.  You may see Dr. Jenkins Rouge or one of the following Advanced Practice Providers on your designated Care Team:   Truitt Merle, NP Cecilie Kicks, NP . Kathyrn Drown, NP  Any Other Special Instructions Will Be Listed Below (If Applicable).

## 2019-07-10 ENCOUNTER — Ambulatory Visit: Payer: Medicare Other | Admitting: Cardiovascular Disease

## 2019-07-17 DIAGNOSIS — G4733 Obstructive sleep apnea (adult) (pediatric): Secondary | ICD-10-CM | POA: Diagnosis not present

## 2019-07-18 DIAGNOSIS — M62838 Other muscle spasm: Secondary | ICD-10-CM | POA: Diagnosis not present

## 2019-07-18 DIAGNOSIS — K5900 Constipation, unspecified: Secondary | ICD-10-CM | POA: Diagnosis not present

## 2019-07-18 DIAGNOSIS — M6281 Muscle weakness (generalized): Secondary | ICD-10-CM | POA: Diagnosis not present

## 2019-07-21 ENCOUNTER — Other Ambulatory Visit: Payer: Self-pay

## 2019-07-21 ENCOUNTER — Ambulatory Visit (HOSPITAL_COMMUNITY): Payer: Medicare Other | Attending: Cardiovascular Disease

## 2019-07-21 DIAGNOSIS — G4733 Obstructive sleep apnea (adult) (pediatric): Secondary | ICD-10-CM | POA: Diagnosis not present

## 2019-07-21 DIAGNOSIS — R011 Cardiac murmur, unspecified: Secondary | ICD-10-CM | POA: Diagnosis not present

## 2019-07-22 ENCOUNTER — Other Ambulatory Visit: Payer: Self-pay | Admitting: Family Medicine

## 2019-07-22 DIAGNOSIS — Z1231 Encounter for screening mammogram for malignant neoplasm of breast: Secondary | ICD-10-CM

## 2019-07-28 DIAGNOSIS — M9903 Segmental and somatic dysfunction of lumbar region: Secondary | ICD-10-CM | POA: Diagnosis not present

## 2019-07-28 DIAGNOSIS — M9901 Segmental and somatic dysfunction of cervical region: Secondary | ICD-10-CM | POA: Diagnosis not present

## 2019-07-28 DIAGNOSIS — M545 Low back pain: Secondary | ICD-10-CM | POA: Diagnosis not present

## 2019-07-28 DIAGNOSIS — M542 Cervicalgia: Secondary | ICD-10-CM | POA: Diagnosis not present

## 2019-07-30 ENCOUNTER — Telehealth (INDEPENDENT_AMBULATORY_CARE_PROVIDER_SITE_OTHER): Payer: Medicare Other | Admitting: Family Medicine

## 2019-07-30 ENCOUNTER — Other Ambulatory Visit: Payer: Self-pay

## 2019-07-30 ENCOUNTER — Encounter: Payer: Self-pay | Admitting: Family Medicine

## 2019-07-30 VITALS — Temp 100.8°F

## 2019-07-30 DIAGNOSIS — Z20822 Contact with and (suspected) exposure to covid-19: Secondary | ICD-10-CM

## 2019-07-30 DIAGNOSIS — M7918 Myalgia, other site: Secondary | ICD-10-CM | POA: Diagnosis not present

## 2019-07-30 DIAGNOSIS — R05 Cough: Secondary | ICD-10-CM

## 2019-07-30 DIAGNOSIS — R509 Fever, unspecified: Secondary | ICD-10-CM | POA: Diagnosis not present

## 2019-07-30 DIAGNOSIS — R5383 Other fatigue: Secondary | ICD-10-CM | POA: Diagnosis not present

## 2019-07-30 NOTE — Progress Notes (Signed)
Virtual Visit via Video Note  I connected with Gloria Werner on 07/30/19 at  1:30 PM EDT by a video enabled telemedicine application and verified that I am speaking with the correct person using two identifiers. Location patient: home Location provider: home office Persons participating in the virtual visit: patient, provider  I discussed the limitations of evaluation and management by telemedicine and the availability of in person appointments. The patient expressed understanding and agreed to proceed.  Chief Complaint  Patient presents with  . Fever    Pt agrees to virtual visit. Pt is C/O a fever that started on 8.4.20 and has been up to 100.8. She has nasal congestion as of this am, post-nasal-drip.  She has a cough since May 2019 and no Dx for it. She does have body aches, fatigue, sleeping more than normal.      HPI: Gloria Werner is a 78 y.o. female who is a patient of Dr. Deborra Medina. Pt complains of fever since yesterday with Tmax 100.8. + chills. Today she has developed nasal congestion and PND. She also endorses myalgias and fatigue. + cough - this has been present x 2-38mo and unchanged.  Pt took a dose of tylenol last night. Temp this AM was 99.5. No known sick contacts. She lives with her husband who is fine/asymptomatic.  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 She wears a mask as do all participants.    Past Medical History:  Diagnosis Date  . Allergic rhinitis due to pollen   . Complication of anesthesia   . Cough   . Gastroesophageal reflux disease without esophagitis   . Irritable bowel syndrome   . 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 PONV (postoperative nausea and vomiting)     Past Surgical History:  Procedure Laterality Date  . ABDOMINAL HYSTERECTOMY  1988   secondary to prolapse  . BLADDER SUSPENSION     A-P with Hyst  . INSERTION OF MESH  10/16/2018   Procedure: INSERTION OF MESH;  Surgeon: Ardis Hughs, MD;  Location:  [facility];  Service: Urology;;  . NASAL SINUS SURGERY  1970  . ROBOTIC ASSISTED LAPAROSCOPIC SACROCOLPOPEXY N/A 10/16/2018   Procedure: XI ROBOTIC ASSISTED LAPAROSCOPIC SACROCOLPOPEXY;  Surgeon: Ardis Hughs, MD;  Location: [facility];  Service: Urology;  Laterality: N/A;  . TONSILLECTOMY      Family History  Problem Relation Age of Onset  . Emphysema Mother   . Lymphoma Mother   . Asthma Mother   . Cancer Father 23       lung cancer  . Coronary artery disease Brother   . Cancer Brother        breast cancer  . Cancer Brother        lung cancer  . Diabetes Neg Hx   . Hypertension Neg Hx   . Colon cancer Neg Hx     Social History   Tobacco Use  . Smoking status: Never Smoker  . Smokeless tobacco: Never Used  Substance Use Topics  . Alcohol use: No  . Drug use: No     Current Outpatient Medications:  .  amLODipine (NORVASC) 5 MG tablet, Take 1 tablet (5 mg total) by mouth daily., Disp: 90 tablet, Rfl: 3 .  azelastine (ASTELIN) 0.1 % nasal spray, Place 1 spray into both nostrils 2 (two) times daily as needed for rhinitis or allergies. Use in each nostril as directed, Disp: 30 mL, Rfl: 1 .  Azelastine-Fluticasone (DYMISTA) 137-50  MCG/ACT SUSP, Place 1 spray into both nostrils daily., Disp: 23 g, Rfl: 5 .  buPROPion (WELLBUTRIN) 75 MG tablet, Take 1 tablet (75 mg total) by mouth 2 (two) times daily., Disp: 180 tablet, Rfl: 3 .  calcium carbonate (OSCAL) 1500 (600 Ca) MG TABS tablet, Take by mouth daily with breakfast., Disp: , Rfl:  .  Carboxymethylcellul-Glycerin (LUBRICATING EYE DROPS OP), Place 1 drop into both eyes daily as needed (dry eyes)., Disp: , Rfl:  .  chlorpheniramine (CHLOR-TRIMETON) 2 MG/5ML syrup, Take 2 mg by mouth every 4 (four) hours as needed for allergies., Disp: , Rfl:  .  ergocalciferol (VITAMIN D2) 1.25 MG (50000 UT) capsule, Take 50,000 Units by mouth every 30 (thirty) days. , Disp: , Rfl:  .  Multiple Vitamin (MULTIVITAMIN) tablet, Take 1 tablet by  mouth daily., Disp: , Rfl:  .  saccharomyces boulardii (FLORASTOR) 250 MG capsule, Take 2 capsules (500 mg total) by mouth 2 (two) times daily., Disp: 120 capsule, Rfl: 3 .  triamcinolone (NASACORT) 55 MCG/ACT AERO nasal inhaler, Place 2 sprays into the nose daily., Disp: , Rfl:   Allergies  Allergen Reactions  . Ace Inhibitors Cough  . Codeine Nausea And Vomiting  . Lexapro [Escitalopram] Diarrhea and Other (See Comments)    headache      ROS: See pertinent positives and negatives per HPI.   EXAM:  VITALS per patient if applicable: Temp (!) 161.0 F (38.2 C) (Oral)   LMP 12/25/1986 (Approximate)    GENERAL: alert, oriented, appears well and in no acute distress  HEENT: atraumatic, conjunctiva clear, no obvious abnormalities on inspection of external nose and ears  NECK: normal movements of the head and neck  LUNGS: on inspection no signs of respiratory distress, breathing rate appears normal, no obvious gross SOB, gasping or wheezing, no conversational dyspnea  CV: no obvious cyanosis  MS: moves all visible extremities without noticeable abnormality  PSYCH/NEURO: pleasant and cooperative, no obvious depression or anxiety, speech and thought processing grossly intact   ASSESSMENT AND PLAN: 1. 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 The patient was provided an opportunity to ask questions and all were answered. The patient agreed with the plan and demonstrated an understanding of the instructions.   The patient was advised to call back or seek an in-person evaluation if the symptoms worsen or if the condition  fails to improve as anticipated.   [name], DO

## 2019-07-31 LAB — NOVEL CORONAVIRUS, NAA: SARS-CoV-2, NAA: NOT DETECTED

## 2019-08-04 ENCOUNTER — Ambulatory Visit (INDEPENDENT_AMBULATORY_CARE_PROVIDER_SITE_OTHER): Payer: Medicare Other | Admitting: Family Medicine

## 2019-08-04 ENCOUNTER — Encounter: Payer: Self-pay | Admitting: Family Medicine

## 2019-08-04 ENCOUNTER — Other Ambulatory Visit: Payer: Self-pay

## 2019-08-04 DIAGNOSIS — J301 Allergic rhinitis due to pollen: Secondary | ICD-10-CM | POA: Diagnosis not present

## 2019-08-04 MED ORDER — ALBUTEROL SULFATE HFA 108 (90 BASE) MCG/ACT IN AERS: 2.0000 | INHALATION_SPRAY | Freq: Four times a day (QID) | RESPIRATORY_TRACT | 0 refills | Status: DC | PRN

## 2019-08-04 NOTE — Patient Instructions (Signed)
Try albuterol inhaler as needed for cough. Keep [name] updated on whether this is helpful or not.    How to Use a Metered Dose Inhaler A metered dose inhaler is a handheld device for taking medicine that must be breathed into the lungs (inhaled). The device can be used to deliver a variety of inhaled medicines, including:  Quick relief or rescue medicines, such as bronchodilators.  Controller medicines, such as corticosteroids. The medicine is delivered by pushing down on a metal canister to release a preset amount of spray and medicine. Each device contains the amount of medicine that is needed for a preset number of uses (inhalations). Your health care provider may recommend that you use a spacer with your inhaler to help you take the medicine more effectively. A spacer is a plastic tube with a mouthpiece on one end and an opening that connects to the inhaler on the other end. A spacer holds the medicine in a tube for a short time, which allows you to inhale more medicine. What are the risks? If you do not use your inhaler correctly, medicine might not reach your lungs to help you breathe. Inhaler medicine can cause side effects, such as:  Mouth or throat infection.  Cough.  Hoarseness.  Headache.  Nausea and vomiting.  Lung infection (pneumonia) in people who have a lung condition called COPD. How to use a metered dose inhaler without a spacer  1. Remove the cap from the inhaler. 2. If you are using the inhaler for the first time, shake it for 5 seconds, turn it away from your face, then release 4 puffs into the air. This is called priming. 3. Shake the inhaler for 5 seconds. 4. Position the inhaler so the top of the canister faces up. 5. Put your index finger on the top of the medicine canister. Support the bottom of the inhaler with your thumb. 6. Breathe out normally and as completely as possible, away from the inhaler. 7. Either place the inhaler between your teeth and close your  lips tightly around the mouthpiece, or hold the inhaler 1-2 inches (2.5-5 cm) away from your open mouth. Keep your tongue down out of the way. If you are unsure which technique to use, ask your health care provider. 8. Press the canister down with your index finger to release the medicine, then inhale deeply and slowly through your mouth (not your nose) until your lungs are completely filled. Inhaling should take 4-6 seconds. 9. Hold the medicine in your lungs for 5-10 seconds (10 seconds is best). This helps the medicine get into the small airways of your lungs. 10. With your lips in a tight circle (pursed), breathe out slowly. 11. Repeat steps 3-10 until you have taken the number of puffs that your health care provider directed. Wait about 1 minute between puffs or as directed. 12. Put the cap on the inhaler. 13. If you are using a steroid inhaler, rinse your mouth with water, gargle, and spit out the water. Do not swallow the water. How to use a metered dose inhaler with a spacer  1. Remove the cap from the inhaler. 2. If you are using the inhaler for the first time, shake it for 5 seconds, turn it away from your face, then release 4 puffs into the air. This is called priming. 3. Shake the inhaler for 5 seconds. 4. Place the open end of the spacer onto the inhaler mouthpiece. 5. Position the inhaler so the top of the canister  faces up and the spacer mouthpiece faces you. 6. Put your index finger on the top of the medicine canister. Support the bottom of the inhaler and the spacer with your thumb. 7. Breathe out normally and as completely as possible, away from the spacer. 8. Place the spacer between your teeth and close your lips tightly around it. Keep your tongue down out of the way. 9. Press the canister down with your index finger to release the medicine, then inhale deeply and slowly through your mouth (not your nose) until your lungs are completely filled. Inhaling should take 4-6 seconds.  10. Hold the medicine in your lungs for 5-10 seconds (10 seconds is best). This helps the medicine get into the small airways of your lungs. 11. With your lips in a tight circle (pursed), breathe out slowly. 12. Repeat steps 3-11 until you have taken the number of puffs that your health care provider directed. Wait about 1 minute between puffs or as directed. 13. Remove the spacer from the inhaler and put the cap on the inhaler. 14. If you are using a steroid inhaler, rinse your mouth with water, gargle, and spit out the water. Do not swallow the water. Follow these instructions at home:  Take your inhaled medicine only as told by your health care provider. Do not use the inhaler more than directed by your health care provider.  Keep all follow-up visits as told by your health care provider. This is important.  If your inhaler has a counter, you can check it to determine how full your inhaler is. If your inhaler does not have a counter, ask your health care provider when you will need to refill your inhaler and write the refill date on a calendar or on your inhaler canister. Note that you cannot know when an inhaler is empty by shaking it.  Follow directions on the package insert for care and cleaning of your inhaler and spacer. Contact a health care provider if:  Symptoms are only partially relieved with your inhaler.  You are having trouble using your inhaler.  You have an increase in phlegm.  You have headaches. Get help right away if:  You feel little or no relief after using your inhaler.  You have dizziness.  You have a fast heart rate.  You have chills or a fever.  You have night sweats.  There is blood in your phlegm. Summary  A metered dose inhaler is a handheld device for taking medicine that must be breathed into the lungs (inhaled).  The medicine is delivered by pushing down on a metal canister to release a preset amount of spray and medicine.  Each device  contains the amount of medicine that is needed for a preset number of uses (inhalations). This information is not intended to replace advice given to you by your health care provider. Make sure you discuss any questions you have with your health care provider. Document Released: 12/11/2005 Document Revised: 11/23/2017 Document Reviewed: 10/31/2016 Elsevier Patient Education  2020 [employer].

## 2019-08-07 ENCOUNTER — Encounter: Payer: Self-pay | Admitting: Family Medicine

## 2019-08-07 NOTE — Progress Notes (Signed)
Gloria Werner - 78 y.o. female MRN [id number]  Date of birth: May 22, 1941  Subjective Chief Complaint  Patient presents with  . Sore Throat    white patches on back of throat/1 day/ on-going cough/ getting harder to breath when active     HPI Gloria Werner is a 78 y.o. female here today with complaint of scratchy throat with cough and feeling of shortness of breath when active.  Symptoms started a few days ago.  She noticed what she things are "White patches" in the back of her throat today. She does have a history of seasonal allergies. She denies fever, chills, headache, nausea, vomiting, diarrhea, rash, chest pain or tightness.  She is not sure if she has had wheezing.   ROS:  A comprehensive ROS was completed and negative except as noted per HPI  Allergies  Allergen Reactions  . Ace Inhibitors Cough  . Codeine Nausea And Vomiting  . Lexapro [Escitalopram] Diarrhea and Other (See Comments)    headache    Past Medical History:  Diagnosis Date  . Allergic rhinitis due to pollen   . Complication of anesthesia   . Cough   . Gastroesophageal reflux disease without esophagitis   . Irritable bowel syndrome   . 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 PONV (postoperative nausea and vomiting)     Past Surgical History:  Procedure Laterality Date  . ABDOMINAL HYSTERECTOMY  1988   secondary to prolapse  . BLADDER SUSPENSION     A-P with Hyst  . INSERTION OF MESH  10/16/2018   Procedure: INSERTION OF MESH;  Surgeon: Ardis Hughs, MD;  Location: [facility];  Service: Urology;;  . NASAL SINUS SURGERY  1970  . ROBOTIC ASSISTED LAPAROSCOPIC SACROCOLPOPEXY N/A 10/16/2018   Procedure: XI ROBOTIC ASSISTED LAPAROSCOPIC SACROCOLPOPEXY;  Surgeon: Ardis Hughs, MD;  Location: [facility];  Service: Urology;  Laterality: N/A;  . TONSILLECTOMY      Social History   Socioeconomic History  . Marital status: Married    Spouse name: Not on file  . Number of children: 2  . Years  of education: 71  . Highest education level: Not on file  Occupational History  . Occupation: Environmental health practitioner  Social Needs  . Financial resource strain: Not on file  . Food insecurity    Worry: Not on file    Inability: Not on file  . Transportation needs    Medical: Not on file    Non-medical: Not on file  Tobacco Use  . Smoking status: Never Smoker  . Smokeless tobacco: Never Used  Substance and Sexual Activity  . Alcohol use: No  . Drug use: No  . Sexual activity: Yes    Partners: Male    Birth control/protection: Surgical  Lifestyle  . Physical activity    Days per week: Not on file    Minutes per session: Not on file  . Stress: Not on file  Relationships  . Social Herbalist on phone: Not on file    Gets together: Not on file    Attends religious service: Not on file    Active member of club or organization: Not on file    Attends meetings of clubs or organizations: Not on file    Relationship status: Not on file  Other Topics Concern  . Not on file  Social History Narrative   HSG. Married '63. 1 son- 76'; 1-daughter-'68; grandchildren 3. 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 Marriage in good health. 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 Provided lead to [email or web address].    Family History  Problem Relation Age of Onset  . Emphysema Mother   . Lymphoma Mother   . Asthma Mother   . Cancer Father 34       lung cancer  . Coronary artery disease Brother   . Cancer Brother        breast cancer  . Cancer Brother        lung cancer  . Diabetes Neg Hx   . Hypertension Neg Hx   . Colon cancer Neg Hx     Health Maintenance  Topic Date Due  . MAMMOGRAM  04/16/2019  . INFLUENZA VACCINE  07/26/2019  . Fecal DNA (Cologuard)  10/02/2021  . TETANUS/TDAP  10/22/2022  . DEXA SCAN  Completed  . PNA vac Low Risk Adult  Completed     ----------------------------------------------------------------------------------------------------------------------------------------------------------------------------------------------------------------- Physical Exam BP (!) 148/88   Pulse 98   Temp (!) 96.3 F (35.7 C) (Temporal)   Ht 5' (1.524 m)   Wt 151 lb 9.6 oz (68.8 kg)   LMP 12/25/1986 (Approximate)   SpO2 97%   BMI 29.61 kg/m   Physical Exam Constitutional:      Appearance: She is well-developed.  HENT:     Head: Normocephalic and atraumatic.     Right Ear: Tympanic membrane normal.     Left Ear: Tympanic membrane normal.     Mouth/Throat:     Mouth: Mucous membranes are moist.     Pharynx: Uvula midline. No pharyngeal swelling, oropharyngeal exudate or posterior oropharyngeal erythema.     Tonsils: No tonsillar exudate or tonsillar abscesses.  Cardiovascular:     Rate and Rhythm: Normal rate and regular rhythm.  Pulmonary:     Effort: Pulmonary effort is normal.     Breath sounds: Normal breath sounds.  Skin:    General: Skin is warm and dry.  Neurological:     General: No focal deficit present.     Mental Status: She is alert.     ------------------------------------------------------------------------------------------------------------------------------------------------------------------------------------------------------------------- Assessment and Plan  ALLERGIC RHINITIS, SEASONAL -Scratchy sensation in throat likely caused by PND related to allergies. No areas concerning for thrush or bacterial pharyngitis.   -Suspect she may have reactive airway component that his causing her cough and intermittent shortness of breath.  Will give her a trial of albuterol inhaler.  -She is instructed to follow up for any newly developing or worsening symptoms.  She expresses understanding.

## 2019-08-07 NOTE — Assessment & Plan Note (Signed)
-  Scratchy sensation in throat likely caused by PND related to allergies. No areas concerning for thrush or bacterial pharyngitis.   -Suspect she may have reactive airway component that his causing her cough and intermittent shortness of breath.  Will give her a trial of albuterol inhaler.  -She is instructed to follow up for any newly developing or worsening symptoms.  She expresses understanding.

## 2019-08-13 DIAGNOSIS — K5900 Constipation, unspecified: Secondary | ICD-10-CM | POA: Diagnosis not present

## 2019-08-13 DIAGNOSIS — M62838 Other muscle spasm: Secondary | ICD-10-CM | POA: Diagnosis not present

## 2019-08-13 DIAGNOSIS — M6281 Muscle weakness (generalized): Secondary | ICD-10-CM | POA: Diagnosis not present

## 2019-08-21 DIAGNOSIS — G4733 Obstructive sleep apnea (adult) (pediatric): Secondary | ICD-10-CM | POA: Diagnosis not present

## 2019-08-25 ENCOUNTER — Ambulatory Visit: Payer: Medicare Other | Admitting: Pulmonary Disease

## 2019-08-27 ENCOUNTER — Encounter: Payer: Self-pay | Admitting: Pulmonary Disease

## 2019-08-27 ENCOUNTER — Other Ambulatory Visit: Payer: Self-pay

## 2019-08-27 ENCOUNTER — Ambulatory Visit (INDEPENDENT_AMBULATORY_CARE_PROVIDER_SITE_OTHER): Payer: Medicare Other | Admitting: Pulmonary Disease

## 2019-08-27 ENCOUNTER — Ambulatory Visit: Payer: Medicare Other | Admitting: Pulmonary Disease

## 2019-08-27 DIAGNOSIS — R05 Cough: Secondary | ICD-10-CM

## 2019-08-27 DIAGNOSIS — R059 Cough, unspecified: Secondary | ICD-10-CM

## 2019-08-27 MED ORDER — IPRATROPIUM BROMIDE 0.03 % NA SOLN: 2.0000 | Freq: Two times a day (BID) | NASAL | 5 refills | Status: DC

## 2019-08-27 NOTE — Progress Notes (Signed)
Gloria Werner    RK:[id number]    01-12-1941  Primary Care Physician:Aron, Marciano Sequin, MD  Referring Physician: Lucille Passy, MD [place],  [place] [id number]  Chief complaint: Follow up for cough  HPI: 78 year old with history of hypertension.  Complains of chronic cough for the past 5 months.  She was started on lisinopril 06/03/2018 and notes she started developing cough within 2 to 4 days after this. Tried treatment for postnasal drip with Claritin, Zyrtec, Xyzal, Nasacort for acid reflux with H2 blocker and PPI Taken off lisinopril in 2019 and started on Norvasc by primary care Tried on trial Symbicort in 2019 without any improvement in symptoms.  Pets: Has a dog, no cats, birds, farm animals. 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 Exposures: No mold, hot tub, Jacuzzi, humidifier Smoking history: Never smoker Travel history: No significant travel Relevant family history: Mother had emphysema, asthma.  Interim history: Seen by Dr. 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  Treated with omeprazole and famotidine at that time.  Also given Dymista and montelukast Seen by ENT, Dr. Wilburn Cornelia in June 2020 who noted post glottic erythema consistent with gastroesophageal reflux  Patient does not believe she has significant GERD.  She had tried antiacid medication for 2 months without any benefit and does not want to go back on it.  Chief complaint is chronic cough with clear mucus, postnasal drip, irritation of the back of the throat and need to constantly clear her throat.  Denies any acid reflux, dyspnea, wheezing, fevers, chills.  Outpatient Encounter Medications as of 08/27/2019  Medication Sig  . albuterol (VENTOLIN HFA) 108 (90 Base) MCG/ACT inhaler Inhale 2 puffs into the lungs every 6 (six) hours as needed for shortness of breath (cough).  Marland Kitchen amLODipine (NORVASC) 5 MG tablet Take 1 tablet (5 mg total) by mouth daily.  Marland Kitchen  azelastine (ASTELIN) 0.1 % nasal spray Place 1 spray into both nostrils 2 (two) times daily as needed for rhinitis or allergies. Use in each nostril as directed  . buPROPion (WELLBUTRIN) 75 MG tablet Take 1 tablet (75 mg total) by mouth 2 (two) times daily.  . calcium carbonate (OSCAL) 1500 (600 Ca) MG TABS tablet Take by mouth daily with breakfast.  . Carboxymethylcellul-Glycerin (LUBRICATING EYE DROPS OP) Place 1 drop into both eyes daily as needed (dry eyes).  . chlorpheniramine (CHLOR-TRIMETON) 2 MG/5ML syrup Take 2 mg by mouth every 4 (four) hours as needed for allergies.  Marland Kitchen ergocalciferol (VITAMIN D2) 1.25 MG (50000 UT) capsule Take 50,000 Units by mouth every 30 (thirty) days.   . Multiple Vitamin (MULTIVITAMIN) tablet Take 1 tablet by mouth daily.  Marland Kitchen saccharomyces boulardii (FLORASTOR) 250 MG capsule Take 2 capsules (500 mg total) by mouth 2 (two) times daily.  Marland Kitchen triamcinolone (NASACORT) 55 MCG/ACT AERO nasal inhaler Place 2 sprays into the nose daily.  . [DISCONTINUED] Azelastine-Fluticasone (DYMISTA) 137-50 MCG/ACT SUSP Place 1 spray into both nostrils daily.   No facility-administered encounter medications on file as of 08/27/2019.    Physical Exam: Blood pressure 126/72, pulse 80, temperature (!) 97.2 F (36.2 C), height 5' (1.524 m), weight 153 lb 12.8 oz (69.8 kg), last menstrual period 12/25/1986, SpO2 98 %. Gen:      No acute distress HEENT:  EOMI, sclera anicteric Neck:     No masses; no thyromegaly Lungs:    Clear to auscultation bilaterally; normal respiratory effort CV:  Regular rate and rhythm; no murmurs Abd:      + bowel sounds; soft, non-tender; no palpable masses, no distension Ext:    No edema; adequate peripheral perfusion Skin:      Warm and dry; no rash Neuro: alert and oriented x 3 Psych: normal mood and affect  Data Reviewed: Imaging: CT abdomen pelvis 02/02/2010-4 mm right subpleural lung nodule.  No acute pulmonary findings CT abdomen pelvis  10/21/2015-stable right pleural lung nodule, no acute lung findings Chest x-ray 08/22/2018- no acute cardiopulmonary abnormality CT abdomen pelvis 09/13/2018- 4-5 mm right lung nodule.  No acute findings. I have reviewed the images personally.  PFTs: 11/27/2018-FVC 3.02 (128%), FEV1 2.56 [145%), F/F 85, TLC 121%, DLCO 112% Normal test  01/21/2019 FVC 2.97 [128%], FEV1 2.49 [144%], F/F 84  FENO 10/01/2018-13  Labs: CBC 09/04/2018-WBC 4.7, eos 4.2%, absolute eosinophil count 197 CBC 10/01/2018-WBC 5.6, eos 4.6%, absolute eosinophil count 58 IgE 10/01/2018-53  Sleep PSG 01/11/2019- mild obstructive sleep apnea, AHI 8.3  Assessment:  Chronic cough Likely has upper airway cough from postnasal drip with contribution from GERD  Continue the Flonase, Astelin nasal spray We will add Atrovent. Instructed her to use chlorpheniramine 2 tablets 3 times daily for maximal benefit She does not want to retry antiacid medication.  PFTs reviewed with no significant obstruction.  There is no clear evidence of asthma.  However there is improvement in mid flow rates suggestive of small airway disease.  She did not respond to Symbicort inhaler She has persistent symptoms we will schedule a high-resolution CT to rule out interstitial lung disease  I educated her on behavioral changes to deal with cough including conscious suppression of the urge to cough, use of throat lozenges.  OSA She has appears to be on AutoSet CPAP but it appears that her pressure is set at constant pressure of 15 We will change this to AutoSet 5-15  Lung nodule Stable from 2016 to 2019.  No further follow-up needed.  Plan/Recommendations: - Chlorpheniramine, Astelin, steroid nasal spray - Atrovent nasal spray - CT chest - AutoSet CPAP  Follow-up in 1 to 2 months.  Marshell Garfinkel MD [facility] 08/27/2019, 11:58 AM  CC: Lucille Passy, MD

## 2019-08-27 NOTE — Patient Instructions (Signed)
We will schedule you for high-resolution CT for evaluation of the lungs as you have persistent problems with cough  Continue the Flonase and Astelin nasal spray We will start you on Atrovent nasal spray Use the chlorpheniramine.  You need to use 2 tablets 3 times a day for maximal benefit Follow-up in 2 to 4 weeks

## 2019-09-08 ENCOUNTER — Ambulatory Visit
Admission: RE | Admit: 2019-09-08 | Discharge: 2019-09-08 | Disposition: A | Discharge status: Home / Self Care | Payer: Medicare Other | Source: Ambulatory Visit | Attending: Family Medicine | Admitting: Family Medicine

## 2019-09-08 ENCOUNTER — Other Ambulatory Visit: Payer: Self-pay

## 2019-09-08 DIAGNOSIS — Z1231 Encounter for screening mammogram for malignant neoplasm of breast: Secondary | ICD-10-CM | POA: Diagnosis not present

## 2019-09-09 NOTE — Progress Notes (Signed)
Virtual Visit via Video Note  I connected with patient on 09/10/19 at 10:15 AM EDT by audio enabled telemedicine application and verified that I am speaking with the correct person using two identifiers.   THIS ENCOUNTER IS A VIRTUAL VISIT DUE TO COVID-19 - PATIENT WAS NOT SEEN IN THE OFFICE. PATIENT HAS CONSENTED TO VIRTUAL VISIT / TELEMEDICINE VISIT   Location of patient: home  Location of provider: office  I discussed the limitations of evaluation and management by telemedicine and the availability of in person appointments. The patient expressed understanding and agreed to proceed.   Subjective:   Gloria Werner is a 78 y.o. female who presents for Medicare Annual (Subsequent) preventive examination.  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  Review of Systems:  Home Safety/Smoke Alarms: Feels safe in home. Smoke alarms in place.  Lives with husband in 1 story home.    Female:         Mammo-09/08/19       Dexa scan-  04/15/18       CCS- No longer doing routine screening due to age.    Objective:     Vitals:Unable to assess. This visit is enabled though telemedicine due to Covid 19.   Advanced Directives 09/10/2019 01/11/2019 10/16/2018 10/14/2018  Does Patient Have a Medical Advance Directive? Yes Yes Yes Yes  Type of Paramedic of [place];Living will [place];Living will [place];Living will [place];Living will  Does patient want to make changes to medical advance directive? No - Patient declined No - Patient declined No - Patient declined No - Patient declined  Copy of [place] in Chart? No - copy requested No - copy requested No - copy requested No - copy requested    Tobacco Social History   Tobacco Use  Smoking Status Never Smoker  Smokeless Tobacco Never Used     Counseling given: Not Answered   Clinical Intake:     Pain :  No/denies pain    Past Medical History:  Diagnosis Date  . Allergic rhinitis due to pollen   . Complication of anesthesia   . Cough   . Gastroesophageal reflux disease without esophagitis   . Irritable bowel syndrome   . 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 PONV (postoperative nausea and vomiting)    Past Surgical History:  Procedure Laterality Date  . ABDOMINAL HYSTERECTOMY  1988   secondary to prolapse  . BLADDER SUSPENSION     A-P with Hyst  . INSERTION OF MESH  10/16/2018   Procedure: INSERTION OF MESH;  Surgeon: Ardis Hughs, MD;  Location: [facility];  Service: Urology;;  . NASAL SINUS SURGERY  1970  . ROBOTIC ASSISTED LAPAROSCOPIC SACROCOLPOPEXY N/A 10/16/2018   Procedure: XI ROBOTIC ASSISTED LAPAROSCOPIC SACROCOLPOPEXY;  Surgeon: Ardis Hughs, MD;  Location: [facility];  Service: Urology;  Laterality: N/A;  . TONSILLECTOMY     Family History  Problem Relation Age of Onset  . Emphysema Mother   . Lymphoma Mother   . Asthma Mother   . Cancer Father 86       lung cancer  . Coronary artery disease Brother   . Cancer Brother        breast cancer  . Breast cancer Brother   . Cancer Brother        lung cancer  . Diabetes Neg Hx   . Hypertension Neg Hx   . Colon  cancer Neg Hx    Social History   Socioeconomic History  . Marital status: Married    Spouse name: Not on file  . Number of children: 2  . Years of education: 61  . Highest education level: Not on file  Occupational History  . Occupation: Environmental health practitioner  Social Needs  . Financial resource strain: Not on file  . Food insecurity    Worry: Not on file    Inability: Not on file  . Transportation needs    Medical: Not on file    Non-medical: Not on file  Tobacco Use  . Smoking status: Never Smoker  . Smokeless tobacco: Never Used  Substance and Sexual Activity  . Alcohol use: No  . Drug use: No  . Sexual activity: Yes    Partners: Male    Birth control/protection: Surgical  Lifestyle  .  Physical activity    Days per week: Not on file    Minutes per session: Not on file  . Stress: Not on file  Relationships  . Social Herbalist on phone: Not on file    Gets together: Not on file    Attends religious service: Not on file    Active member of club or organization: Not on file    Attends meetings of clubs or organizations: Not on file    Relationship status: Not on file  Other Topics Concern  . Not on file  Social History Narrative   HSG. Married '63. 1 son- 47'; 1-daughter-'68; grandchildren 3. 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 Marriage in good health. 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 Provided lead to [email or web address].    Outpatient Encounter Medications as of 09/10/2019  Medication Sig  . albuterol (VENTOLIN HFA) 108 (90 Base) MCG/ACT inhaler Inhale 2 puffs into the lungs every 6 (six) hours as needed for shortness of breath (cough).  Marland Kitchen amLODipine (NORVASC) 5 MG tablet Take 1 tablet (5 mg total) by mouth daily.  Marland Kitchen azelastine (ASTELIN) 0.1 % nasal spray Place 1 spray into both nostrils 2 (two) times daily as needed for rhinitis or allergies. Use in each nostril as directed  . buPROPion (WELLBUTRIN) 75 MG tablet Take 1 tablet (75 mg total) by mouth 2 (two) times daily.  . calcium carbonate (OSCAL) 1500 (600 Ca) MG TABS tablet Take by mouth daily with breakfast.  . Carboxymethylcellul-Glycerin (LUBRICATING EYE DROPS OP) Place 1 drop into both eyes daily as needed (dry eyes).  . chlorpheniramine (CHLOR-TRIMETON) 2 MG/5ML syrup Take 2 mg by mouth every 4 (four) hours as needed for allergies.  Marland Kitchen ergocalciferol (VITAMIN D2) 1.25 MG (50000 UT) capsule Take 50,000 Units by mouth every 30 (thirty) days.   Marland Kitchen ipratropium (ATROVENT) 0.03 % nasal spray Place 2 sprays into both nostrils every 12 (twelve) hours.  . Multiple Vitamin (MULTIVITAMIN) tablet Take 1 tablet by mouth daily.  Marland Kitchen  saccharomyces boulardii (FLORASTOR) 250 MG capsule Take 2 capsules (500 mg total) by mouth 2 (two) times daily.  Marland Kitchen triamcinolone (NASACORT) 55 MCG/ACT AERO nasal inhaler Place 2 sprays into the nose daily.   No facility-administered encounter medications on file as of 09/10/2019.     Activities of Daily Living In your present state of health, do you have any difficulty performing the following activities: 09/10/2019 10/16/2018  Hearing? N N  Vision? N N  Comment wears readers -  Difficulty concentrating or making decisions? N N  Walking or climbing stairs? N N  Dressing or bathing? N N  Doing errands, shopping? N N  Preparing Food and eating ? N -  Using the Toilet? N -  In the past six months, have you accidently leaked urine? N -  Do you have problems with loss of bowel control? N -  Managing your Medications? N -  Managing your Finances? N -  Housekeeping or managing your Housekeeping? N -  Some recent data might be hidden    Patient Care Team: Lucille Passy, MD as PCP - General (Family Medicine) Irene Shipper, MD (Gastroenterology) Thalia Bloodgood, [place] as Referring Physician (Optometry) Kem Boroughs, FNP as Nurse Practitioner (Nurse Practitioner)    Assessment:   This is a routine wellness examination for [place]. Physical assessment deferred to PCP.  Exercise Activities and Dietary recommendations Current Exercise Habits: Home exercise routine, Type of exercise: walking, Time (Minutes): 45, Frequency (Times/Week): 5, Weekly Exercise (Minutes/Week): 225, Intensity: Mild, Exercise limited by: None identified Diet (meal preparation, eat out, water intake, caffeinated beverages, dairy products, fruits and vegetables): Weight watchers      Goals    . Weight (lb) < 140 lb (63.5 kg)       Fall Risk Fall Risk  09/10/2019 08/22/2018 08/14/2017 06/21/2016 05/11/2014  Falls in the past year? 0 No No Yes No  Number falls in past yr: - - - 1 -  Injury with Fall? 0 - - No -    Depression Screen PHQ 2/9 Scores 09/10/2019 08/22/2018 08/14/2017 06/21/2016  PHQ - 2 Score 0 0 0 1  PHQ- 9 Score - - 0 -     Cognitive Function Ad8 score reviewed for issues:  Issues making decisions:no  Less interest in hobbies / activities:no  Repeats questions, stories (family complaining):no  Trouble using ordinary gadgets (microwave, computer, phone):no  Forgets the month or year: no  Mismanaging finances: no  Remembering appts:no  Daily problems with thinking and/or memory:no Ad8 score is=0        Immunization History  Administered Date(s) Administered  . H1N1 01/07/2009  . Influenza Whole 09/23/2012  . Influenza, High Dose Seasonal PF 10/11/2017  . Influenza,inj,Quad PF,6+ Mos 09/28/2015, 08/02/2016, 09/04/2018  . Influenza-Unspecified 08/25/2014  . Pneumococcal Conjugate-13 02/18/2014  . Pneumococcal Polysaccharide-23 10/22/2012  . Td 10/22/2012  . Zoster 11/28/2012  . Zoster Recombinat (Shingrix) 04/01/2019   Screening Tests Health Maintenance  Topic Date Due  . INFLUENZA VACCINE  07/26/2019  . MAMMOGRAM  09/07/2020  . Fecal DNA (Cologuard)  10/02/2021  . TETANUS/TDAP  10/22/2022  . DEXA SCAN  Completed  . PNA vac Low Risk Adult  Completed       Plan:    Please schedule your next medicare wellness visit with me in 1 yr.  Keep up the great work!  I have personally reviewed and noted the following in the patient's chart:   . Medical and social history . Use of alcohol, tobacco or illicit drugs  . Current medications and supplements . Functional ability and status . Nutritional status . Physical activity . Advanced directives . List of other physicians . Hospitalizations, surgeries, and ER visits in previous 12 months . Vitals . Screenings to include cognitive, depression, and falls . Referrals and appointments  In addition, I have reviewed and discussed with patient certain preventive protocols, quality metrics, and best practice  recommendations. A written personalized care plan for preventive services as well as general preventive health recommendations were provided to patient.  Shela Nevin, [place]  09/10/2019

## 2019-09-10 ENCOUNTER — Ambulatory Visit (INDEPENDENT_AMBULATORY_CARE_PROVIDER_SITE_OTHER): Payer: Medicare Other | Admitting: *Deleted

## 2019-09-10 ENCOUNTER — Encounter: Payer: Self-pay | Admitting: *Deleted

## 2019-09-10 DIAGNOSIS — Z0000 Encounter for general adult medical examination without abnormal findings: Secondary | ICD-10-CM | POA: Diagnosis not present

## 2019-09-10 NOTE — Patient Instructions (Signed)
Please schedule your next medicare wellness visit with me in 1 yr.  Keep up the great work!    Gloria Werner , Thank you for taking time to come for your Medicare Wellness Visit. I appreciate your ongoing commitment to your health goals. Please review the following plan we discussed and let me know if I can assist you in the future.   These are the goals we discussed: Goals    . Weight (lb) < 140 lb (63.5 kg)       This is a list of the screening recommended for you and due dates:  Health Maintenance  Topic Date Due  . Flu Shot  07/26/2019  . Mammogram  09/07/2020  . Cologuard (Stool DNA test)  10/02/2021  . Tetanus Vaccine  10/22/2022  . DEXA scan (bone density measurement)  Completed  . Pneumonia vaccines  Completed    Health Maintenance After Age 31 After age 82, you are at a higher risk for certain long-term diseases and infections as well as injuries from falls. Falls are a major cause of broken bones and head injuries in people who are older than age 79. Getting regular preventive care can help to keep you healthy and well. Preventive care includes getting regular testing and making lifestyle changes as recommended by your health care provider. Talk with your health care provider about:  Which screenings and tests you should have. A screening is a test that checks for a disease when you have no symptoms.  A diet and exercise plan that is right for you. What should I know about screenings and tests to prevent falls? Screening and testing are the best ways to find a health problem early. Early diagnosis and treatment give you the best chance of managing medical conditions that are common after age 40. Certain conditions and lifestyle choices may make you more likely to have a fall. Your health care provider may recommend:  Regular vision checks. Poor vision and conditions such as cataracts can make you more likely to have a fall. If you wear glasses, make sure to get your  prescription updated if your vision changes.  Medicine review. Work with your health care provider to regularly review all of the medicines you are taking, including over-the-counter medicines. Ask your health care provider about any side effects that may make you more likely to have a fall. Tell your health care provider if any medicines that you take make you feel dizzy or sleepy.  Osteoporosis screening. Osteoporosis is a condition that causes the bones to get weaker. This can make the bones weak and cause them to break more easily.  Blood pressure screening. Blood pressure changes and medicines to control blood pressure can make you feel dizzy.  Strength and balance checks. Your health care provider may recommend certain tests to check your strength and balance while standing, walking, or changing positions.  Foot health exam. Foot pain and numbness, as well as not wearing proper footwear, can make you more likely to have a fall.  Depression screening. You may be more likely to have a fall if you have a fear of falling, feel emotionally low, or feel unable to do activities that you used to do.  Alcohol use screening. Using too much alcohol can affect your balance and may make you more likely to have a fall. What actions can I take to lower my risk of falls? General instructions  Talk with your health care provider about your risks for falling. Tell  your health care provider if: ? You fall. Be sure to tell your health care provider about all falls, even ones that seem minor. ? You feel dizzy, sleepy, or off-balance.  Take over-the-counter and prescription medicines only as told by your health care provider. These include any supplements.  Eat a healthy diet and maintain a healthy weight. A healthy diet includes low-fat dairy products, low-fat (lean) meats, and fiber from whole grains, beans, and lots of fruits and vegetables. Home safety  Remove any tripping hazards, such as rugs,  cords, and clutter.  Install safety equipment such as grab bars in bathrooms and safety rails on stairs.  Keep rooms and walkways well-lit. Activity   Follow a regular exercise program to stay fit. This will help you maintain your balance. Ask your health care provider what types of exercise are appropriate for you.  If you need a cane or walker, use it as recommended by your health care provider.  Wear supportive shoes that have nonskid soles. Lifestyle  Do not drink alcohol if your health care provider tells you not to drink.  If you drink alcohol, limit how much you have: ? 0-1 drink a day for women. ? 0-2 drinks a day for men.  Be aware of how much alcohol is in your drink. In the [place] one drink equals one typical bottle of beer (12 oz), one-half glass of wine (5 oz), or one shot of hard liquor (1 oz).  Do not use any products that contain nicotine or tobacco, such as cigarettes and e-cigarettes. If you need help quitting, ask your health care provider. Summary  Having a healthy lifestyle and getting preventive care can help to protect your health and wellness after age 33.  Screening and testing are the best way to find a health problem early and help you avoid having a fall. Early diagnosis and treatment give you the best chance for managing medical conditions that are more common for people who are older than age 80.  Falls are a major cause of broken bones and head injuries in people who are older than age 48. Take precautions to prevent a fall at home.  Work with your health care provider to learn what changes you can make to improve your health and wellness and to prevent falls. This information is not intended to replace advice given to you by your health care provider. Make sure you discuss any questions you have with your health care provider. Document Released: 10/24/2017 Document Revised: 04/03/2019 Document Reviewed: 10/24/2017 Elsevier Patient Education  2020  [employer].

## 2019-09-15 DIAGNOSIS — M9901 Segmental and somatic dysfunction of cervical region: Secondary | ICD-10-CM | POA: Diagnosis not present

## 2019-09-15 DIAGNOSIS — M546 Pain in thoracic spine: Secondary | ICD-10-CM | POA: Diagnosis not present

## 2019-09-15 DIAGNOSIS — M542 Cervicalgia: Secondary | ICD-10-CM | POA: Diagnosis not present

## 2019-09-15 DIAGNOSIS — M9902 Segmental and somatic dysfunction of thoracic region: Secondary | ICD-10-CM | POA: Diagnosis not present

## 2019-09-16 ENCOUNTER — Encounter: Payer: Self-pay | Admitting: Family Medicine

## 2019-09-16 ENCOUNTER — Ambulatory Visit (INDEPENDENT_AMBULATORY_CARE_PROVIDER_SITE_OTHER): Payer: Medicare Other

## 2019-09-16 ENCOUNTER — Other Ambulatory Visit: Payer: Self-pay

## 2019-09-16 ENCOUNTER — Ambulatory Visit (INDEPENDENT_AMBULATORY_CARE_PROVIDER_SITE_OTHER): Payer: Medicare Other | Admitting: Family Medicine

## 2019-09-16 VITALS — BP 120/72 | HR 82 | Temp 98.7°F | Ht 60.0 in | Wt 155.6 lb

## 2019-09-16 DIAGNOSIS — M4184 Other forms of scoliosis, thoracic region: Secondary | ICD-10-CM | POA: Diagnosis not present

## 2019-09-16 DIAGNOSIS — R0789 Other chest pain: Secondary | ICD-10-CM | POA: Insufficient documentation

## 2019-09-16 DIAGNOSIS — R1031 Right lower quadrant pain: Secondary | ICD-10-CM

## 2019-09-16 DIAGNOSIS — M545 Low back pain, unspecified: Secondary | ICD-10-CM

## 2019-09-16 DIAGNOSIS — M47816 Spondylosis without myelopathy or radiculopathy, lumbar region: Secondary | ICD-10-CM | POA: Diagnosis not present

## 2019-09-16 DIAGNOSIS — R0781 Pleurodynia: Secondary | ICD-10-CM

## 2019-09-16 DIAGNOSIS — M4316 Spondylolisthesis, lumbar region: Secondary | ICD-10-CM | POA: Diagnosis not present

## 2019-09-16 DIAGNOSIS — M47814 Spondylosis without myelopathy or radiculopathy, thoracic region: Secondary | ICD-10-CM | POA: Diagnosis not present

## 2019-09-16 DIAGNOSIS — F338 Other recurrent depressive disorders: Secondary | ICD-10-CM

## 2019-09-16 DIAGNOSIS — Z23 Encounter for immunization: Secondary | ICD-10-CM | POA: Diagnosis not present

## 2019-09-16 NOTE — Patient Instructions (Signed)
Great to see you.  I will call you with your results.  

## 2019-09-16 NOTE — Assessment & Plan Note (Signed)
>  40 minutes spent in face to face time with patient, >50% spent in counselling or coordination of care.  Interesting that all of her areas of pain could be innervated by L1.  Will get lumbar and thoracic xrays and likely proceed to MRI.    The patient indicates understanding of these issues and agrees with the plan.

## 2019-09-16 NOTE — Progress Notes (Signed)
Subjective:   Patient ID: Gloria Werner, female    DOB: 01-09-1941, 78 y.o.   MRN: [id number]  Gloria Werner is a pleasant 78 y.o. year old female who presents to clinic today with Groin Pain (Pt is here today C/O right sided groin pain; pt. reports that it's an occasional pain and not present at this time)  on 09/16/2019  HPI: Right sided groin pain- comes and goes- has felt it a couple of months now.  Very painful when it occurs- can be as painful 10/10.  Not sure what makes it better or worse. Usually last 5- 10 minutes.  Anterior rib pain- can move from right to left.  And that has been going on longer than the groin pain. Always happens when bending over.  Right sided lumbar pain without radiculopathy.  Usually doing something physical when that happens.  Not quite as painful as the others- more of a nagging hurt.  Resting makes it better.  Has not tried anything for these pains because they come and go so quickly.  Scheduled for a chest CT on 9/28 by pulmonary.   Current Outpatient Medications on File Prior to Visit  Medication Sig Dispense Refill  . albuterol (VENTOLIN HFA) 108 (90 Base) MCG/ACT inhaler Inhale 2 puffs into the lungs every 6 (six) hours as needed for shortness of breath (cough). 18 g 0  . amLODipine (NORVASC) 5 MG tablet Take 1 tablet (5 mg total) by mouth daily. 90 tablet 3  . azelastine (ASTELIN) 0.1 % nasal spray Place 1 spray into both nostrils 2 (two) times daily as needed for rhinitis or allergies. Use in each nostril as directed 30 mL 1  . buPROPion (WELLBUTRIN) 75 MG tablet Take 1 tablet (75 mg total) by mouth 2 (two) times daily. 180 tablet 3  . calcium carbonate (OSCAL) 1500 (600 Ca) MG TABS tablet Take by mouth daily with breakfast.    . Carboxymethylcellul-Glycerin (LUBRICATING EYE DROPS OP) Place 1 drop into both eyes daily as needed (dry eyes).    . chlorpheniramine (CHLOR-TRIMETON) 2 MG/5ML syrup Take 2 mg by mouth every 4 (four) hours  as needed for allergies.    Marland Kitchen ergocalciferol (VITAMIN D2) 1.25 MG (50000 UT) capsule Take 50,000 Units by mouth every 30 (thirty) days.     Marland Kitchen ipratropium (ATROVENT) 0.03 % nasal spray Place 2 sprays into both nostrils every 12 (twelve) hours. 30 mL 5  . Multiple Vitamin (MULTIVITAMIN) tablet Take 1 tablet by mouth daily.    Marland Kitchen saccharomyces boulardii (FLORASTOR) 250 MG capsule Take 2 capsules (500 mg total) by mouth 2 (two) times daily. 120 capsule 3  . triamcinolone (NASACORT) 55 MCG/ACT AERO nasal inhaler Place 2 sprays into the nose daily.     No current facility-administered medications on file prior to visit.     Allergies  Allergen Reactions  . Ace Inhibitors Cough  . Codeine Nausea And Vomiting  . Lexapro [Escitalopram] Diarrhea and Other (See Comments)    headache    Past Medical History:  Diagnosis Date  . Allergic rhinitis due to pollen   . Complication of anesthesia   . Cough   . Gastroesophageal reflux disease without esophagitis   . Irritable bowel syndrome   . 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 PONV (postoperative nausea and vomiting)     Past Surgical History:  Procedure Laterality Date  . ABDOMINAL HYSTERECTOMY  1988   secondary to prolapse  . BLADDER SUSPENSION     A-P  with Hyst  . INSERTION OF MESH  10/16/2018   Procedure: INSERTION OF MESH;  Surgeon: Ardis Hughs, MD;  Location: [facility];  Service: Urology;;  . NASAL SINUS SURGERY  1970  . ROBOTIC ASSISTED LAPAROSCOPIC SACROCOLPOPEXY N/A 10/16/2018   Procedure: XI ROBOTIC ASSISTED LAPAROSCOPIC SACROCOLPOPEXY;  Surgeon: Ardis Hughs, MD;  Location: [facility];  Service: Urology;  Laterality: N/A;  . TONSILLECTOMY      Family History  Problem Relation Age of Onset  . Emphysema Mother   . Lymphoma Mother   . Asthma Mother   . Cancer Father 77       lung cancer  . Coronary artery disease Brother   . Cancer Brother        breast cancer  . Breast cancer Brother   . Cancer Brother        lung  cancer  . Diabetes Neg Hx   . Hypertension Neg Hx   . Colon cancer Neg Hx     Social History   Socioeconomic History  . Marital status: Married    Spouse name: Not on file  . Number of children: 2  . Years of education: 26  . Highest education level: Not on file  Occupational History  . Occupation: Environmental health practitioner  Social Needs  . Financial resource strain: Not on file  . Food insecurity    Worry: Not on file    Inability: Not on file  . Transportation needs    Medical: Not on file    Non-medical: Not on file  Tobacco Use  . Smoking status: Never Smoker  . Smokeless tobacco: Never Used  Substance and Sexual Activity  . Alcohol use: No  . Drug use: No  . Sexual activity: Yes    Partners: Male    Birth control/protection: Surgical  Lifestyle  . Physical activity    Days per week: Not on file    Minutes per session: Not on file  . Stress: Not on file  Relationships  . Social Herbalist on phone: Not on file    Gets together: Not on file    Attends religious service: Not on file    Active member of club or organization: Not on file    Attends meetings of clubs or organizations: Not on file    Relationship status: Not on file  . Intimate partner violence    Fear of current or ex partner: Not on file    Emotionally abused: Not on file    Physically abused: Not on file    Forced sexual activity: Not on file  Other Topics Concern  . Not on file  Social History Narrative   HSG. Married '63. 1 son- 37'; 1-daughter-'68; grandchildren 3. 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 Marriage in good health. 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 Provided lead to [email or web address].   The PMH, PSH, Social History, Family History, Medications, and allergies have been reviewed in [facility], and have been updated if relevant.  Review of Systems  Constitutional: Negative.   HENT: Negative.   Eyes:  Negative.   Respiratory: Positive for cough.   Cardiovascular: Negative for chest pain.  Gastrointestinal: Negative.   Endocrine: Negative.   Genitourinary: Negative.   Musculoskeletal: Positive for arthralgias and back pain.  Allergic/Immunologic: Negative.   Neurological: Negative.   Hematological: Negative.   Psychiatric/Behavioral: Positive for agitation.  All other systems reviewed and  are negative.      Objective:    BP 120/72   Pulse 82   Temp 98.7 F (37.1 C) (Oral)   Ht 5' (1.524 m)   Wt 155 lb 9.6 oz (70.6 kg)   LMP 12/25/1986 (Approximate)   SpO2 98%   BMI 30.39 kg/m   Wt Readings from Last 3 Encounters:  09/16/19 155 lb 9.6 oz (70.6 kg)  08/27/19 153 lb 12.8 oz (69.8 kg)  08/04/19 151 lb 9.6 oz (68.8 kg)    Physical Exam Vitals signs and nursing note reviewed.  Constitutional:      Appearance: Normal appearance. She is normal weight.  HENT:     Head: Normocephalic.     Right Ear: External ear normal.     Left Ear: External ear normal.     Mouth/Throat:     Mouth: Mucous membranes are moist.  Eyes:     Extraocular Movements: Extraocular movements intact.  Neck:     Musculoskeletal: Normal range of motion.  Cardiovascular:     Rate and Rhythm: Normal rate.     Pulses: Normal pulses.  Pulmonary:     Effort: Pulmonary effort is normal.  Abdominal:     General: Abdomen is flat.  Musculoskeletal:        General: No swelling, tenderness or deformity.     Lumbar back: She exhibits pain. She exhibits normal range of motion, no tenderness, no bony tenderness, no swelling, no edema, no deformity, no laceration, no spasm and normal pulse.     Right lower leg: No edema.  Skin:    General: Skin is warm and dry.  Neurological:     General: No focal deficit present.     Mental Status: She is alert and oriented to person, place, and time. Mental status is at baseline.     Cranial Nerves: No cranial nerve deficit.     Sensory: No sensory deficit.     Motor:  No weakness.     Coordination: Coordination normal.     Gait: Gait normal.     Deep Tendon Reflexes: Reflexes normal.  Psychiatric:        Mood and Affect: Mood normal.        Behavior: Behavior normal.        Thought Content: Thought content normal.        Judgment: Judgment normal.           Assessment & Plan:   Groin pain, right  Need for influenza vaccination - Plan: Flu Vaccine QUAD High Dose(Fluad)  Rib pain No follow-ups on file.

## 2019-09-17 ENCOUNTER — Other Ambulatory Visit: Payer: Self-pay | Admitting: Family Medicine

## 2019-09-17 DIAGNOSIS — M5136 Other intervertebral disc degeneration, lumbar region: Secondary | ICD-10-CM

## 2019-09-17 DIAGNOSIS — M51369 Other intervertebral disc degeneration, lumbar region without mention of lumbar back pain or lower extremity pain: Secondary | ICD-10-CM

## 2019-09-21 DIAGNOSIS — G4733 Obstructive sleep apnea (adult) (pediatric): Secondary | ICD-10-CM | POA: Diagnosis not present

## 2019-09-22 ENCOUNTER — Ambulatory Visit (INDEPENDENT_AMBULATORY_CARE_PROVIDER_SITE_OTHER)
Admission: RE | Admit: 2019-09-22 | Discharge: 2019-09-22 | Disposition: A | Discharge status: Home / Self Care | Payer: Medicare Other | Source: Ambulatory Visit | Attending: Pulmonary Disease | Admitting: Pulmonary Disease

## 2019-09-22 ENCOUNTER — Other Ambulatory Visit: Payer: Self-pay

## 2019-09-22 DIAGNOSIS — R059 Cough, unspecified: Secondary | ICD-10-CM

## 2019-09-22 DIAGNOSIS — R05 Cough: Secondary | ICD-10-CM

## 2019-09-22 DIAGNOSIS — R918 Other nonspecific abnormal finding of lung field: Secondary | ICD-10-CM | POA: Diagnosis not present

## 2019-09-28 DIAGNOSIS — M5136 Other intervertebral disc degeneration, lumbar region: Secondary | ICD-10-CM | POA: Insufficient documentation

## 2019-09-28 DIAGNOSIS — I700 Atherosclerosis of aorta: Secondary | ICD-10-CM | POA: Insufficient documentation

## 2019-09-28 NOTE — Assessment & Plan Note (Addendum)
She is on oscal.  Remains active. Due for DEXA next year.

## 2019-09-28 NOTE — Assessment & Plan Note (Signed)
On calcium and vitamin D. Repeat both calcium and vitamin D levels today.

## 2019-09-28 NOTE — Progress Notes (Signed)
Subjective:   Patient ID: Gloria Werner, female    DOB: 20-Jul-1941, 78 y.o.   MRN: [id number]  Gloria Werner is a pleasant 78 y.o. year old female who presents to clinic today with Follow-up (Patient is here today for a F/U.  She is UTD with immunizations. She is currently fasting.  She has not taken her BP med this am.)  on 09/29/2019  HPI:  Saw Gloria Plummer, RN for medicare wellness visit on 09/10/19- note reviewed.  Health Maintenance  Topic Date Due  . MAMMOGRAM  09/07/2020  . Fecal DNA (Cologuard)  10/02/2021  . TETANUS/TDAP  10/22/2022  . INFLUENZA VACCINE  Completed  . DEXA SCAN  Completed  . PNA vac Low Risk Adult  Completed   Mammogram 09/08/19.  Negative cologuard on 10/02/18. Dexa scan-  04/15/18- osteopenia takes oscal, along with Vit D.  Vit D deficiency- Lab Results  Component Value Date   VD25OH 31.15 08/13/2017   VD25OH 45 01/17/2017   VD25OH 39.84 06/21/2016    Followed by GYN- Dr. Hale Werner. Saw her on 09/12/18 for post menopausal bleeding.  Note reviewed. She found vaginal ulcerations likely caused by significant prolapse.  Vulvular biopsy also neg. CT abd/pelvis  Neg. Scheduled for XI ROBOTIC ASSISTED LAPAROSCOPIC SACROCOLPOPEXY by Gloria Werner on 10/16/18.  Depression- currently on Wellbutrin 75 mg twice daily.  Depression screen [facility] 2/9 09/10/2019 08/22/2018 08/14/2017 06/21/2016 04/08/2015  Decreased Interest 0 0 0 1 1  Down, Depressed, Hopeless 0 0 0 0 1  PHQ - 2 Score 0 0 0 1 2  Altered sleeping - - 0 - 0  Tired, decreased energy - - 0 - 0  Change in appetite - - 0 - 1  Feeling bad or failure about yourself  - - 0 - 0  Trouble concentrating - - 0 - 1  Moving slowly or fidgety/restless - - 0 - 0  Suicidal thoughts - - 0 - 0  PHQ-9 Score - - 0 - 4  Difficult doing work/chores - - Not difficult at all - Not difficult at all     HTN- Restarted lisinopril on 08/22/18 since her daily cough did not resolve once she stopped taking ACEI. On  09/04/18, we increased her lisinopril to 20 mg daily since blood pressure was still at goal.  She is currently only taking Norvasc 5 mg daily. Denies HA, blurred vision, CP, LE, or SOB.  She has not taken her BP medication this morning.  BP Readings from Last 3 Encounters:  09/29/19 (!) 144/88  09/16/19 120/72  08/27/19 126/72   Cough- unfortunately it persists Saw pulmonary, Dr. Vaughan Browner, on 08/27/19 for persistent cough.  Note reviewed.  Assessment and plan was as follows:  Assessment:  Chronic cough Likely has upper airway cough from postnasal drip with contribution from GERD  Continue the Flonase, Astelin nasal spray We will add Atrovent. Instructed her to use chlorpheniramine 2 tablets 3 times daily for maximal benefit She does not want to retry antiacid medication.  PFTs reviewed with no significant obstruction.  There is no clear evidence of asthma.  However there is improvement in mid flow rates suggestive of small airway disease.  She did not respond to Symbicort inhaler She has persistent symptoms we will schedule a high-resolution CT to rule out interstitial lung disease  I educated her on behavioral changes to deal with cough including conscious suppression of the urge to cough, use of throat lozenges.  OSA She has appears to be  on AutoSet CPAP but it appears that her pressure is set at constant pressure of 15 We will change this to AutoSet 5-15  Lung nodule Stable from 2016 to 2019.  No further follow-up needed.  Plan/Recommendations: - Chlorpheniramine, Astelin, steroid nasal spray - Atrovent nasal spray - CT chest - AutoSet CPAP  Follow-up in 1 to 2 months.  Marshell Garfinkel MD [facility] 08/27/2019, 11:58 AM  CT of chest performed on 09/22/19-  IMPRESSION: 1. No definitive evidence of interstitial lung disease. 2. Mild air trapping is indicative of small airways disease. 3. Aortic atherosclerosis (ICD10-170.0). Coronary artery  Calcification.   She has follow up appointment scheduled with Dr. Vaughan Browner on Wednesday of this week.   Murmur- saw Dr. Johnsie Cancel on 07/09/19- for new aV sclerosis murmur.  Note reviewed. 2D echo done on 07/21/19 which showed normal EF, overall normal echo.  Aortic atherosclerosis- not on statin  The 10-year ASCVD risk score Gloria Bussing DC Jr., et al., 2013) is: 25.7%   Values used to calculate the score:     Age: 51 years     Sex: Female     Is Non-Hispanic African American: No     Diabetic: No     Tobacco smoker: No     Systolic Blood Pressure: 123456 mmHg     Is BP treated: Yes     HDL Cholesterol: 52.4 mg/dL     Total Cholesterol: 181 mg/dL   Lab Results  Component Value Date   CHOL 181 09/04/2018   HDL 52.40 09/04/2018   LDLCALC 115 (H) 09/04/2018   LDLDIRECT 140.5 02/18/2014   TRIG 69.0 09/04/2018   CHOLHDL 3 09/04/2018    Lab Results  Component Value Date   CREATININE 0.89 10/17/2018   Lab Results  Component Value Date   CHOL 181 09/04/2018   HDL 52.40 09/04/2018   LDLCALC 115 (H) 09/04/2018   LDLDIRECT 140.5 02/18/2014   TRIG 69.0 09/04/2018   CHOLHDL 3 09/04/2018    Lab Results  Component Value Date   NA 138 10/17/2018   K 3.9 10/17/2018   CL 106 10/17/2018   CO2 25 10/17/2018   Lab Results  Component Value Date   ALT 18 10/14/2018   AST 28 10/14/2018   ALKPHOS 46 10/14/2018   BILITOT 0.4 10/14/2018     Dg Thoracic Spine W/swimmers  Result Date: 09/17/2019 CLINICAL DATA:  Upper back pain.  No injury. EXAM: THORACIC SPINE - 3 VIEWS COMPARISON:  CT 09/13/2018. FINDINGS: Mild thoracic spine S shaped scoliosis. Diffuse severe multilevel degenerative change. Minimal compression of L2, age undetermined, cannot be excluded paraspinal soft tissues are normal. IMPRESSION: 1. Mild thoracic multilevel degenerative change spine S shaped scoliosis. 2.  Diffuse severe. 3.  Minimal compression of L2, age undetermined, cannot be excluded. Electronically Signed   By: Gloria Werner   Register   On: 09/17/2019 05:53   Dg Lumbar Spine Complete  Result Date: 09/17/2019 CLINICAL DATA:  Groin pain.  Lumbar pain. EXAM: LUMBAR SPINE - COMPLETE 4+ VIEW COMPARISON:  CT 09/13/2018. FINDINGS: Right upper quadrant calcification noted. Gallstone cannot be excluded. Lumbar spine numbered the lowest segmented appearing lumbar shaped vertebrae on lateral view as L5. Diffuse severe multilevel degenerative change. 4 mm retrolisthesis L2 on L3 noted. This most likely degenerative. Minimal compression of L2, age undetermined, cannot be excluded. Aortoiliac atherosclerotic vascular calcification. IMPRESSION: 1.  Gallstone cannot be excluded. 2. Diffuse severe multilevel degenerative change lumbar spine with 4 mm retrolisthesis L2 on L3. 3.  Minimal compression of L2, age undetermined, cannot be excluded. 4.  Aortoiliac atherosclerotic vascular disease. Electronically Signed   By: Gloria Werner  Register   On: 09/17/2019 05:51   Ct Chest High Resolution  Result Date: 09/22/2019 CLINICAL DATA:  Persistent cough. EXAM: CT CHEST WITHOUT CONTRAST TECHNIQUE: Multidetector CT imaging of the chest was performed following the standard protocol without intravenous contrast. High resolution imaging of the lungs, as well as inspiratory and expiratory imaging, was performed. COMPARISON:  CT abdomen pelvis 09/13/2018. FINDINGS: Cardiovascular: Atherosclerotic calcification of the aorta, aortic valve and coronary arteries. Heart size normal. No pericardial effusion. Mediastinum/Nodes: No pathologically enlarged mediastinal or axillary lymph nodes. Hilar regions are difficult to evaluate without IV contrast. Esophagus is grossly unremarkable. Lungs/Pleura: Bilateral lower lobe pulmonary nodules measure up to 5 mm on the right, unchanged and considered benign. Very minimal subpleural reticular densities in the right upper lobe, nonspecific. Otherwise, no definitive subpleural reticulation, traction bronchiectasis/bronchiolectasis,  ground-glass, architectural distortion or honeycombing. Mild air trapping. No pleural fluid. Airway is unremarkable. Upper Abdomen: Visualized portions of the liver, gallbladder, adrenal glands, left kidney, spleen, pancreas, stomach and bowel are grossly unremarkable. Musculoskeletal: Degenerative changes in the spine. No worrisome lytic or sclerotic lesions. IMPRESSION: 1. No definitive evidence of interstitial lung disease. 2. Mild air trapping is indicative of small airways disease. 3. Aortic atherosclerosis (ICD10-170.0). Coronary artery calcification. Electronically Signed   By: Lorin Picket M.D.   On: 09/22/2019 09:52   Mm 3d Screen Breast Bilateral  Result Date: 09/08/2019 CLINICAL DATA:  Screening. EXAM: DIGITAL SCREENING BILATERAL MAMMOGRAM WITH TOMO AND CAD COMPARISON:  Previous exam(s). ACR Breast Density Category b: There are scattered areas of fibroglandular density. FINDINGS: There are no findings suspicious for malignancy. Images were processed with CAD. IMPRESSION: No mammographic evidence of malignancy. A result letter of this screening mammogram will be mailed directly to the patient. RECOMMENDATION: Screening mammogram in one year. (Code:SM-B-01Y) BI-RADS CATEGORY  1: Negative. Electronically Signed   By: Franki Cabot M.D.   On: 09/08/2019 11:48    Current Outpatient Medications on File Prior to Visit  Medication Sig Dispense Refill  . calcium carbonate (OSCAL) 1500 (600 Ca) MG TABS tablet Take by mouth daily with breakfast.    . Carboxymethylcellul-Glycerin (LUBRICATING EYE DROPS OP) Place 1 drop into both eyes daily as needed (dry eyes).    . chlorpheniramine (CHLOR-TRIMETON) 2 MG/5ML syrup Take 2 mg by mouth every 4 (four) hours as needed for allergies.    Marland Kitchen ergocalciferol (VITAMIN D2) 1.25 MG (50000 UT) capsule Take 50,000 Units by mouth every 30 (thirty) days.     . Multiple Vitamin (MULTIVITAMIN) tablet Take 1 tablet by mouth daily.    Marland Kitchen saccharomyces boulardii (FLORASTOR)  250 MG capsule Take 2 capsules (500 mg total) by mouth 2 (two) times daily. 120 capsule 3  . triamcinolone (NASACORT) 55 MCG/ACT AERO nasal inhaler Place 2 sprays into the nose daily.     No current facility-administered medications on file prior to visit.     Allergies  Allergen Reactions  . Ace Inhibitors Cough  . Codeine Nausea And Vomiting  . Lexapro [Escitalopram] Diarrhea and Other (See Comments)    headache    Past Medical History:  Diagnosis Date  . Allergic rhinitis due to pollen   . Complication of anesthesia   . Cough   . Gastroesophageal reflux disease without esophagitis   . Irritable bowel syndrome   . 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 PONV (postoperative nausea and vomiting)  Past Surgical History:  Procedure Laterality Date  . ABDOMINAL HYSTERECTOMY  1988   secondary to prolapse  . BLADDER SUSPENSION     A-P with Hyst  . INSERTION OF MESH  10/16/2018   Procedure: INSERTION OF MESH;  Surgeon: Ardis Hughs, MD;  Location: [facility];  Service: Urology;;  . NASAL SINUS SURGERY  1970  . ROBOTIC ASSISTED LAPAROSCOPIC SACROCOLPOPEXY N/A 10/16/2018   Procedure: XI ROBOTIC ASSISTED LAPAROSCOPIC SACROCOLPOPEXY;  Surgeon: Ardis Hughs, MD;  Location: [facility];  Service: Urology;  Laterality: N/A;  . TONSILLECTOMY      Family History  Problem Relation Age of Onset  . Emphysema Mother   . Lymphoma Mother   . Asthma Mother   . Cancer Father 23       lung cancer  . Coronary artery disease Brother   . Cancer Brother        breast cancer  . Breast cancer Brother   . Cancer Brother        lung cancer  . Diabetes Neg Hx   . Hypertension Neg Hx   . Colon cancer Neg Hx     Social History   Socioeconomic History  . Marital status: Married    Spouse name: Not on file  . Number of children: 2  . Years of education: 56  . Highest education level: Not on file  Occupational History  . Occupation: Environmental health practitioner  Social Needs  . Financial resource  strain: Not on file  . Food insecurity    Worry: Not on file    Inability: Not on file  . Transportation needs    Medical: Not on file    Non-medical: Not on file  Tobacco Use  . Smoking status: Never Smoker  . Smokeless tobacco: Never Used  Substance and Sexual Activity  . Alcohol use: No  . Drug use: No  . Sexual activity: Yes    Partners: Male    Birth control/protection: Surgical  Lifestyle  . Physical activity    Days per week: Not on file    Minutes per session: Not on file  . Stress: Not on file  Relationships  . Social Herbalist on phone: Not on file    Gets together: Not on file    Attends religious service: Not on file    Active member of club or organization: Not on file    Attends meetings of clubs or organizations: Not on file    Relationship status: Not on file  . Intimate partner violence    Fear of current or ex partner: Not on file    Emotionally abused: Not on file    Physically abused: Not on file    Forced sexual activity: Not on file  Other Topics Concern  . Not on file  Social History Narrative   HSG. Married '63. 1 son- 3'; 1-daughter-'68; grandchildren 3. 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 Marriage in good health. 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 Provided lead to [email or web address].   The PMH, PSH, Social History, Family History, Medications, and allergies have been reviewed in [facility], and have been updated if relevant.    Review of Systems  Constitutional: Negative.   HENT: Negative.   Respiratory: Positive for cough. Negative for choking, shortness of breath, wheezing and stridor.   Cardiovascular: Negative.   Gastrointestinal: Negative.   Endocrine: Negative.   Genitourinary: Negative.  Musculoskeletal: Negative.   Allergic/Immunologic: Negative.   Neurological: Negative.   Hematological: Negative.   Psychiatric/Behavioral: Negative.   All  other systems reviewed and are negative.      Objective:    BP (!) 144/88 (BP Location: Left Arm, Cuff Size: Normal)   Pulse 70   Temp (!) 97.5 F (36.4 C) (Oral)   Wt 152 lb 6.4 oz (69.1 kg)   LMP 12/25/1986 (Approximate)   SpO2 100%   BMI 29.76 kg/m    Physical Exam Vitals signs and nursing note reviewed.  Constitutional:      General: She is not in acute distress.    Appearance: Normal appearance. She is normal weight. She is not ill-appearing, toxic-appearing or diaphoretic.  HENT:     Head: Normocephalic and atraumatic.     Right Ear: Tympanic membrane normal.     Left Ear: Tympanic membrane normal.     Nose: Nose normal.     Mouth/Throat:     Mouth: Mucous membranes are moist.  Eyes:     Extraocular Movements: Extraocular movements intact.  Neck:     Musculoskeletal: Normal range of motion and neck supple.  Cardiovascular:     Rate and Rhythm: Normal rate and regular rhythm.     Pulses: Normal pulses.     Heart sounds: Murmur present.  Pulmonary:     Effort: Pulmonary effort is normal.     Breath sounds: Normal breath sounds.  Abdominal:     General: Abdomen is flat. Bowel sounds are normal.     Palpations: Abdomen is soft.  Musculoskeletal: Normal range of motion.  Skin:    General: Skin is warm and dry.  Neurological:     General: No focal deficit present.     Mental Status: She is alert and oriented to person, place, and time. Mental status is at baseline.     Cranial Nerves: No cranial nerve deficit.  Psychiatric:        Mood and Affect: Mood normal.        Behavior: Behavior normal.        Thought Content: Thought content normal.        Judgment: Judgment normal.             Assessment & Plan:   Osteopenia of lumbar spine  DDD (degenerative disc disease), lumbar  Aortic atherosclerosis (HCC)  Allergic rhinitis, unspecified seasonality, unspecified trigger  Depression, unspecified depression type  OSA (obstructive sleep apnea)   Essential hypertension - Plan: CBC with Differential/Platelet, Comprehensive metabolic panel, Lipid panel, TSH  Cough  Vitamin D deficiency - Plan: Vitamin D (25 hydroxy)  Gastroesophageal reflux disease, unspecified whether esophagitis present  Seasonal affective disorder (HCC)  Pulmonary nodule No follow-ups on file.

## 2019-09-28 NOTE — Assessment & Plan Note (Signed)
Mild, on autopap.  Followed by pulmonary.

## 2019-09-28 NOTE — Assessment & Plan Note (Addendum)
Well controlled on low dose Norvasc. No changes made today- mildly elevated but has not taking her medication this morning.

## 2019-09-28 NOTE — Assessment & Plan Note (Addendum)
With high 10 year ASCVD risk score- The 10-year ASCVD risk score Mikey Bussing DC Jr., et al., 2013) is: 18.6%   Values used to calculate the score:     Age: 78 years     Sex: Female     Is Non-Hispanic African American: No     Diabetic: No     Tobacco smoker: No     Systolic Blood Pressure: 123456 mmHg     Is BP treated: Yes     HDL Cholesterol: 52.4 mg/dL     Total Cholesterol: 181 mg/dL  After counseling, she agrees to start crestor 5 mg nightly and to follow up labs in 2 months.

## 2019-09-28 NOTE — Assessment & Plan Note (Signed)
Currently taking Flonase, astelin and atrovent (added by pulmonary).

## 2019-09-28 NOTE — Patient Instructions (Addendum)
Great to see you. I will call you with your lab results from today and you can view them online.   We are starting Crestor 5 mg nightly.  Please return for follow up labs as scheduled but contact me if you have any issues before please.

## 2019-09-28 NOTE — Assessment & Plan Note (Addendum)
With seasonal affective disorder. Well controlled. No changes made.   Clinical Support from 09/10/2019 in [facility]  PHQ-2 Total Score  0

## 2019-09-29 ENCOUNTER — Ambulatory Visit (INDEPENDENT_AMBULATORY_CARE_PROVIDER_SITE_OTHER): Payer: Medicare Other | Admitting: Family Medicine

## 2019-09-29 ENCOUNTER — Encounter: Payer: Self-pay | Admitting: Family Medicine

## 2019-09-29 ENCOUNTER — Other Ambulatory Visit: Payer: Self-pay

## 2019-09-29 VITALS — BP 144/88 | HR 70 | Temp 97.5°F | Wt 152.4 lb

## 2019-09-29 DIAGNOSIS — J309 Allergic rhinitis, unspecified: Secondary | ICD-10-CM | POA: Diagnosis not present

## 2019-09-29 DIAGNOSIS — R911 Solitary pulmonary nodule: Secondary | ICD-10-CM

## 2019-09-29 DIAGNOSIS — I10 Essential (primary) hypertension: Secondary | ICD-10-CM | POA: Diagnosis not present

## 2019-09-29 DIAGNOSIS — F32A Depression, unspecified: Secondary | ICD-10-CM

## 2019-09-29 DIAGNOSIS — E559 Vitamin D deficiency, unspecified: Secondary | ICD-10-CM

## 2019-09-29 DIAGNOSIS — M5136 Other intervertebral disc degeneration, lumbar region: Secondary | ICD-10-CM

## 2019-09-29 DIAGNOSIS — I700 Atherosclerosis of aorta: Secondary | ICD-10-CM | POA: Diagnosis not present

## 2019-09-29 DIAGNOSIS — R05 Cough: Secondary | ICD-10-CM

## 2019-09-29 DIAGNOSIS — F329 Major depressive disorder, single episode, unspecified: Secondary | ICD-10-CM

## 2019-09-29 DIAGNOSIS — M8588 Other specified disorders of bone density and structure, other site: Secondary | ICD-10-CM | POA: Diagnosis not present

## 2019-09-29 DIAGNOSIS — F338 Other recurrent depressive disorders: Secondary | ICD-10-CM

## 2019-09-29 DIAGNOSIS — K219 Gastro-esophageal reflux disease without esophagitis: Secondary | ICD-10-CM

## 2019-09-29 DIAGNOSIS — R059 Cough, unspecified: Secondary | ICD-10-CM

## 2019-09-29 DIAGNOSIS — G4733 Obstructive sleep apnea (adult) (pediatric): Secondary | ICD-10-CM

## 2019-09-29 LAB — CBC WITH DIFFERENTIAL/PLATELET
Basophils Absolute: 0 10*3/uL (ref 0.0–0.1)
Basophils Relative: 0.9 % (ref 0.0–3.0)
Eosinophils Absolute: 0.1 10*3/uL (ref 0.0–0.7)
Eosinophils Relative: 3.4 % (ref 0.0–5.0)
HCT: 41.3 % (ref 36.0–46.0)
Hemoglobin: 13.7 g/dL (ref 12.0–15.0)
Lymphocytes Relative: 28 % (ref 12.0–46.0)
Lymphs Abs: 1.2 10*3/uL (ref 0.7–4.0)
MCHC: 33.2 g/dL (ref 30.0–36.0)
MCV: 90 fl (ref 78.0–100.0)
Monocytes Absolute: 0.4 10*3/uL (ref 0.1–1.0)
Monocytes Relative: 9.3 % (ref 3.0–12.0)
Neutro Abs: 2.5 10*3/uL (ref 1.4–7.7)
Neutrophils Relative %: 58.4 % (ref 43.0–77.0)
Platelets: 197 10*3/uL (ref 150.0–400.0)
RBC: 4.59 Mil/uL (ref 3.87–5.11)
RDW: 13.9 % (ref 11.5–15.5)
WBC: 4.2 10*3/uL (ref 4.0–10.5)

## 2019-09-29 LAB — TSH: TSH: 1.64 u[IU]/mL (ref 0.35–4.50)

## 2019-09-29 LAB — COMPREHENSIVE METABOLIC PANEL
ALT: 16 U/L (ref 0–35)
AST: 22 U/L (ref 0–37)
Albumin: 4.4 g/dL (ref 3.5–5.2)
Alkaline Phosphatase: 59 U/L (ref 39–117)
BUN: 20 mg/dL (ref 6–23)
CO2: 29 mEq/L (ref 19–32)
Calcium: 9.6 mg/dL (ref 8.4–10.5)
Chloride: 105 mEq/L (ref 96–112)
Creatinine, Ser: 0.82 mg/dL (ref 0.40–1.20)
GFR: 67.81 mL/min (ref >60.00)
Glucose, Bld: 95 mg/dL (ref 70–99)
Potassium: 4.1 mEq/L (ref 3.5–5.1)
Sodium: 141 mEq/L (ref 135–145)
Total Bilirubin: 0.4 mg/dL (ref 0.2–1.2)
Total Protein: 6.8 g/dL (ref 6.0–8.3)

## 2019-09-29 LAB — LIPID PANEL
Cholesterol: 195 mg/dL (ref 0–200)
HDL: 51.5 mg/dL (ref >39.00)
LDL Cholesterol: 125 mg/dL — ABNORMAL HIGH (ref 0–99)
NonHDL: 143.15
Total CHOL/HDL Ratio: 4
Triglycerides: 91 mg/dL (ref 0.0–149.0)
VLDL: 18.2 mg/dL (ref 0.0–40.0)

## 2019-09-29 LAB — VITAMIN D 25 HYDROXY (VIT D DEFICIENCY, FRACTURES): VITD: 35.31 ng/mL (ref 30.00–100.00)

## 2019-09-29 LAB — BRAIN NATRIURETIC PEPTIDE: Pro B Natriuretic peptide (BNP): 31 pg/mL (ref 0.0–100.0)

## 2019-09-29 MED ORDER — ROSUVASTATIN CALCIUM 5 MG PO TABS: 5.0000 mg | ORAL_TABLET | Freq: Every day | ORAL | 3 refills | Status: DC

## 2019-09-29 MED ORDER — IPRATROPIUM BROMIDE 0.03 % NA SOLN: 2.0000 | Freq: Two times a day (BID) | NASAL | 11 refills | Status: DC

## 2019-09-29 MED ORDER — AZELASTINE HCL 0.1 % NA SOLN: 1.0000 | Freq: Two times a day (BID) | NASAL | 11 refills | Status: DC | PRN

## 2019-09-29 MED ORDER — BUPROPION HCL 75 MG PO TABS: 75.0000 mg | ORAL_TABLET | Freq: Two times a day (BID) | ORAL | 3 refills | Status: DC

## 2019-09-29 MED ORDER — AMLODIPINE BESYLATE 5 MG PO TABS: 5.0000 mg | ORAL_TABLET | Freq: Every day | ORAL | 3 refills | Status: DC

## 2019-09-29 MED ORDER — ALBUTEROL SULFATE HFA 108 (90 BASE) MCG/ACT IN AERS: 2.0000 | INHALATION_SPRAY | Freq: Four times a day (QID) | RESPIRATORY_TRACT | 11 refills | Status: DC | PRN

## 2019-09-29 NOTE — Assessment & Plan Note (Signed)
Followed by pulmonary for this, OSA, and chronic cough.

## 2019-09-29 NOTE — Assessment & Plan Note (Addendum)
Has followed up scheduled in two days with pulmonary. No changes made to rxs. Cough suppressant was added by pulmonary.

## 2019-09-29 NOTE — Assessment & Plan Note (Signed)
Well controlled. No changes made today. 

## 2019-10-01 ENCOUNTER — Ambulatory Visit: Payer: Medicare Other | Admitting: Pulmonary Disease

## 2019-10-08 ENCOUNTER — Encounter: Payer: Self-pay | Admitting: Pulmonary Disease

## 2019-10-08 ENCOUNTER — Other Ambulatory Visit: Payer: Self-pay

## 2019-10-08 ENCOUNTER — Ambulatory Visit: Payer: Medicare Other | Admitting: Pulmonary Disease

## 2019-10-08 VITALS — BP 118/78 | HR 64 | Temp 98.0°F | Ht 60.0 in | Wt 154.2 lb

## 2019-10-08 DIAGNOSIS — G4733 Obstructive sleep apnea (adult) (pediatric): Secondary | ICD-10-CM

## 2019-10-08 DIAGNOSIS — R05 Cough: Secondary | ICD-10-CM

## 2019-10-08 DIAGNOSIS — R059 Cough, unspecified: Secondary | ICD-10-CM

## 2019-10-08 NOTE — Patient Instructions (Signed)
I have reviewed the CT which does not show any evidence of interstitial lung disease Continue the nose sprays and antihistamines as directed. I have a follow-up with Dr. Melvyn Novas to see if there is anything we missed regarding the cough. Continue the CPAP every night

## 2019-10-08 NOTE — Progress Notes (Signed)
Gloria Werner    [id number]    November 27, 1941  Primary Care Physician:Aron, Marciano Sequin, MD  Referring Physician: Lucille Passy, MD [place],  [place] [address]  Chief complaint: Follow up for cough  HPI: 78 year old with history of hypertension.  Complains of chronic cough for the past 5 months.  She was started on lisinopril 06/03/2018 and notes she started developing cough within 2 to 4 days after this. Tried treatment for postnasal drip with Claritin, Zyrtec, Xyzal, Nasacort for acid reflux with H2 blocker and PPI Taken off lisinopril in 2019 and started on Norvasc by primary care Tried on trial of Symbicort in 2019 without any improvement in symptoms.  Seen by Dr. 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  Treated with omeprazole and famotidine at that time.  Also given Dymista and montelukast Seen by ENT, Dr. Wilburn Cornelia in June 2020 who noted post glottic erythema consistent with gastroesophageal reflux  Patient does not believe she has significant GERD.  She had tried antiacid medication for 2 months without any benefit and does not want to go back on it.  Pets: Has a dog, no cats, birds, farm animals. 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 Exposures: No mold, hot tub, Jacuzzi, humidifier Smoking history: Never smoker Travel history: No significant travel Relevant family history: Mother had emphysema, asthma.  Interim history: Continues to have cough.  Although this is somewhat to last visit.  Denies any dyspnea, wheezing She is using the CPAP every day but states that her cough limits the time she spends with the CPAP.  He had a recent CT which did not show any interstitial lung disease.  There is coronary artery calcification, atherosclerosis and she has been started on Crestor by primary care.  Outpatient Encounter Medications as of 10/08/2019  Medication Sig  . albuterol (VENTOLIN HFA) 108 (90 Base) MCG/ACT inhaler  Inhale 2 puffs into the lungs every 6 (six) hours as needed for shortness of breath (cough).  Marland Kitchen amLODipine (NORVASC) 5 MG tablet Take 1 tablet (5 mg total) by mouth daily.  Marland Kitchen azelastine (ASTELIN) 0.1 % nasal spray Place 1 spray into both nostrils 2 (two) times daily as needed for rhinitis or allergies. Use in each nostril as directed  . buPROPion (WELLBUTRIN) 75 MG tablet Take 1 tablet (75 mg total) by mouth 2 (two) times daily.  . calcium carbonate (OSCAL) 1500 (600 Ca) MG TABS tablet Take by mouth daily with breakfast.  . Carboxymethylcellul-Glycerin (LUBRICATING EYE DROPS OP) Place 1 drop into both eyes daily as needed (dry eyes).  . chlorpheniramine (CHLOR-TRIMETON) 2 MG/5ML syrup Take 2 mg by mouth every 4 (four) hours as needed for allergies.  Marland Kitchen ergocalciferol (VITAMIN D2) 1.25 MG (50000 UT) capsule Take 50,000 Units by mouth every 30 (thirty) days.   Marland Kitchen ipratropium (ATROVENT) 0.03 % nasal spray Place 2 sprays into both nostrils every 12 (twelve) hours.  . Multiple Vitamin (MULTIVITAMIN) tablet Take 1 tablet by mouth daily.  . rosuvastatin (CRESTOR) 5 MG tablet Take 1 tablet (5 mg total) by mouth at bedtime.  . saccharomyces boulardii (FLORASTOR) 250 MG capsule Take 2 capsules (500 mg total) by mouth 2 (two) times daily.  Marland Kitchen triamcinolone (NASACORT) 55 MCG/ACT AERO nasal inhaler Place 2 sprays into the nose daily.   No facility-administered encounter medications on file as of 10/08/2019.    Physical Exam: Blood pressure 118/78, pulse 64, temperature 98 F (36.7 C),  temperature source Temporal, height 5' (1.524 m), weight 154 lb 3.2 oz (69.9 kg), last menstrual period 12/25/1986, SpO2 97 %. Gen:      No acute distress HEENT:  EOMI, sclera anicteric Neck:     No masses; no thyromegaly Lungs:    Clear to auscultation bilaterally; normal respiratory effort CV:         Regular rate and rhythm; no murmurs Abd:      + bowel sounds; soft, non-tender; no palpable masses, no distension Ext:    No  edema; adequate peripheral perfusion Skin:      Warm and dry; no rash Neuro: alert and oriented x 3 Psych: normal mood and affect  Data Reviewed: Imaging: CT abdomen pelvis 02/02/2010-4 mm right subpleural lung nodule.  No acute pulmonary findings CT abdomen pelvis 10/21/2015-stable right pleural lung nodule, no acute lung findings Chest x-ray 08/22/2018- no acute cardiopulmonary abnormality CT abdomen pelvis 09/13/2018- 4-5 mm right lung nodule.  No acute findings. CT high-resolution 09/22/2019- no interstitial lung disease, mild air trapping, coronary artery calcification.  I have reviewed the images personally  PFTs: 11/27/2018-FVC 3.02 (128%), FEV1 2.56 [145%), F/F 85, TLC 121%, DLCO 112% Normal test  01/21/2019 FVC 2.97 [128%], FEV1 2.49 [144%], F/F 84  FENO 10/01/2018-13  Labs: CBC 09/04/2018-WBC 4.7, eos 4.2%, absolute eosinophil count 197 CBC 10/01/2018-WBC 5.6, eos 4.6%, absolute eosinophil count 58 IgE 10/01/2018-53  Sleep PSG 01/11/2019- mild obstructive sleep apnea, AHI 8.3  Assessment:  Chronic cough Likely has upper airway cough from postnasal drip with contribution from GERD  Continue the Flonase, Astelin. atrovent nasal spray Use chlorpheniramine 2 tablets 3 times daily for maximal benefit She does not want to retry antiacid medication or any codeine-containing cough medication.  PFTs reviewed with no significant obstruction.  There is no clear evidence of asthma.  However there is improvement in mid flow rates suggestive of small airway disease.  She did not respond to Symbicort inhaler.  No evidence of interstitial lung disease on high-res CT. I will have her follow-up with my partner Dr. Melvyn Novas for a second opinion as she continues to have symptoms.  I educated her on behavioral changes to deal with cough including conscious suppression of the urge to cough, use of throat lozenges.  OSA Continue on AutoSet CPAP.  Encouraged to use it every day  Lung nodule Stable  from 2016 to 2020.  No further follow-up needed.  Plan/Recommendations: - Chlorpheniramine, Astelin, atrovent steroid nasal spray - AutoSet CPAP - Consult Dr. Melvyn Novas for second opinion regarding cough  Marshell Garfinkel MD [facility] 10/08/2019, 11:40 AM  CC: Lucille Passy, MD

## 2019-10-21 DIAGNOSIS — G4733 Obstructive sleep apnea (adult) (pediatric): Secondary | ICD-10-CM | POA: Diagnosis not present

## 2019-11-03 DIAGNOSIS — M546 Pain in thoracic spine: Secondary | ICD-10-CM | POA: Diagnosis not present

## 2019-11-03 DIAGNOSIS — M9902 Segmental and somatic dysfunction of thoracic region: Secondary | ICD-10-CM | POA: Diagnosis not present

## 2019-11-03 DIAGNOSIS — M9901 Segmental and somatic dysfunction of cervical region: Secondary | ICD-10-CM | POA: Diagnosis not present

## 2019-11-03 DIAGNOSIS — M542 Cervicalgia: Secondary | ICD-10-CM | POA: Diagnosis not present

## 2019-11-21 DIAGNOSIS — G4733 Obstructive sleep apnea (adult) (pediatric): Secondary | ICD-10-CM | POA: Diagnosis not present

## 2019-11-25 ENCOUNTER — Other Ambulatory Visit: Payer: Self-pay | Admitting: Family Medicine

## 2019-11-28 ENCOUNTER — Other Ambulatory Visit: Payer: Medicare Other

## 2019-12-21 DIAGNOSIS — G4733 Obstructive sleep apnea (adult) (pediatric): Secondary | ICD-10-CM | POA: Diagnosis not present

## 2019-12-24 ENCOUNTER — Ambulatory Visit: Payer: Medicare Other | Admitting: Family Medicine

## 2019-12-29 ENCOUNTER — Encounter: Payer: Self-pay | Admitting: Family Medicine

## 2019-12-29 ENCOUNTER — Other Ambulatory Visit: Payer: Self-pay

## 2019-12-29 ENCOUNTER — Other Ambulatory Visit (INDEPENDENT_AMBULATORY_CARE_PROVIDER_SITE_OTHER): Payer: Medicare Other

## 2019-12-29 DIAGNOSIS — I700 Atherosclerosis of aorta: Secondary | ICD-10-CM | POA: Diagnosis not present

## 2019-12-29 LAB — COMPREHENSIVE METABOLIC PANEL
ALT: 21 U/L (ref 0–35)
AST: 27 U/L (ref 0–37)
Albumin: 4.5 g/dL (ref 3.5–5.2)
Alkaline Phosphatase: 53 U/L (ref 39–117)
BUN: 16 mg/dL (ref 6–23)
CO2: 26 mEq/L (ref 19–32)
Calcium: 9.5 mg/dL (ref 8.4–10.5)
Chloride: 104 mEq/L (ref 96–112)
Creatinine, Ser: 0.78 mg/dL (ref 0.40–1.20)
GFR: 71.79 mL/min (ref >60.00)
Glucose, Bld: 105 mg/dL — ABNORMAL HIGH (ref 70–99)
Potassium: 4.1 mEq/L (ref 3.5–5.1)
Sodium: 139 mEq/L (ref 135–145)
Total Bilirubin: 0.4 mg/dL (ref 0.2–1.2)
Total Protein: 7.3 g/dL (ref 6.0–8.3)

## 2019-12-29 LAB — LIPID PANEL
Cholesterol: 143 mg/dL (ref 0–200)
HDL: 56 mg/dL (ref >39.00)
LDL Cholesterol: 72 mg/dL (ref 0–99)
NonHDL: 87.25
Total CHOL/HDL Ratio: 3
Triglycerides: 74 mg/dL (ref 0.0–149.0)
VLDL: 14.8 mg/dL (ref 0.0–40.0)

## 2020-01-21 DIAGNOSIS — G4733 Obstructive sleep apnea (adult) (pediatric): Secondary | ICD-10-CM | POA: Diagnosis not present

## 2020-01-28 ENCOUNTER — Other Ambulatory Visit: Payer: Self-pay

## 2020-01-28 ENCOUNTER — Ambulatory Visit: Payer: Medicare Other | Admitting: Internal Medicine

## 2020-01-28 ENCOUNTER — Encounter: Payer: Self-pay | Admitting: Internal Medicine

## 2020-01-28 VITALS — BP 126/80 | HR 84 | Temp 97.9°F | Ht 60.0 in | Wt 158.0 lb

## 2020-01-28 DIAGNOSIS — R194 Change in bowel habit: Secondary | ICD-10-CM | POA: Diagnosis not present

## 2020-01-28 DIAGNOSIS — Z8719 Personal history of other diseases of the digestive system: Secondary | ICD-10-CM | POA: Diagnosis not present

## 2020-01-28 DIAGNOSIS — R140 Abdominal distension (gaseous): Secondary | ICD-10-CM | POA: Diagnosis not present

## 2020-01-28 DIAGNOSIS — R109 Unspecified abdominal pain: Secondary | ICD-10-CM | POA: Diagnosis not present

## 2020-01-28 DIAGNOSIS — R635 Abnormal weight gain: Secondary | ICD-10-CM

## 2020-01-28 MED ORDER — NA SULFATE-K SULFATE-MG SULF 17.5-3.13-1.6 GM/177ML PO SOLN: 1.0000 | Freq: Once | ORAL | 0 refills | Status: AC

## 2020-01-28 NOTE — Patient Instructions (Addendum)
You have been scheduled for an endoscopy and colonoscopy. Please follow the written instructions given to you at your visit today. Please pick up your prep supplies at the pharmacy within the next 1-3 days. If you use inhalers (even only as needed), please bring them with you on the day of your procedure. Your physician has requested that you go to www.startemmi.com and enter the access code given to you at your visit today. This web site gives a general overview about your procedure. However, you should still follow specific instructions given to you by our office regarding your preparation for the procedure.   You have been scheduled for a CT scan of the abdomen and pelvis at [facility] - 1st floor.  You are scheduled on 02/04/2020 at 3:30pm. You should arrive 15 minutes prior to your appointment time for registration. Please follow the written instructions below on the day of your exam:  WARNING: IF YOU ARE ALLERGIC TO IODINE/X-RAY DYE, PLEASE NOTIFY RADIOLOGY IMMEDIATELY AT [phone]! YOU WILL BE GIVEN A 13 HOUR PREMEDICATION PREP.  1) Do not eat or drink anything after 11:30am (4 hours prior to your test) 2) You have been given 2 bottles of oral contrast to drink. The solution may taste better if refrigerated, but do NOT add ice or any other liquid to this solution. Shake well before drinking.    Drink 1 bottle of contrast @ 1:30pm (2 hours prior to your exam)  Drink 1 bottle of contrast @ 2:30pm (1 hour prior to your exam)  You may take any medications as prescribed with a small amount of water, if necessary. If you take any of the following medications: METFORMIN, GLUCOPHAGE, GLUCOVANCE, AVANDAMET, RIOMET, FORTAMET, [place] MET, JANUMET, GLUMETZA or METAGLIP, you MAY be asked to HOLD this medication 48 hours AFTER the exam.  The purpose of you drinking the oral contrast is to aid in the visualization of your intestinal tract. The contrast solution may cause some diarrhea.  Depending on your individual set of symptoms, you may also receive an intravenous injection of x-ray contrast/dye. Plan on being at [facility] for 30 minutes or longer, depending on the type of exam you are having performed.  This test typically takes 30-45 minutes to complete.  If you have any questions regarding your exam or if you need to reschedule, you may call the CT department at [phone] between the hours of 8:00 am and 5:00 pm, Monday-Friday.  ________________________________________________________________________

## 2020-01-29 ENCOUNTER — Encounter: Payer: Self-pay | Admitting: Internal Medicine

## 2020-01-29 NOTE — Progress Notes (Signed)
HISTORY OF PRESENT ILLNESS:  Gloria Werner is a 79 y.o. female with past medical history as listed below including GERD and irritable bowel syndrome who presents today regarding several month history of abdominal pain and change in bowel habits.  She has not been seen in this office since 2016 when she was treated for diverticulitis.  She does have a history of C. difficile.  She last underwent complete colonoscopy September 2014.  In addition to pandiverticulosis she was noted to have a sessile serrated polyp.  She tells me that in October 2019 she underwent cystocele and rectocele repair.  Over the past year she has gained 10 pounds.  She complains of bloating.  She describes intermittent problems with lower abdominal discomfort as well as upper abdominal discomfort under the rib cage.  This is fairly constant.  No nocturnal issues.  Generally has 1-2 bowel movements per day.  Occasionally will not have a bowel movement.  Because of a history of constipation she takes MiraLAX, oatmeal, and prunes.  She denies bleeding.  Review of outside laboratories from December 29, 2019 finds unremarkable comprehensive metabolic panel with normal liver tests.  CBC October 2020 was normal with hemoglobin 13.7.  Imaging from 2019 includes a CT scan of the abdomen and pelvis.  No acute findings.  Diverticulosis noted.  Stable lung nodule.  REVIEW OF SYSTEMS:  All non-GI ROS negative unless otherwise stated in the HPI except for cough, heart murmur  Past Medical History:  Diagnosis Date  . Allergic rhinitis due to pollen   . Complication of anesthesia   . Cough   . Gastroesophageal reflux disease without esophagitis   . Irritable bowel syndrome   . 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 PONV (postoperative nausea and vomiting)     Past Surgical History:  Procedure Laterality Date  . ABDOMINAL HYSTERECTOMY  1988   secondary to prolapse  . BLADDER SUSPENSION     A-P with Hyst  . INSERTION OF MESH  10/16/2018    Procedure: INSERTION OF MESH;  Surgeon: Ardis Hughs, MD;  Location: [facility];  Service: Urology;;  . NASAL SINUS SURGERY  1970  . ROBOTIC ASSISTED LAPAROSCOPIC SACROCOLPOPEXY N/A 10/16/2018   Procedure: XI ROBOTIC ASSISTED LAPAROSCOPIC SACROCOLPOPEXY;  Surgeon: Ardis Hughs, MD;  Location: [facility];  Service: Urology;  Laterality: N/A;  . TONSILLECTOMY      Social History [name]  reports that she has never smoked. She has never used smokeless tobacco. She reports that she does not drink alcohol or use drugs.  family history includes Asthma in her mother; Breast cancer in her brother; Cancer in her brother and brother; Cancer (age of onset: 29) in her father; Coronary artery disease in her brother; Emphysema in her mother; Lymphoma in her mother.  Allergies  Allergen Reactions  . Ace Inhibitors Cough  . Codeine Nausea And Vomiting  . Lexapro [Escitalopram] Diarrhea and Other (See Comments)    headache       PHYSICAL EXAMINATION: Vital signs: BP 126/80   Pulse 84   Temp 97.9 F (36.6 C)   Ht 5' (1.524 m)   Wt 158 lb (71.7 kg)   LMP 12/25/1986 (Approximate)   BMI 30.86 kg/m   Constitutional: generally well-appearing, no acute distress Psychiatric: alert and oriented x3, cooperative Eyes: extraocular movements intact, anicteric, conjunctiva pink Mouth: oral pharynx moist, no lesions Neck: supple no lymphadenopathy Cardiovascular: heart regular rate and rhythm. Lungs: clear to auscultation bilaterally Abdomen: soft, nontender, nondistended,  no obvious ascites, no peritoneal signs, normal bowel sounds, no organomegaly Rectal: Deferred until colonoscopy Extremities: no clubbing, cyanosis, or lower extremity edema bilaterally Skin: no lesions on visible extremities Neuro: No focal deficits.  Cranial nerves intact  ASSESSMENT:  1.  Persistent problems with both lower and upper abdominal pain 2.  Chronic constipation.  On multiple agents 3.  History  of pandiverticulosis and diverticulitis 4.  History of sessile serrated polyp 2014 5.  Weight gain and bloating 6.  General medical problems   PLAN:  1.  Schedule CT scan of the abdomen pelvis to evaluate persistent abdominal pain. 2.  Schedule colonoscopy to evaluate change in bowel habits and lower abdominal pain.The nature of the procedure, as well as the risks, benefits, and alternatives were carefully and thoroughly reviewed with the patient. Ample time for discussion and questions allowed. The patient understood, was satisfied, and agreed to proceed. 3.  Schedule upper endoscopy to evaluate upper abdominal pain.The nature of the procedure, as well as the risks, benefits, and alternatives were carefully and thoroughly reviewed with the patient. Ample time for discussion and questions allowed. The patient understood, was satisfied, and agreed to proceed. 4.  If the above work-up unrevealing for significant pathology then treatment with regular fiber and antispasmodics. A total of 60 minutes was spent preparing to see the patient, reviewing laboratories, x-rays, endoscopy report; obtaining and reviewing separately obtained history, performing comprehensive physical exam, counseling and educating the patient regarding the above listed diagnoses, medical impressions and plans.  Ordering advanced imaging and endoscopic procedures and reviewing the reasons for doing such.  Finally, documenting clinical information in the health record

## 2020-01-30 ENCOUNTER — Telehealth: Payer: Self-pay | Admitting: Internal Medicine

## 2020-01-30 NOTE — Telephone Encounter (Signed)
Discussed with pt that Dr. Henrene Pastor would want her to go ahead with the CT scan as ordered.

## 2020-02-04 ENCOUNTER — Ambulatory Visit (HOSPITAL_COMMUNITY)
Admission: RE | Admit: 2020-02-04 | Discharge: 2020-02-04 | Disposition: A | Discharge status: Home / Self Care | Payer: Medicare Other | Source: Ambulatory Visit | Attending: Internal Medicine | Admitting: Internal Medicine

## 2020-02-04 ENCOUNTER — Other Ambulatory Visit: Payer: Self-pay

## 2020-02-04 DIAGNOSIS — R194 Change in bowel habit: Secondary | ICD-10-CM

## 2020-02-04 DIAGNOSIS — R109 Unspecified abdominal pain: Secondary | ICD-10-CM | POA: Diagnosis not present

## 2020-02-04 DIAGNOSIS — K5730 Diverticulosis of large intestine without perforation or abscess without bleeding: Secondary | ICD-10-CM | POA: Diagnosis not present

## 2020-02-04 DIAGNOSIS — Z8719 Personal history of other diseases of the digestive system: Secondary | ICD-10-CM

## 2020-02-04 MED ORDER — SODIUM CHLORIDE (PF) 0.9 % IJ SOLN
INTRAMUSCULAR | Status: AC
  Filled 2020-02-04: qty 50

## 2020-02-04 MED ORDER — IOHEXOL 300 MG/ML  SOLN
100.0000 mL | Freq: Once | INTRAMUSCULAR | Status: AC | PRN
  Administered 2020-02-04: 16:00:00 100 mL via INTRAVENOUS

## 2020-02-09 DIAGNOSIS — M546 Pain in thoracic spine: Secondary | ICD-10-CM | POA: Diagnosis not present

## 2020-02-09 DIAGNOSIS — M545 Low back pain: Secondary | ICD-10-CM | POA: Diagnosis not present

## 2020-02-09 DIAGNOSIS — M9902 Segmental and somatic dysfunction of thoracic region: Secondary | ICD-10-CM | POA: Diagnosis not present

## 2020-02-09 DIAGNOSIS — M9903 Segmental and somatic dysfunction of lumbar region: Secondary | ICD-10-CM | POA: Diagnosis not present

## 2020-02-12 ENCOUNTER — Encounter: Payer: Self-pay | Admitting: Internal Medicine

## 2020-02-16 DIAGNOSIS — M9902 Segmental and somatic dysfunction of thoracic region: Secondary | ICD-10-CM | POA: Diagnosis not present

## 2020-02-16 DIAGNOSIS — M9903 Segmental and somatic dysfunction of lumbar region: Secondary | ICD-10-CM | POA: Diagnosis not present

## 2020-02-16 DIAGNOSIS — M545 Low back pain: Secondary | ICD-10-CM | POA: Diagnosis not present

## 2020-02-16 DIAGNOSIS — M546 Pain in thoracic spine: Secondary | ICD-10-CM | POA: Diagnosis not present

## 2020-02-18 ENCOUNTER — Encounter: Payer: Self-pay | Admitting: Internal Medicine

## 2020-02-18 ENCOUNTER — Ambulatory Visit (AMBULATORY_SURGERY_CENTER): Payer: Medicare Other | Admitting: Internal Medicine

## 2020-02-18 ENCOUNTER — Other Ambulatory Visit: Payer: Self-pay

## 2020-02-18 VITALS — BP 126/66 | HR 68 | Temp 97.1°F | Resp 17 | Ht 60.0 in | Wt 158.0 lb

## 2020-02-18 DIAGNOSIS — R109 Unspecified abdominal pain: Secondary | ICD-10-CM | POA: Diagnosis not present

## 2020-02-18 DIAGNOSIS — R140 Abdominal distension (gaseous): Secondary | ICD-10-CM

## 2020-02-18 DIAGNOSIS — R194 Change in bowel habit: Secondary | ICD-10-CM | POA: Diagnosis not present

## 2020-02-18 DIAGNOSIS — K5730 Diverticulosis of large intestine without perforation or abscess without bleeding: Secondary | ICD-10-CM

## 2020-02-18 MED ORDER — DICYCLOMINE HCL 10 MG PO CAPS: ORAL_CAPSULE | ORAL | 3 refills | Status: DC

## 2020-02-18 MED ORDER — SODIUM CHLORIDE 0.9 % IV SOLN: 500.0000 mL | Freq: Once | INTRAVENOUS | Status: DC

## 2020-02-18 NOTE — Progress Notes (Signed)
PT taken to PACU. Monitors in place. VSS. Report given to RN. 

## 2020-02-18 NOTE — Progress Notes (Signed)
Pt's states no medical or surgical changes since previsit or office visit.  Gloria Werner

## 2020-02-18 NOTE — Op Note (Signed)
[place] Patient Name: Gloria Werner Procedure Date: 02/18/2020 1:48 PM MRN: [id number] Endoscopist: Docia Chuck. Henrene Pastor , MD Age: 79 Referring MD:  Date of Birth: 02-18-1941 Gender: Female Account #: [id number] Procedure:                Upper GI endoscopy Indications:              Abdominal pain Medicines:                Monitored Anesthesia Care Procedure:                Pre-Anesthesia Assessment:                           - Prior to the procedure, a History and Physical                            was performed, and patient medications and                            allergies were reviewed. The patient's tolerance of                            previous anesthesia was also reviewed. The risks                            and benefits of the procedure and the sedation                            options and risks were discussed with the patient.                            All questions were answered, and informed consent                            was obtained. Prior Anticoagulants: The patient has                            taken no previous anticoagulant or antiplatelet                            agents. ASA Grade Assessment: II - A patient with                            mild systemic disease. After reviewing the risks                            and benefits, the patient was deemed in                            satisfactory condition to undergo the procedure.                           After obtaining informed consent, the endoscope was  passed under direct vision. Throughout the                            procedure, the patient's blood pressure, pulse, and                            oxygen saturations were monitored continuously. The                            Endoscope was introduced through the mouth, and                            advanced to the second part of duodenum. The upper                            GI endoscopy was accomplished without  difficulty.                            The patient tolerated the procedure well. Scope In: Scope Out: Findings:                 The esophagus was normal.                           The stomach was normal.                           The examined duodenum was normal.                           The cardia and gastric fundus were normal on                            retroflexion. Complications:            No immediate complications. Estimated Blood Loss:     Estimated blood loss: none. Impression:               - Normal esophagus.                           - Normal stomach.                           - Normal examined duodenum.                           - No specimens collected. Recommendation:           - Patient has a contact number available for                            emergencies. The signs and symptoms of potential                            delayed complications were discussed with the  patient. Return to normal activities tomorrow.                            Written discharge instructions were provided to the                            patient.                           - Resume previous diet.                           - Continue present medications.                           - Please see colonoscopy report regarding findings                            and additional recommendations. Docia Chuck. Henrene Pastor, MD 02/18/2020 2:28:17 PM This report has been signed electronically.

## 2020-02-18 NOTE — Op Note (Signed)
[place] Patient Name: Gloria Werner Procedure Date: 02/18/2020 1:49 PM MRN: [id number] Endoscopist: Docia Chuck. Henrene Pastor , MD Age: 79 Referring MD:  Date of Birth: 1941/05/11 Gender: Female Account #: [id number] Procedure:                Colonoscopy Indications:              High risk colon cancer surveillance: Personal                            history of sessile serrated colon polyp (less than                            10 mm in size) with no dysplasia, Incidental                            abdominal pain noted, Incidental change in bowel                            habits noted. Previous examination September 2014 Medicines:                Monitored Anesthesia Care Procedure:                Pre-Anesthesia Assessment:                           - Prior to the procedure, a History and Physical                            was performed, and patient medications and                            allergies were reviewed. The patient's tolerance of                            previous anesthesia was also reviewed. The risks                            and benefits of the procedure and the sedation                            options and risks were discussed with the patient.                            All questions were answered, and informed consent                            was obtained. Prior Anticoagulants: The patient has                            taken no previous anticoagulant or antiplatelet                            agents. ASA Grade Assessment: II - A patient with  mild systemic disease. After reviewing the risks                            and benefits, the patient was deemed in                            satisfactory condition to undergo the procedure.                           After obtaining informed consent, the colonoscope                            was passed under direct vision. Throughout the                            procedure, the  patient's blood pressure, pulse, and                            oxygen saturations were monitored continuously. The                            Colonoscope was introduced through the anus and                            advanced to the the cecum, identified by                            appendiceal orifice and ileocecal valve. The                            ileocecal valve, appendiceal orifice, and rectum                            were photographed. The quality of the bowel                            preparation was good. The colonoscopy was performed                            without difficulty. The patient tolerated the                            procedure well. The bowel preparation used was                            SUPREP via split dose instruction. Scope In: 1:56:56 PM Scope Out: 2:16:19 PM Scope Withdrawal Time: 0 hours 8 minutes 54 seconds  Total Procedure Duration: 0 hours 19 minutes 23 seconds  Findings:                 Many small and large-mouthed diverticula were found                            in the entire colon. The changes were quite severe  in the sigmoid colon with stenosis.                           The exam was otherwise without abnormality on                            direct and retroflexion views. Complications:            No immediate complications. Estimated blood loss:                            None. Estimated Blood Loss:     Estimated blood loss: none. Impression:               - Diverticulosis, severe, in the entire examined                            colon.                           - The examination was otherwise normal on direct                            and retroflexion views.                           - No specimens collected. Recommendation:           - Repeat colonoscopy is not recommended for                            surveillance.                           - Patient has a contact number available for                             emergencies. The signs and symptoms of potential                            delayed complications were discussed with the                            patient. Return to normal activities tomorrow.                            Written discharge instructions were provided to the                            patient.                           - Resume previous diet.                           - Continue present medications.                           -  Add Citrucel 2 tablespoons daily in 12 ounces of                            water to your current bowel regimen                           - Prescribe Bentyl 10 mg; #60; 1 or 2 by mouth as                            needed for abdominal pain; 3 refills Loris Seelye N. Henrene Pastor, MD 02/18/2020 2:21:12 PM This report has been signed electronically.

## 2020-02-18 NOTE — Patient Instructions (Signed)
YOU HAD AN ENDOSCOPIC PROCEDURE TODAY AT THE [facility]:   Refer to the procedure report that was given to you for any specific questions about what was found during the examination.  If the procedure report does not answer your questions, please call your gastroenterologist to clarify.  If you requested that your care partner not be given the details of your procedure findings, then the procedure report has been included in a sealed envelope for you to review at your convenience later.  YOU SHOULD EXPECT: Some feelings of bloating in the abdomen. Passage of more gas than usual.  Walking can help get rid of the air that was put into your GI tract during the procedure and reduce the bloating. If you had a lower endoscopy (such as a colonoscopy or flexible sigmoidoscopy) you may notice spotting of blood in your stool or on the toilet paper. If you underwent a bowel prep for your procedure, you may not have a normal bowel movement for a few days.  Please Note:  You might notice some irritation and congestion in your nose or some drainage.  This is from the oxygen used during your procedure.  There is no need for concern and it should clear up in a day or so.  SYMPTOMS TO REPORT IMMEDIATELY:   Following lower endoscopy (colonoscopy or flexible sigmoidoscopy):  Excessive amounts of blood in the stool  Significant tenderness or worsening of abdominal pains  Swelling of the abdomen that is new, acute  Fever of 100F or higher   Following upper endoscopy (EGD)  Vomiting of blood or coffee ground material  New chest pain or pain under the shoulder blades  Painful or persistently difficult swallowing  New shortness of breath  Fever of 100F or higher  Black, tarry-looking stools  For urgent or emergent issues, a gastroenterologist can be reached at any hour by calling [phone].   DIET:  We do recommend a small meal at first, but then you may proceed to your regular diet.  Drink  plenty of fluids but you should avoid alcoholic beverages for 24 hours.  ACTIVITY:  You should plan to take it easy for the rest of today and you should NOT DRIVE or use heavy machinery until tomorrow (because of the sedation medicines used during the test).    FOLLOW UP: Our staff will call the number listed on your records 48-72 hours following your procedure to check on you and address any questions or concerns that you may have regarding the information given to you following your procedure. If we do not reach you, we will leave a message.  We will attempt to reach you two times.  During this call, we will ask if you have developed any symptoms of COVID 19. If you develop any symptoms (ie: fever, flu-like symptoms, shortness of breath, cough etc.) before then, please call [phone].  If you test positive for Covid 19 in the 2 weeks post procedure, please call and report this information to us.    If any biopsies were taken you will be contacted by phone or by letter within the next 1-3 weeks.  Please call us at [phone] if you have not heard about the biopsies in 3 weeks.    SIGNATURES/CONFIDENTIALITY: You and/or your care partner have signed paperwork which will be entered into your electronic medical record.  These signatures attest to the fact that that the information above on your After Visit Summary has been reviewed and is   understood.  Full responsibility of the confidentiality of this discharge information lies with you and/or your care-partner.   Resume medications., Add Citrucel 2 tablespoons daily in 12 ounces of water to current bowel regimen, Pick up Bentyl for pharmacy.

## 2020-02-20 ENCOUNTER — Telehealth: Payer: Self-pay

## 2020-02-20 NOTE — Telephone Encounter (Signed)
  Follow up Call-  Call back number 02/18/2020  Post procedure Call Back phone  # [phone]  Permission to leave phone message Yes  Some recent data might be hidden     Patient questions:  Do you have a fever, pain , or abdominal swelling? No. Pain Score  0 *  Have you tolerated food without any problems? Yes.    Have you been able to return to your normal activities? Yes.    Do you have any questions about your discharge instructions: Diet   No. Medications  No. Follow up visit  No.  Do you have questions or concerns about your Care? No.  Actions: * If pain score is 4 or above: No action needed, pain <4.  1. Have you developed a fever since your procedure? no  2.   Have you had an respiratory symptoms (SOB or cough) since your procedure? no  3.   Have you tested positive for COVID 19 since your procedure no  4.   Have you had any family members/close contacts diagnosed with the COVID 19 since your procedure?  no   If yes to any of these questions please route to Joylene John, RN and Alphonsa Gin, Therapist, sports.

## 2020-02-21 DIAGNOSIS — G4733 Obstructive sleep apnea (adult) (pediatric): Secondary | ICD-10-CM | POA: Diagnosis not present

## 2020-03-10 ENCOUNTER — Encounter (INDEPENDENT_AMBULATORY_CARE_PROVIDER_SITE_OTHER): Payer: Self-pay | Admitting: Ophthalmology

## 2020-03-10 ENCOUNTER — Ambulatory Visit (INDEPENDENT_AMBULATORY_CARE_PROVIDER_SITE_OTHER): Payer: Medicare Other | Admitting: Ophthalmology

## 2020-03-10 ENCOUNTER — Other Ambulatory Visit: Payer: Self-pay

## 2020-03-10 DIAGNOSIS — H3322 Serous retinal detachment, left eye: Secondary | ICD-10-CM

## 2020-03-10 DIAGNOSIS — H35033 Hypertensive retinopathy, bilateral: Secondary | ICD-10-CM | POA: Diagnosis not present

## 2020-03-10 DIAGNOSIS — H04123 Dry eye syndrome of bilateral lacrimal glands: Secondary | ICD-10-CM | POA: Diagnosis not present

## 2020-03-10 DIAGNOSIS — H33312 Horseshoe tear of retina without detachment, left eye: Secondary | ICD-10-CM

## 2020-03-10 DIAGNOSIS — H25813 Combined forms of age-related cataract, bilateral: Secondary | ICD-10-CM | POA: Diagnosis not present

## 2020-03-10 DIAGNOSIS — I10 Essential (primary) hypertension: Secondary | ICD-10-CM

## 2020-03-10 DIAGNOSIS — H3581 Retinal edema: Secondary | ICD-10-CM

## 2020-03-10 MED ORDER — PREDNISOLONE ACETATE 1 % OP SUSP: 1.0000 [drp] | Freq: Four times a day (QID) | OPHTHALMIC | 0 refills | Status: AC

## 2020-03-10 NOTE — Progress Notes (Addendum)
[facility] Note  03/10/2020     CHIEF COMPLAINT Patient presents for Retina Evaluation   HISTORY OF PRESENT ILLNESS: Gloria Werner is a 79 y.o. female who presents to the clinic today for:   HPI    Retina Evaluation    In left eye.  Onset: Unknown.  Duration: Unknown.  Associated Symptoms Photophobia and Glare.  Context:  distance vision, mid-range vision and near vision.  Treatments tried include artificial tears.  Response to treatment was no improvement.  I, the attending physician,  performed the HPI with the patient and updated documentation appropriately.          Comments    79 y/o female pt referred by Dr. Zenia Resides today for eval of HST OS.  Pt unsure of location, but thinks Dr. Katy Fitch said temporally OS.  Pt was seeing Dr. Katy Fitch for her routine eye exam, and had no idea anything was wrong.  Reports VA is good OU [place], but slightly blurred due to cataracts.  Denies pain, flashes, floaters.  C/o glare and halos at night.  AT prn OU.        Last edited by Bernarda Caffey, MD on 03/11/2020  5:35 PM. (History)    pt states she saw Dr. Wyatt Portela this morning for a routine eye exam, she says it has been about a year and a half since her last exam, she states Dr. Katy Fitch discovered a tear in her left eye that no one had previous seen before, she states Dr. Katy Fitch wants to do cataract sx, but told her the tear needs to be taken care of first, she endorses being hypertensive  Referring physician: Debbra Riding, MD [address] [place],  [place] [address]  HISTORICAL INFORMATION:   Selected notes from the MEDICAL RECORD NUMBER Referred by Dr. Wyatt Portela for concern of HST OS LEE:  Ocular Hx- PMH-   CURRENT MEDICATIONS: Current Outpatient Medications (Ophthalmic Drugs)  Medication Sig  . Carboxymethylcellul-Glycerin (LUBRICATING EYE DROPS OP) Place 1 drop into both eyes daily as needed (dry eyes).  . prednisoLONE acetate (PRED FORTE) 1 %  ophthalmic suspension Place 1 drop into the left eye 4 (four) times daily for 7 days.   No current facility-administered medications for this visit. (Ophthalmic Drugs)   Current Outpatient Medications (Other)  Medication Sig  . albuterol (VENTOLIN HFA) 108 (90 Base) MCG/ACT inhaler Inhale 2 puffs into the lungs every 6 (six) hours as needed for shortness of breath (cough).  Marland Kitchen amLODipine (NORVASC) 5 MG tablet Take 1 tablet (5 mg total) by mouth daily.  Marland Kitchen azelastine (ASTELIN) 0.1 % nasal spray Place 1 spray into both nostrils 2 (two) times daily as needed for rhinitis or allergies. Use in each nostril as directed  . buPROPion (WELLBUTRIN) 75 MG tablet Take 1 tablet (75 mg total) by mouth 2 (two) times daily.  . calcium carbonate (OSCAL) 1500 (600 Ca) MG TABS tablet Take by mouth daily with breakfast.  . dicyclomine (BENTYL) 10 MG capsule Take 1 or 2 by mouth as needed for abdominal pain  . ipratropium (ATROVENT) 0.03 % nasal spray Place 2 sprays into both nostrils every 12 (twelve) hours.  . Multiple Vitamin (MULTIVITAMIN) tablet Take 1 tablet by mouth daily.  . rosuvastatin (CRESTOR) 5 MG tablet Take 1 tablet (5 mg total) by mouth at bedtime.  Marland Kitchen saccharomyces boulardii (FLORASTOR) 250 MG capsule Take 2 capsules (500 mg total) by mouth 2 (two)  times daily.  Marland Kitchen triamcinolone (NASACORT) 55 MCG/ACT AERO nasal inhaler Place 2 sprays into the nose daily.  . Vitamin D, Ergocalciferol, (DRISDOL) 1.25 MG (50000 UT) CAPS capsule TAKE 1 CAPSULE BY MOUTH ONCE EVERY MONTH (EVERY  30  (THIRTY)  DAYS)   No current facility-administered medications for this visit. (Other)      REVIEW OF SYSTEMS: ROS    Positive for: Gastrointestinal, Musculoskeletal, Eyes, Respiratory   Negative for: Constitutional, Neurological, Skin, Genitourinary, HENT, Endocrine, Cardiovascular, Psychiatric, Allergic/Imm, Heme/Lymph   Last edited by Matthew Folks, COA on 03/10/2020  1:13 PM. (History)       ALLERGIES Allergies   Allergen Reactions  . Ace Inhibitors Cough  . Codeine Nausea And Vomiting  . Lexapro [Escitalopram] Diarrhea and Other (See Comments)    headache    PAST MEDICAL HISTORY Past Medical History:  Diagnosis Date  . Allergic rhinitis due to pollen   . Cataract    NS OU  . Complication of anesthesia   . Cough   . Gastroesophageal reflux disease without esophagitis   . Irritable bowel syndrome   . 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 PONV (postoperative nausea and vomiting)    Past Surgical History:  Procedure Laterality Date  . ABDOMINAL HYSTERECTOMY  1988   secondary to prolapse  . BLADDER SUSPENSION     A-P with Hyst  . INSERTION OF MESH  10/16/2018   Procedure: INSERTION OF MESH;  Surgeon: Ardis Hughs, MD;  Location: [facility];  Service: Urology;;  . NASAL SINUS SURGERY  1970  . ROBOTIC ASSISTED LAPAROSCOPIC SACROCOLPOPEXY N/A 10/16/2018   Procedure: XI ROBOTIC ASSISTED LAPAROSCOPIC SACROCOLPOPEXY;  Surgeon: Ardis Hughs, MD;  Location: [facility];  Service: Urology;  Laterality: N/A;  . TONSILLECTOMY      FAMILY HISTORY Family History  Problem Relation Age of Onset  . Emphysema Mother   . Lymphoma Mother   . Asthma Mother   . Cancer Father 86       lung cancer  . Coronary artery disease Brother   . Cancer Brother        breast cancer  . Breast cancer Brother   . Cancer Brother        lung cancer  . Diabetes Neg Hx   . Hypertension Neg Hx   . Colon cancer Neg Hx   . Esophageal cancer Neg Hx   . Rectal cancer Neg Hx   . Stomach cancer Neg Hx     SOCIAL HISTORY Social History   Tobacco Use  . Smoking status: Never Smoker  . Smokeless tobacco: Never Used  Substance Use Topics  . Alcohol use: No  . Drug use: No         OPHTHALMIC EXAM:  Base Eye Exam    Visual Acuity (Snellen - Linear)      Right Left   Dist [place] 20/100 -2 20/40 -2   Dist ph [place] 20/40 +2 20/40       Tonometry (Tonopen, 1:16 PM)      Right Left   Pressure 18 18        Pupils      Dark Light Shape React APD   Right 5 5 Round No None   Left 5 5 Round No None  Pharm dil OU       Visual Fields (Counting fingers)      Left Right    Full Full       Extraocular Movement  Right Left    Full, Ortho Full, Ortho       Neuro/Psych    Oriented x3: Yes   Mood/Affect: Normal       Dilation    Both eyes: 1.0% Mydriacyl, 2.5% Phenylephrine @ 1:16 PM        Slit Lamp and Fundus Exam    Slit Lamp Exam      Right Left   Lids/Lashes Mild Meibomian gland dysfunction Mild Meibomian gland dysfunction   Conjunctiva/Sclera White and quiet White and quiet   Cornea Arcus, 1+ Punctate epithelial erosions Arcus, trace endo pigment centrally, 1+ Punctate epithelial erosions   Anterior Chamber Deep and quiet Deep and quiet   Iris Round and dilated Round and dilated   Lens 2-3+ Nuclear sclerosis, 3+ Cortical cataract 2-3+ Nuclear sclerosis, 2+ Cortical cataract   Vitreous Vitreous syneresis, Posterior vitreous detachment, vitreous condensations Vitreous syneresis, Posterior vitreous detachment, vitreous condensations       Fundus Exam      Right Left   Disc Pallor, Sharp rim Pink and Sharp, mild PPP   C/D Ratio 0.1 0.1   Macula Flat, Blunted foveal reflex, mild RPE mottling and clumping Flat, Blunted foveal reflex, mild RPE mottling and clumping   Vessels Vascular attenuation, mild Tortuousity Vascular attenuation, mild Tortuousity   Periphery Attached, rare peripheral drusen, No heme, No RT/RD Attached, HST at 0200 with surrounding SRF and pigment clumping / CR scarring; scattered RPE changes        Refraction    Manifest Refraction      Sphere Cylinder Axis Dist VA   Right -1.50 +0.25 135 20/30   Left -0.75 Sphere  20/30          IMAGING AND PROCEDURES  Imaging and Procedures for @TODAY @  OCT, Retina - OU - Both Eyes       Right Eye Quality was good. Central Foveal Thickness: 250. Progression has no prior data. Findings include normal  foveal contour, no IRF, no SRF.   Left Eye Quality was good. Central Foveal Thickness: 300. Progression has no prior data. Findings include abnormal foveal contour, no IRF, no SRF, epiretinal membrane, macular pucker (Nasal ERM and pucker).   Notes *Images captured and stored on drive  Diagnosis / Impression:  NFP, no IRF/SRF OU OS: Nasal ERM and pucker  Clinical management:  See below  Abbreviations: NFP - Normal foveal profile. CME - cystoid macular edema. PED - pigment epithelial detachment. IRF - intraretinal fluid. SRF - subretinal fluid. EZ - ellipsoid zone. ERM - epiretinal membrane. ORA - outer retinal atrophy. ORT - outer retinal tubulation. SRHM - subretinal hyper-reflective material        Repair Retinal Detach, Photocoag - OS - Left Eye       LASER PROCEDURE NOTE  Procedure:  Barrier laser retinopexy using slit lamp laser, LEFT eye   Diagnosis:   Retinal tear w/ surrounding SRF and focal RD, LEFT eye                     Horseshoe tear at 2 o'clock anterior to equator   Surgeon: Bernarda Caffey, MD, PhD  Anesthesia: Topical  Informed consent obtained, operative eye marked, and time out performed prior to initiation of laser.   Laser settings:  Lumenis Smart532 laser, slit lamp Lens: [place] PRP 165 Power: 300 mW Spot size: 200 microns Duration: 30 msec  # spots: 456  Placement of laser: Using a [place] PRP 165 contact lens at the slit  lamp, laser was placed in three confluent rows around the horseshoe tear and focal RD at 2 oclock anterior to equator. Laser indirect ophthalmoscopy was performed to complete the anterior border of barrier retinopexy: 289 spots, 280 mW power, 100 ms duration.  Complications: None.  Patient tolerated the procedure well and received written and verbal post-procedure care information/education.                  ASSESSMENT/PLAN:    ICD-10-CM   1. Retinal tear of left eye  H33.312 Repair Retinal Detach, Photocoag - OS  - Left Eye  2. Left retinal detachment  H33.22 Repair Retinal Detach, Photocoag - OS - Left Eye  3. Retinal edema  H35.81 OCT, Retina - OU - Both Eyes  4. Essential hypertension  I10   5. 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 Horseshoe tear with surrounding SRF / focal RD OS  - The incidence, risk factors, and natural history of retinal tear was discussed with patient.    - Potential treatment options including laser retinopexy and cryotherapy discussed with patient.  - large HST located at 0200 with +SRF and surrounding pigment clumping/CR scarring  - recommend laser retinopexy OS today, 03.17.21  - pt wishes to proceed with laser  - RBA of procedure discussed, questions answered  - informed consent obtained and signed  - see procedure note  - start PF QID x7 days  - f/u in 1-2 wks  4,5. Hypertensive retinopathy OU  - discussed importance of tight BP control  - monitor  6. Mixed form age related cataract OU  - The symptoms of cataract, surgical options, and treatments and risks were discussed with patient.  - discussed diagnosis and progression  - under the expert management of Dr. Wyatt Portela   Ophthalmic Meds Ordered this visit:  Meds ordered this encounter  Medications  . prednisoLONE acetate (PRED FORTE) 1 % ophthalmic suspension    Sig: Place 1 drop into the left eye 4 (four) times daily for 7 days.    Dispense:  10 mL    Refill:  0       Return in about 2 weeks (around 03/24/2020) for f/u HST OS, DFE, OCT.  There are no Patient Instructions on file for this visit.   Explained the diagnoses, plan, and follow up with the patient and they expressed understanding.  Patient expressed understanding of the importance of proper follow up care.   This document serves as a record of services personally performed by Gardiner Sleeper, MD, PhD. It was created on their behalf by Ernest Mallick,  OA, an ophthalmic assistant. The creation of this record is the provider's dictation and/or activities during the visit.    Electronically signed by: Ernest Mallick, OA 03.17.2021 6:02 PM   Gardiner Sleeper, M.D., Ph.D. Diseases & Surgery of the Retina and Vitreous [facility]  I have reviewed the above documentation for accuracy and completeness, and I agree with the above. Gardiner Sleeper, M.D., Ph.D. 03/11/20 6:02 PM   Abbreviations: M myopia (nearsighted); A astigmatism; H hyperopia (farsighted); P presbyopia; Mrx spectacle prescription;  CTL contact lenses; OD right eye; OS left eye; OU both eyes  XT exotropia; ET esotropia; PEK punctate epithelial keratitis; PEE punctate epithelial erosions; DES dry eye syndrome; MGD meibomian gland dysfunction; ATs artificial tears; PFAT's preservative free artificial tears; [place] nuclear  sclerotic cataract; PSC posterior subcapsular cataract; ERM epi-retinal membrane; PVD posterior vitreous detachment; RD retinal detachment; DM diabetes mellitus; DR diabetic retinopathy; NPDR non-proliferative diabetic retinopathy; PDR proliferative diabetic retinopathy; CSME clinically significant macular edema; DME diabetic macular edema; dbh dot blot hemorrhages; CWS cotton wool spot; POAG primary open angle glaucoma; C/D cup-to-disc ratio; HVF humphrey visual field; GVF goldmann visual field; OCT optical coherence tomography; IOP intraocular pressure; BRVO Branch retinal vein occlusion; CRVO central retinal vein occlusion; CRAO central retinal artery occlusion; BRAO branch retinal artery occlusion; RT retinal tear; SB scleral buckle; PPV pars plana vitrectomy; VH Vitreous hemorrhage; PRP panretinal laser photocoagulation; IVK intravitreal kenalog; VMT vitreomacular traction; MH Macular hole;  NVD neovascularization of the disc; NVE neovascularization elsewhere; AREDS age related eye disease study; ARMD age related macular degeneration; POAG primary open  angle glaucoma; EBMD epithelial/anterior basement membrane dystrophy; ACIOL anterior chamber intraocular lens; IOL intraocular lens; PCIOL posterior chamber intraocular lens; Phaco/IOL phacoemulsification with intraocular lens placement; [place] photorefractive keratectomy; LASIK laser assisted in situ keratomileusis; HTN hypertension; DM diabetes mellitus; COPD chronic obstructive pulmonary disease

## 2020-03-17 NOTE — Progress Notes (Signed)
[facility] Note  03/24/2020     CHIEF COMPLAINT Patient presents for Post-op Follow-up   HISTORY OF PRESENT ILLNESS: Gloria Werner is a 79 y.o. female who presents to the clinic today for:   HPI    Post-op Follow-up    In left eye.  Discomfort includes pain and floaters.  Negative for itching, foreign body sensation, tearing and discharge.  Vision is stable.  I, the attending physician,  performed the HPI with the patient and updated documentation appropriately.          Comments    S/P Laser Break OS. Patient states had a some sharp pains in that eye, and some floaters .       Last edited by Bernarda Caffey, MD on 03/24/2020  8:50 AM. (History)    Pt states she felt like laser went well OS but has had some intermittent pain OS.  Referring physician: Lucille Passy, MD No address on file  HISTORICAL INFORMATION:   Selected notes from the MEDICAL RECORD NUMBER Referred by Dr. Wyatt Portela for concern of HST OS LEE:  Ocular Hx- PMH-   CURRENT MEDICATIONS: Current Outpatient Medications (Ophthalmic Drugs)  Medication Sig  . Carboxymethylcellul-Glycerin (LUBRICATING EYE DROPS OP) Place 1 drop into both eyes daily as needed (dry eyes).   No current facility-administered medications for this visit. (Ophthalmic Drugs)   Current Outpatient Medications (Other)  Medication Sig  . albuterol (VENTOLIN HFA) 108 (90 Base) MCG/ACT inhaler Inhale 2 puffs into the lungs every 6 (six) hours as needed for shortness of breath (cough).  Marland Kitchen amLODipine (NORVASC) 5 MG tablet Take 1 tablet (5 mg total) by mouth daily.  Marland Kitchen azelastine (ASTELIN) 0.1 % nasal spray Place 1 spray into both nostrils 2 (two) times daily as needed for rhinitis or allergies. Use in each nostril as directed  . buPROPion (WELLBUTRIN) 75 MG tablet Take 1 tablet (75 mg total) by mouth 2 (two) times daily.  . calcium carbonate (OSCAL) 1500 (600 Ca) MG TABS tablet Take by mouth daily with  breakfast.  . dicyclomine (BENTYL) 10 MG capsule Take 1 or 2 by mouth as needed for abdominal pain  . ipratropium (ATROVENT) 0.03 % nasal spray Place 2 sprays into both nostrils every 12 (twelve) hours.  . Multiple Vitamin (MULTIVITAMIN) tablet Take 1 tablet by mouth daily.  . rosuvastatin (CRESTOR) 5 MG tablet Take 1 tablet (5 mg total) by mouth at bedtime.  . saccharomyces boulardii (FLORASTOR) 250 MG capsule Take 2 capsules (500 mg total) by mouth 2 (two) times daily.  Marland Kitchen triamcinolone (NASACORT) 55 MCG/ACT AERO nasal inhaler Place 2 sprays into the nose daily.  . Vitamin D, Ergocalciferol, (DRISDOL) 1.25 MG (50000 UT) CAPS capsule TAKE 1 CAPSULE BY MOUTH ONCE EVERY MONTH (EVERY  30  (THIRTY)  DAYS)   No current facility-administered medications for this visit. (Other)      REVIEW OF SYSTEMS: ROS    Positive for: Gastrointestinal, Musculoskeletal, Eyes, Respiratory   Negative for: Constitutional, Neurological, Skin, Genitourinary, HENT, Endocrine, Cardiovascular, Psychiatric, Allergic/Imm, Heme/Lymph   Last edited by Elmore Guise, COT on 03/24/2020  8:24 AM. (History)       ALLERGIES Allergies  Allergen Reactions  . Ace Inhibitors Cough  . Codeine Nausea And Vomiting  . Lexapro [Escitalopram] Diarrhea and Other (See Comments)    headache    PAST MEDICAL HISTORY Past Medical History:  Diagnosis Date  . Allergic rhinitis due to pollen   .  Cataract    NS OU  . Complication of anesthesia   . Cough   . Gastroesophageal reflux disease without esophagitis   . Irritable bowel syndrome   . 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 PONV (postoperative nausea and vomiting)    Past Surgical History:  Procedure Laterality Date  . ABDOMINAL HYSTERECTOMY  1988   secondary to prolapse  . BLADDER SUSPENSION     A-P with Hyst  . INSERTION OF MESH  10/16/2018   Procedure: INSERTION OF MESH;  Surgeon: Ardis Hughs, MD;  Location: [facility];  Service: Urology;;  . NASAL SINUS SURGERY   1970  . ROBOTIC ASSISTED LAPAROSCOPIC SACROCOLPOPEXY N/A 10/16/2018   Procedure: XI ROBOTIC ASSISTED LAPAROSCOPIC SACROCOLPOPEXY;  Surgeon: Ardis Hughs, MD;  Location: [facility];  Service: Urology;  Laterality: N/A;  . TONSILLECTOMY      FAMILY HISTORY Family History  Problem Relation Age of Onset  . Emphysema Mother   . Lymphoma Mother   . Asthma Mother   . Cancer Father 34       lung cancer  . Coronary artery disease Brother   . Cancer Brother        breast cancer  . Breast cancer Brother   . Cancer Brother        lung cancer  . Diabetes Neg Hx   . Hypertension Neg Hx   . Colon cancer Neg Hx   . Esophageal cancer Neg Hx   . Rectal cancer Neg Hx   . Stomach cancer Neg Hx     SOCIAL HISTORY Social History   Tobacco Use  . Smoking status: Never Smoker  . Smokeless tobacco: Never Used  Substance Use Topics  . Alcohol use: No  . Drug use: No         OPHTHALMIC EXAM:  Base Eye Exam    Visual Acuity (Snellen - Linear)      Right Left   Dist [place] 20/150 20/40   Dist ph [place] 20/40 20/30-2       Tonometry (Tonopen, 8:26 AM)      Right Left   Pressure 24 18       Pupils      Dark Light Shape React APD   Right 3 2 Round Brisk None   Left 3 2 Round Brisk None       Visual Fields (Counting fingers)      Left Right    Full Full       Extraocular Movement      Right Left    Full, Ortho Full, Ortho       Neuro/Psych    Oriented x3: Yes   Mood/Affect: Normal       Dilation    Both eyes: 1.0% Mydriacyl, 2.5% Phenylephrine @ 8:26 AM        Slit Lamp and Fundus Exam    Slit Lamp Exam      Right Left   Lids/Lashes Mild Meibomian gland dysfunction Mild Meibomian gland dysfunction   Conjunctiva/Sclera White and quiet White and quiet   Cornea Arcus, 1+ Punctate epithelial erosions Arcus, trace endo pigment centrally, 1+ Punctate epithelial erosions   Anterior Chamber Deep and quiet Deep and quiet   Iris Round and dilated Round and dilated   Lens  2-3+ Nuclear sclerosis, 3+ Cortical cataract 2-3+ Nuclear sclerosis, 2+ Cortical cataract   Vitreous Vitreous syneresis, Posterior vitreous detachment, vitreous condensations Vitreous syneresis, Posterior vitreous detachment, vitreous condensations  Fundus Exam      Right Left   Disc Pallor, Sharp rim Pink and Sharp, mild PPP   C/D Ratio 0.1 0.1   Macula Flat, Blunted foveal reflex, mild RPE mottling and clumping Flat, Blunted foveal reflex, mild RPE mottling and clumping, +ERM   Vessels Vascular attenuation, mild Tortuousity Vascular attenuation, mild Tortuousity   Periphery Attached, rare peripheral drusen, No heme, No RT/RD Attached, HST at 0200 with surrounding SRF and pigment clumping / CR scarring--good laser changes surrounding--not fully mature posteriorly; scattered RPE changes          IMAGING AND PROCEDURES  Imaging and Procedures for @TODAY @  OCT, Retina - OU - Both Eyes       Right Eye Quality was good. Central Foveal Thickness: 249. Progression has been stable. Findings include normal foveal contour, no IRF, no SRF.   Left Eye Quality was good. Central Foveal Thickness: 312. Progression has been stable. Findings include abnormal foveal contour, no IRF, no SRF, epiretinal membrane, macular pucker (Nasal ERM and pucker).   Notes *Images captured and stored on drive  Diagnosis / Impression:  NFP, no IRF/SRF OU OS: Nasal ERM and pucker  Clinical management:  See below  Abbreviations: NFP - Normal foveal profile. CME - cystoid macular edema. PED - pigment epithelial detachment. IRF - intraretinal fluid. SRF - subretinal fluid. EZ - ellipsoid zone. ERM - epiretinal membrane. ORA - outer retinal atrophy. ORT - outer retinal tubulation. SRHM - subretinal hyper-reflective material                 ASSESSMENT/PLAN:    ICD-10-CM   1. Retinal tear of left eye  H33.312   2. Left retinal detachment  H33.22   3. Retinal edema  H35.81 OCT, Retina - OU - Both  Eyes  4. Essential hypertension  I10   5. 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 Horseshoe tear with surrounding SRF / focal RD OS  - large HST located at 0200 with +SRF and surrounding pigment clumping/CR scarring  - s/p laser retinopexy OS 03.17.21 -- good early laser changes, not fully mature  - completed PF QID x7 days             - f/u in 2-3 weeks for recheck  4,5. Hypertensive retinopathy OU  - discussed importance of tight BP control  - monitor  6. Mixed form age related cataract OU  - The symptoms of cataract, surgical options, and treatments and risks were discussed with patient.  - discussed diagnosis and progression  - under the expert management of Dr. Wyatt Portela   Ophthalmic Meds Ordered this visit:  No orders of the defined types were placed in this encounter.      Return in about 2 weeks (around 04/07/2020) for Dilated exam, OCT, post laser OS.  There are no Patient Instructions on file for this visit.   Explained the diagnoses, plan, and follow up with the patient and they expressed understanding.  Patient expressed understanding of the importance of proper follow up care.   Gardiner Sleeper, M.D., Ph.D. Diseases & Surgery of the Retina and [place] 03/24/2020   I have reviewed the above documentation for accuracy and completeness, and I agree with the above. Gardiner Sleeper, M.D., Ph.D. 03/25/20 8:22 PM   Abbreviations: M myopia (nearsighted); A astigmatism; H hyperopia (farsighted); P  presbyopia; Mrx spectacle prescription;  CTL contact lenses; OD right eye; OS left eye; OU both eyes  XT exotropia; ET esotropia; PEK punctate epithelial keratitis; PEE punctate epithelial erosions; DES dry eye syndrome; MGD meibomian gland dysfunction; ATs artificial tears; PFAT's preservative free artificial tears; [place] nuclear sclerotic cataract; PSC  posterior subcapsular cataract; ERM epi-retinal membrane; PVD posterior vitreous detachment; RD retinal detachment; DM diabetes mellitus; DR diabetic retinopathy; NPDR non-proliferative diabetic retinopathy; PDR proliferative diabetic retinopathy; CSME clinically significant macular edema; DME diabetic macular edema; dbh dot blot hemorrhages; CWS cotton wool spot; POAG primary open angle glaucoma; C/D cup-to-disc ratio; HVF humphrey visual field; GVF goldmann visual field; OCT optical coherence tomography; IOP intraocular pressure; BRVO Branch retinal vein occlusion; CRVO central retinal vein occlusion; CRAO central retinal artery occlusion; BRAO branch retinal artery occlusion; RT retinal tear; SB scleral buckle; PPV pars plana vitrectomy; VH Vitreous hemorrhage; PRP panretinal laser photocoagulation; IVK intravitreal kenalog; VMT vitreomacular traction; MH Macular hole;  NVD neovascularization of the disc; NVE neovascularization elsewhere; AREDS age related eye disease study; ARMD age related macular degeneration; POAG primary open angle glaucoma; EBMD epithelial/anterior basement membrane dystrophy; ACIOL anterior chamber intraocular lens; IOL intraocular lens; PCIOL posterior chamber intraocular lens; Phaco/IOL phacoemulsification with intraocular lens placement; [place] photorefractive keratectomy; LASIK laser assisted in situ keratomileusis; HTN hypertension; DM diabetes mellitus; COPD chronic obstructive pulmonary disease

## 2020-03-24 ENCOUNTER — Ambulatory Visit (INDEPENDENT_AMBULATORY_CARE_PROVIDER_SITE_OTHER): Payer: Medicare Other | Admitting: Ophthalmology

## 2020-03-24 ENCOUNTER — Encounter (INDEPENDENT_AMBULATORY_CARE_PROVIDER_SITE_OTHER): Payer: Self-pay | Admitting: Ophthalmology

## 2020-03-24 DIAGNOSIS — H25813 Combined forms of age-related cataract, bilateral: Secondary | ICD-10-CM

## 2020-03-24 DIAGNOSIS — I10 Essential (primary) hypertension: Secondary | ICD-10-CM

## 2020-03-24 DIAGNOSIS — H33312 Horseshoe tear of retina without detachment, left eye: Secondary | ICD-10-CM

## 2020-03-24 DIAGNOSIS — H3322 Serous retinal detachment, left eye: Secondary | ICD-10-CM

## 2020-03-24 DIAGNOSIS — H3581 Retinal edema: Secondary | ICD-10-CM | POA: Diagnosis not present

## 2020-03-24 DIAGNOSIS — H35033 Hypertensive retinopathy, bilateral: Secondary | ICD-10-CM

## 2020-03-25 ENCOUNTER — Encounter (INDEPENDENT_AMBULATORY_CARE_PROVIDER_SITE_OTHER): Payer: Self-pay | Admitting: Ophthalmology

## 2020-04-06 NOTE — Progress Notes (Signed)
[facility] Note  04/07/2020     CHIEF COMPLAINT Patient presents for Retina Follow Up   HISTORY OF PRESENT ILLNESS: Gloria Werner is a 79 y.o. female who presents to the clinic today for:   HPI    Retina Follow Up    Patient presents with  Other.  In left eye.  This started 2 weeks ago.  Severity is moderate.  I, the attending physician,  performed the HPI with the patient and updated documentation appropriately.          Comments    Patient here for 2 weeks retina follow up for dilated exam s/p laser OS. Patient states vision today OS has a little bit of cloud on it. Vision OD waiting for cat sx. No eye pain.       Last edited by Bernarda Caffey, MD on 04/07/2020  2:29 PM. (History)    Pt states she is doing well, she states her vision seems "cloudier" today, pt is scheduled for cataract surgery on April 30, OD with Dr. Zenia Resides  Referring physician: Lucille Passy, MD No address on file  HISTORICAL INFORMATION:   Selected notes from the MEDICAL RECORD NUMBER Referred by Dr. Wyatt Portela for concern of HST OS LEE:  Ocular Hx- PMH-   CURRENT MEDICATIONS: Current Outpatient Medications (Ophthalmic Drugs)  Medication Sig  . Carboxymethylcellul-Glycerin (LUBRICATING EYE DROPS OP) Place 1 drop into both eyes daily as needed (dry eyes).   No current facility-administered medications for this visit. (Ophthalmic Drugs)   Current Outpatient Medications (Other)  Medication Sig  . albuterol (VENTOLIN HFA) 108 (90 Base) MCG/ACT inhaler Inhale 2 puffs into the lungs every 6 (six) hours as needed for shortness of breath (cough).  Marland Kitchen amLODipine (NORVASC) 5 MG tablet Take 1 tablet (5 mg total) by mouth daily.  Marland Kitchen azelastine (ASTELIN) 0.1 % nasal spray Place 1 spray into both nostrils 2 (two) times daily as needed for rhinitis or allergies. Use in each nostril as directed  . buPROPion (WELLBUTRIN) 75 MG tablet Take 1 tablet (75 mg total) by mouth 2  (two) times daily.  . calcium carbonate (OSCAL) 1500 (600 Ca) MG TABS tablet Take by mouth daily with breakfast.  . dicyclomine (BENTYL) 10 MG capsule Take 1 or 2 by mouth as needed for abdominal pain  . ipratropium (ATROVENT) 0.03 % nasal spray Place 2 sprays into both nostrils every 12 (twelve) hours.  . Multiple Vitamin (MULTIVITAMIN) tablet Take 1 tablet by mouth daily.  . rosuvastatin (CRESTOR) 5 MG tablet Take 1 tablet (5 mg total) by mouth at bedtime.  . saccharomyces boulardii (FLORASTOR) 250 MG capsule Take 2 capsules (500 mg total) by mouth 2 (two) times daily.  Marland Kitchen triamcinolone (NASACORT) 55 MCG/ACT AERO nasal inhaler Place 2 sprays into the nose daily.  . Vitamin D, Ergocalciferol, (DRISDOL) 1.25 MG (50000 UT) CAPS capsule TAKE 1 CAPSULE BY MOUTH ONCE EVERY MONTH (EVERY  30  (THIRTY)  DAYS)   No current facility-administered medications for this visit. (Other)      REVIEW OF SYSTEMS: ROS    Positive for: Gastrointestinal, Musculoskeletal, Eyes, Respiratory   Negative for: Constitutional, Neurological, Skin, Genitourinary, HENT, Endocrine, Cardiovascular, Psychiatric, Allergic/Imm, Heme/Lymph   Last edited by Theodore Demark, COA on 04/07/2020  1:52 PM. (History)       ALLERGIES Allergies  Allergen Reactions  . Ace Inhibitors Cough  . Codeine Nausea And Vomiting  . Lexapro [Escitalopram] Diarrhea and Other (  See Comments)    headache    PAST MEDICAL HISTORY Past Medical History:  Diagnosis Date  . Allergic rhinitis due to pollen   . Cataract    NS OU  . Complication of anesthesia   . Cough   . Gastroesophageal reflux disease without esophagitis   . Irritable bowel syndrome   . 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 PONV (postoperative nausea and vomiting)    Past Surgical History:  Procedure Laterality Date  . ABDOMINAL HYSTERECTOMY  1988   secondary to prolapse  . BLADDER SUSPENSION     A-P with Hyst  . INSERTION OF MESH  10/16/2018   Procedure: INSERTION OF  MESH;  Surgeon: Ardis Hughs, MD;  Location: [facility];  Service: Urology;;  . NASAL SINUS SURGERY  1970  . ROBOTIC ASSISTED LAPAROSCOPIC SACROCOLPOPEXY N/A 10/16/2018   Procedure: XI ROBOTIC ASSISTED LAPAROSCOPIC SACROCOLPOPEXY;  Surgeon: Ardis Hughs, MD;  Location: [facility];  Service: Urology;  Laterality: N/A;  . TONSILLECTOMY      FAMILY HISTORY Family History  Problem Relation Age of Onset  . Emphysema Mother   . Lymphoma Mother   . Asthma Mother   . Cancer Father 51       lung cancer  . Coronary artery disease Brother   . Cancer Brother        breast cancer  . Breast cancer Brother   . Cancer Brother        lung cancer  . Diabetes Neg Hx   . Hypertension Neg Hx   . Colon cancer Neg Hx   . Esophageal cancer Neg Hx   . Rectal cancer Neg Hx   . Stomach cancer Neg Hx     SOCIAL HISTORY Social History   Tobacco Use  . Smoking status: Never Smoker  . Smokeless tobacco: Never Used  Substance Use Topics  . Alcohol use: No  . Drug use: No         OPHTHALMIC EXAM:  Base Eye Exam    Visual Acuity (Snellen - Linear)      Right Left   Dist [place] 20/50 20/50 +2   Dist ph  20/30 20/40       Tonometry (Tonopen, 1:48 PM)      Right Left   Pressure 20 18       Pupils      Dark Light Shape React APD   Right 3 2 Round Brisk None   Left 3 2 Round Brisk None       Visual Fields (Counting fingers)      Left Right    Full Full       Extraocular Movement      Right Left    Full, Ortho Full, Ortho       Neuro/Psych    Oriented x3: Yes   Mood/Affect: Normal       Dilation    Both eyes: 1.0% Mydriacyl, 2.5% Phenylephrine @ 1:48 PM        Slit Lamp and Fundus Exam    Slit Lamp Exam      Right Left   Lids/Lashes Mild Meibomian gland dysfunction Mild Meibomian gland dysfunction   Conjunctiva/Sclera White and quiet White and quiet   Cornea Arcus, 1+ Punctate epithelial erosions Arcus, trace endo pigment centrally, 1+ Punctate epithelial erosions    Anterior Chamber Deep and quiet Deep and quiet   Iris Round and dilated Round and dilated   Lens 2-3+ Nuclear sclerosis, 3+ Cortical cataract  2-3+ Nuclear sclerosis, 2+ Cortical cataract   Vitreous Vitreous syneresis, Posterior vitreous detachment, vitreous condensations Vitreous syneresis, Posterior vitreous detachment, vitreous condensations       Fundus Exam      Right Left   Disc Pallor, Sharp rim Pink and Sharp, mild PPP   C/D Ratio 0.1 0.1   Macula Flat, Blunted foveal reflex, mild RPE mottling and clumping Flat, Blunted foveal reflex, mild RPE mottling and clumping, +ERM   Vessels Vascular attenuation, mild Tortuousity Vascular attenuation, mild Tortuousity   Periphery Attached, rare peripheral drusen, No heme, No RT/RD Attached, HST at 0200 with surrounding SRF and pigment clumping / CR scarring -- good laser changes surrounding, scattered RPE changes          IMAGING AND PROCEDURES  Imaging and Procedures for @TODAY @  OCT, Retina - OU - Both Eyes       Right Eye Quality was good. Central Foveal Thickness: 252. Progression has been stable. Findings include normal foveal contour, no IRF, no SRF.   Left Eye Quality was good. Central Foveal Thickness: 316. Progression has been stable. Findings include abnormal foveal contour, no IRF, no SRF, epiretinal membrane, macular pucker (Nasal ERM and pucker).   Notes *Images captured and stored on drive  Diagnosis / Impression:  NFP, no IRF/SRF OU OS: Nasal ERM and pucker  Clinical management:  See below  Abbreviations: NFP - Normal foveal profile. CME - cystoid macular edema. PED - pigment epithelial detachment. IRF - intraretinal fluid. SRF - subretinal fluid. EZ - ellipsoid zone. ERM - epiretinal membrane. ORA - outer retinal atrophy. ORT - outer retinal tubulation. SRHM - subretinal hyper-reflective material                 ASSESSMENT/PLAN:    ICD-10-CM   1. Retinal tear of left eye  H33.312   2. Left  retinal detachment  H33.22   3. Retinal edema  H35.81 OCT, Retina - OU - Both Eyes  4. Essential hypertension  I10   5. 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 Horseshoe tear with surrounding SRF / focal RD OS  - large HST located at 0200 with +SRF and surrounding pigment clumping/CR scarring  - s/p laser retinopexy OS 03.17.21 -- good laser changes in place  - completed PF QID x7 days             - f/u in 3 months  4,5. Hypertensive retinopathy OU  - discussed importance of tight BP control  - monitor  6. Mixed form age related cataract OU  - The symptoms of cataract, surgical options, and treatments and risks were discussed with patient.  - discussed diagnosis and progression  - under the expert management of Dr. Wyatt Portela   Ophthalmic Meds Ordered this visit:  No orders of the defined types were placed in this encounter.     This document serves as a record of services personally performed by Gardiner Sleeper, MD, PhD. It was created on their behalf by Ernest Mallick, OA, an ophthalmic assistant. The creation of this record is the provider's dictation and/or activities during the visit.    Electronically signed by: Ernest Mallick, OA 04.14.2021 3:50 PM   Return in about 3 months (around 07/07/2020) for f/u HST OS, DFE, OCT.  There are no Patient Instructions on file for this visit.  Gardiner Sleeper, M.D., Ph.D. Diseases & Surgery  of the Retina and [place] 04/07/2020   I have reviewed the above documentation for accuracy and completeness, and I agree with the above. Gardiner Sleeper, M.D., Ph.D. 04/08/20 3:51 PM     Abbreviations: M myopia (nearsighted); A astigmatism; H hyperopia (farsighted); P presbyopia; Mrx spectacle prescription;  CTL contact lenses; OD right eye; OS left eye; OU both eyes  XT exotropia; ET esotropia; PEK punctate epithelial  keratitis; PEE punctate epithelial erosions; DES dry eye syndrome; MGD meibomian gland dysfunction; ATs artificial tears; PFAT's preservative free artificial tears; [place] nuclear sclerotic cataract; PSC posterior subcapsular cataract; ERM epi-retinal membrane; PVD posterior vitreous detachment; RD retinal detachment; DM diabetes mellitus; DR diabetic retinopathy; NPDR non-proliferative diabetic retinopathy; PDR proliferative diabetic retinopathy; CSME clinically significant macular edema; DME diabetic macular edema; dbh dot blot hemorrhages; CWS cotton wool spot; POAG primary open angle glaucoma; C/D cup-to-disc ratio; HVF humphrey visual field; GVF goldmann visual field; OCT optical coherence tomography; IOP intraocular pressure; BRVO Branch retinal vein occlusion; CRVO central retinal vein occlusion; CRAO central retinal artery occlusion; BRAO branch retinal artery occlusion; RT retinal tear; SB scleral buckle; PPV pars plana vitrectomy; VH Vitreous hemorrhage; PRP panretinal laser photocoagulation; IVK intravitreal kenalog; VMT vitreomacular traction; MH Macular hole;  NVD neovascularization of the disc; NVE neovascularization elsewhere; AREDS age related eye disease study; ARMD age related macular degeneration; POAG primary open angle glaucoma; EBMD epithelial/anterior basement membrane dystrophy; ACIOL anterior chamber intraocular lens; IOL intraocular lens; PCIOL posterior chamber intraocular lens; Phaco/IOL phacoemulsification with intraocular lens placement; [place] photorefractive keratectomy; LASIK laser assisted in situ keratomileusis; HTN hypertension; DM diabetes mellitus; COPD chronic obstructive pulmonary disease

## 2020-04-07 ENCOUNTER — Other Ambulatory Visit: Payer: Self-pay

## 2020-04-07 ENCOUNTER — Ambulatory Visit (INDEPENDENT_AMBULATORY_CARE_PROVIDER_SITE_OTHER): Payer: Medicare Other | Admitting: Ophthalmology

## 2020-04-07 ENCOUNTER — Encounter (INDEPENDENT_AMBULATORY_CARE_PROVIDER_SITE_OTHER): Payer: Self-pay | Admitting: Ophthalmology

## 2020-04-07 DIAGNOSIS — H3581 Retinal edema: Secondary | ICD-10-CM

## 2020-04-07 DIAGNOSIS — I10 Essential (primary) hypertension: Secondary | ICD-10-CM

## 2020-04-07 DIAGNOSIS — H35033 Hypertensive retinopathy, bilateral: Secondary | ICD-10-CM

## 2020-04-07 DIAGNOSIS — H25813 Combined forms of age-related cataract, bilateral: Secondary | ICD-10-CM

## 2020-04-07 DIAGNOSIS — H33312 Horseshoe tear of retina without detachment, left eye: Secondary | ICD-10-CM

## 2020-04-07 DIAGNOSIS — H3322 Serous retinal detachment, left eye: Secondary | ICD-10-CM

## 2020-04-19 DIAGNOSIS — M9903 Segmental and somatic dysfunction of lumbar region: Secondary | ICD-10-CM | POA: Diagnosis not present

## 2020-04-19 DIAGNOSIS — M546 Pain in thoracic spine: Secondary | ICD-10-CM | POA: Diagnosis not present

## 2020-04-19 DIAGNOSIS — M9902 Segmental and somatic dysfunction of thoracic region: Secondary | ICD-10-CM | POA: Diagnosis not present

## 2020-04-19 DIAGNOSIS — M545 Low back pain: Secondary | ICD-10-CM | POA: Diagnosis not present

## 2020-04-23 DIAGNOSIS — H25811 Combined forms of age-related cataract, right eye: Secondary | ICD-10-CM | POA: Diagnosis not present

## 2020-04-27 DIAGNOSIS — H59021 Cataract (lens) fragments in eye following cataract surgery, right eye: Secondary | ICD-10-CM | POA: Diagnosis not present

## 2020-04-27 DIAGNOSIS — H2181 Floppy iris syndrome: Secondary | ICD-10-CM | POA: Diagnosis not present

## 2020-05-17 DIAGNOSIS — M546 Pain in thoracic spine: Secondary | ICD-10-CM | POA: Diagnosis not present

## 2020-05-17 DIAGNOSIS — M9902 Segmental and somatic dysfunction of thoracic region: Secondary | ICD-10-CM | POA: Diagnosis not present

## 2020-05-17 DIAGNOSIS — M9903 Segmental and somatic dysfunction of lumbar region: Secondary | ICD-10-CM | POA: Diagnosis not present

## 2020-05-17 DIAGNOSIS — M545 Low back pain: Secondary | ICD-10-CM | POA: Diagnosis not present

## 2020-05-26 NOTE — Progress Notes (Signed)
[facility] Note  05/31/2020     CHIEF COMPLAINT Patient presents for Retina Evaluation   HISTORY OF PRESENT ILLNESS: Gloria Werner is a 79 y.o. female who presents to the clinic today for:   HPI    Retina Evaluation    In right eye.  Associated Symptoms Trauma.  Negative for Flashes, Distortion, Pain, Photophobia, Jaw Claudication, Fever, Fatigue, Weight Loss, Shoulder/Hip pain, Scalp Tenderness, Glare, Redness, Blind Spot and Floaters.  Context:  distance vision, mid-range vision and near vision.  Treatments tried include no treatments.  I, the attending physician,  performed the HPI with the patient and updated documentation appropriately.          Comments    Pt here on the referral of Dr. Wyatt Portela for concern of vitreous debris OD, pt states she had cataract sx on her right eye and her vision did not clear up, she states she went back to see Dr. Katy Fitch and he said she had some remaining cataract remnants that he went back in and got out, pt states vision has still not cleared up, but states she is not seeing fol or floaters, just an overall haze, pt states she fell last Monday and hit her face, pt had a tear repaired a few months ago on her left eye and would like that checked again today, pt feels like she sees better at a distance with her right eye, but feels like near vision is worse than before cat sx       Last edited by Bernarda Caffey, MD on 05/31/2020  8:30 AM. (History)    Pt states she had cataract sx with Dr. Zenia Resides in April, she states her vision has not cleared up since then, she went back to see him in f/u and he noticed some remaining cataract remnants that he went back in and got out, pt feels like vision still has not cleared up since then, she states Dr. Katy Fitch told her she has some debris in the back of her eye, pt states she has not seen any fol or floaters, but her vision feels like it has an overall haze or fog to it, pt fell  this past Monday and hit her face, she did not seek medical treatment afterwards, her next appt with Dr. Katy Fitch is Thursday, 06.10.21  Referring physician: Debbra Riding, MD [address] [place],  [place] [address]  HISTORICAL INFORMATION:   Selected notes from the MEDICAL RECORD NUMBER Referred by Dr. Wyatt Portela for concern of HST OS LEE: 05.13.21, BCVA OD: 20/20 OS: 20/20 Ocular Hx- CE with IOL OD 04.30.21, lens fragment removal OD on 05.04.21 PMH- HTN   CURRENT MEDICATIONS: Current Outpatient Medications (Ophthalmic Drugs)  Medication Sig   Carboxymethylcellul-Glycerin (LUBRICATING EYE DROPS OP) Place 1 drop into both eyes daily as needed (dry eyes).   No current facility-administered medications for this visit. (Ophthalmic Drugs)   Current Outpatient Medications (Other)  Medication Sig   albuterol (VENTOLIN HFA) 108 (90 Base) MCG/ACT inhaler Inhale 2 puffs into the lungs every 6 (six) hours as needed for shortness of breath (cough).   amLODipine (NORVASC) 5 MG tablet Take 1 tablet (5 mg total) by mouth daily.   azelastine (ASTELIN) 0.1 % nasal spray Place 1 spray into both nostrils 2 (two) times daily as needed for rhinitis or allergies. Use in each nostril as directed   buPROPion (WELLBUTRIN) 75 MG tablet Take 1 tablet (75  mg total) by mouth 2 (two) times daily.   calcium carbonate (OSCAL) 1500 (600 Ca) MG TABS tablet Take by mouth daily with breakfast.   dicyclomine (BENTYL) 10 MG capsule Take 1 or 2 by mouth as needed for abdominal pain   ipratropium (ATROVENT) 0.03 % nasal spray Place 2 sprays into both nostrils every 12 (twelve) hours.   Multiple Vitamin (MULTIVITAMIN) tablet Take 1 tablet by mouth daily.   rosuvastatin (CRESTOR) 5 MG tablet Take 1 tablet (5 mg total) by mouth at bedtime.   saccharomyces boulardii (FLORASTOR) 250 MG capsule Take 2 capsules (500 mg total) by mouth 2 (two) times daily.   triamcinolone (NASACORT) 55 MCG/ACT AERO nasal inhaler  Place 2 sprays into the nose daily.   Vitamin D, Ergocalciferol, (DRISDOL) 1.25 MG (50000 UT) CAPS capsule TAKE 1 CAPSULE BY MOUTH ONCE EVERY MONTH (EVERY  30  (THIRTY)  DAYS)   No current facility-administered medications for this visit. (Other)      REVIEW OF SYSTEMS: ROS    Positive for: Eyes   Negative for: Constitutional, Gastrointestinal, Neurological, Skin, Genitourinary, Musculoskeletal, HENT, Endocrine, Cardiovascular, Respiratory, Psychiatric, Allergic/Imm, Heme/Lymph   Last edited by Debbrah Alar, COT on 05/31/2020  8:10 AM. 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   PONV (postoperative nausea and vomiting)    Past Surgical History:  Procedure Laterality Date   ABDOMINAL HYSTERECTOMY  1988   secondary to prolapse   BLADDER SUSPENSION     A-P with Hyst   INSERTION OF MESH  10/16/2018   Procedure: INSERTION OF MESH;  Surgeon: Ardis Hughs, MD;  Location: [facility];  Service: Urology;;   NASAL SINUS SURGERY  1970   ROBOTIC ASSISTED LAPAROSCOPIC SACROCOLPOPEXY N/A 10/16/2018   Procedure: XI ROBOTIC ASSISTED LAPAROSCOPIC SACROCOLPOPEXY;  Surgeon: Ardis Hughs, MD;  Location: [facility];  Service: Urology;  Laterality: N/A;   TONSILLECTOMY      FAMILY HISTORY Family History  Problem Relation Age of Onset   Emphysema Mother    Lymphoma Mother    Asthma Mother    Cancer Father 57       lung cancer   Coronary artery disease Brother    Cancer Brother        breast cancer   Breast cancer Brother    Cancer Brother         lung cancer   Diabetes Neg Hx    Hypertension Neg Hx    Colon cancer Neg Hx    Esophageal cancer Neg Hx    Rectal cancer Neg Hx    Stomach cancer Neg Hx     SOCIAL HISTORY Social History   Tobacco Use   Smoking status: Never Smoker   Smokeless tobacco: Never Used  Substance Use Topics   Alcohol use: No   Drug use: No         OPHTHALMIC EXAM:  Base Eye Exam    Visual Acuity (Snellen - Linear)      Right Left  Dist cc 20/30 -1 20/50   Dist ph cc 20/25 -2 20/40       Tonometry (Tonopen, 8:16 AM)      Right Left   Pressure 15 19       Pupils      Dark Light Shape React APD   Right 4 3 Round Brisk None   Left 4 3 Round Brisk None       Visual Fields (Counting fingers)      Left Right    Full Full       Extraocular Movement      Right Left    Full, Ortho Full, Ortho       Neuro/Psych    Oriented x3: Yes   Mood/Affect: Normal       Dilation    Both eyes: 1.0% Mydriacyl, 2.5% Phenylephrine @ 8:16 AM        Slit Lamp and Fundus Exam    Slit Lamp Exam      Right Left   Lids/Lashes Dermatochalasis - upper lid, Meibomian gland dysfunction Dermatochalasis - upper lid, Meibomian gland dysfunction   Conjunctiva/Sclera White and quiet White and quiet   Cornea Arcus, well healed temporal cataract wound Arcus, trace Punctate epithelial erosions   Anterior Chamber Deep, 0.5+cell/pigment Deep and quiet   Iris Mildly irregular dilation, Transillumination defects temporally Round and dilated   Lens PC IOL in good position, mild pigment on posterior capsule 2-3+ Nuclear sclerosis, 2-3+ Cortical cataract   Vitreous Vitreous syneresis, +pigment, Posterior vitreous detachment, prominent vitreous condensation IT quad Vitreous syneresis, Posterior vitreous detachment, vitreous condensations       Fundus Exam      Right Left   Disc mild Pallor, Sharp rim Pink and Sharp, mild temporal PPP   C/D Ratio 0.1 0.1   Macula Flat, Blunted foveal reflex, mild  RPE mottling and clumping, No heme or edema Flat, Blunted foveal reflex, mild RPE mottling and clumping, +ERM   Vessels Mild Vascular attenuation, mild Tortuousity Vascular attenuation, mild Tortuousity   Periphery Attached, rare peripheral drusen, No heme, No RT/RD Attached, HST at 0200 with surrounding SRF and pigment clumping / CR scarring -- good laser changes surrounding, scattered RPE changes, No RT/RD          IMAGING AND PROCEDURES  Imaging and Procedures for @TODAY @  OCT, Retina - OU - Both Eyes       Right Eye Quality was good. Central Foveal Thickness: 255. Progression has been stable. Findings include normal foveal contour, no IRF, no SRF.   Left Eye Quality was good. Central Foveal Thickness: 316. Progression has been stable. Findings include abnormal foveal contour, no IRF, no SRF, epiretinal membrane, macular pucker (Nasal ERM and pucker).   Notes *Images captured and stored on drive  Diagnosis / Impression:  NFP, no IRF/SRF OU OS: Nasal ERM and pucker  Clinical management:  See below  Abbreviations: NFP - Normal foveal profile. CME - cystoid macular edema. PED - pigment epithelial detachment. IRF - intraretinal fluid. SRF - subretinal fluid. EZ - ellipsoid zone. ERM - epiretinal membrane. ORA - outer retinal atrophy. ORT - outer retinal tubulation. SRHM - subretinal hyper-reflective material                 ASSESSMENT/PLAN:    ICD-10-CM   1. Retinal tear of left eye  H33.312   2. Left retinal detachment  H33.22   3. Retinal edema  H35.81 OCT, Retina - OU - Both Eyes  4. Essential  hypertension  I10   5. Hypertensive retinopathy of both eyes  H35.033   6. Combined forms of age-related cataract of left eye  H25.812   7. Pseudophakia  Z96.1     1-3. Horseshoe tear with surrounding SRF / focal RD OS  - large HST located at 0200 with +SRF and surrounding pigment clumping/CR scarring  - s/p laser retinopexy OS 03.17.21 -- good laser changes in place              - f/u as scheduled  4,5. Hypertensive retinopathy OU  - discussed importance of tight BP control  - monitor  6. 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  - discussed diagnosis and progression  - under the expert management of Dr. Wyatt Portela  7. Pseudophakia OD  - s/p CE/IOL OD (Dr. Zenia Resides, 04.30.21)  - s/p fragment removal OD 5.4.21  - IOL in excellent position, doing well  - mild pigment debris in [facility] and anterior vit -- no intervention indicated or recommended   - monitor    Ophthalmic Meds Ordered this visit:  No orders of the defined types were placed in this encounter.   This document serves as a record of services personally performed by Gardiner Sleeper, MD, PhD. It was created on their behalf by Ernest Mallick, OA, an ophthalmic assistant. The creation of this record is the provider's dictation and/or activities during the visit.    Electronically signed by: Ernest Mallick, OA 06.07.2021 4:38 PM   Gardiner Sleeper, M.D., Ph.D. Diseases & Surgery of the Retina and [place] 05/31/2020   I have reviewed the above documentation for accuracy and completeness, and I agree with the above. Gardiner Sleeper, M.D., Ph.D. 05/31/20 4:38 PM   Abbreviations: M myopia (nearsighted); A astigmatism; H hyperopia (farsighted); P presbyopia; Mrx spectacle prescription;  CTL contact lenses; OD right eye; OS left eye; OU both eyes  XT exotropia; ET esotropia; PEK punctate epithelial keratitis; PEE punctate epithelial erosions; DES dry eye syndrome; MGD meibomian gland dysfunction; ATs artificial tears; PFAT's preservative free artificial tears; [place] nuclear sclerotic cataract; PSC posterior subcapsular cataract; ERM epi-retinal membrane; PVD posterior vitreous detachment; RD retinal detachment; DM diabetes mellitus; DR diabetic retinopathy; NPDR  non-proliferative diabetic retinopathy; PDR proliferative diabetic retinopathy; CSME clinically significant macular edema; DME diabetic macular edema; dbh dot blot hemorrhages; CWS cotton wool spot; POAG primary open angle glaucoma; C/D cup-to-disc ratio; HVF humphrey visual field; GVF goldmann visual field; OCT optical coherence tomography; IOP intraocular pressure; BRVO Branch retinal vein occlusion; CRVO central retinal vein occlusion; CRAO central retinal artery occlusion; BRAO branch retinal artery occlusion; RT retinal tear; SB scleral buckle; PPV pars plana vitrectomy; VH Vitreous hemorrhage; PRP panretinal laser photocoagulation; IVK intravitreal kenalog; VMT vitreomacular traction; MH Macular hole;  NVD neovascularization of the disc; NVE neovascularization elsewhere; AREDS age related eye disease study; ARMD age related macular degeneration; POAG primary open angle glaucoma; EBMD epithelial/anterior basement membrane dystrophy; ACIOL anterior chamber intraocular lens; IOL intraocular lens; PCIOL posterior chamber intraocular lens; Phaco/IOL phacoemulsification with intraocular lens placement; [place] photorefractive keratectomy; LASIK laser assisted in situ keratomileusis; HTN hypertension; DM diabetes mellitus; COPD chronic obstructive pulmonary disease

## 2020-05-31 ENCOUNTER — Other Ambulatory Visit: Payer: Self-pay

## 2020-05-31 ENCOUNTER — Encounter (INDEPENDENT_AMBULATORY_CARE_PROVIDER_SITE_OTHER): Payer: Self-pay | Admitting: Ophthalmology

## 2020-05-31 ENCOUNTER — Ambulatory Visit (INDEPENDENT_AMBULATORY_CARE_PROVIDER_SITE_OTHER): Payer: Medicare Other | Admitting: Ophthalmology

## 2020-05-31 DIAGNOSIS — H33312 Horseshoe tear of retina without detachment, left eye: Secondary | ICD-10-CM

## 2020-05-31 DIAGNOSIS — I10 Essential (primary) hypertension: Secondary | ICD-10-CM | POA: Diagnosis not present

## 2020-05-31 DIAGNOSIS — H35033 Hypertensive retinopathy, bilateral: Secondary | ICD-10-CM | POA: Diagnosis not present

## 2020-05-31 DIAGNOSIS — H3581 Retinal edema: Secondary | ICD-10-CM

## 2020-05-31 DIAGNOSIS — H3322 Serous retinal detachment, left eye: Secondary | ICD-10-CM

## 2020-05-31 DIAGNOSIS — Z961 Presence of intraocular lens: Secondary | ICD-10-CM

## 2020-05-31 DIAGNOSIS — H25812 Combined forms of age-related cataract, left eye: Secondary | ICD-10-CM

## 2020-06-03 ENCOUNTER — Ambulatory Visit: Payer: Medicare Other | Admitting: Podiatry

## 2020-06-14 ENCOUNTER — Other Ambulatory Visit: Payer: Self-pay

## 2020-06-14 ENCOUNTER — Ambulatory Visit: Payer: Medicare Other | Admitting: Podiatry

## 2020-06-14 ENCOUNTER — Ambulatory Visit (INDEPENDENT_AMBULATORY_CARE_PROVIDER_SITE_OTHER): Payer: Medicare Other

## 2020-06-14 DIAGNOSIS — M79672 Pain in left foot: Secondary | ICD-10-CM

## 2020-06-14 DIAGNOSIS — M779 Enthesopathy, unspecified: Secondary | ICD-10-CM | POA: Diagnosis not present

## 2020-06-14 DIAGNOSIS — M722 Plantar fascial fibromatosis: Secondary | ICD-10-CM | POA: Diagnosis not present

## 2020-06-14 DIAGNOSIS — G8929 Other chronic pain: Secondary | ICD-10-CM | POA: Diagnosis not present

## 2020-06-14 MED ORDER — MELOXICAM 15 MG PO TABS: 15.0000 mg | ORAL_TABLET | Freq: Every day | ORAL | 0 refills | Status: DC

## 2020-06-14 NOTE — Patient Instructions (Signed)
For instructions on how to put on your Plantar Fascial Brace, please visit [email or web address]   Plantar Fasciitis (Heel Spur Syndrome) with Rehab The plantar fascia is a fibrous, ligament-like, soft-tissue structure that spans the bottom of the foot. Plantar fasciitis is a condition that causes pain in the foot due to inflammation of the tissue. SYMPTOMS   Pain and tenderness on the underneath side of the foot.  Pain that worsens with standing or walking. CAUSES  Plantar fasciitis is caused by irritation and injury to the plantar fascia on the underneath side of the foot. Common mechanisms of injury include:  Direct trauma to bottom of the foot.  Damage to a small nerve that runs under the foot where the main fascia attaches to the heel bone.  Stress placed on the plantar fascia due to bone spurs. RISK INCREASES WITH:   Activities that place stress on the plantar fascia (running, jumping, pivoting, or cutting).  Poor strength and flexibility.  Improperly fitted shoes.  Tight calf muscles.  Flat feet.  Failure to warm-up properly before activity.  Obesity. PREVENTION  Warm up and stretch properly before activity.  Allow for adequate recovery between workouts.  Maintain physical fitness:  Strength, flexibility, and endurance.  Cardiovascular fitness.  Maintain a health body weight.  Avoid stress on the plantar fascia.  Wear properly fitted shoes, including arch supports for individuals who have flat feet.  PROGNOSIS  If treated properly, then the symptoms of plantar fasciitis usually resolve without surgery. However, occasionally surgery is necessary.  RELATED COMPLICATIONS   Recurrent symptoms that may result in a chronic condition.  Problems of the lower back that are caused by compensating for the injury, such as limping.  Pain or weakness of the foot during push-off following surgery.  Chronic inflammation, scarring, and partial or complete  fascia tear, occurring more often from repeated injections.  TREATMENT  Treatment initially involves the use of ice and medication to help reduce pain and inflammation. The use of strengthening and stretching exercises may help reduce pain with activity, especially stretches of the Achilles tendon. These exercises may be performed at home or with a therapist. Your caregiver may recommend that you use heel cups of arch supports to help reduce stress on the plantar fascia. Occasionally, corticosteroid injections are given to reduce inflammation. If symptoms persist for greater than 6 months despite non-surgical (conservative), then surgery may be recommended.   MEDICATION   If pain medication is necessary, then nonsteroidal anti-inflammatory medications, such as aspirin and ibuprofen, or other minor pain relievers, such as acetaminophen, are often recommended.  Do not take pain medication within 7 days before surgery.  Prescription pain relievers may be given if deemed necessary by your caregiver. Use only as directed and only as much as you need.  Corticosteroid injections may be given by your caregiver. These injections should be reserved for the most serious cases, because they may only be given a certain number of times.  HEAT AND COLD  Cold treatment (icing) relieves pain and reduces inflammation. Cold treatment should be applied for 10 to 15 minutes every 2 to 3 hours for inflammation and pain and immediately after any activity that aggravates your symptoms. Use ice packs or massage the area with a piece of ice (ice massage).  Heat treatment may be used prior to performing the stretching and strengthening activities prescribed by your caregiver, physical therapist, or athletic trainer. Use a heat pack or soak the injury in warm water.  SEEK IMMEDIATE MEDICAL   CARE IF:  Treatment seems to offer no benefit, or the condition worsens.  Any medications produce adverse side effects.   EXERCISES- RANGE OF MOTION (ROM) AND STRETCHING EXERCISES - Plantar Fasciitis (Heel Spur Syndrome) These exercises may help you when beginning to rehabilitate your injury. Your symptoms may resolve with or without further involvement from your physician, physical therapist or athletic trainer. While completing these exercises, remember:   Restoring tissue flexibility helps normal motion to return to the joints. This allows healthier, less painful movement and activity.  An effective stretch should be held for at least 30 seconds.  A stretch should never be painful. You should only feel a gentle lengthening or release in the stretched tissue.  RANGE OF MOTION - Toe Extension, Flexion  Sit with your right / left leg crossed over your opposite knee.  Grasp your toes and gently pull them back toward the top of your foot. You should feel a stretch on the bottom of your toes and/or foot.  Hold this stretch for 10 seconds.  Now, gently pull your toes toward the bottom of your foot. You should feel a stretch on the top of your toes and or foot.  Hold this stretch for 10 seconds. Repeat  times. Complete this stretch 3 times per day.   RANGE OF MOTION - Ankle Dorsiflexion, Active Assisted  Remove shoes and sit on a chair that is preferably not on a carpeted surface.  Place right / left foot under knee. Extend your opposite leg for support.  Keeping your heel down, slide your right / left foot back toward the chair until you feel a stretch at your ankle or calf. If you do not feel a stretch, slide your bottom forward to the edge of the chair, while still keeping your heel down.  Hold this stretch for 10 seconds. Repeat 3 times. Complete this stretch 2 times per day.   STRETCH  Gastroc, Standing  Place hands on wall.  Extend right / left leg, keeping the front knee somewhat bent.  Slightly point your toes inward on your back foot.  Keeping your right / left heel on the floor and your  knee straight, shift your weight toward the wall, not allowing your back to arch.  You should feel a gentle stretch in the right / left calf. Hold this position for 10 seconds. Repeat 3 times. Complete this stretch 2 times per day.  STRETCH  Soleus, Standing  Place hands on wall.  Extend right / left leg, keeping the other knee somewhat bent.  Slightly point your toes inward on your back foot.  Keep your right / left heel on the floor, bend your back knee, and slightly shift your weight over the back leg so that you feel a gentle stretch deep in your back calf.  Hold this position for 10 seconds. Repeat 3 times. Complete this stretch 2 times per day.  STRETCH  Gastrocsoleus, Standing  Note: This exercise can place a lot of stress on your foot and ankle. Please complete this exercise only if specifically instructed by your caregiver.   Place the ball of your right / left foot on a step, keeping your other foot firmly on the same step.  Hold on to the wall or a rail for balance.  Slowly lift your other foot, allowing your body weight to press your heel down over the edge of the step.  You should feel a stretch in your right / left calf.  Hold this   position for 10 seconds.  Repeat this exercise with a slight bend in your right / left knee. Repeat 3 times. Complete this stretch 2 times per day.   STRENGTHENING EXERCISES - Plantar Fasciitis (Heel Spur Syndrome)  These exercises may help you when beginning to rehabilitate your injury. They may resolve your symptoms with or without further involvement from your physician, physical therapist or athletic trainer. While completing these exercises, remember:   Muscles can gain both the endurance and the strength needed for everyday activities through controlled exercises.  Complete these exercises as instructed by your physician, physical therapist or athletic trainer. Progress the resistance and repetitions only as guided.  STRENGTH -  Towel Curls  Sit in a chair positioned on a non-carpeted surface.  Place your foot on a towel, keeping your heel on the floor.  Pull the towel toward your heel by only curling your toes. Keep your heel on the floor. Repeat 3 times. Complete this exercise 2 times per day.  STRENGTH - Ankle Inversion  Secure one end of a rubber exercise band/tubing to a fixed object (table, pole). Loop the other end around your foot just before your toes.  Place your fists between your knees. This will focus your strengthening at your ankle.  Slowly, pull your big toe up and in, making sure the band/tubing is positioned to resist the entire motion.  Hold this position for 10 seconds.  Have your muscles resist the band/tubing as it slowly pulls your foot back to the starting position. Repeat 3 times. Complete this exercises 2 times per day.  Document Released: 12/11/2005 Document Revised: 03/04/2012 Document Reviewed: 03/25/2009 [employer] Patient Information 2014 [employer]. 

## 2020-06-15 NOTE — Progress Notes (Signed)
Subjective: 79 year old female presents the office with concerns of pain in the bottom of her left heel on the instep.  She describes a sore, tenderness to the area.  Hurts when she first gets up after being on her feet and can be intense at times.  She has tried Voltaren gel which helps some as well as ibuprofen.  She previously has seen Dr. Paulla Dolly for same issue in 2019.  She describes a pulling sensation in the bottom of her heel as well.  No recent injury or trauma.  Denies any weakness or falls.  No radiating pain. Denies any systemic complaints such as fevers, chills, nausea, vomiting. No acute changes since last appointment, and no other complaints at this time.   Objective: AAO x3, NAD DP/PT pulses palpable bilaterally, CRT less than 3 seconds Tenderness to palpation along the plantar medial tubercle of the calcaneus at the insertion of plantar fascia on the left foot. There is no pain along the course of the plantar fascia within the arch of the foot. Plantar fascia appears to be intact. There is no pain with lateral compression of the calcaneus or pain with vibratory sensation. There is no pain along the course or insertion of the achilles tendon. No other areas of tenderness to bilateral lower extremities. Negative tinel sign No open lesions or pre-ulcerative lesions.  No pain with calf compression, swelling, warmth, erythema  Assessment: Left heel pain, plantar fasciitis  Plan: -All treatment options discussed with the patient including all alternatives, risks, complications.  -X-rays obtained reviewed.  There is no evidence of acute fracture or stress fracture identified today.  Posterior calcaneal spurs present. -Steroid injection performed.  See procedure note below -Plantar fascial brace dispensed -Prescribed mobic. Discussed side effects of the medication and directed to stop if any are to occur and call the office.  -Discussed stretching, icing daily.  Discussed shoe modifications  and orthotics. -Patient encouraged to call the office with any questions, concerns, change in symptoms.   Procedure: Injection Tendon/Ligament Discussed alternatives, risks, complications and verbal consent was obtained.  Location: Left plantar fascia at the glabrous junction; medial approach. Skin Prep: Alcohol. Injectate: 0.5cc 0.5% marcaine plain, 0.5 cc 2% lidocaine plain and, 1 cc kenalog 10. Disposition: Patient tolerated procedure well. Injection site dressed with a band-aid.  Post-injection care was discussed and return precautions discussed.   No follow-ups on file.  Trula Slade DPM

## 2020-06-16 ENCOUNTER — Telehealth: Payer: Self-pay | Admitting: Internal Medicine

## 2020-06-16 NOTE — Telephone Encounter (Signed)
Pt thinks she may be having a diverticulitis flare. States she has been having some abd pain in the center of her abdomen for about a week. Offered pt an appt Friday but pt states she will be out of town. Discussed with pt that she should contact her PCP regarding possible flare as we do not have any appts for today or tomorrow. Pt verbalized understanding. Pt also wanted to schedule an appt with Dr. Henrene Pastor states she has been having a clear drainage from her rectum that is sticky. She has been passing a little bit of stool with some mucous but not a "complete bm." Pt scheduled to see Dr.Perry 7/1@3 :20pm. She is aware of appt.

## 2020-06-24 ENCOUNTER — Encounter: Payer: Self-pay | Admitting: Internal Medicine

## 2020-06-24 ENCOUNTER — Ambulatory Visit: Payer: Medicare Other | Admitting: Internal Medicine

## 2020-06-24 VITALS — BP 134/80 | HR 88 | Ht 59.25 in | Wt 157.5 lb

## 2020-06-24 DIAGNOSIS — K5900 Constipation, unspecified: Secondary | ICD-10-CM | POA: Diagnosis not present

## 2020-06-24 DIAGNOSIS — R109 Unspecified abdominal pain: Secondary | ICD-10-CM

## 2020-06-24 DIAGNOSIS — R140 Abdominal distension (gaseous): Secondary | ICD-10-CM | POA: Diagnosis not present

## 2020-06-24 MED ORDER — LINACLOTIDE 145 MCG PO CAPS: 145.0000 ug | ORAL_CAPSULE | Freq: Every day | ORAL | 0 refills | Status: DC

## 2020-06-24 NOTE — Progress Notes (Signed)
HISTORY OF PRESENT ILLNESS:  Gloria Werner is a 79 y.o. female with past medical history as listed below who was evaluated in this office January 28, 2020 regarding abdominal pain, chronic constipation, and weight gain with associated bloating.  She underwent CT scan of the abdomen and pelvis which revealed no acute abnormalities.  Colonoscopy and upper endoscopy were performed.  Colonoscopy revealed severe pandiverticulosis but was otherwise normal.  Upper endoscopy was normal.  It was recommended that she initiat Citrucel.  She was prescribed Bentyl for pain.  Patient reports that overall her abdominal pain has improved.  She does describe some musculoskeletal type pain both on the right and left side in the upper regions.  She also reports 3 episodes of passing clear viscous fluid per rectum without awareness.  Taking MiraLAX once daily.  Constipation has improved though not completely.  She is still disturbed with bloating discomfort.  REVIEW OF SYSTEMS:  All non-GI ROS negative unless otherwise stated in the HPI except for back pain, cough, ankle swelling, heart murmur  Past Medical History:  Diagnosis Date  . Allergic rhinitis due to pollen   . Cataract    NS OU  . Complication of anesthesia   . Cough   . Gastroesophageal reflux disease without esophagitis   . Irritable bowel syndrome   . 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 PONV (postoperative nausea and vomiting)     Past Surgical History:  Procedure Laterality Date  . ABDOMINAL HYSTERECTOMY  1988   secondary to prolapse  . BLADDER SUSPENSION     A-P with Hyst  . INSERTION OF MESH  10/16/2018   Procedure: INSERTION OF MESH;  Surgeon: Ardis Hughs, MD;  Location: [facility];  Service: Urology;;  . NASAL SINUS SURGERY  1970  . ROBOTIC ASSISTED LAPAROSCOPIC SACROCOLPOPEXY N/A 10/16/2018   Procedure: XI ROBOTIC ASSISTED LAPAROSCOPIC SACROCOLPOPEXY;  Surgeon: Ardis Hughs, MD;  Location: [facility];  Service: Urology;   Laterality: N/A;  . TONSILLECTOMY      Social History Gloria Werner  reports that she has never smoked. She has never used smokeless tobacco. She reports that she does not drink alcohol and does not use drugs.  family history includes Asthma in her mother; Breast cancer in her brother; Cancer in her brother and brother; Cancer (age of onset: 76) in her father; Coronary artery disease in her brother; Emphysema in her mother; Lymphoma in her mother.  Allergies  Allergen Reactions  . Ace Inhibitors Cough  . Codeine Nausea And Vomiting  . Lexapro [Escitalopram] Diarrhea and Other (See Comments)    headache       PHYSICAL EXAMINATION: Vital signs: BP 134/80 (BP Location: Left Arm, Patient Position: Sitting, Cuff Size: Normal)   Pulse 88   Ht 4' 11.25" (1.505 m) Comment: height measured without shoes  Wt 157 lb 8 oz (71.4 kg)   LMP 12/25/1986 (Approximate)   BMI 31.54 kg/m   Constitutional: generally well-appearing, no acute distress Psychiatric: alert and oriented x3, cooperative Eyes: extraocular movements intact, anicteric, conjunctiva pink Mouth: oral pharynx moist, no lesions Neck: supple no lymphadenopathy Cardiovascular: heart regular rate and rhythm. Lungs: clear to auscultation bilaterally Abdomen: soft, nontender, nondistended, no obvious ascites, no peritoneal signs, normal bowel sounds, no organomegaly Rectal: Omitted Extremities: no clubbing, cyanosis, or lower extremity edema bilaterally Skin: no lesions on visible extremities Neuro: No focal deficits.  Cranial nerves intact  ASSESSMENT:  1.  Constipation with associated abdominal bloating discomfort 2.  Recent colonoscopy  with diverticulosis 3.  Recent upper endoscopy normal 4.  Recent CT scan negative 5.  Overweight 6.  Fecal leakage mucoid material.  Benign  PLAN:  1.  Continue Citrucel 2.  Trial of Linzess 145 mcg daily.  2 weeks of samples provided.  If this is helpful, she will contact the  office and we can prescribe this for her to use on a regular basis 3.  Exercise and weight loss 4.  Reassurance regarding anal leakage 5.  Routine follow-up as needed

## 2020-06-24 NOTE — Progress Notes (Signed)
constipati

## 2020-06-24 NOTE — Patient Instructions (Signed)
You have been given some samples of Linzess 145 mcg - take one 30 minutes before your first meal of the day - if this is helpful, call us back and I will send in a prescription

## 2020-06-30 DIAGNOSIS — H2512 Age-related nuclear cataract, left eye: Secondary | ICD-10-CM | POA: Diagnosis not present

## 2020-07-02 DIAGNOSIS — H25812 Combined forms of age-related cataract, left eye: Secondary | ICD-10-CM | POA: Diagnosis not present

## 2020-07-05 ENCOUNTER — Ambulatory Visit: Payer: Medicare Other | Admitting: Podiatry

## 2020-07-07 ENCOUNTER — Telehealth: Payer: Self-pay | Admitting: Internal Medicine

## 2020-07-07 MED ORDER — LINACLOTIDE 145 MCG PO CAPS: 145.0000 ug | ORAL_CAPSULE | Freq: Every day | ORAL | 3 refills | Status: DC

## 2020-07-07 NOTE — Telephone Encounter (Signed)
Pt is requesting a call back regarding a medication to be prescribed for her (Linzess)

## 2020-07-07 NOTE — Telephone Encounter (Signed)
Patient states that the samples of Linzess she was given have been working well.  I sent in a prescription to her pharmacy.

## 2020-07-12 ENCOUNTER — Encounter (INDEPENDENT_AMBULATORY_CARE_PROVIDER_SITE_OTHER): Payer: Medicare Other | Admitting: Ophthalmology

## 2020-07-30 DIAGNOSIS — D225 Melanocytic nevi of trunk: Secondary | ICD-10-CM | POA: Diagnosis not present

## 2020-07-30 DIAGNOSIS — B078 Other viral warts: Secondary | ICD-10-CM | POA: Diagnosis not present

## 2020-07-30 DIAGNOSIS — L720 Epidermal cyst: Secondary | ICD-10-CM | POA: Diagnosis not present

## 2020-07-30 DIAGNOSIS — Z85828 Personal history of other malignant neoplasm of skin: Secondary | ICD-10-CM | POA: Diagnosis not present

## 2020-07-30 DIAGNOSIS — Z08 Encounter for follow-up examination after completed treatment for malignant neoplasm: Secondary | ICD-10-CM | POA: Diagnosis not present

## 2020-08-02 ENCOUNTER — Other Ambulatory Visit: Payer: Self-pay

## 2020-08-03 ENCOUNTER — Encounter: Payer: Self-pay | Admitting: Family Medicine

## 2020-08-03 ENCOUNTER — Ambulatory Visit (INDEPENDENT_AMBULATORY_CARE_PROVIDER_SITE_OTHER): Payer: Medicare Other | Admitting: Family Medicine

## 2020-08-03 VITALS — BP 126/78 | HR 76 | Temp 97.8°F | Ht 59.0 in | Wt 155.0 lb

## 2020-08-03 DIAGNOSIS — I700 Atherosclerosis of aorta: Secondary | ICD-10-CM

## 2020-08-03 DIAGNOSIS — I10 Essential (primary) hypertension: Secondary | ICD-10-CM

## 2020-08-03 DIAGNOSIS — E559 Vitamin D deficiency, unspecified: Secondary | ICD-10-CM | POA: Diagnosis not present

## 2020-08-03 DIAGNOSIS — T887XXA Unspecified adverse effect of drug or medicament, initial encounter: Secondary | ICD-10-CM

## 2020-08-03 LAB — LIPID PANEL
Cholesterol: 162 mg/dL (ref 0–200)
HDL: 58 mg/dL (ref >39.00)
LDL Cholesterol: 84 mg/dL (ref 0–99)
NonHDL: 103.93
Total CHOL/HDL Ratio: 3
Triglycerides: 101 mg/dL (ref 0.0–149.0)
VLDL: 20.2 mg/dL (ref 0.0–40.0)

## 2020-08-03 LAB — LDL CHOLESTEROL, DIRECT: Direct LDL: 84 mg/dL

## 2020-08-03 LAB — CBC
HCT: 40.9 % (ref 36.0–46.0)
Hemoglobin: 13.7 g/dL (ref 12.0–15.0)
MCHC: 33.5 g/dL (ref 30.0–36.0)
MCV: 90.2 fl (ref 78.0–100.0)
Platelets: 182 10*3/uL (ref 150.0–400.0)
RBC: 4.54 Mil/uL (ref 3.87–5.11)
RDW: 13.6 % (ref 11.5–15.5)
WBC: 5.5 10*3/uL (ref 4.0–10.5)

## 2020-08-03 LAB — COMPREHENSIVE METABOLIC PANEL
ALT: 16 U/L (ref 0–35)
AST: 21 U/L (ref 0–37)
Albumin: 4.4 g/dL (ref 3.5–5.2)
Alkaline Phosphatase: 54 U/L (ref 39–117)
BUN: 13 mg/dL (ref 6–23)
CO2: 26 mEq/L (ref 19–32)
Calcium: 9.5 mg/dL (ref 8.4–10.5)
Chloride: 104 mEq/L (ref 96–112)
Creatinine, Ser: 0.86 mg/dL (ref 0.40–1.20)
GFR: 64.04 mL/min (ref >60.00)
Glucose, Bld: 92 mg/dL (ref 70–99)
Potassium: 4.1 mEq/L (ref 3.5–5.1)
Sodium: 140 mEq/L (ref 135–145)
Total Bilirubin: 0.5 mg/dL (ref 0.2–1.2)
Total Protein: 7 g/dL (ref 6.0–8.3)

## 2020-08-03 LAB — VITAMIN D 25 HYDROXY (VIT D DEFICIENCY, FRACTURES): VITD: 33.36 ng/mL (ref 30.00–100.00)

## 2020-08-03 NOTE — Patient Instructions (Signed)
Preventing High Cholesterol Cholesterol is a white, waxy substance similar to fat that the human body needs to help build cells. The liver makes all the cholesterol that a person's body needs. Having high cholesterol (hypercholesterolemia) increases a person's risk for heart disease and stroke. Extra (excess) cholesterol comes from the food the person eats. High cholesterol can often be prevented with diet and lifestyle changes. If you already have high cholesterol, you can control it with diet and lifestyle changes and with medicine. How can high cholesterol affect me? If you have high cholesterol, deposits (plaques) may build up on the walls of your arteries. The arteries are the blood vessels that carry blood away from your heart. Plaques make the arteries narrower and stiffer. This can limit or block blood flow and cause blood clots to form. Blood clots:  Are tiny balls of cells that form in your blood.  Can move to the heart or brain, causing a heart attack or stroke. Plaques in arteries greatly increase your risk for heart attack and stroke.Making diet and lifestyle changes can reduce your risk for these conditions that may threaten your life. What can increase my risk? This condition is more likely to develop in people who:  Eat foods that are high in saturated fat or cholesterol. Saturated fat is mostly found in: ? Foods that contain animal fat, such as red meat and some dairy products. ? Certain fatty foods made from plants, such as tropical oils.  Are overweight.  Are not getting enough exercise.  Have a family history of high cholesterol. What actions can I take to prevent this? Nutrition   Eat less saturated fat.  Avoid trans fats (partially hydrogenated oils). These are often found in margarine and in some baked goods, fried foods, and snacks bought in packages.  Avoid precooked or cured meat, such as sausages or meat loaves.  Avoid foods and drinks that have added  sugars.  Eat more fruits, vegetables, and whole grains.  Choose healthy sources of protein, such as fish, poultry, lean cuts of red meat, beans, peas, lentils, and nuts.  Choose healthy sources of fat, such as: ? Nuts. ? Vegetable oils, especially olive oil. ? Fish that have healthy fats (omega-3 fatty acids), such as mackerel or salmon. The items listed above may not be a complete list of recommended foods and beverages. Contact a dietitian for more information. Lifestyle  Lose weight if you are overweight. Losing 5-10 lb (2.3-4.5 kg) can help prevent or control high cholesterol. It can also lower your risk for diabetes and high blood pressure. Ask your health care provider to help you with a diet and exercise plan to lose weight safely.  Do not use any products that contain nicotine or tobacco, such as cigarettes, e-cigarettes, and chewing tobacco. If you need help quitting, ask your health care provider.  Limit your alcohol intake. ? Do not drink alcohol if:  Your health care provider tells you not to drink.  You are pregnant, may be pregnant, or are planning to become pregnant. ? If you drink alcohol:  Limit how much you use to:  0-1 drink a day for women.  0-2 drinks a day for men.  Be aware of how much alcohol is in your drink. In the [place] one drink equals one 12 oz bottle of beer (355 mL), one 5 oz glass of wine (148 mL), or one 1 oz glass of hard liquor (44 mL). Activity   Get enough exercise. Each week, do at   least 150 minutes of exercise that takes a medium level of effort (moderate-intensity exercise). ? This is exercise that:  Makes your heart beat faster and makes you breathe harder than usual.  Allows you to still be able to talk. ? You could exercise in short sessions several times a day or longer sessions a few times a week. For example, on 5 days each week, you could walk fast or ride your bike 3 times a day for 10 minutes each time.  Do exercises as told  by your health care provider. Medicines  In addition to diet and lifestyle changes, your health care provider may recommend medicines to help lower cholesterol. This may be a medicine to lower the amount of cholesterol your liver makes. You may need medicine if: ? Diet and lifestyle changes do not lower your cholesterol enough. ? You have high cholesterol and other risk factors for heart disease or stroke.  Take over-the-counter and prescription medicines only as told by your health care provider. General information  Manage your risk factors for high cholesterol. Talk with your health care provider about all your risk factors and how to lower your risk.  Manage other conditions that you have, such as diabetes or high blood pressure (hypertension).  Have blood tests to check your cholesterol levels at regular points in time as told by your health care provider.  Keep all follow-up visits as told by your health care provider. This is important. Where to find more information  American Heart Association: www.heart.org  National Heart, Lung, and Blood Institute: www.nhlbi.nih.gov Summary  High cholesterol increases your risk for heart disease and stroke. By keeping your cholesterol level low, you can reduce your risk for these conditions.  High cholesterol can often be prevented with diet and lifestyle changes.  Work with your health care provider to manage your risk factors, and have your blood tested regularly. This information is not intended to replace advice given to you by your health care provider. Make sure you discuss any questions you have with your health care provider. Document Revised: 04/04/2019 Document Reviewed: 08/19/2016 Elsevier Patient Education  2020 Elsevier Inc.  

## 2020-08-03 NOTE — Progress Notes (Signed)
Established Patient Office Visit  Subjective:  Patient ID: Gloria Werner, female    DOB: Nov 05, 1941  Age: 79 y.o. MRN: [id number]  CC:  Chief Complaint  Patient presents with  . Transitions Of Care    TOC from Dr. Deborra Medina, patient would like cholesterol checked patient fasting.     HPI Gloria Werner presents for follow-up of her hypertension, aortic calcifications and elevated cholesterol and vitamin D deficiency.  Blood pressures well controlled with amlodipine.  While at her cholesterol was not that elevated she was started on low-dose Crestor due to calcification seen in her aorta on an unrelated study.  She has been having joint pains.  She wonders if they could be a medication side effect.  She denies myalgias.  Past Medical History:  Diagnosis Date  . Allergic rhinitis due to pollen   . Cataract    NS OU  . Complication of anesthesia   . Cough   . Gastroesophageal reflux disease without esophagitis   . Irritable bowel syndrome   . 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 PONV (postoperative nausea and vomiting)     Past Surgical History:  Procedure Laterality Date  . ABDOMINAL HYSTERECTOMY  1988   secondary to prolapse  . BLADDER SUSPENSION     A-P with Hyst  . INSERTION OF MESH  10/16/2018   Procedure: INSERTION OF MESH;  Surgeon: Ardis Hughs, MD;  Location: [facility];  Service: Urology;;  . NASAL SINUS SURGERY  1970  . ROBOTIC ASSISTED LAPAROSCOPIC SACROCOLPOPEXY N/A 10/16/2018   Procedure: XI ROBOTIC ASSISTED LAPAROSCOPIC SACROCOLPOPEXY;  Surgeon: Ardis Hughs, MD;  Location: [facility];  Service: Urology;  Laterality: N/A;  . TONSILLECTOMY      Family History  Problem Relation Age of Onset  . Emphysema Mother   . Lymphoma Mother   . Asthma Mother   . Cancer Father 50       lung cancer  . Coronary artery disease Brother   . Cancer Brother        breast cancer  . Breast cancer Brother   . Cancer Brother        lung cancer  . Diabetes Neg Hx    . Hypertension Neg Hx   . Colon cancer Neg Hx   . Esophageal cancer Neg Hx   . Rectal cancer Neg Hx   . Stomach cancer Neg Hx     Social History   Socioeconomic History  . Marital status: Married    Spouse name: Not on file  . Number of children: 2  . Years of education: 23  . Highest education level: Not on file  Occupational History  . Occupation: Environmental health practitioner  Tobacco Use  . Smoking status: Never Smoker  . Smokeless tobacco: Never Used  Vaping Use  . Vaping Use: Never used  Substance and Sexual Activity  . Alcohol use: No  . Drug use: No  . Sexual activity: Yes    Partners: Male    Birth control/protection: Surgical  Other Topics Concern  . Not on file  Social History Narrative   HSG. Married '63. 1 son- 40'; 1-daughter-'68; grandchildren 3. 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 Marriage in good health. 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 Provided lead to [employer].   Social Determinants of Health   Financial Resource Strain:   . Difficulty of Paying Living Expenses:   Food Insecurity:   .  Worried About Charity fundraiser in the Last Year:   . Arboriculturist in the Last Year:   Transportation Needs:   . Film/video editor (Medical):   Marland Kitchen Lack of Transportation (Non-Medical):   Physical Activity:   . Days of Exercise per Week:   . Minutes of Exercise per Session:   Stress:   . Feeling of Stress :   Social Connections:   . Frequency of Communication with Friends and Family:   . Frequency of Social Gatherings with Friends and Family:   . Attends Religious Services:   . Active Member of Clubs or Organizations:   . Attends Archivist Meetings:   Marland Kitchen Marital Status:   Intimate Partner Violence:   . Fear of Current or Ex-Partner:   . Emotionally Abused:   Marland Kitchen Physically Abused:   . Sexually Abused:     Outpatient Medications Prior to Visit  Medication  Sig Dispense Refill  . amLODipine (NORVASC) 5 MG tablet Take 1 tablet (5 mg total) by mouth daily. 90 tablet 3  . azelastine (ASTELIN) 0.1 % nasal spray Place 1 spray into both nostrils 2 (two) times daily as needed for rhinitis or allergies. Use in each nostril as directed 30 mL 11  . buPROPion (WELLBUTRIN) 75 MG tablet Take 1 tablet (75 mg total) by mouth 2 (two) times daily. 180 tablet 3  . calcium carbonate (OSCAL) 1500 (600 Ca) MG TABS tablet Take by mouth daily with breakfast.    . Carboxymethylcellul-Glycerin (LUBRICATING EYE DROPS OP) Place 1 drop into both eyes daily as needed (dry eyes).    Marland Kitchen ipratropium (ATROVENT) 0.03 % nasal spray Place 2 sprays into both nostrils every 12 (twelve) hours. 30 mL 11  . linaclotide (LINZESS) 145 MCG CAPS capsule Take 1 capsule (145 mcg total) by mouth daily before breakfast. 90 capsule 3  . Multiple Vitamin (MULTIVITAMIN) tablet Take 1 tablet by mouth daily.    . rosuvastatin (CRESTOR) 5 MG tablet Take 1 tablet (5 mg total) by mouth at bedtime. 90 tablet 3  . saccharomyces boulardii (FLORASTOR) 250 MG capsule Take 2 capsules (500 mg total) by mouth 2 (two) times daily. 120 capsule 3  . triamcinolone (NASACORT) 55 MCG/ACT AERO nasal inhaler Place 2 sprays into the nose daily.    . Vitamin D, Ergocalciferol, (DRISDOL) 1.25 MG (50000 UT) CAPS capsule TAKE 1 CAPSULE BY MOUTH ONCE EVERY MONTH (EVERY  30  (THIRTY)  DAYS) 3 capsule 3  . meloxicam (MOBIC) 15 MG tablet Take 1 tablet (15 mg total) by mouth daily. (Patient not taking: Reported on 08/03/2020) 30 tablet 0   No facility-administered medications prior to visit.    Allergies  Allergen Reactions  . Ace Inhibitors Cough  . Codeine Nausea And Vomiting  . Lexapro [Escitalopram] Diarrhea and Other (See Comments)    headache    ROS Review of Systems  Constitutional: Negative.   HENT: Negative.   Respiratory: Negative.   Cardiovascular: Negative.   Gastrointestinal: Negative.   Endocrine: Negative  for polyphagia and polyuria.  Genitourinary: Negative.   Musculoskeletal: Positive for arthralgias. Negative for myalgias.  Neurological: Negative.  Negative for speech difficulty and headaches.  Hematological: Does not bruise/bleed easily.  Psychiatric/Behavioral: Negative.       Objective:    Physical Exam Vitals and nursing note reviewed.  Constitutional:      General: She is not in acute distress.    Appearance: Normal appearance. She is not ill-appearing, toxic-appearing or diaphoretic.  HENT:     Head: Normocephalic and atraumatic.     Right Ear: External ear normal.     Left Ear: External ear normal.     Mouth/Throat:     Mouth: Mucous membranes are moist.     Pharynx: Oropharynx is clear. No oropharyngeal exudate or posterior oropharyngeal erythema.  Eyes:     General: No scleral icterus.       Right eye: No discharge.        Left eye: No discharge.  Cardiovascular:     Rate and Rhythm: Normal rate and regular rhythm.     Pulses:          Carotid pulses are 2+ on the right side and 2+ on the left side.      Radial pulses are 2+ on the right side and 2+ on the left side.       Posterior tibial pulses are 1+ on the right side and 1+ on the left side.  Pulmonary:     Effort: Pulmonary effort is normal.     Breath sounds: Normal breath sounds.  Abdominal:     General: Bowel sounds are normal.  Musculoskeletal:     Cervical back: No rigidity or tenderness.     Right lower leg: No edema.     Left lower leg: No edema.  Lymphadenopathy:     Cervical: No cervical adenopathy.  Skin:    General: Skin is warm and dry.  Neurological:     Mental Status: She is alert and oriented to person, place, and time.  Psychiatric:        Mood and Affect: Mood normal.        Behavior: Behavior normal.     BP 126/78   Pulse 76   Temp 97.8 F (36.6 C) (Tympanic)   Ht 4\' 11"  (1.499 m)   Wt 155 lb (70.3 kg)   LMP 12/25/1986 (Approximate)   SpO2 99%   BMI 31.31 kg/m  Wt  Readings from Last 3 Encounters:  08/03/20 155 lb (70.3 kg)  06/24/20 157 lb 8 oz (71.4 kg)  02/18/20 158 lb (71.7 kg)     Health Maintenance Due  Topic Date Due  . INFLUENZA VACCINE  07/25/2020    There are no preventive care reminders to display for this patient.  Lab Results  Component Value Date   TSH 1.64 09/29/2019   Lab Results  Component Value Date   WBC 4.2 09/29/2019   HGB 13.7 09/29/2019   HCT 41.3 09/29/2019   MCV 90.0 09/29/2019   PLT 197.0 09/29/2019   Lab Results  Component Value Date   NA 139 12/29/2019   K 4.1 12/29/2019   CO2 26 12/29/2019   GLUCOSE 105 (H) 12/29/2019   BUN 16 12/29/2019   CREATININE 0.78 12/29/2019   BILITOT 0.4 12/29/2019   ALKPHOS 53 12/29/2019   AST 27 12/29/2019   ALT 21 12/29/2019   PROT 7.3 12/29/2019   ALBUMIN 4.5 12/29/2019   CALCIUM 9.5 12/29/2019   ANIONGAP 7 10/17/2018   GFR 71.79 12/29/2019   Lab Results  Component Value Date   CHOL 143 12/29/2019   Lab Results  Component Value Date   HDL 56.00 12/29/2019   Lab Results  Component Value Date   LDLCALC 72 12/29/2019   Lab Results  Component Value Date   TRIG 74.0 12/29/2019   Lab Results  Component Value Date   CHOLHDL 3 12/29/2019   No results found for: HGBA1C  Assessment & Plan:   Problem List Items Addressed This Visit      Cardiovascular and Mediastinum   Essential hypertension   Relevant Orders   CBC   Comprehensive metabolic panel   Aortic atherosclerosis ([place]) - Primary   Relevant Orders   Comprehensive metabolic panel   LDL cholesterol, direct   Lipid panel     Other   Vitamin D deficiency   Relevant Orders   VITAMIN D 25 Hydroxy (Vit-D Deficiency, Fractures)   Medication side effect      No orders of the defined types were placed in this encounter.   Follow-up: Return in about 3 months (around 11/03/2020).  Gave permission for patient to hold her statin for 3 weeks and see if it makes a difference in her joint aches  and pains.  Related to her that many of my patients find out that it does not go ahead and restart the statin.  She will let me know otherwise.  Continue Norvasc for hypertension.  We will adjust high-dose vitamin D pending results.  Libby Maw, MD

## 2020-08-11 ENCOUNTER — Telehealth: Payer: Self-pay | Admitting: Obstetrics & Gynecology

## 2020-08-11 NOTE — Telephone Encounter (Signed)
AEX 05/2019 H/O lap sacrocolpopexy 09/2018 PMP, no HRT Small rectocele  Spoke with pt. Pt reports having vaginal discharge, lower back pain, urine odor, some intermittent abd bloating x 1 week. Pt denies any vaginal itching, odor, fever, chills or vaginal bleeding at this time.  Pt states wears incontinence pad now due to worsening urinary incontinence. Pt unsure if has UTI or yeast infection. Pt also reports having lower leg swelling and intermittent joint pain.  Advised pt to have OV for further evaluation. Pt aware Dr Sabra Heck not in office today. Declines appt with another provider today. Pt agreeable. Pt scheduled with Dr Sabra Heck on 08/12/20 at 1 pm. Pt agreeable and verbalized understanding of date and time of appt.  Encounter closed.

## 2020-08-11 NOTE — Telephone Encounter (Signed)
Patient has been online and thinks she may have a yeast infection. She wants to discuss this with Dr. Sabra Heck first. She also wants to discuss having blood work done.

## 2020-08-12 ENCOUNTER — Other Ambulatory Visit: Payer: Self-pay

## 2020-08-12 ENCOUNTER — Encounter: Payer: Self-pay | Admitting: Obstetrics & Gynecology

## 2020-08-12 ENCOUNTER — Ambulatory Visit: Payer: Medicare Other | Admitting: Obstetrics & Gynecology

## 2020-08-12 VITALS — BP 110/78 | HR 68 | Resp 16 | Wt 157.0 lb

## 2020-08-12 DIAGNOSIS — R8290 Unspecified abnormal findings in urine: Secondary | ICD-10-CM

## 2020-08-12 DIAGNOSIS — N898 Other specified noninflammatory disorders of vagina: Secondary | ICD-10-CM

## 2020-08-12 LAB — POCT URINALYSIS DIPSTICK
Bilirubin, UA: NEGATIVE
Blood, UA: NEGATIVE
Glucose, UA: NEGATIVE
Ketones, UA: NEGATIVE
Leukocytes, UA: NEGATIVE
Nitrite, UA: NEGATIVE
Protein, UA: NEGATIVE
Urobilinogen, UA: NEGATIVE E.U./dL — AB
pH, UA: 5 (ref 5.0–8.0)

## 2020-08-12 NOTE — Progress Notes (Signed)
GYNECOLOGY  VISIT  CC:   Urine odor and possible discharge  HPI: 79 y.o. [id number] Married White or Caucasian female here for complaint of changing in her urine odor.  This is much stronger in the morning and it is darker in the morning.  She has noticed a little white, cheezy looking discharge when she takes off her bathing suit when she gets in and out of her hot tub that is on her patio.  Denies dysuria.  Denies vaginal bleeding or blood in her urine.  She is having a little lower back pain.    She is having some having some other body pains/aches.  She started lipid lower agent this past year so discussed with her PCP possibly stopping it to see if this was the cause.  She has been off for a few days and she thinks it is a little better.  Wants to know my suggestions if this does not get much better.    poct urine-neg  GYNECOLOGIC HISTORY: Patient's last menstrual period was 12/25/1986 (approximate). Contraception: hysterectomy Menopausal hormone therapy: none  Patient Active Problem List   Diagnosis Date Noted  . Medication side effect 08/03/2020  . DDD (degenerative disc disease), lumbar 09/28/2019  . Aortic atherosclerosis ([place]) 09/28/2019  . Rib pain 09/16/2019  . Right lumbar pain 09/16/2019  . OSA (obstructive sleep apnea) 01/27/2019  . Pulmonary nodule 01/27/2019  . Allergic rhinitis 11/06/2018  . Cough 09/24/2018  . GERD (gastroesophageal reflux disease) 09/04/2018  . De Quervain's syndrome (tenosynovitis) 07/03/2018  . Essential hypertension 06/03/2018  . Seasonal affective disorder ([place]) 01/17/2017  . Vitamin D deficiency 01/17/2017  . Lichen simplex chronicus 04/08/2015  . Osteopenia 04/08/2015  . Depression 03/05/2015  . ALLERGIC RHINITIS, SEASONAL 02/03/2008  . IRRITABLE BOWEL SYNDROME 02/03/2008    Past Medical History:  Diagnosis Date  . Allergic rhinitis due to pollen   . Cataract    NS OU  . Complication of anesthesia   . Cough   . Gastroesophageal  reflux disease without esophagitis   . Irritable bowel syndrome   . 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 PONV (postoperative nausea and vomiting)     Past Surgical History:  Procedure Laterality Date  . ABDOMINAL HYSTERECTOMY  1988   secondary to prolapse  . BLADDER SUSPENSION     A-P with Hyst  . INSERTION OF MESH  10/16/2018   Procedure: INSERTION OF MESH;  Surgeon: Ardis Hughs, MD;  Location: [facility];  Service: Urology;;  . NASAL SINUS SURGERY  1970  . ROBOTIC ASSISTED LAPAROSCOPIC SACROCOLPOPEXY N/A 10/16/2018   Procedure: XI ROBOTIC ASSISTED LAPAROSCOPIC SACROCOLPOPEXY;  Surgeon: Ardis Hughs, MD;  Location: [facility];  Service: Urology;  Laterality: N/A;  . TONSILLECTOMY      MEDS:   Current Outpatient Medications on File Prior to Visit  Medication Sig Dispense Refill  . amLODipine (NORVASC) 5 MG tablet Take 1 tablet (5 mg total) by mouth daily. 90 tablet 3  . azelastine (ASTELIN) 0.1 % nasal spray Place 1 spray into both nostrils 2 (two) times daily as needed for rhinitis or allergies. Use in each nostril as directed 30 mL 11  . buPROPion (WELLBUTRIN) 75 MG tablet Take 1 tablet (75 mg total) by mouth 2 (two) times daily. 180 tablet 3  . calcium carbonate (OSCAL) 1500 (600 Ca) MG TABS tablet Take by mouth daily with breakfast.    . Carboxymethylcellul-Glycerin (LUBRICATING EYE DROPS OP) Place 1 drop into both eyes daily as needed (  dry eyes).    Marland Kitchen ipratropium (ATROVENT) 0.03 % nasal spray Place 2 sprays into both nostrils every 12 (twelve) hours. 30 mL 11  . linaclotide (LINZESS) 145 MCG CAPS capsule Take 1 capsule (145 mcg total) by mouth daily before breakfast. 90 capsule 3  . Multiple Vitamin (MULTIVITAMIN) tablet Take 1 tablet by mouth daily.    Marland Kitchen saccharomyces boulardii (FLORASTOR) 250 MG capsule Take 2 capsules (500 mg total) by mouth 2 (two) times daily. 120 capsule 3  . triamcinolone (NASACORT) 55 MCG/ACT AERO nasal inhaler Place 2 sprays into the nose daily.     . Vitamin D, Ergocalciferol, (DRISDOL) 1.25 MG (50000 UT) CAPS capsule TAKE 1 CAPSULE BY MOUTH ONCE EVERY MONTH (EVERY  30  (THIRTY)  DAYS) 3 capsule 3   No current facility-administered medications on file prior to visit.    ALLERGIES: Ace inhibitors, Codeine, and Lexapro [escitalopram]  Family History  Problem Relation Age of Onset  . Emphysema Mother   . Lymphoma Mother   . Asthma Mother   . Cancer Father 51       lung cancer  . Coronary artery disease Brother   . Cancer Brother        breast cancer  . Breast cancer Brother   . Cancer Brother        lung cancer  . Diabetes Neg Hx   . Hypertension Neg Hx   . Colon cancer Neg Hx   . Esophageal cancer Neg Hx   . Rectal cancer Neg Hx   . Stomach cancer Neg Hx     SH:  Married, non smoker  Review of Systems  HENT: Negative.   Eyes: Negative.   Respiratory: Negative.   Cardiovascular: Negative.   Gastrointestinal: Negative.   Endocrine: Negative.   Genitourinary: Negative.        Vaginal discharge & urine odor  Musculoskeletal: Negative.   Skin: Negative.   Allergic/Immunologic: Negative.   Neurological: Negative.   Hematological: Negative.   Psychiatric/Behavioral: Negative.     PHYSICAL EXAMINATION:    BP 110/78   Pulse 68   Resp 16   Wt 157 lb (71.2 kg)   LMP 12/25/1986 (Approximate)   BMI 31.71 kg/m     General appearance: alert, cooperative and appears stated age Flank:  No CVA tenderness Abdomen: soft, non-tender; bowel sounds normal; no masses,  no organomegaly Lymph:  no inguinal LAD noted  Pelvic: External genitalia:  no lesions              Urethra:  normal appearing urethra with no masses, tenderness or lesions              Bartholins and Skenes: normal                 Vagina: normal appearing vagina with normal color and discharge, no lesions, 3rd degree rectocele              Cervix: no lesions              Bimanual Exam:  Uterus:  normal size, contour, position, consistency, mobility,  non-tender, constipated stool noted on exam              Adnexa: no mass, fullness, tenderness  Chaperone, Olene Floss, CMA, was present for exam.  Assessment: Vaginal discharge Increased odor of urine in the morning Joint pain, possibly related to cholesterol medication Constipation, on Linzess and miralax  Plan: Urine culture pending Affirm pending

## 2020-08-14 LAB — VAGINITIS/VAGINOSIS, DNA PROBE
Candida Species: NEGATIVE
Gardnerella vaginalis: NEGATIVE
Trichomonas vaginosis: NEGATIVE

## 2020-08-14 LAB — URINE CULTURE

## 2020-09-13 NOTE — Progress Notes (Signed)
Subjective:   Gloria Werner is a 79 y.o. female who presents for Medicare Annual (Subsequent) preventive examination.  I connected with [name] today by telephone and verified that I am speaking with the correct person using two identifiers. Location patient: home Location provider: work Persons participating in the virtual visit: patient, Marine scientist.    I discussed the limitations, risks, security and privacy concerns of performing an evaluation and management service by telephone and the availability of in person appointments. I also discussed with the patient that there may be a patient responsible charge related to this service. The patient expressed understanding and verbally consented to this telephonic visit.    Interactive audio and video telecommunications were attempted between this provider and patient, however failed, due to patient having technical difficulties OR patient did not have access to video capability.  We continued and completed visit with audio only.  Some vital signs may be absent or patient reported.   Time Spent with patient on telephone encounter: 25 minutes  Review of Systems     Cardiac Risk Factors include: advanced age (>28men, >109 women);hypertension;obesity (BMI >30kg/m2)     Objective:    Today's Vitals   09/14/20 0813  Weight: 153 lb (69.4 kg)  Height: 4\' 11"  (1.499 m)   Body mass index is 30.9 kg/m.  Advanced Directives 09/14/2020 09/10/2019 01/11/2019 10/16/2018 10/14/2018  Does Patient Have a Medical Advance Directive? Yes Yes Yes Yes Yes  Type of Paramedic of [place];Living will [place];Living will [place];Living will [place];Living will [place];Living will  Does patient want to make changes to medical advance directive? - No - Patient declined No - Patient declined No - Patient declined No - Patient declined  Copy of [place] in Chart? No - copy requested No - copy requested No - copy requested No - copy requested No - copy requested    Current Medications (verified) Outpatient Encounter Medications as of 09/14/2020  Medication Sig  . amLODipine (NORVASC) 5 MG tablet Take 1 tablet (5 mg total) by mouth daily.  Marland Kitchen azelastine (ASTELIN) 0.1 % nasal spray Place 1 spray into both nostrils 2 (two) times daily as needed for rhinitis or allergies. Use in each nostril as directed  . buPROPion (WELLBUTRIN) 75 MG tablet Take 1 tablet (75 mg total) by mouth 2 (two) times daily.  . calcium carbonate (OSCAL) 1500 (600 Ca) MG TABS tablet Take by mouth daily with breakfast.  . Carboxymethylcellul-Glycerin (LUBRICATING EYE DROPS OP) Place 1 drop into both eyes daily as needed (dry eyes).  Marland Kitchen ipratropium (ATROVENT) 0.03 % nasal spray Place 2 sprays into both nostrils every 12 (twelve) hours.  . Multiple Vitamin (MULTIVITAMIN) tablet Take 1 tablet by mouth daily.  Marland Kitchen saccharomyces boulardii (FLORASTOR) 250 MG capsule Take 2 capsules (500 mg total) by mouth 2 (two) times daily.  Marland Kitchen triamcinolone (NASACORT) 55 MCG/ACT AERO nasal inhaler Place 2 sprays into the nose daily.  . Vitamin D, Ergocalciferol, (DRISDOL) 1.25 MG (50000 UT) CAPS capsule TAKE 1 CAPSULE BY MOUTH ONCE EVERY MONTH (EVERY  30  (THIRTY)  DAYS)  . linaclotide (LINZESS) 145 MCG CAPS capsule Take 1 capsule (145 mcg total) by mouth daily before breakfast. (Patient not taking: Reported on 09/14/2020)   No facility-administered encounter medications on file as of 09/14/2020.    Allergies (verified) Ace inhibitors, Codeine, and Lexapro [escitalopram]   History: Past Medical History:  Diagnosis Date  .  Allergic rhinitis due to pollen   . Cataract    NS OU  . Complication of anesthesia   . Cough   . Gastroesophageal reflux disease without esophagitis   . Irritable bowel syndrome   . 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 PONV (postoperative nausea and  vomiting)    Past Surgical History:  Procedure Laterality Date  . ABDOMINAL HYSTERECTOMY  1988   secondary to prolapse  . BLADDER SUSPENSION     A-P with Hyst  . INSERTION OF MESH  10/16/2018   Procedure: INSERTION OF MESH;  Surgeon: Ardis Hughs, MD;  Location: [facility];  Service: Urology;;  . NASAL SINUS SURGERY  1970  . ROBOTIC ASSISTED LAPAROSCOPIC SACROCOLPOPEXY N/A 10/16/2018   Procedure: XI ROBOTIC ASSISTED LAPAROSCOPIC SACROCOLPOPEXY;  Surgeon: Ardis Hughs, MD;  Location: [facility];  Service: Urology;  Laterality: N/A;  . TONSILLECTOMY     Family History  Problem Relation Age of Onset  . Emphysema Mother   . Lymphoma Mother   . Asthma Mother   . Cancer Father 38       lung cancer  . Coronary artery disease Brother   . Cancer Brother        breast cancer  . Breast cancer Brother   . Cancer Brother        lung cancer  . Diabetes Neg Hx   . Hypertension Neg Hx   . Colon cancer Neg Hx   . Esophageal cancer Neg Hx   . Rectal cancer Neg Hx   . Stomach cancer Neg Hx    Social History   Socioeconomic History  . Marital status: Married    Spouse name: Not on file  . Number of children: 2  . Years of education: 57  . Highest education level: Not on file  Occupational History  . Occupation: Environmental health practitioner  Tobacco Use  . Smoking status: Never Smoker  . Smokeless tobacco: Never Used  Vaping Use  . Vaping Use: Never used  Substance and Sexual Activity  . Alcohol use: No  . Drug use: No  . Sexual activity: Not Currently    Partners: Male    Birth control/protection: Surgical    Comment: hysterectomy  Other Topics Concern  . Not on file  Social History Narrative   HSG. Married '63. 1 son- 5'; 1-daughter-'68; grandchildren 3. 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 Marriage in good health. 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 Provided lead to [email or web address].   Social  Determinants of Health   Financial Resource Strain: Low Risk   . Difficulty of Paying Living Expenses: Not hard at all  Food Insecurity: No Food Insecurity  . Worried About Charity fundraiser in the Last Year: Never true  . Ran Out of Food in the Last Year: Never true  Transportation Needs: No Transportation Needs  . Lack of Transportation (Medical): No  . Lack of Transportation (Non-Medical): No  Physical Activity: Sufficiently Active  . Days of Exercise per Week: 4 days  . Minutes of Exercise per Session: 60 min  Stress: No Stress Concern Present  . Feeling of Stress : Not at all  Social Connections: Socially Integrated  . Frequency of Communication with Friends and Family: More than three times a week  . Frequency of Social Gatherings with Friends and Family: Once a week  . Attends Religious Services: More than 4 times per year  . Active  Member of Clubs or Organizations: Yes  . Attends Archivist Meetings: More than 4 times per year  . Marital Status: Married    Tobacco Counseling Counseling given: Not Answered   Clinical Intake:  Pre-visit preparation completed: Yes  Pain : No/denies pain     Nutritional Status: BMI > 30  Obese Nutritional Risks: None Diabetes: No  How often do you need to have someone help you when you read instructions, pamphlets, or other written materials from your doctor or pharmacy?: 1 - Never What is the last grade level you completed in school?: 12th grade  Diabetic?No  Interpreter Needed?: No  Information entered by :: Caroleen Hamman LPN   Activities of Daily Living In your present state of health, do you have any difficulty performing the following activities: 09/14/2020  Hearing? N  Vision? N  Difficulty concentrating or making decisions? N  Walking or climbing stairs? N  Dressing or bathing? N  Doing errands, shopping? N  Preparing Food and eating ? N  Using the Toilet? N  In the past six months, have you  accidently leaked urine? N  Do you have problems with loss of bowel control? N  Managing your Medications? N  Managing your Finances? N  Housekeeping or managing your Housekeeping? N  Some recent data might be hidden    Patient Care Team: Libby Maw, MD as PCP - General (Family Medicine) Irene Shipper, MD (Gastroenterology) Thalia Bloodgood, [place] as Referring Physician (Optometry) Kem Boroughs, FNP as Nurse Practitioner (Nurse Practitioner)  Indicate any recent Medical Services you may have received from other than [facility] providers in the past year (date may be approximate).     Assessment:   This is a routine wellness examination for [place].  Hearing/Vision screen  Hearing Screening   125Hz  250Hz  500Hz  1000Hz  2000Hz  3000Hz  4000Hz  6000Hz  8000Hz   Right ear:           Left ear:           Comments: No issues  Vision Screening Comments: Reading glasses Cataracts removed Last eye exam-2021 Dr. Katy Fitch  Dietary issues and exercise activities discussed: Current Exercise Habits: Home exercise routine, Type of exercise: walking (playing pickle ball), Time (Minutes): 60, Frequency (Times/Week): 4, Weekly Exercise (Minutes/Week): 240, Intensity: Mild  Goals    . Patient Stated     Continue current level of activity    . Weight (lb) < 140 lb (63.5 kg)      Depression Screen PHQ 2/9 Scores 09/14/2020 08/03/2020 09/29/2019 09/10/2019 08/22/2018 08/14/2017 06/21/2016  PHQ - 2 Score 0 0 - 0 0 0 1  PHQ- 9 Score - - - - - 0 -  Exception Documentation - - Other- indicate reason in comment box - - - -  Not completed - - Just completed - - - -    Fall Risk Fall Risk  09/14/2020 08/03/2020 09/10/2019 08/22/2018 08/14/2017  Falls in the past year? 1 1 0 No No  Number falls in past yr: 0 0 - - -  Injury with Fall? 0 0 0 - -  Comment - tripped over cord at home. - - -  Follow up Falls prevention discussed - - - -    Any stairs in or around the home? No  Home free of loose throw rugs in  walkways, pet beds, electrical cords, etc? Yes  Adequate lighting in your home to reduce risk of falls? Yes   ASSISTIVE DEVICES UTILIZED TO PREVENT FALLS:  Life  alert? No  Use of a cane, walker or w/c? No  Grab bars in the bathroom? Yes  Shower chair or bench in shower? No  Elevated toilet seat or a handicapped toilet? No   TIMED UP AND GO:  Was the test performed? No . Phone visit   Cognitive Function:No cognitive impairment noted.        Immunizations Immunization History  Administered Date(s) Administered  . Fluad Quad(high Dose 65+) 09/16/2019  . H1N1 01/07/2009  . Influenza Whole 09/23/2012  . Influenza, High Dose Seasonal PF 10/11/2017  . Influenza,inj,Quad PF,6+ Mos 09/28/2015, 08/02/2016, 09/04/2018  . Influenza-Unspecified 08/25/2014, 09/06/2020  . PFIZER SARS-COV-2 Vaccination 01/09/2020, 02/11/2020  . Pneumococcal Conjugate-13 02/18/2014  . Pneumococcal Polysaccharide-23 10/22/2012  . Td 10/22/2012  . Zoster 11/28/2012  . Zoster Recombinat (Shingrix) 04/01/2019    TDAP status: Up to date   Flu Vaccine Status: Up to Date  Pneumococcal vaccine status: Up to date   Covid-19 vaccine status: Completed vaccines  Qualifies for Shingles Vaccine? No   Zostavax completed Yes   Shingrix Completed?: Yes  Screening Tests Health Maintenance  Topic Date Due  . MAMMOGRAM  09/07/2020  . TETANUS/TDAP  10/22/2022  . INFLUENZA VACCINE  Completed  . DEXA SCAN  Completed  . COVID-19 Vaccine  Completed  . Hepatitis C Screening  Completed  . PNA vac Low Risk Adult  Completed    Health Maintenance  Health Maintenance Due  Topic Date Due  . MAMMOGRAM  09/07/2020    Colorectal cancer screening: No longer required.    Mammogram status: Ordered today. Pt provided with contact info and advised to call to schedule appt.    Bone Density status: Ordered today. Pt provided with contact info and advised to call to schedule appt.  Lung Cancer Screening: (Low Dose CT  Chest recommended if Age 36-80 years, 30 pack-year currently smoking OR have quit w/in 15years.) does not qualify.    Additional Screening:  Hepatitis C Screening:  Completed 01/17/2016  Vision Screening: Recommended annual ophthalmology exams for early detection of glaucoma and other disorders of the eye. Is the patient up to date with their annual eye exam?  Yes  Who is the provider or what is the name of the office in which the patient attends annual eye exams? Dr. Katy Fitch   Dental Screening: Recommended annual dental exams for proper oral hygiene  Community Resource Referral / Chronic Care Management: CRR required this visit?  No   CCM required this visit?  No      Plan:     I have personally reviewed and noted the following in the patient's chart:   . Medical and social history . Use of alcohol, tobacco or illicit drugs  . Current medications and supplements . Functional ability and status . Nutritional status . Physical activity . Advanced directives . List of other physicians . Hospitalizations, surgeries, and ER visits in previous 12 months . Vitals . Screenings to include cognitive, depression, and falls . Referrals and appointments  In addition, I have reviewed and discussed with patient certain preventive protocols, quality metrics, and best practice recommendations. A written personalized care plan for preventive services as well as general preventive health recommendations were provided to patient.  Due to this being a telephonic visit, the after visit summary with patients personalized plan was offered to patient via mail or my-chart. Patient would like to access on my-chart.  Marta Antu, LPN   [id number]  Nurse Health Advisor  Nurse Notes:  None

## 2020-09-14 ENCOUNTER — Ambulatory Visit (INDEPENDENT_AMBULATORY_CARE_PROVIDER_SITE_OTHER): Payer: Medicare Other

## 2020-09-14 VITALS — Ht 59.0 in | Wt 153.0 lb

## 2020-09-14 DIAGNOSIS — Z0000 Encounter for general adult medical examination without abnormal findings: Secondary | ICD-10-CM | POA: Diagnosis not present

## 2020-09-14 DIAGNOSIS — Z780 Asymptomatic menopausal state: Secondary | ICD-10-CM | POA: Diagnosis not present

## 2020-09-14 DIAGNOSIS — Z1231 Encounter for screening mammogram for malignant neoplasm of breast: Secondary | ICD-10-CM

## 2020-09-14 NOTE — Patient Instructions (Addendum)
Gloria Werner , Thank you for taking time to complete your Medicare Wellness Visit. I appreciate your ongoing commitment to your health goals. Please review the following plan we discussed and let me know if I can assist you in the future.   Screening recommendations/referrals: Colonoscopy: No longer indicated Mammogram: Ordered today-Someone will be calling you to schedule. Bone Density: Ordered today-Someone will be calling you to schedule. Recommended yearly ophthalmology/optometry visit for glaucoma screening and checkup Recommended yearly dental visit for hygiene and checkup  Vaccinations: Influenza vaccine: Up to Date Pneumococcal vaccine: Completed vaccines Tdap vaccine: Up to Date-Due-10/22/2022 Shingles vaccine: Completed vaccines Covid-19:Completed vaccines  Advanced directives: Please bring a copy for your chart.  Conditions/risks identified: See problem list  Next appointment: Follow up in one year for your annual wellness visit 09/22/21 @ 8:15am.   Preventive Care 77 Years and Older, Female Preventive care refers to lifestyle choices and visits with your health care provider that can promote health and wellness. What does preventive care include?  A yearly physical exam. This is also called an annual well check.  Dental exams once or twice a year.  Routine eye exams. Ask your health care provider how often you should have your eyes checked.  Personal lifestyle choices, including:  Daily care of your teeth and gums.  Regular physical activity.  Eating a healthy diet.  Avoiding tobacco and drug use.  Limiting alcohol use.  Practicing safe sex.  Taking low-dose aspirin every day.  Taking vitamin and mineral supplements as recommended by your health care provider. What happens during an annual well check? The services and screenings done by your health care provider during your annual well check will depend on your age, overall health, lifestyle risk  factors, and family history of disease. Counseling  Your health care provider may ask you questions about your:  Alcohol use.  Tobacco use.  Drug use.  Emotional well-being.  Home and relationship well-being.  Sexual activity.  Eating habits.  History of falls.  Memory and ability to understand (cognition).  Work and work Statistician.  Reproductive health. Screening  You may have the following tests or measurements:  Height, weight, and BMI.  Blood pressure.  Lipid and cholesterol levels. These may be checked every 5 years, or more frequently if you are over 42 years old.  Skin check.  Lung cancer screening. You may have this screening every year starting at age 77 if you have a 30-pack-year history of smoking and currently smoke or have quit within the past 15 years.  Fecal occult blood test (FOBT) of the stool. You may have this test every year starting at age 74.  Flexible sigmoidoscopy or colonoscopy. You may have a sigmoidoscopy every 5 years or a colonoscopy every 10 years starting at age 2.  Hepatitis C blood test.  Hepatitis B blood test.  Sexually transmitted disease (STD) testing.  Diabetes screening. This is done by checking your blood sugar (glucose) after you have not eaten for a while (fasting). You may have this done every 1-3 years.  Bone density scan. This is done to screen for osteoporosis. You may have this done starting at age 77.  Mammogram. This may be done every 1-2 years. Talk to your health care provider about how often you should have regular mammograms. Talk with your health care provider about your test results, treatment options, and if necessary, the need for more tests. Vaccines  Your health care provider may recommend certain vaccines, such as:  Influenza  vaccine. This is recommended every year.  Tetanus, diphtheria, and acellular pertussis (Tdap, Td) vaccine. You may need a Td booster every 10 years.  Zoster vaccine. You  may need this after age 77.  Pneumococcal 13-valent conjugate (PCV13) vaccine. One dose is recommended after age 77.  Pneumococcal polysaccharide (PPSV23) vaccine. One dose is recommended after age 77. Talk to your health care provider about which screenings and vaccines you need and how often you need them. This information is not intended to replace advice given to you by your health care provider. Make sure you discuss any questions you have with your health care provider. Document Released: 01/07/2016 Document Revised: 08/30/2016 Document Reviewed: 10/12/2015 Elsevier Interactive Patient Education  2017 [place] Prevention in the Home Falls can cause injuries. They can happen to people of all ages. There are many things you can do to make your home safe and to help prevent falls. What can I do on the outside of my home?  Regularly fix the edges of walkways and driveways and fix any cracks.  Remove anything that might make you trip as you walk through a door, such as a raised step or threshold.  Trim any bushes or trees on the path to your home.  Use bright outdoor lighting.  Clear any walking paths of anything that might make someone trip, such as rocks or tools.  Regularly check to see if handrails are loose or broken. Make sure that both sides of any steps have handrails.  Any raised decks and porches should have guardrails on the edges.  Have any leaves, snow, or ice cleared regularly.  Use sand or salt on walking paths during winter.  Clean up any spills in your garage right away. This includes oil or grease spills. What can I do in the bathroom?  Use night lights.  Install grab bars by the toilet and in the tub and shower. Do not use towel bars as grab bars.  Use non-skid mats or decals in the tub or shower.  If you need to sit down in the shower, use a plastic, non-slip stool.  Keep the floor dry. Clean up any water that spills on the floor as soon as  it happens.  Remove soap buildup in the tub or shower regularly.  Attach bath mats securely with double-sided non-slip rug tape.  Do not have throw rugs and other things on the floor that can make you trip. What can I do in the bedroom?  Use night lights.  Make sure that you have a light by your bed that is easy to reach.  Do not use any sheets or blankets that are too big for your bed. They should not hang down onto the floor.  Have a firm chair that has side arms. You can use this for support while you get dressed.  Do not have throw rugs and other things on the floor that can make you trip. What can I do in the kitchen?  Clean up any spills right away.  Avoid walking on wet floors.  Keep items that you use a lot in easy-to-reach places.  If you need to reach something above you, use a strong step stool that has a grab bar.  Keep electrical cords out of the way.  Do not use floor polish or wax that makes floors slippery. If you must use wax, use non-skid floor wax.  Do not have throw rugs and other things on the floor that can  make you trip. What can I do with my stairs?  Do not leave any items on the stairs.  Make sure that there are handrails on both sides of the stairs and use them. Fix handrails that are broken or loose. Make sure that handrails are as long as the stairways.  Check any carpeting to make sure that it is firmly attached to the stairs. Fix any carpet that is loose or worn.  Avoid having throw rugs at the top or bottom of the stairs. If you do have throw rugs, attach them to the floor with carpet tape.  Make sure that you have a light switch at the top of the stairs and the bottom of the stairs. If you do not have them, ask someone to add them for you. What else can I do to help prevent falls?  Wear shoes that:  Do not have high heels.  Have rubber bottoms.  Are comfortable and fit you well.  Are closed at the toe. Do not wear sandals.  If  you use a stepladder:  Make sure that it is fully opened. Do not climb a closed stepladder.  Make sure that both sides of the stepladder are locked into place.  Ask someone to hold it for you, if possible.  Clearly mark and make sure that you can see:  Any grab bars or handrails.  First and last steps.  Where the edge of each step is.  Use tools that help you move around (mobility aids) if they are needed. These include:  Canes.  Walkers.  Scooters.  Crutches.  Turn on the lights when you go into a dark area. Replace any light bulbs as soon as they burn out.  Set up your furniture so you have a clear path. Avoid moving your furniture around.  If any of your floors are uneven, fix them.  If there are any pets around you, be aware of where they are.  Review your medicines with your doctor. Some medicines can make you feel dizzy. This can increase your chance of falling. Ask your doctor what other things that you can do to help prevent falls. This information is not intended to replace advice given to you by your health care provider. Make sure you discuss any questions you have with your health care provider. Document Released: 10/07/2009 Document Revised: 05/18/2016 Document Reviewed: 01/15/2015 Elsevier Interactive Patient Education  2017 [employer].

## 2020-10-06 NOTE — Progress Notes (Signed)
Cardiology Office Note  Date:  10/06/2020   ID:  [place], Giammona October 14, 1941, MRN [id number]  PCP:  Libby Maw, MD  Cardiologist:  Dr. Johnsie Cancel  _____________  44 month follow-up  _____________   History of Present Illness: Gloria Werner is a 79 y.o. female with pmh of no cardiac disease, chronic cough, non smoker, HTN, GERD, plantar fascitis (L foot), arthtitis who is being seen for 12 month follow-up. Previously seen for evaluation of a murmur. Echo showed normal EF and mild MR.  Today, she reports overall she is doing well. Says she has chronic mild lower leg edema, essentially unchanged from a year ago. Edema mildly worse at night. She tried an OTC diuretic (unsure of the name) which insurance nurse recommended she stop, so she did. She has plantar fascitis on the left foot and wears a support stocking. Also has compression stockings for LLE that she uses intermittently. Her PCP started her on Crestor last year and is tolerating it well, although has chronic arthritis. She has gained about 12 lbs since COVID since she stopped walking daily. 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 She has been slowly getting back to activity, says she is exercising 4 days a week. She denies symptoms of palpitations, chest pain, shortness of breath, orthopnea, PND, lower extremity edema, claudication, dizziness, presyncope, syncope, bleeding, or neurologic sequela. The patient is tolerating medications without difficulties and is otherwise without complaint today. She is vaccinated and did not have COVID. She lives with her husband and they are able to care for themselves.   _____________   Past Medical History:  Diagnosis Date  . Allergic rhinitis due to pollen   . Cataract    NS OU  . Complication of anesthesia   . Cough   . Gastroesophageal reflux disease without esophagitis   . Irritable bowel syndrome   . 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 PONV  (postoperative nausea and vomiting)    Past Surgical History:  Procedure Laterality Date  . ABDOMINAL HYSTERECTOMY  1988   secondary to prolapse  . BLADDER SUSPENSION     A-P with Hyst  . INSERTION OF MESH  10/16/2018   Procedure: INSERTION OF MESH;  Surgeon: Ardis Hughs, MD;  Location: [facility];  Service: Urology;;  . NASAL SINUS SURGERY  1970  . ROBOTIC ASSISTED LAPAROSCOPIC SACROCOLPOPEXY N/A 10/16/2018   Procedure: XI ROBOTIC ASSISTED LAPAROSCOPIC SACROCOLPOPEXY;  Surgeon: Ardis Hughs, MD;  Location: [facility];  Service: Urology;  Laterality: N/A;  . TONSILLECTOMY     _____________  Current Outpatient Medications  Medication Sig Dispense Refill  . amLODipine (NORVASC) 5 MG tablet Take 1 tablet (5 mg total) by mouth daily. 90 tablet 3  . azelastine (ASTELIN) 0.1 % nasal spray Place 1 spray into both nostrils 2 (two) times daily as needed for rhinitis or allergies. Use in each nostril as directed 30 mL 11  . buPROPion (WELLBUTRIN) 75 MG tablet Take 1 tablet (75 mg total) by mouth 2 (two) times daily. 180 tablet 3  . calcium carbonate (OSCAL) 1500 (600 Ca) MG TABS tablet Take by mouth daily with breakfast.    . Carboxymethylcellul-Glycerin (LUBRICATING EYE DROPS OP) Place 1 drop into both eyes daily as needed (dry eyes).    Marland Kitchen ipratropium (ATROVENT) 0.03 % nasal spray Place 2 sprays into both nostrils every 12 (twelve) hours. 30 mL 11  . linaclotide (LINZESS) 145 MCG CAPS capsule Take 1 capsule (  145 mcg total) by mouth daily before breakfast. (Patient not taking: Reported on 09/14/2020) 90 capsule 3  . Multiple Vitamin (MULTIVITAMIN) tablet Take 1 tablet by mouth daily.    Marland Kitchen saccharomyces boulardii (FLORASTOR) 250 MG capsule Take 2 capsules (500 mg total) by mouth 2 (two) times daily. 120 capsule 3  . triamcinolone (NASACORT) 55 MCG/ACT AERO nasal inhaler Place 2 sprays into the nose daily.    . Vitamin D, Ergocalciferol, (DRISDOL) 1.25 MG (50000 UT) CAPS capsule TAKE 1 CAPSULE  BY MOUTH ONCE EVERY MONTH (EVERY  30  (THIRTY)  DAYS) 3 capsule 3   No current facility-administered medications for this visit.   _____________   Allergies:   Ace inhibitors, Codeine, and Lexapro [escitalopram]  _____________   Social History:  The patient  reports that she has never smoked. She has never used smokeless tobacco. She reports that she does not drink alcohol and does not use drugs.  _____________   Family History:  The patient's family history includes Asthma in her mother; Breast cancer in her brother; Cancer in her brother and brother; Cancer (age of onset: 80) in her father; Coronary artery disease in her brother; Emphysema in her mother; Lymphoma in her mother.  _____________   ROS:  Please see the history of present illness.   Positive for intermittent lower leg swelling,   All other systems are reviewed and negative.  _____________   PHYSICAL EXAM: VS:  LMP 12/25/1986 (Approximate)  , BMI There is no height or weight on file to calculate BMI. GEN: Well nourished, well developed, in no acute distress  HEENT: normal  Neck: no JVD, carotid bruits, or masses Cardiac: RRR; +9/7 systolic murmur 5/6 intercostal space laterally, no rubs, or gallops. No clubbing, cyanosis, edema.  Radials/DP/PT 2+ and equal bilaterally.  Respiratory:  clear to auscultation bilaterally, normal work of breathing GI: soft, nontender, nondistended, + BS MS: no deformity or atrophy  Skin: warm and dry, no rash Neuro:  Strength and sensation are intact Psych: euthymic mood, full affect _____________  EKG:   The ekg ordered today shows NSR, 76 bpm, PAC, unchaged from prior  Recent Labs: 08/03/2020: ALT 16; BUN 13; Creatinine, Ser 0.86; Hemoglobin 13.7; Platelets 182.0; Potassium 4.1; Sodium 140  08/03/2020: Cholesterol 162; Direct LDL 84.0; HDL 58.00; LDL Cholesterol 84; Total CHOL/HDL Ratio 3; Triglycerides 101.0; VLDL 20.2  CrCl cannot be calculated (Patient's most recent lab result is  older than the maximum 21 days allowed.).  Wt Readings from Last 3 Encounters:  09/14/20 153 lb (69.4 kg)  08/12/20 157 lb (71.2 kg)  08/03/20 155 lb (70.3 kg)     Echo 06/2019  1. The left ventricle has normal systolic function with an ejection  fraction of 60-65%. The cavity size was normal. Left ventricular diastolic  Doppler parameters are consistent with impaired relaxation.  2. The interatrial septum appears to be lipomatous.  3. The right ventricle has normal systolic function. The cavity was  normal.  4. The mitral valve is abnormal. There is mild mitral annular  calcification present.  5. The tricuspid valve is grossly normal.  6. The aortic valve is tricuspid. Mild thickening of the aortic valve. No  stenosis of the aortic valve.  7. The aorta is normal in size and structure.  8. Normal LV systolic function; mild diastolic dysfunction; mild MR and  TR.  _____________   ASSESSMENT AND PLAN:  Murmur - mild MR on echo from last year with normal EF - 2/6 systolic  murmur at 5th and 6th intercostal lateral position.  - Patient is asymptomatic. Will not repeat echo at this time  HTN - BP today 134/88, she has not had medication today. Normal BP at home 130/70 - continue amlodipine  Hypercholesterolemia - PCP started patient on Crestor last year - Lipid profile 07/2020 total chol 162, HDL 58, LDL 84, TG 101 - Patient is tolerating med well. 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 Advised against taking OTC diuretic - Echo from 06/2019 did show mild diastolic dysfunction - No significant swelling on exam today - She occasionally uses compression stockings and follows low salt diet.   Disposition:   FU with Dr. Johnsie Cancel in 1 year   Signed, Kaylub Detienne Ninfa Meeker, PA-C 10/06/2020 8:43 PM    _____________ [place]  [address]  [phone] (office) [phone] (fax)

## 2020-10-07 ENCOUNTER — Ambulatory Visit: Payer: Medicare Other | Admitting: Medical

## 2020-10-07 ENCOUNTER — Other Ambulatory Visit: Payer: Self-pay

## 2020-10-07 ENCOUNTER — Encounter: Payer: Self-pay | Admitting: Cardiology

## 2020-10-07 VITALS — BP 134/88 | HR 76 | Ht 60.0 in | Wt 155.2 lb

## 2020-10-07 DIAGNOSIS — R011 Cardiac murmur, unspecified: Secondary | ICD-10-CM | POA: Diagnosis not present

## 2020-10-07 DIAGNOSIS — E7800 Pure hypercholesterolemia, unspecified: Secondary | ICD-10-CM

## 2020-10-07 DIAGNOSIS — I10 Essential (primary) hypertension: Secondary | ICD-10-CM

## 2020-10-07 NOTE — Patient Instructions (Signed)
Medication Instructions:  °Your physician recommends that you continue on your current medications as directed. Please refer to the Current Medication list given to you today. ° °*If you need a refill on your cardiac medications before your next appointment, please call your pharmacy* ° ° °Lab Work: °None ordered ° °If you have labs (blood work) drawn today and your tests are completely normal, you will receive your results only by: °• MyChart Message (if you have MyChart) OR °• A paper copy in the mail °If you have any lab test that is abnormal or we need to change your treatment, we will call you to review the results. ° ° °Testing/Procedures: °None ordered ° ° °Follow-Up: °At [facility], you and your health needs are our priority.  As part of our continuing mission to provide you with exceptional heart care, we have created designated Provider Care Teams.  These Care Teams include your primary Cardiologist (physician) and Advanced Practice Providers (APPs -  Physician Assistants and Nurse Practitioners) who all work together to provide you with the care you need, when you need it. ° °We recommend signing up for the patient portal called "MyChart".  Sign up information is provided on this After Visit Summary.  MyChart is used to connect with patients for Virtual Visits (Telemedicine).  Patients are able to view lab/test results, encounter notes, upcoming appointments, etc.  Non-urgent messages can be sent to your provider as well.   °To learn more about what you can do with MyChart, go to [email or web address].   ° °Your next appointment:   °12 month(s) ° °The format for your next appointment:   °In Person ° °Provider:   °You may see No primary care provider on file. or one of the following Advanced Practice Providers on your designated Care Team:   °· Lori Gerhardt, NP °· Laura Ingold, NP °· Jill McDaniel, NP ° ° ° °Other Instructions ° ° °

## 2020-10-14 ENCOUNTER — Other Ambulatory Visit: Payer: Self-pay

## 2020-10-14 MED ORDER — AMLODIPINE BESYLATE 5 MG PO TABS: 5.0000 mg | ORAL_TABLET | Freq: Every day | ORAL | 0 refills | Status: DC

## 2020-11-08 ENCOUNTER — Ambulatory Visit: Payer: Medicare Other | Admitting: Family Medicine

## 2020-12-07 DIAGNOSIS — M9902 Segmental and somatic dysfunction of thoracic region: Secondary | ICD-10-CM | POA: Diagnosis not present

## 2020-12-07 DIAGNOSIS — M5441 Lumbago with sciatica, right side: Secondary | ICD-10-CM | POA: Diagnosis not present

## 2020-12-07 DIAGNOSIS — M5442 Lumbago with sciatica, left side: Secondary | ICD-10-CM | POA: Diagnosis not present

## 2020-12-07 DIAGNOSIS — M9903 Segmental and somatic dysfunction of lumbar region: Secondary | ICD-10-CM | POA: Diagnosis not present

## 2020-12-14 DIAGNOSIS — M9903 Segmental and somatic dysfunction of lumbar region: Secondary | ICD-10-CM | POA: Diagnosis not present

## 2020-12-14 DIAGNOSIS — M5441 Lumbago with sciatica, right side: Secondary | ICD-10-CM | POA: Diagnosis not present

## 2020-12-14 DIAGNOSIS — M5442 Lumbago with sciatica, left side: Secondary | ICD-10-CM | POA: Diagnosis not present

## 2020-12-14 DIAGNOSIS — M9902 Segmental and somatic dysfunction of thoracic region: Secondary | ICD-10-CM | POA: Diagnosis not present

## 2020-12-21 ENCOUNTER — Ambulatory Visit
Admission: RE | Admit: 2020-12-21 | Discharge: 2020-12-21 | Disposition: A | Discharge status: Home / Self Care | Payer: Medicare Other | Source: Ambulatory Visit | Attending: Family Medicine | Admitting: Family Medicine

## 2020-12-21 ENCOUNTER — Other Ambulatory Visit: Payer: Self-pay

## 2020-12-21 DIAGNOSIS — M8589 Other specified disorders of bone density and structure, multiple sites: Secondary | ICD-10-CM | POA: Diagnosis not present

## 2020-12-21 DIAGNOSIS — M9903 Segmental and somatic dysfunction of lumbar region: Secondary | ICD-10-CM | POA: Diagnosis not present

## 2020-12-21 DIAGNOSIS — Z780 Asymptomatic menopausal state: Secondary | ICD-10-CM

## 2020-12-21 DIAGNOSIS — M5441 Lumbago with sciatica, right side: Secondary | ICD-10-CM | POA: Diagnosis not present

## 2020-12-21 DIAGNOSIS — M9902 Segmental and somatic dysfunction of thoracic region: Secondary | ICD-10-CM | POA: Diagnosis not present

## 2020-12-21 DIAGNOSIS — M5442 Lumbago with sciatica, left side: Secondary | ICD-10-CM | POA: Diagnosis not present

## 2020-12-27 ENCOUNTER — Ambulatory Visit: Payer: Medicare Other | Admitting: Family Medicine

## 2020-12-28 DIAGNOSIS — M5441 Lumbago with sciatica, right side: Secondary | ICD-10-CM | POA: Diagnosis not present

## 2020-12-28 DIAGNOSIS — M9902 Segmental and somatic dysfunction of thoracic region: Secondary | ICD-10-CM | POA: Diagnosis not present

## 2020-12-28 DIAGNOSIS — M9903 Segmental and somatic dysfunction of lumbar region: Secondary | ICD-10-CM | POA: Diagnosis not present

## 2020-12-28 DIAGNOSIS — M5442 Lumbago with sciatica, left side: Secondary | ICD-10-CM | POA: Diagnosis not present

## 2020-12-31 ENCOUNTER — Other Ambulatory Visit: Payer: Medicare Other

## 2020-12-31 ENCOUNTER — Ambulatory Visit: Payer: Medicare Other

## 2021-01-05 ENCOUNTER — Other Ambulatory Visit: Payer: Self-pay

## 2021-01-05 MED ORDER — VITAMIN D (ERGOCALCIFEROL) 1.25 MG (50000 UNIT) PO CAPS: ORAL_CAPSULE | ORAL | 1 refills | Status: DC

## 2021-01-12 ENCOUNTER — Other Ambulatory Visit: Payer: Self-pay

## 2021-01-13 ENCOUNTER — Encounter: Payer: Self-pay | Admitting: Family Medicine

## 2021-01-13 ENCOUNTER — Ambulatory Visit (INDEPENDENT_AMBULATORY_CARE_PROVIDER_SITE_OTHER): Payer: Medicare Other | Admitting: Family Medicine

## 2021-01-13 ENCOUNTER — Ambulatory Visit (INDEPENDENT_AMBULATORY_CARE_PROVIDER_SITE_OTHER): Payer: Medicare Other

## 2021-01-13 ENCOUNTER — Ambulatory Visit: Payer: Medicare Other | Admitting: Family Medicine

## 2021-01-13 VITALS — BP 136/80 | HR 70 | Temp 97.8°F | Ht 60.0 in | Wt 154.4 lb

## 2021-01-13 DIAGNOSIS — M25552 Pain in left hip: Secondary | ICD-10-CM

## 2021-01-13 DIAGNOSIS — M79605 Pain in left leg: Secondary | ICD-10-CM | POA: Diagnosis not present

## 2021-01-13 DIAGNOSIS — M160 Bilateral primary osteoarthritis of hip: Secondary | ICD-10-CM

## 2021-01-13 DIAGNOSIS — M79604 Pain in right leg: Secondary | ICD-10-CM

## 2021-01-13 DIAGNOSIS — M5441 Lumbago with sciatica, right side: Secondary | ICD-10-CM | POA: Diagnosis not present

## 2021-01-13 DIAGNOSIS — F338 Other recurrent depressive disorders: Secondary | ICD-10-CM

## 2021-01-13 DIAGNOSIS — I10 Essential (primary) hypertension: Secondary | ICD-10-CM | POA: Diagnosis not present

## 2021-01-13 DIAGNOSIS — G8929 Other chronic pain: Secondary | ICD-10-CM

## 2021-01-13 DIAGNOSIS — I700 Atherosclerosis of aorta: Secondary | ICD-10-CM | POA: Diagnosis not present

## 2021-01-13 DIAGNOSIS — M5442 Lumbago with sciatica, left side: Secondary | ICD-10-CM | POA: Diagnosis not present

## 2021-01-13 DIAGNOSIS — M9902 Segmental and somatic dysfunction of thoracic region: Secondary | ICD-10-CM | POA: Diagnosis not present

## 2021-01-13 DIAGNOSIS — M25551 Pain in right hip: Secondary | ICD-10-CM

## 2021-01-13 DIAGNOSIS — M9903 Segmental and somatic dysfunction of lumbar region: Secondary | ICD-10-CM | POA: Diagnosis not present

## 2021-01-13 MED ORDER — TRIAMCINOLONE ACETONIDE 55 MCG/ACT NA AERO: 2.0000 | INHALATION_SPRAY | Freq: Every day | NASAL | 5 refills | Status: DC

## 2021-01-13 MED ORDER — MELOXICAM 7.5 MG PO TABS: 7.5000 mg | ORAL_TABLET | Freq: Every day | ORAL | 1 refills | Status: DC

## 2021-01-13 MED ORDER — ROSUVASTATIN CALCIUM 5 MG PO TABS: 5.0000 mg | ORAL_TABLET | Freq: Every day | ORAL | 3 refills | Status: DC

## 2021-01-13 MED ORDER — BUPROPION HCL 75 MG PO TABS: 75.0000 mg | ORAL_TABLET | Freq: Two times a day (BID) | ORAL | 3 refills | Status: DC

## 2021-01-13 MED ORDER — AMLODIPINE BESYLATE 5 MG PO TABS: 5.0000 mg | ORAL_TABLET | Freq: Every day | ORAL | 3 refills | Status: DC

## 2021-01-13 NOTE — Progress Notes (Signed)
Gloria Werner is a 80 y.o. female  Chief Complaint  Patient presents with  . Follow-up    3 month f/u and also having pains in both legs and RT groin area x 6 months.  She has been taking Ibuprofen and Tylenol with some relief.      HPI: Gloria Werner is a 80 y.o. female patient of my colleague Dr. Ethelene Hal who is seen today for routine 3 mo f/u on chronic medical issues including HTN, aortic atherosclerosis, seasonal affective disorder.  Lab Results  Component Value Date   NA 140 08/03/2020   K 4.1 08/03/2020   CREATININE 0.86 08/03/2020   GFRNONAA >60 10/17/2018   GFRAA >60 10/17/2018   GLUCOSE 92 08/03/2020   Lab Results  Component Value Date   CHOL 162 08/03/2020   HDL 58.00 08/03/2020   LDLCALC 84 08/03/2020   LDLDIRECT 84.0 08/03/2020   TRIG 101.0 08/03/2020   CHOLHDL 3 08/03/2020   Pt is not on a statin. She was tried on crestor 5mg  daily but thought it was causing joint pains so at last OV with PCP in 07/2020, he advised her to stop med x 3 wks and see if symptoms resolved. If not, she was to restart med. Today she states symptoms did not change but she has not restarted crestor yet.   The 10-year ASCVD risk score Mikey Bussing DC Brooke Bonito., et al., 2013) is: 27.8%   Values used to calculate the score:     Age: 31 years     Sex: Female     Is Non-Hispanic African American: No     Diabetic: No     Tobacco smoker: No     Systolic Blood Pressure: [id number] mmHg     Is BP treated: Yes     HDL Cholesterol: 58 mg/dL     Total Cholesterol: 162 mg/dL  Pt also complains of 6 mo + h/o B/L upper leg (thigh) pain and Rt groin pain. No pain at rest. Pain is when she stands from sitting and if she is walking. She describes as a "cross between being stiff and being achy". She does endorse some hip pain in additional to upper leg pain when she rolls over on the sofa or in bed. No radiation of pain. No numbness or tingling.  She takes tylenol or ibuprofen at least once per day.   Past  Medical History:  Diagnosis Date  . Allergic rhinitis due to pollen   . Cataract    NS OU  . Complication of anesthesia   . Cough   . Gastroesophageal reflux disease without esophagitis   . Irritable bowel syndrome   . 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 PONV (postoperative nausea and vomiting)     Past Surgical History:  Procedure Laterality Date  . ABDOMINAL HYSTERECTOMY  1988   secondary to prolapse  . BLADDER SUSPENSION     A-P with Hyst  . INSERTION OF MESH  10/16/2018   Procedure: INSERTION OF MESH;  Surgeon: Ardis Hughs, MD;  Location: [facility];  Service: Urology;;  . NASAL SINUS SURGERY  1970  . ROBOTIC ASSISTED LAPAROSCOPIC SACROCOLPOPEXY N/A 10/16/2018   Procedure: XI ROBOTIC ASSISTED LAPAROSCOPIC SACROCOLPOPEXY;  Surgeon: Ardis Hughs, MD;  Location: [facility];  Service: Urology;  Laterality: N/A;  . TONSILLECTOMY      Social History   Socioeconomic History  . Marital status: Married    Spouse name: Not on file  . Number of children: 2  .  Years of education: 29  . Highest education level: Not on file  Occupational History  . Occupation: Environmental health practitioner  Tobacco Use  . Smoking status: Never Smoker  . Smokeless tobacco: Never Used  Vaping Use  . Vaping Use: Never used  Substance and Sexual Activity  . Alcohol use: No  . Drug use: No  . Sexual activity: Not Currently    Partners: Male    Birth control/protection: Surgical    Comment: hysterectomy  Other Topics Concern  . Not on file  Social History Narrative   HSG. Married '63. 1 son- 27'; 1-daughter-'68; grandchildren 3. 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 Marriage in good health. 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 Provided lead to [email or web address].   Social Determinants of Health   Financial Resource Strain: Low Risk   . Difficulty of Paying Living Expenses: Not hard at all  Food Insecurity: No Food  Insecurity  . Worried About Charity fundraiser in the Last Year: Never true  . Ran Out of Food in the Last Year: Never true  Transportation Needs: No Transportation Needs  . Lack of Transportation (Medical): No  . Lack of Transportation (Non-Medical): No  Physical Activity: Sufficiently Active  . Days of Exercise per Week: 4 days  . Minutes of Exercise per Session: 60 min  Stress: No Stress Concern Present  . Feeling of Stress : Not at all  Social Connections: Socially Integrated  . Frequency of Communication with Friends and Family: More than three times a week  . Frequency of Social Gatherings with Friends and Family: Once a week  . Attends Religious Services: More than 4 times per year  . Active Member of Clubs or Organizations: Yes  . Attends Archivist Meetings: More than 4 times per year  . Marital Status: Married  Human resources officer Violence: Not At Risk  . Fear of Current or Ex-Partner: No  . Emotionally Abused: No  . Physically Abused: No  . Sexually Abused: No    Family History  Problem Relation Age of Onset  . Emphysema Mother   . Lymphoma Mother   . Asthma Mother   . Cancer Father 26       lung cancer  . Coronary artery disease Brother   . Cancer Brother        breast cancer  . Breast cancer Brother   . Cancer Brother        lung cancer  . Diabetes Neg Hx   . Hypertension Neg Hx   . Colon cancer Neg Hx   . Esophageal cancer Neg Hx   . Rectal cancer Neg Hx   . Stomach cancer Neg Hx      Immunization History  Administered Date(s) Administered  . Fluad Quad(high Dose 65+) 09/16/2019  . H1N1 01/07/2009  . Influenza Whole 09/23/2012  . Influenza, High Dose Seasonal PF 10/11/2017  . Influenza,inj,Quad PF,6+ Mos 09/28/2015, 08/02/2016, 09/04/2018  . Influenza-Unspecified 08/25/2014, 09/06/2020  . PFIZER(Purple Top)SARS-COV-2 Vaccination 01/09/2020, 02/11/2020  . Pneumococcal Conjugate-13 02/18/2014  . Pneumococcal Polysaccharide-23 10/22/2012  .  Td 10/22/2012  . Zoster 11/28/2012  . Zoster Recombinat (Shingrix) 04/01/2019    Outpatient Encounter Medications as of 01/13/2021  Medication Sig  . amLODipine (NORVASC) 5 MG tablet Take 1 tablet (5 mg total) by mouth daily.  Marland Kitchen azelastine (ASTELIN) 0.1 % nasal spray Place 1 spray into both nostrils 2 (two) times daily as needed for  rhinitis or allergies. Use in each nostril as directed  . buPROPion (WELLBUTRIN) 75 MG tablet Take 1 tablet (75 mg total) by mouth 2 (two) times daily.  . calcium carbonate (OSCAL) 1500 (600 Ca) MG TABS tablet Take by mouth daily with breakfast.  . Carboxymethylcellul-Glycerin (LUBRICATING EYE DROPS OP) Place 1 drop into both eyes daily as needed (dry eyes).  . Multiple Vitamin (MULTIVITAMIN) tablet Take 1 tablet by mouth daily.  Marland Kitchen saccharomyces boulardii (FLORASTOR) 250 MG capsule Take 2 capsules (500 mg total) by mouth 2 (two) times daily.  Marland Kitchen triamcinolone (NASACORT) 55 MCG/ACT AERO nasal inhaler Place 2 sprays into the nose daily.  . Vitamin D, Ergocalciferol, (DRISDOL) 1.25 MG (50000 UNIT) CAPS capsule TAKE 1 CAPSULE BY MOUTH ONCE EVERY MONTH (EVERY  30  (THIRTY)  DAYS)  . ipratropium (ATROVENT) 0.03 % nasal spray Place 2 sprays into both nostrils every 12 (twelve) hours. (Patient not taking: Reported on 01/13/2021)   No facility-administered encounter medications on file as of 01/13/2021.     ROS: Pertinent positives and negatives noted in HPI. Remainder of ROS non-contributory    Allergies  Allergen Reactions  . Ace Inhibitors Cough  . Codeine Nausea And Vomiting  . Lexapro [Escitalopram] Diarrhea and Other (See Comments)    headache    LMP 12/25/1986 (Approximate)    BP Readings from Last 3 Encounters:  10/07/20 134/88  08/12/20 110/78  08/03/20 126/78   Pulse Readings from Last 3 Encounters:  10/07/20 76  08/12/20 68  08/03/20 76   Wt Readings from Last 3 Encounters:  10/07/20 155 lb 3.2 oz (70.4 kg)  09/14/20 153 lb (69.4 kg)   08/12/20 157 lb (71.2 kg)     Physical Exam Constitutional:      General: She is not in acute distress.    Appearance: Normal appearance. She is not ill-appearing.  Pulmonary:     Breath sounds: No wheezing.  Musculoskeletal:     Right lower leg: No deformity, tenderness or bony tenderness. No edema.     Left lower leg: No deformity, tenderness or bony tenderness. No edema.  Neurological:     Mental Status: She is alert and oriented to person, place, and time.     Coordination: Coordination normal.     Gait: Gait normal.  Psychiatric:        Mood and Affect: Mood normal.        Behavior: Behavior normal.      A/P:  1. Essential hypertension - controlled, at goal Refill: - amLODipine (NORVASC) 5 MG tablet; Take 1 tablet (5 mg total) by mouth daily.  Dispense: 90 tablet; Refill: 3  2. Aortic atherosclerosis ([place]) - restart Rx: - rosuvastatin (CRESTOR) 5 MG tablet; Take 1 tablet (5 mg total) by mouth daily.  Dispense: 90 tablet; Refill: 3  3. Seasonal affective disorder ([place]) - stable, controlled Refill: - buPROPion (WELLBUTRIN) 75 MG tablet; Take 1 tablet (75 mg total) by mouth 2 (two) times daily.  Dispense: 180 tablet; Refill: 3  4. Chronic hip pain, bilateral 5. Bilateral leg pain - DG Hip Unilat W OR W/O Pelvis 2-3 Views Left - DG Hip Unilat W OR W/O Pelvis 2-3 Views Right - trial of heat/ice, exercises daily, xrays today, consider daily NSAID - will contact pt once xray results available   This visit occurred during the SARS-CoV-2 public health emergency.  Safety protocols were in place, including screening questions prior to the visit, additional usage of staff PPE, and extensive cleaning of  exam room while observing appropriate contact time as indicated for disinfecting solutions.

## 2021-01-13 NOTE — Patient Instructions (Addendum)
Restart crestor 5mg  daily   Stop wearing panty liner Use witch hazel pads ([employer] is the brand)   Xrays today Trial of heat (or ice) 2x/day - 10-22min We'll discuss medication options once I have xray result Exercises - see below   Hip Exercises Ask your health care provider which exercises are safe for you. Do exercises exactly as told by your health care provider and adjust them as directed. It is normal to feel mild stretching, pulling, tightness, or discomfort as you do these exercises. Stop right away if you feel sudden pain or your pain gets worse. Do not begin these exercises until told by your health care provider. Stretching and range-of-motion exercises These exercises warm up your muscles and joints and improve the movement and flexibility of your hip. These exercises also help to relieve pain, numbness, and tingling. You may be asked to limit your range of motion if you had a hip replacement. Talk to your health care provider about these restrictions. Hamstrings, supine 1. Lie on your back (supine position). 2. Loop a belt or towel over the ball of your left / right foot. The ball of your foot is on the walking surface, right under your toes. 3. Straighten your left / right knee and slowly pull on the belt or towel to raise your leg until you feel a gentle stretch behind your knee (hamstring). ? Do not let your knee bend while you do this. ? Keep your other leg flat on the floor. 4. Hold this position for __________ seconds. 5. Slowly return your leg to the starting position. Repeat __________ times. Complete this exercise __________ times a day.   Hip rotation 1. Lie on your back on a firm surface. 2. With your left / right hand, gently pull your left / right knee toward the shoulder that is on the same side of the body. Stop when your knee is pointing toward the ceiling. 3. Hold your left / right ankle with your other hand. 4. Keeping your knee steady, gently pull your left  / right ankle toward your other shoulder until you feel a stretch in your buttocks. ? Keep your hips and shoulders firmly planted while you do this stretch. 5. Hold this position for __________ seconds. Repeat __________ times. Complete this exercise __________ times a day.   Seated stretch This exercise is sometimes called hamstrings and adductors stretch. 1. Sit on the floor with your legs stretched wide. Keep your knees straight during this exercise. 2. Keeping your head and back in a straight line, bend at your waist to reach for your left foot (position A). You should feel a stretch in your right inner thigh (adductors). 3. Hold this position for __________ seconds. Then slowly return to the upright position. 4. Keeping your head and back in a straight line, bend at your waist to reach forward (position B). You should feel a stretch behind both of your thighs and knees (hamstrings). 5. Hold this position for __________ seconds. Then slowly return to the upright position. 6. Keeping your head and back in a straight line, bend at your waist to reach for your right foot (position C). You should feel a stretch in your left inner thigh (adductors). 7. Hold this position for __________ seconds. Then slowly return to the upright position. Repeat __________ times. Complete this exercise __________ times a day.   Lunge This exercise stretches the muscles of the hip (hip flexors). 1. Place your left / right knee on the floor  and bend your other knee so that is directly over your ankle. You should be half-kneeling. 2. Keep good posture with your head over your shoulders. 3. Tighten your buttocks to point your tailbone downward. This will prevent your back from arching too much. 4. You should feel a gentle stretch in the front of your left / right thigh and hip. If you do not feel a stretch, slide your other foot forward slightly and then slowly lunge forward with your chest up until your knee once again  lines up over your ankle. ? Make sure your tailbone continues to point downward. 5. Hold this position for __________ seconds. 6. Slowly return to the starting position. Repeat __________ times. Complete this exercise __________ times a day.   Strengthening exercises These exercises build strength and endurance in your hip. Endurance is the ability to use your muscles for a long time, even after they get tired. Bridge This exercise strengthens the muscles of your hip (hip extensors). 1. Lie on your back on a firm surface with your knees bent and your feet flat on the floor. 2. Tighten your buttocks muscles and lift your bottom off the floor until the trunk of your body and your hips are level with your thighs. ? Do not arch your back. ? You should feel the muscles working in your buttocks and the back of your thighs. If you do not feel these muscles, slide your feet 1-2 inches (2.5-5 cm) farther away from your buttocks. 3. Hold this position for __________ seconds. 4. Slowly lower your hips to the starting position. 5. Let your muscles relax completely between repetitions. Repeat __________ times. Complete this exercise __________ times a day.   Straight leg raises, side-lying This exercise strengthens the muscles that move the hip joint away from the center of the body (hip abductors). 1. Lie on your side with your left / right leg in the top position. Lie so your head, shoulder, hip, and knee line up. You may bend your bottom knee slightly to help you balance. 2. Roll your hips slightly forward, so your hips are stacked directly over each other and your left / right knee is facing forward. 3. Leading with your heel, lift your top leg 4-6 inches (10-15 cm). You should feel the muscles in your top hip lifting. ? Do not let your foot drift forward. ? Do not let your knee roll toward the ceiling. 4. Hold this position for __________ seconds. 5. Slowly return to the starting position. 6. Let  your muscles relax completely between repetitions. Repeat __________ times. Complete this exercise __________ times a day.   Straight leg raises, side-lying This exercise strengthens the muscles that move the hip joint toward the center of the body (hip adductors). 1. Lie on your side with your left / right leg in the bottom position. Lie so your head, shoulder, hip, and knee line up. You may place your upper foot in front to help you balance. 2. Roll your hips slightly forward, so your hips are stacked directly over each other and your left / right knee is facing forward. 3. Tense the muscles in your inner thigh and lift your bottom leg 4-6 inches (10-15 cm). 4. Hold this position for __________ seconds. 5. Slowly return to the starting position. 6. Let your muscles relax completely between repetitions. Repeat __________ times. Complete this exercise __________ times a day.   Straight leg raises, supine This exercise strengthens the muscles in the front of your thigh (  quadriceps). 1. Lie on your back (supine position) with your left / right leg extended and your other knee bent. 2. Tense the muscles in the front of your left / right thigh. You should see your kneecap slide up or see increased dimpling just above your knee. 3. Keep these muscles tight as you raise your leg 4-6 inches (10-15 cm) off the floor. Do not let your knee bend. 4. Hold this position for __________ seconds. 5. Keep these muscles tense as you lower your leg. 6. Relax the muscles slowly and completely between repetitions. Repeat __________ times. Complete this exercise __________ times a day.   Hip abductors, standing This exercise strengthens the muscles that move the leg and hip joint away from the center of the body (hip abductors). 1. Tie one end of a rubber exercise band or tubing to a secure surface, such as a chair, table, or pole. 2. Loop the other end of the band or tubing around your left / right  ankle. 3. Keeping your ankle with the band or tubing directly opposite the secured end, step away until there is tension in the tubing or band. Hold on to a chair, table, or pole as needed for balance. 4. Lift your left / right leg out to your side. While you do this: ? Keep your back upright. ? Keep your shoulders over your hips. ? Keep your toes pointing forward. ? Make sure to use your hip muscles to slowly lift your leg. Do not tip your body or forcefully lift your leg. 5. Hold this position for __________ seconds. 6. Slowly return to the starting position. Repeat __________ times. Complete this exercise __________ times a day. Squats This exercise strengthens the muscles in the front of your thigh (quadriceps). 1. Stand in a door frame so your feet and knees are in line with the frame. You may place your hands on the frame for balance. 2. Slowly bend your knees and lower your hips like you are going to sit in a chair. ? Keep your lower legs in a straight-up-and-down position. ? Do not let your hips go lower than your knees. ? Do not bend your knees lower than told by your health care provider. ? If your hip pain increases, do not bend as low. 3. Hold this position for ___________ seconds. 4. Slowly push with your legs to return to standing. Do not use your hands to pull yourself to standing. Repeat __________ times. Complete this exercise __________ times a day. This information is not intended to replace advice given to you by your health care provider. Make sure you discuss any questions you have with your health care provider. Document Revised: 07/17/2019 Document Reviewed: 10/22/2018 Elsevier Patient Education  2021 [employer].

## 2021-01-26 ENCOUNTER — Ambulatory Visit
Admission: RE | Admit: 2021-01-26 | Discharge: 2021-01-26 | Disposition: A | Discharge status: Home / Self Care | Payer: Medicare Other | Source: Ambulatory Visit | Attending: Family Medicine | Admitting: Family Medicine

## 2021-01-26 ENCOUNTER — Other Ambulatory Visit: Payer: Self-pay

## 2021-01-26 DIAGNOSIS — Z1231 Encounter for screening mammogram for malignant neoplasm of breast: Secondary | ICD-10-CM | POA: Diagnosis not present

## 2021-01-28 ENCOUNTER — Telehealth: Payer: Self-pay

## 2021-01-28 ENCOUNTER — Other Ambulatory Visit: Payer: Self-pay | Admitting: Family Medicine

## 2021-01-28 DIAGNOSIS — R928 Other abnormal and inconclusive findings on diagnostic imaging of breast: Secondary | ICD-10-CM

## 2021-01-28 NOTE — Telephone Encounter (Signed)
Patient notified VIA phone and has already been scheduled for more test on the RT breast for 02/14/21.  No further questions. Dm/cma

## 2021-01-28 NOTE — Telephone Encounter (Signed)
Routine screening mammo was inconclusive on the Rt side and they want additional images to get a better/more detailed look at the Rt breast. No definitive mass or abnormality was identified. The report is "possible mass or abnormality". This happens often and this more targeted mammo and possible [name] will just give more detail/clear answers.

## 2021-01-28 NOTE — Telephone Encounter (Signed)
Patient wants to discuss results in chart from 01/26/21.   Please review and advise.   Thanks. Dm/cma

## 2021-01-28 NOTE — Telephone Encounter (Signed)
Pt calling to speak with provider in regards to her Mammogram results.  I informed pt that Dr. Bryan Lemma is seeing patient this morning and will get back to her as soon as she can.  Please advise.  CB# [phone].

## 2021-01-31 ENCOUNTER — Other Ambulatory Visit: Payer: Self-pay

## 2021-01-31 ENCOUNTER — Ambulatory Visit
Admission: RE | Admit: 2021-01-31 | Discharge: 2021-01-31 | Disposition: A | Discharge status: Home / Self Care | Payer: Medicare Other | Source: Ambulatory Visit | Attending: Family Medicine | Admitting: Family Medicine

## 2021-01-31 DIAGNOSIS — R928 Other abnormal and inconclusive findings on diagnostic imaging of breast: Secondary | ICD-10-CM

## 2021-01-31 DIAGNOSIS — N6001 Solitary cyst of right breast: Secondary | ICD-10-CM | POA: Diagnosis not present

## 2021-02-01 DIAGNOSIS — M5442 Lumbago with sciatica, left side: Secondary | ICD-10-CM | POA: Diagnosis not present

## 2021-02-01 DIAGNOSIS — M9902 Segmental and somatic dysfunction of thoracic region: Secondary | ICD-10-CM | POA: Diagnosis not present

## 2021-02-01 DIAGNOSIS — M5441 Lumbago with sciatica, right side: Secondary | ICD-10-CM | POA: Diagnosis not present

## 2021-02-01 DIAGNOSIS — M9903 Segmental and somatic dysfunction of lumbar region: Secondary | ICD-10-CM | POA: Diagnosis not present

## 2021-02-08 DIAGNOSIS — Z961 Presence of intraocular lens: Secondary | ICD-10-CM | POA: Diagnosis not present

## 2021-02-08 DIAGNOSIS — H33312 Horseshoe tear of retina without detachment, left eye: Secondary | ICD-10-CM | POA: Diagnosis not present

## 2021-02-08 DIAGNOSIS — H04123 Dry eye syndrome of bilateral lacrimal glands: Secondary | ICD-10-CM | POA: Diagnosis not present

## 2021-02-09 ENCOUNTER — Encounter: Payer: Self-pay | Admitting: Family Medicine

## 2021-02-14 ENCOUNTER — Other Ambulatory Visit: Payer: Medicare Other

## 2021-02-22 DIAGNOSIS — G4733 Obstructive sleep apnea (adult) (pediatric): Secondary | ICD-10-CM | POA: Diagnosis not present

## 2021-05-03 DIAGNOSIS — M5441 Lumbago with sciatica, right side: Secondary | ICD-10-CM | POA: Diagnosis not present

## 2021-05-03 DIAGNOSIS — M9904 Segmental and somatic dysfunction of sacral region: Secondary | ICD-10-CM | POA: Diagnosis not present

## 2021-05-03 DIAGNOSIS — M9903 Segmental and somatic dysfunction of lumbar region: Secondary | ICD-10-CM | POA: Diagnosis not present

## 2021-05-03 DIAGNOSIS — M9901 Segmental and somatic dysfunction of cervical region: Secondary | ICD-10-CM | POA: Diagnosis not present

## 2021-05-25 ENCOUNTER — Other Ambulatory Visit: Payer: Self-pay

## 2021-05-26 ENCOUNTER — Encounter: Payer: Self-pay | Admitting: Family Medicine

## 2021-05-26 ENCOUNTER — Ambulatory Visit (INDEPENDENT_AMBULATORY_CARE_PROVIDER_SITE_OTHER): Payer: Medicare Other | Admitting: Family Medicine

## 2021-05-26 VITALS — BP 146/96 | HR 88 | Temp 98.2°F | Ht 60.0 in | Wt 153.0 lb

## 2021-05-26 DIAGNOSIS — R1031 Right lower quadrant pain: Secondary | ICD-10-CM | POA: Diagnosis not present

## 2021-05-26 DIAGNOSIS — K581 Irritable bowel syndrome with constipation: Secondary | ICD-10-CM

## 2021-05-26 DIAGNOSIS — R140 Abdominal distension (gaseous): Secondary | ICD-10-CM | POA: Diagnosis not present

## 2021-05-26 DIAGNOSIS — M1611 Unilateral primary osteoarthritis, right hip: Secondary | ICD-10-CM | POA: Diagnosis not present

## 2021-05-26 MED ORDER — MELOXICAM 7.5 MG PO TABS: 7.5000 mg | ORAL_TABLET | Freq: Every day | ORAL | 1 refills | Status: DC

## 2021-05-26 MED ORDER — DICYCLOMINE HCL 10 MG PO CAPS
10.0000 mg | ORAL_CAPSULE | Freq: Three times a day (TID) | ORAL | 0 refills | Status: DC
Start: 2021-05-26 — End: 2022-08-10

## 2021-05-26 NOTE — Patient Instructions (Signed)
For stomach: tke bentyl 10mg  1 tab 3x/day with meals and then at bedtime  For Rt hip/groin: Take meloxicam 7.5mg  1 tab daily. If no/minimal relief after 1-2 wks, increase to 1 tab 2x/day with food Ice pack 2x/day - 10-41min Exercises - daily

## 2021-05-26 NOTE — Progress Notes (Signed)
Gloria Werner is a 80 y.o. female  Chief Complaint  Patient presents with  . Acute Visit    Pt c/o bloating issues, pt also c/o ongoing lt leg pain, worsening in the groin area.    HPI: Gloria Werner is a 80 y.o. female patient who complains of  1. Pt has a h/o IBS. She complains of upper abdominal bloating after eating x > 1 year. She saw Dr. Henrene Pastor with [facility] in 01/2020. + decreased appetite. No n/v. No abd pain. Some issues with constipation, not new or worse. No diarrhea. Bloating is not related to type of food or quantity of food. No increased in belching but pt notes increased flatus. No heartburn or indigestion. Bloating is not present when she wakes up. No weight loss. Normal EGD, colonoscopy, CT abd/pelvis in 01/2020. She takes Tums infrequently. She is still on florastor 2x/day.  She works for [employer].  Wt Readings from Last 3 Encounters:  05/26/21 153 lb (69.4 kg)  01/13/21 154 lb 6.4 oz (70 kg)  10/07/20 155 lb 3.2 oz (70.4 kg)   2. Pt with Rt hip and groin pain x years. It hurts to bear weight. + stiffness. Decreased ROM. No swelling.  Xray in 12/2020 - severe osteoarthritis changes in Rt hip  She tried meloxicam but stopped taking regularly, only takes very PRN. Takes ibuprofen 400mg  BID PRN.  Son with blood clot in hip --> PE so pt is very concerned she has the same.  EXAM: DG PELVIS AND BILATERAL HIPS: 3 V  COMPARISON:  None.  FINDINGS: Frontal pelvis as well as lateral view of each hip joint obtained. There is no appreciable fracture or dislocation. There is moderately severe narrowing of the right hip joint. There is milder narrowing of the left hip joint. Sacroiliac joints appear normal. No erosive change.  IMPRESSION: Osteoarthritic change in each hip joint, considerably more severe on the right than the left. No fracture or dislocation. No avascular necrosis is appreciable by radiography.   Electronically Signed   By: Lowella Grip III M.D.   On: 01/13/2021 15:11   Past Medical History:  Diagnosis Date  . Allergic rhinitis due to pollen   . Cataract    NS OU  . Complication of anesthesia   . Cough   . Gastroesophageal reflux disease without esophagitis   . Irritable bowel syndrome   . 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 PONV (postoperative nausea and vomiting)     Past Surgical History:  Procedure Laterality Date  . ABDOMINAL HYSTERECTOMY  1988   secondary to prolapse  . BLADDER SUSPENSION     A-P with Hyst  . INSERTION OF MESH  10/16/2018   Procedure: INSERTION OF MESH;  Surgeon: Ardis Hughs, MD;  Location: [facility];  Service: Urology;;  . NASAL SINUS SURGERY  1970  . ROBOTIC ASSISTED LAPAROSCOPIC SACROCOLPOPEXY N/A 10/16/2018   Procedure: XI ROBOTIC ASSISTED LAPAROSCOPIC SACROCOLPOPEXY;  Surgeon: Ardis Hughs, MD;  Location: [facility];  Service: Urology;  Laterality: N/A;  . TONSILLECTOMY      Social History   Socioeconomic History  . Marital status: Married    Spouse name: Not on file  . Number of children: 2  . Years of education: 55  . Highest education level: Not on file  Occupational History  . Occupation: Environmental health practitioner  Tobacco Use  . Smoking status: Never Smoker  . Smokeless tobacco: Never Used  Vaping Use  . Vaping Use: Never  used  Substance and Sexual Activity  . Alcohol use: No  . Drug use: No  . Sexual activity: Not Currently    Partners: Male    Birth control/protection: Surgical    Comment: hysterectomy  Other Topics Concern  . Not on file  Social History Narrative   HSG. Married '63. 1 son- 5'; 1-daughter-'68; grandchildren 3. 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 Marriage in good health. 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 Provided lead to [email or web address].   Social Determinants of Health   Financial Resource Strain: Low Risk   . Difficulty of Paying Living  Expenses: Not hard at all  Food Insecurity: No Food Insecurity  . Worried About Charity fundraiser in the Last Year: Never true  . Ran Out of Food in the Last Year: Never true  Transportation Needs: No Transportation Needs  . Lack of Transportation (Medical): No  . Lack of Transportation (Non-Medical): No  Physical Activity: Sufficiently Active  . Days of Exercise per Week: 4 days  . Minutes of Exercise per Session: 60 min  Stress: No Stress Concern Present  . Feeling of Stress : Not at all  Social Connections: Socially Integrated  . Frequency of Communication with Friends and Family: More than three times a week  . Frequency of Social Gatherings with Friends and Family: Once a week  . Attends Religious Services: More than 4 times per year  . Active Member of Clubs or Organizations: Yes  . Attends Archivist Meetings: More than 4 times per year  . Marital Status: Married  Human resources officer Violence: Not At Risk  . Fear of Current or Ex-Partner: No  . Emotionally Abused: No  . Physically Abused: No  . Sexually Abused: No    Family History  Problem Relation Age of Onset  . Emphysema Mother   . Lymphoma Mother   . Asthma Mother   . Cancer Father 54       lung cancer  . Coronary artery disease Brother   . Cancer Brother        breast cancer  . Breast cancer Brother   . Cancer Brother        lung cancer  . Diabetes Neg Hx   . Hypertension Neg Hx   . Colon cancer Neg Hx   . Esophageal cancer Neg Hx   . Rectal cancer Neg Hx   . Stomach cancer Neg Hx      Immunization History  Administered Date(s) Administered  . Fluad Quad(high Dose 65+) 09/16/2019  . H1N1 01/07/2009  . Influenza Whole 09/23/2012  . Influenza, High Dose Seasonal PF 10/11/2017  . Influenza,inj,Quad PF,6+ Mos 09/28/2015, 08/02/2016, 09/04/2018  . Influenza-Unspecified 08/25/2014, 09/06/2020  . PFIZER(Purple Top)SARS-COV-2 Vaccination 01/09/2020, 02/11/2020  . Pneumococcal Conjugate-13  02/18/2014  . Pneumococcal Polysaccharide-23 10/22/2012  . Td 10/22/2012  . Zoster Recombinat (Shingrix) 04/01/2019  . Zoster, Live 11/28/2012    Outpatient Encounter Medications as of 05/26/2021  Medication Sig  . amLODipine (NORVASC) 5 MG tablet Take 1 tablet (5 mg total) by mouth daily.  Marland Kitchen azelastine (ASTELIN) 0.1 % nasal spray Place 1 spray into both nostrils 2 (two) times daily as needed for rhinitis or allergies. Use in each nostril as directed  . buPROPion (WELLBUTRIN) 75 MG tablet Take 1 tablet (75 mg total) by mouth 2 (two) times daily.  . calcium carbonate (OSCAL) 1500 (600 Ca) MG TABS tablet Take by mouth  daily with breakfast.  . Carboxymethylcellul-Glycerin (LUBRICATING EYE DROPS OP) Place 1 drop into both eyes daily as needed (dry eyes).  Marland Kitchen ipratropium (ATROVENT) 0.03 % nasal spray Place 2 sprays into both nostrils every 12 (twelve) hours.  . meloxicam (MOBIC) 7.5 MG tablet Take 1 tablet (7.5 mg total) by mouth daily.  . Multiple Vitamin (MULTIVITAMIN) tablet Take 1 tablet by mouth daily.  . rosuvastatin (CRESTOR) 5 MG tablet Take 1 tablet (5 mg total) by mouth daily.  Marland Kitchen saccharomyces boulardii (FLORASTOR) 250 MG capsule Take 2 capsules (500 mg total) by mouth 2 (two) times daily.  Marland Kitchen triamcinolone (NASACORT) 55 MCG/ACT AERO nasal inhaler Place 2 sprays into the nose daily.  . Vitamin D, Ergocalciferol, (DRISDOL) 1.25 MG (50000 UNIT) CAPS capsule TAKE 1 CAPSULE BY MOUTH ONCE EVERY MONTH (EVERY  30  (THIRTY)  DAYS)   No facility-administered encounter medications on file as of 05/26/2021.     ROS: Pertinent positives and negatives noted in HPI. Remainder of ROS non-contributory   Allergies  Allergen Reactions  . Ace Inhibitors Cough  . Codeine Nausea And Vomiting  . Lexapro [Escitalopram] Diarrhea and Other (See Comments)    headache    BP (!) 146/96 (BP Location: Left Arm, Patient Position: Sitting, Cuff Size: Normal)   Pulse 88   Temp 98.2 F (36.8 C) (Temporal)    Ht 5' (1.524 m)   Wt 153 lb (69.4 kg)   LMP 12/25/1986 (Approximate)   SpO2 99%   BMI 29.88 kg/m   Wt Readings from Last 3 Encounters:  05/26/21 153 lb (69.4 kg)  01/13/21 154 lb 6.4 oz (70 kg)  10/07/20 155 lb 3.2 oz (70.4 kg)   Temp Readings from Last 3 Encounters:  05/26/21 98.2 F (36.8 C) (Temporal)  01/13/21 97.8 F (36.6 C) (Temporal)  08/03/20 97.8 F (36.6 C) (Tympanic)   BP Readings from Last 3 Encounters:  05/26/21 (!) 146/96  01/13/21 136/80  10/07/20 134/88   Pulse Readings from Last 3 Encounters:  05/26/21 88  01/13/21 70  10/07/20 76     Physical Exam Constitutional:      General: She is not in acute distress.    Appearance: Normal appearance. She is not ill-appearing.  Abdominal:     General: Bowel sounds are normal. There is no distension.     Palpations: Abdomen is soft. There is no mass.     Tenderness: There is no abdominal tenderness. There is no guarding or rebound.  Musculoskeletal:     Right hip: Tenderness present. No deformity. Decreased range of motion. Normal strength.     Right lower leg: No edema.     Left lower leg: No edema.  Neurological:     Mental Status: She is alert and oriented to person, place, and time.  Psychiatric:        Behavior: Behavior normal.      A/P:  1. Irritable bowel syndrome with constipation 2. Abdominal bloating - chronic issue - saw GI Dr. Henrene Pastor in 01/2020 and had normal CT abd/pelvis, EGD, colonoscopy at that time Rx: - dicyclomine (BENTYL) 10 MG capsule; Take 1 capsule (10 mg total) by mouth 4 (four) times daily -  before meals and at bedtime.  Dispense: 120 capsule; Refill: 0 - if no/minimal improvement in 3-4wks, f/u with GI Dr. Henrene Pastor  3. Right groin pain 4. Primary osteoarthritis of right hip - xray in 12/2020 showed severe osteoarthritis changes in Rt hip - pt had improvement with meloxicam but stopped  taking and now takes very PRN - her son was diagnosed with PE last weekend and pt states  clot came from his hip. She is very, very concerned her symptoms are d/t a blood clot. explained that this is unlikely as pt has has pain for years, xray shows severe OA, and she had relief with meloxicam. I do not feel vascular studies or hypercoag work-up is warranted but agreed to check d-dimer d/t pts persistence and level of concern - D-Dimer, Quantitative Refill: - meloxicam (MOBIC) 7.5 MG tablet; Take 1 tablet (7.5 mg total) by mouth daily.  Dispense: 90 tablet; Refill: 1 - pt to take with food. Can increase to 15mg  daily if no/minimal improvement in 2 wks     This visit occurred during the SARS-CoV-2 public health emergency.  Safety protocols were in place, including screening questions prior to the visit, additional usage of staff PPE, and extensive cleaning of exam room while observing appropriate contact time as indicated for disinfecting solutions.

## 2021-05-27 ENCOUNTER — Encounter: Payer: Self-pay | Admitting: Family Medicine

## 2021-05-27 LAB — D-DIMER, QUANTITATIVE: D-Dimer, Quant: 0.9 mcg/mL FEU — ABNORMAL HIGH (ref <0.50)

## 2021-06-02 ENCOUNTER — Encounter: Payer: Self-pay | Admitting: Family Medicine

## 2021-06-02 DIAGNOSIS — M1611 Unilateral primary osteoarthritis, right hip: Secondary | ICD-10-CM

## 2021-06-07 ENCOUNTER — Telehealth: Payer: Self-pay

## 2021-06-07 ENCOUNTER — Telehealth: Payer: Self-pay | Admitting: Physical Medicine and Rehabilitation

## 2021-06-07 ENCOUNTER — Other Ambulatory Visit: Payer: Self-pay

## 2021-06-07 ENCOUNTER — Ambulatory Visit: Payer: Medicare Other | Admitting: Orthopedic Surgery

## 2021-06-07 DIAGNOSIS — M1611 Unilateral primary osteoarthritis, right hip: Secondary | ICD-10-CM

## 2021-06-07 DIAGNOSIS — M25551 Pain in right hip: Secondary | ICD-10-CM

## 2021-06-07 NOTE — Telephone Encounter (Signed)
Patient called needing an appointment with Dr Ernestina Patches. The number to contact patient is [phone]

## 2021-06-07 NOTE — Telephone Encounter (Signed)
Can you make her an appt with Dr. Ninfa Linden for her hip in about a month please?

## 2021-06-08 ENCOUNTER — Telehealth: Payer: Self-pay

## 2021-06-08 NOTE — Telephone Encounter (Signed)
Patient called she is ready to schedule her appointment with Dr.Newton call back:[phone]

## 2021-06-13 DIAGNOSIS — G4733 Obstructive sleep apnea (adult) (pediatric): Secondary | ICD-10-CM | POA: Diagnosis not present

## 2021-06-14 ENCOUNTER — Other Ambulatory Visit: Payer: Self-pay

## 2021-06-14 ENCOUNTER — Ambulatory Visit: Payer: Medicare Other | Admitting: Physical Medicine and Rehabilitation

## 2021-06-14 ENCOUNTER — Encounter: Payer: Self-pay | Admitting: Physical Medicine and Rehabilitation

## 2021-06-14 ENCOUNTER — Ambulatory Visit: Payer: Self-pay

## 2021-06-14 DIAGNOSIS — M25551 Pain in right hip: Secondary | ICD-10-CM | POA: Diagnosis not present

## 2021-06-14 NOTE — Progress Notes (Signed)
Pt state right hip pain that travels down her right leg. Pt state she fell on Saturday but it wasn't because her leg. Pt state walking, standing and sitting makes the pain worse. Pt state she takes pain meds to help ease her pain.  Numeric Pain Rating Scale and Functional Assessment Average Pain 4   In the last MONTH (on 0-10 scale) has pain interfered with the following?  1. General activity like being  able to carry out your everyday physical activities such as walking, climbing stairs, carrying groceries, or moving a chair?  Rating(9)   -BT, -Dye Allergies.

## 2021-06-14 NOTE — Progress Notes (Signed)
   Gloria Werner - 80 y.o. female MRN [id number]  Date of birth: 1941-04-27  Office Visit Note: Visit Date: 06/14/2021 PCP: Ronnald Nian, DO Referred by: Ronnald Nian, DO  Subjective: Chief Complaint  Patient presents with   Right Hip - Pain   Right Leg - Pain   HPI:  Gloria Werner is a 80 y.o. female who comes in today at the request of Dr. Meridee Score for planned Right anesthetic hip arthrogram with fluoroscopic guidance.  The patient has failed conservative care including home exercise, medications, time and activity modification.  This injection will be diagnostic and hopefully therapeutic.  Please see requesting physician notes for further details and justification.   ROS Otherwise per HPI.  Assessment & Plan: Visit Diagnoses:    ICD-10-CM   1. Pain in right hip  M25.551 Large Joint Inj: R hip joint    XR C-ARM NO REPORT      Plan: No additional findings.   Meds & Orders: No orders of the defined types were placed in this encounter.   Orders Placed This Encounter  Procedures   Large Joint Inj: R hip joint   XR C-ARM NO REPORT    Follow-up: Return for visit to requesting physician as needed.   Procedures: Large Joint Inj: R hip joint on 06/14/2021 1:00 PM Indications: diagnostic evaluation and pain Details: 22 G 3.5 in needle, fluoroscopy-guided anterior approach  Arthrogram: No  Medications: 4 mL bupivacaine 0.25 %; 60 mg triamcinolone acetonide 40 MG/ML Outcome: tolerated well, no immediate complications  There was excellent flow of contrast producing a partial arthrogram of the hip. The patient did have relief of symptoms during the anesthetic phase of the injection. Procedure, treatment alternatives, risks and benefits explained, specific risks discussed. Consent was given by the patient. Immediately prior to procedure a time out was called to verify the correct patient, procedure, equipment, support staff and site/side marked as required.  Patient was prepped and draped in the usual sterile fashion.         Clinical History: EXAM: DG PELVIS AND BILATERAL HIPS: 3 V   COMPARISON:  None.   FINDINGS: Frontal pelvis as well as lateral view of each hip joint obtained. There is no appreciable fracture or dislocation. There is moderately severe narrowing of the right hip joint. There is milder narrowing of the left hip joint. Sacroiliac joints appear normal. No erosive change.   IMPRESSION: Osteoarthritic change in each hip joint, considerably more severe on the right than the left. No fracture or dislocation. No avascular necrosis is appreciable by radiography.     Electronically Signed   By: Lowella Grip III M.D.   On: 01/13/2021 15:11     Objective:  VS:  HT:    WT:   BMI:     BP:   HR: bpm  TEMP: ( )  RESP:  Physical Exam   Imaging: XR C-ARM NO REPORT  Result Date: 06/14/2021 Please see Notes tab for imaging impression.

## 2021-06-15 MED ORDER — TRIAMCINOLONE ACETONIDE 40 MG/ML IJ SUSP
60.0000 mg | INTRAMUSCULAR | Status: AC | PRN
  Administered 2021-06-14: 13:00:00 60 mg via INTRA_ARTICULAR

## 2021-06-15 MED ORDER — BUPIVACAINE HCL 0.25 % IJ SOLN
4.0000 mL | INTRAMUSCULAR | Status: AC | PRN
  Administered 2021-06-14: 13:00:00 4 mL via INTRA_ARTICULAR

## 2021-06-17 ENCOUNTER — Encounter: Payer: Self-pay | Admitting: Orthopedic Surgery

## 2021-06-17 NOTE — Progress Notes (Signed)
Office Visit Note   Patient: Gloria Werner           Date of Birth: 05/14/1941           MRN: [id number] Visit Date: 06/07/2021              Requested by: Ronnald Nian, DO [place],  [place] [address] PCP: Ronnald Nian, DO  Chief Complaint  Patient presents with   Right Hip - Pain      HPI: Patient is a 80 year old woman who presents for initial evaluation for right hip pain.  She states she has had pain for 3 to 4 years and is slowly getting worse she states she cannot cross her legs due to pain.  Assessment & Plan: Visit Diagnoses:  1. Unilateral primary osteoarthritis, right hip     Plan: We will set her up for a injection of her right hip to give her some temporary relief and will have her follow-up with Dr. Ninfa Linden for evaluation for total hip arthroplasty.  Follow-Up Instructions: Return if symptoms worsen or fail to improve, for Plan to follow-up with Dr. Ninfa Linden for evaluation for total hip arthroplasty.   Ortho Exam  Patient is alert, oriented, no adenopathy, well-dressed, normal affect, normal respiratory effort. Examination patient has an abductor lurch.  She has good range of motion of her left hip right hip has internal rotation of 0 degrees external rotation of 20 degrees.  Review of her radiographs from January shows subcondylar cysts and sclerotic changes of the right hip with essentially bone-on-bone contact.  Imaging: No results found. No images are attached to the encounter.  Labs: Lab Results  Component Value Date   REPTSTATUS 10/15/2018 FINAL 10/14/2018   CULT (A) 10/14/2018    20,000 COLONIES/mL GROUP B STREP(S.AGALACTIAE)ISOLATED TESTING AGAINST S. AGALACTIAE NOT ROUTINELY PERFORMED DUE TO PREDICTABILITY OF AMP/PEN/VAN SUSCEPTIBILITY. Performed at [facility], [place] [address], [place], [place] [address]    LABORGA NO GROWTH 01/17/2017     Lab Results  Component Value Date   ALBUMIN 4.4  08/03/2020   ALBUMIN 4.5 12/29/2019   ALBUMIN 4.4 09/29/2019    Lab Results  Component Value Date   MG 2.2 02/18/2014   Lab Results  Component Value Date   VD25OH 33.36 08/03/2020   VD25OH 35.31 09/29/2019   VD25OH 31.15 08/13/2017    No results found for: PREALBUMIN CBC EXTENDED Latest Ref Rng & Units 08/03/2020 09/29/2019 10/17/2018  WBC 4.0 - 10.5 K/uL 5.5 4.2 -  RBC 3.87 - 5.11 Mil/uL 4.54 4.59 -  HGB 12.0 - 15.0 g/dL 13.7 13.7 10.8(L)  HCT 36.0 - 46.0 % 40.9 41.3 34.7(L)  PLT 150.0 - 400.0 K/uL 182.0 197.0 -  NEUTROABS 1.4 - 7.7 K/uL - 2.5 -  LYMPHSABS 0.7 - 4.0 K/uL - 1.2 -     There is no height or weight on file to calculate BMI.  Orders:  No orders of the defined types were placed in this encounter.  No orders of the defined types were placed in this encounter.    Procedures: No procedures performed  Clinical Data: No additional findings.  ROS:  All other systems negative, except as noted in the HPI. Review of Systems  Objective: Vital Signs: LMP 12/25/1986 (Approximate)   Specialty Comments:  No specialty comments available.  PMFS History: Patient Active Problem List   Diagnosis Date Noted   Medication side effect 08/03/2020   DDD (degenerative disc disease), lumbar  09/28/2019   Aortic atherosclerosis ([place]) 09/28/2019   Rib pain 09/16/2019   Right lumbar pain 09/16/2019   OSA (obstructive sleep apnea) 01/27/2019   Pulmonary nodule 01/27/2019   Allergic rhinitis 11/06/2018   Cough 09/24/2018   GERD (gastroesophageal reflux disease) 09/04/2018   De Quervain's syndrome (tenosynovitis) 07/03/2018   Essential hypertension 06/03/2018   Seasonal affective disorder ([place]) 01/17/2017   Vitamin D deficiency 88/91/6945   Lichen simplex chronicus 04/08/2015   Osteopenia 04/08/2015   Depression 03/05/2015   ALLERGIC RHINITIS, SEASONAL 02/03/2008   IRRITABLE BOWEL SYNDROME 02/03/2008   Past Medical History:  Diagnosis Date   Allergic rhinitis due  to pollen    Cataract    NS OU   Complication of anesthesia    Cough    Gastroesophageal reflux disease without esophagitis    Irritable bowel syndrome    Lichen simplex chronicus 10/05   PONV (postoperative nausea and vomiting)     Family History  Problem Relation Age of Onset   Emphysema Mother    Lymphoma Mother    Asthma Mother    Cancer Father 3       lung cancer   Coronary artery disease Brother    Cancer Brother        breast cancer   Breast cancer Brother    Cancer Brother        lung cancer   Diabetes Neg Hx    Hypertension Neg Hx    Colon cancer Neg Hx    Esophageal cancer Neg Hx    Rectal cancer Neg Hx    Stomach cancer Neg Hx     Past Surgical History:  Procedure Laterality Date   ABDOMINAL HYSTERECTOMY  1988   secondary to prolapse   BLADDER SUSPENSION     A-P with Hyst   INSERTION OF MESH  10/16/2018   Procedure: INSERTION OF MESH;  Surgeon: Ardis Hughs, MD;  Location: [facility];  Service: Urology;;   NASAL SINUS SURGERY  1970   ROBOTIC ASSISTED LAPAROSCOPIC SACROCOLPOPEXY N/A 10/16/2018   Procedure: XI ROBOTIC ASSISTED LAPAROSCOPIC SACROCOLPOPEXY;  Surgeon: Ardis Hughs, MD;  Location: [facility];  Service: Urology;  Laterality: N/A;   TONSILLECTOMY     Social History   Occupational History   Occupation: Environmental health practitioner  Tobacco Use   Smoking status: Never   Smokeless tobacco: Never  Vaping Use   Vaping Use: Never used  Substance and Sexual Activity   Alcohol use: No   Drug use: No   Sexual activity: Not Currently    Partners: Male    Birth control/protection: Surgical    Comment: hysterectomy

## 2021-07-07 ENCOUNTER — Ambulatory Visit: Payer: Medicare Other | Admitting: Orthopaedic Surgery

## 2021-07-20 DIAGNOSIS — Z08 Encounter for follow-up examination after completed treatment for malignant neoplasm: Secondary | ICD-10-CM | POA: Diagnosis not present

## 2021-07-20 DIAGNOSIS — L570 Actinic keratosis: Secondary | ICD-10-CM | POA: Diagnosis not present

## 2021-07-20 DIAGNOSIS — Z85828 Personal history of other malignant neoplasm of skin: Secondary | ICD-10-CM | POA: Diagnosis not present

## 2021-07-20 DIAGNOSIS — X32XXXD Exposure to sunlight, subsequent encounter: Secondary | ICD-10-CM | POA: Diagnosis not present

## 2021-08-04 ENCOUNTER — Encounter: Payer: Self-pay | Admitting: Orthopaedic Surgery

## 2021-08-04 ENCOUNTER — Ambulatory Visit (INDEPENDENT_AMBULATORY_CARE_PROVIDER_SITE_OTHER): Payer: Medicare Other | Admitting: Orthopaedic Surgery

## 2021-08-04 DIAGNOSIS — M1611 Unilateral primary osteoarthritis, right hip: Secondary | ICD-10-CM | POA: Diagnosis not present

## 2021-08-04 HISTORY — DX: Unilateral primary osteoarthritis, right hip: M16.11

## 2021-08-04 NOTE — Progress Notes (Signed)
Office Visit Note   Patient: Gloria Werner           Date of Birth: 18-Aug-1941           MRN: [id number] Visit Date: 08/04/2021              Requested by: Ronnald Nian, DO No address on file PCP: Ronnald Nian, DO   Assessment & Plan: Visit Diagnoses:  1. Unilateral primary osteoarthritis, right hip     Plan: I did speak to her in length about the recommendation for hip replacement surgery.  We went over her x-rays and a hip replacement model.  I even gave her handout about hip replacements.  We discussed the risks and benefits of surgery.  We talked about what to expect with her interoperative and postoperative course.  All questions and concerns were answered and addressed.  She is interested in having this scheduled at the end of mowing season.  We will be in touch.  Follow-Up Instructions: Return for 2 weeks post-op.   Orders:  No orders of the defined types were placed in this encounter.  No orders of the defined types were placed in this encounter.     Procedures: No procedures performed   Clinical Data: No additional findings.   Subjective: Chief Complaint  Patient presents with   Right Hip - Pain  The patient is a very pleasant 80 year old female that is referred to me by Dr. Sharol Given to evaluate and treat severe osteoarthritis of her right hip.  She is a very active individual and does enjoy mowing her lawn.  She is interested in a right hip replacement after the mowing season may be early October.  She has well-documented end-stage arthritis of her right hip.  She has tried and failed all forms of conservative treatment for over a year and is at least 2 intra-articular steroid injections in the right hip.  The last one was done in mid June and is worn off.  Her right hip pain is daily and is detrimentally affecting her mobility, her quality of life and actives daily living.  She has not a diabetic and is very active.  She does not walk with assistive  ice.  HPI  Review of Systems She currently denies any headache, chest pain, shortness of breath, fever, chills, nausea, vomiting  Objective: Vital Signs: LMP 12/25/1986 (Approximate)   Physical Exam She is alert and orient x3 and in no acute distress Ortho Exam On examination when she does get up she walks with a limp favoring her right side.  She has significant stiffness with internal and external rotation of the right hip and pain in the groin with rotation of the right hip.  Her left hip exam is normal. Specialty Comments:  No specialty comments available.  Imaging: No results found. An AP pelvis and lateral of the right hip from previous x-ray shows severe end-stage arthritis of the right hip with joint space narrowing, sclerotic changes in the femoral head and acetabulum as well as para-articular osteophytes.  The joint space on the left hip is well-maintained.  PMFS History: Patient Active Problem List   Diagnosis Date Noted   Unilateral primary osteoarthritis, 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   Medication side effect 08/03/2020   DDD (degenerative disc disease), lumbar 09/28/2019   Aortic atherosclerosis ([place]) 09/28/2019   Rib pain 09/16/2019   Right lumbar pain 09/16/2019   OSA (obstructive sleep apnea) 01/27/2019   Pulmonary nodule 01/27/2019   Allergic  rhinitis 11/06/2018   Cough 09/24/2018   GERD (gastroesophageal reflux disease) 09/04/2018   De Quervain's syndrome (tenosynovitis) 07/03/2018   Essential hypertension 06/03/2018   Seasonal affective disorder ([place]) 01/17/2017   Vitamin D deficiency 0000000   Lichen simplex chronicus 04/08/2015   Osteopenia 04/08/2015   Depression 03/05/2015   ALLERGIC RHINITIS, SEASONAL 02/03/2008   IRRITABLE BOWEL SYNDROME 02/03/2008   Past Medical History:  Diagnosis Date   Allergic rhinitis due to pollen    Cataract    NS OU   Complication of anesthesia    Cough    Gastroesophageal reflux disease without esophagitis     Irritable bowel syndrome    Lichen simplex chronicus 10/05   PONV (postoperative nausea and vomiting)     Family History  Problem Relation Age of Onset   Emphysema Mother    Lymphoma Mother    Asthma Mother    Cancer Father 74       lung cancer   Coronary artery disease Brother    Cancer Brother        breast cancer   Breast cancer Brother    Cancer Brother        lung cancer   Diabetes Neg Hx    Hypertension Neg Hx    Colon cancer Neg Hx    Esophageal cancer Neg Hx    Rectal cancer Neg Hx    Stomach cancer Neg Hx     Past Surgical History:  Procedure Laterality Date   ABDOMINAL HYSTERECTOMY  1988   secondary to prolapse   BLADDER SUSPENSION     A-P with Hyst   INSERTION OF MESH  10/16/2018   Procedure: INSERTION OF MESH;  Surgeon: Ardis Hughs, MD;  Location: [facility];  Service: Urology;;   NASAL SINUS SURGERY  1970   ROBOTIC ASSISTED LAPAROSCOPIC SACROCOLPOPEXY N/A 10/16/2018   Procedure: XI ROBOTIC ASSISTED LAPAROSCOPIC SACROCOLPOPEXY;  Surgeon: Ardis Hughs, MD;  Location: [facility];  Service: Urology;  Laterality: N/A;   TONSILLECTOMY     Social History   Occupational History   Occupation: Environmental health practitioner  Tobacco Use   Smoking status: Never   Smokeless tobacco: Never  Vaping Use   Vaping Use: Never used  Substance and Sexual Activity   Alcohol use: No   Drug use: No   Sexual activity: Not Currently    Partners: Male    Birth control/protection: Surgical    Comment: hysterectomy

## 2021-08-11 ENCOUNTER — Telehealth: Payer: Self-pay | Admitting: Orthopaedic Surgery

## 2021-08-11 NOTE — Telephone Encounter (Signed)
Pt called would like sherri to call her.   Cb [phone]

## 2021-08-15 ENCOUNTER — Telehealth: Payer: Self-pay

## 2021-08-15 NOTE — Telephone Encounter (Signed)
I called patient and scheduled surgery. 

## 2021-08-16 ENCOUNTER — Ambulatory Visit (INDEPENDENT_AMBULATORY_CARE_PROVIDER_SITE_OTHER): Payer: Medicare Other

## 2021-08-16 ENCOUNTER — Other Ambulatory Visit: Payer: Self-pay

## 2021-08-16 ENCOUNTER — Encounter: Payer: Self-pay | Admitting: Nurse Practitioner

## 2021-08-16 ENCOUNTER — Ambulatory Visit: Payer: Medicare Other | Admitting: Internal Medicine

## 2021-08-16 ENCOUNTER — Encounter: Payer: Self-pay | Admitting: Internal Medicine

## 2021-08-16 ENCOUNTER — Ambulatory Visit: Payer: Medicare Other | Admitting: Nurse Practitioner

## 2021-08-16 VITALS — BP 130/70 | HR 81 | Ht 60.0 in | Wt 153.0 lb

## 2021-08-16 DIAGNOSIS — R140 Abdominal distension (gaseous): Secondary | ICD-10-CM | POA: Diagnosis not present

## 2021-08-16 DIAGNOSIS — K5900 Constipation, unspecified: Secondary | ICD-10-CM | POA: Diagnosis not present

## 2021-08-16 DIAGNOSIS — R058 Other specified cough: Secondary | ICD-10-CM

## 2021-08-16 DIAGNOSIS — R109 Unspecified abdominal pain: Secondary | ICD-10-CM

## 2021-08-16 MED ORDER — LINACLOTIDE 72 MCG PO CAPS: 72.0000 ug | ORAL_CAPSULE | Freq: Every day | ORAL | 2 refills | Status: DC

## 2021-08-16 NOTE — Patient Instructions (Addendum)
If you are age 78 or older, your body mass index should be between 23-30. Your Body mass index is 29.88 kg/m. If this is out of the aforementioned range listed, please consider follow up with your Primary Care Provider.   The [facility] providers would like to encourage you to use [facility] to communicate with providers for non-urgent requests or questions.  Due to long hold times on the telephone, sending your provider a message by [facility] may be faster and more efficient way to get a response. Please allow 48 business hours for a response.  Please remember that this is for non-urgent requests/questions.  MEDICATION: We have sent the following medication to your pharmacy for you to pick up at your convenience: Linzess 72 mcg, take 1 daily 30 minutes before breakfast.   RECOMMENDATIONS: You can take Miralax if needed at bedtime. IBgard 1 capsule twice a day as needed for abdominal pain.gas/bloating. We have given you some samples today to try.  Follow up in 3 months. Please call our office if your symptoms worsen.  It was great seeing you today! Thank you for entrusting me with your care and choosing [facility].  Noralyn Pick, CRNP

## 2021-08-16 NOTE — Patient Instructions (Signed)
GERD (REFLUX)  is an extremely common cause of respiratory symptoms just like yours , many times with no obvious heartburn at all.    It can be treated with medication, but also with lifestyle changes including elevation of the head of your bed (ideally with 6 -8inch blocks under the headboard of your bed),  Smoking cessation, avoidance of late meals, excessive alcohol, and avoid fatty foods, chocolate, peppermint, colas, red wine, and acidic juices such as orange juice.  NO MINT OR MENTHOL PRODUCTS SO NO COUGH DROPS  USE SUGARLESS CANDY INSTEAD (Jolley ranchers or Stover's or Life Savers) or even ice chips will also do - the key is to swallow to prevent all throat clearing. NO OIL BASED VITAMINS - use powdered substitutes.  Avoid fish oil when coughing.   Please remember to go to the  x-ray department  for your tests - we will call you with the results when they are available     If not happy with results the next step would be to see Dr Carol Ada

## 2021-08-16 NOTE — Progress Notes (Signed)
Gloria Werner, female    DOB: 09-19-1941,     MRN: [id number]   Brief patient profile:  89 yowf never smoker but bronchitis as child in house with smokers self referred to pulmonary clinic 08/16/2021 for second opinion re cough onset May 2019 already eval by Allergy Neldon Mc) then Pulmonary ([name]) refractory to gerd  rx and symbicort but this may have been while still on ACEi  Onset while on lisinopril stopped 07/09/2019     History of Present Illness  08/16/2021  Pulmonary/ 1st office eval/Markhi Kleckner  Chief Complaint  Patient presents with   Follow-up    Patient reports has some improvement with productive cough,.   Dyspnea:  Not limited by breathing from desired activities   Cough: pattern mostly day sporadic/worse with voice use, uses mints and cough drops  Sleep: not bothered by cough /mouth wakes up dry p cpap started  SABA use: none   No obvious other patterns day to day or daytime variability or assoc excess/ purulent sputum or mucus plugs or hemoptysis or cp or chest tightness, subjective wheeze or overt sinus or hb symptoms.   Sleeps as above without nocturnal  or early am exacerbation  of respiratory  c/o's or need for noct saba. Also denies any obvious fluctuation of symptoms with weather or environmental changes or other aggravating or alleviating factors except as outlined above   No unusual exposure hx or h/o childhood pna/ asthma or knowledge of premature birth.  Current Allergies, Complete Past Medical History, Past Surgical History, Family History, and Social History were reviewed in [employer] record.  ROS  The following are not active complaints unless bolded Hoarseness, sore throat, dysphagia, dental problems, itching, sneezing,  nasal congestion or discharge of excess mucus or purulent secretions, ear ache,   fever, chills, sweats, unintended wt loss or wt gain, classically pleuritic or exertional cp,  orthopnea pnd or arm/hand swelling  or  leg swelling, presyncope, palpitations, abdominal pain, anorexia, nausea, vomiting, diarrhea  or change in bowel habits or change in bladder habits, change in stools or change in urine, dysuria, hematuria,  rash, arthralgias, visual complaints, headache, numbness, weakness or ataxia or problems with walking or coordination,  change in mood or  memory.              Past Medical History:  Diagnosis Date   Allergic rhinitis due to pollen    Cataract    NS OU   Complication of anesthesia    Cough    Gastroesophageal reflux disease without esophagitis    Irritable bowel syndrome    Lichen simplex chronicus 10/05   PONV (postoperative nausea and vomiting)     Outpatient Medications Prior to Visit  Medication Sig Dispense Refill   amLODipine (NORVASC) 5 MG tablet Take 1 tablet (5 mg total) by mouth daily. 90 tablet 3   azelastine (ASTELIN) 0.1 % nasal spray Place 1 spray into both nostrils 2 (two) times daily as needed for rhinitis or allergies. Use in each nostril as directed 30 mL 11   buPROPion (WELLBUTRIN) 75 MG tablet Take 1 tablet (75 mg total) by mouth 2 (two) times daily. 180 tablet 3   calcium carbonate (OSCAL) 1500 (600 Ca) MG TABS tablet Take by mouth daily with breakfast.     Carboxymethylcellul-Glycerin (LUBRICATING EYE DROPS OP) Place 1 drop into both eyes daily as needed (dry eyes).     dicyclomine (BENTYL) 10 MG capsule Take 1 capsule (10 mg total) by mouth  4 (four) times daily -  before meals and at bedtime. 120 capsule 0   linaclotide (LINZESS) 72 MCG capsule Take 1 capsule (72 mcg total) by mouth daily before breakfast. 30 capsule 2   meloxicam (MOBIC) 7.5 MG tablet Take 1 tablet (7.5 mg total) by mouth daily. 90 tablet 1   Multiple Vitamin (MULTIVITAMIN) tablet Take 1 tablet by mouth daily.     rosuvastatin (CRESTOR) 5 MG tablet Take 1 tablet (5 mg total) by mouth daily. 90 tablet 3   saccharomyces boulardii (FLORASTOR) 250 MG capsule Take 2 capsules (500 mg total) by  mouth 2 (two) times daily. 120 capsule 3   Vitamin D, Ergocalciferol, (DRISDOL) 1.25 MG (50000 UNIT) CAPS capsule TAKE 1 CAPSULE BY MOUTH ONCE EVERY MONTH (EVERY  30  (THIRTY)  DAYS) 3 capsule 1   ipratropium (ATROVENT) 0.03 % nasal spray Place 2 sprays into both nostrils every 12 (twelve) hours. (Patient not taking: Reported on 08/16/2021) 30 mL 11   No facility-administered medications prior to visit.     Objective:     BP 118/78 (BP Location: Left Arm, Patient Position: Sitting, Cuff Size: Large)   Pulse 74   Temp 98.3 F (36.8 C) (Oral)   Ht 5' (1.524 m)   Wt 153 lb (69.4 kg)   LMP 12/25/1986 (Approximate)   SpO2 96% Comment: ra  BMI 29.88 kg/m   SpO2: 96 % (ra)  Amb pleasant wf with no spont cough   HEENT : pt wearing mask not removed for exam due to covid -19 concerns.    NECK :  without JVD/Nodes/TM/ nl carotid upstrokes bilaterally   LUNGS: no acc muscle use,  Nl contour chest which is clear to A and P bilaterally without cough on insp or exp maneuvers   CV:  RRR  no s3 or murmur or increase in P2, and no edema   ABD:  soft and nontender with nl inspiratory excursion in the supine position. No bruits or organomegaly appreciated, bowel sounds nl  MS:  Nl gait/ ext warm without deformities, calf tenderness, cyanosis or clubbing No obvious joint restrictions   SKIN: warm and dry without lesions    NEURO:  alert, approp, nl sensorium with  no motor or cerebellar deficits apparent.    CXR PA and Lateral:   08/16/2021 :    I personally reviewed images / radiology impression as follows:    Mild t kyphosis, no acute findings        Assessment   Upper airway cough syndrome Onset May 2019 while on ACEi d/c'd 07/09/2019 with neg resp to rx for gerd or rhinitis or asthma  - max gerd diet rec 08/16/2021  With f/u Dr Bettina Gavia at [facility] voice center prn   Cough is presently tolerable and she was not enthusiastic for either rechallenging with GERD RX (not clear if that  was done while still on acei) or trial of gabapentin though usually we add the gerd rx first  So for now max non-drug rx for gerd to most importantly include non-mint/mentthol hard rock candy and dietary restrictions and reassurance that there is no likelihood at all of a pulmonary source for the cough.   F/u here can be prn           Each maintenance medication was reviewed in detail including emphasizing most importantly the difference between maintenance and prns and under what circumstances the prns are to be triggered using an action plan format where appropriate.  Total  time for H and P, chart review, counseling,  and generating customized AVS unique to this office visit / same day charting = 2mn           MChristinia Gully MD 08/16/2021

## 2021-08-16 NOTE — Progress Notes (Signed)
Noted  

## 2021-08-16 NOTE — Progress Notes (Signed)
08/16/2021 Gloria Werner [id number] [birth year]   Chief Complaint: Abdominal bloat, constipation   History of Present Illness: Gloria Werner is a 80 year old female with a past medical history of chronic cough, GERD, chronic constipation and colon polyps.  She presents today for further evaluation regarding chronic constipation and abdominal bloat.  She was last seen in the office by Dr. Henrene Pastor 06/24/2020 for follow-up regarding constipation and abdominal bloat.  She was prescribed Citrucel which she did not initiate.  She took Linzess 145 mcg daily for several weeks which resulted in excessive diarrhea and fecal incontinence so she stopped taking it.  She underwent an EGD 02/18/2020 which was normal.  A colonoscopy was done on the same date which showed severe diverticulosis throughout the colon otherwise was normal.  A CTAP 02/04/2020 showed prominent stool throughout the colon and diverticulosis without evidence of acute diverticulitis.  She continues to have abdominal bloat and constipation.  She is passing 2-3 small formed brown bowel movements daily.  She does not feel emptied.  She takes MiraLAX nightly.  She has intermittent right and left mid abdominal pains which occurs for few days every few weeks and goes away when she stands up and moves around.  She denies having any abdominal pain at this time.  She often feels full easily which is pronounced when she is active abdominal bloat.  No nausea or vomiting.  No weight loss.  No fever.  No GERD symptoms.  No other complaints at this time.  CBC Latest Ref Rng & Units 08/03/2020 09/29/2019 10/17/2018  WBC 4.0 - 10.5 K/uL 5.5 4.2 -  Hemoglobin 12.0 - 15.0 g/dL 13.7 13.7 10.8(L)  Hematocrit 36.0 - 46.0 % 40.9 41.3 34.7(L)  Platelets 150.0 - 400.0 K/uL 182.0 197.0 -    CMP Latest Ref Rng & Units 08/03/2020 12/29/2019 09/29/2019  Glucose 70 - 99 mg/dL 92 105(H) 95  BUN 6 - 23 mg/dL '13 16 20  '$ Creatinine 0.40 - 1.20 mg/dL 0.86 0.78 0.82   Sodium 135 - 145 mEq/L 140 139 141  Potassium 3.5 - 5.1 mEq/L 4.1 4.1 4.1  Chloride 96 - 112 mEq/L 104 104 105  CO2 19 - 32 mEq/L '26 26 29  '$ Calcium 8.4 - 10.5 mg/dL 9.5 9.5 9.6  Total Protein 6.0 - 8.3 g/dL 7.0 7.3 6.8  Total Bilirubin 0.2 - 1.2 mg/dL 0.5 0.4 0.4  Alkaline Phos 39 - 117 U/L 54 53 59  AST 0 - 37 U/L '21 27 22  '$ ALT 0 - 35 U/L '16 21 16     '$ EGD 02/18/2020: - Normal esophagus. - Normal stomach. - Normal examined duodenum. - No specimens collected.  Colonoscopy 02/18/2020: - Diverticulosis, severe, in the entire examined colon. - The examination was otherwise normal on direct and retroflexion views. - No specimens collected.  CTAP 02/04/2020: 1. Prominent stool throughout the colon favors constipation. 2. Descending and sigmoid colon diverticulosis without active diverticulitis. 3. Lumbar spondylosis and degenerative disc disease contribute to mild degrees of multilevel foraminal impingement. 4. Calcifications along the posterior leaflet of the mitral valve. 5. Small bibasilar pulmonary nodules are stable from 2011, and considered benign. Mild calcification of the mitral valve.  Aortic Atherosclerosis   Past Medical History:  Diagnosis Date   Allergic rhinitis due to pollen    Cataract    NS OU   Complication of anesthesia    Cough    Gastroesophageal reflux disease without esophagitis    Irritable bowel syndrome  Lichen simplex chronicus 10/05   PONV (postoperative nausea and vomiting)    Current Outpatient Medications on File Prior to Visit  Medication Sig Dispense Refill   amLODipine (NORVASC) 5 MG tablet Take 1 tablet (5 mg total) by mouth daily. 90 tablet 3   azelastine (ASTELIN) 0.1 % nasal spray Place 1 spray into both nostrils 2 (two) times daily as needed for rhinitis or allergies. Use in each nostril as directed 30 mL 11   buPROPion (WELLBUTRIN) 75 MG tablet Take 1 tablet (75 mg total) by mouth 2 (two) times daily. 180 tablet 3   calcium  carbonate (OSCAL) 1500 (600 Ca) MG TABS tablet Take by mouth daily with breakfast.     Carboxymethylcellul-Glycerin (LUBRICATING EYE DROPS OP) Place 1 drop into both eyes daily as needed (dry eyes).     dicyclomine (BENTYL) 10 MG capsule Take 1 capsule (10 mg total) by mouth 4 (four) times daily -  before meals and at bedtime. 120 capsule 0   meloxicam (MOBIC) 7.5 MG tablet Take 1 tablet (7.5 mg total) by mouth daily. 90 tablet 1   Multiple Vitamin (MULTIVITAMIN) tablet Take 1 tablet by mouth daily.     rosuvastatin (CRESTOR) 5 MG tablet Take 1 tablet (5 mg total) by mouth daily. 90 tablet 3   saccharomyces boulardii (FLORASTOR) 250 MG capsule Take 2 capsules (500 mg total) by mouth 2 (two) times daily. 120 capsule 3   Vitamin D, Ergocalciferol, (DRISDOL) 1.25 MG (50000 UNIT) CAPS capsule TAKE 1 CAPSULE BY MOUTH ONCE EVERY MONTH (EVERY  30  (THIRTY)  DAYS) 3 capsule 1   No current facility-administered medications on file prior to visit.   Allergies  Allergen Reactions   Lexapro [Escitalopram] Diarrhea and Other (See Comments)    headache   Ace Inhibitors Cough   Codeine Nausea And Vomiting    Current Medications, Allergies, Past Medical History, Past Surgical History, Family History and Social History were reviewed in [employer] record.  Review of Systems:   Constitutional: Negative for fever, sweats, chills or weight loss.  Respiratory: Negative for shortness of breath.   Cardiovascular: Negative for chest pain, palpitations and leg swelling.  Gastrointestinal: See HPI.  Musculoskeletal: Negative for back pain or muscle aches.  Neurological: Negative for dizziness, headaches or paresthesias.   Physical Exam: LMP 12/25/1986 (Approximate)  BP 130/70   Pulse 81   Ht 5' (1.524 m)   Wt 153 lb (69.4 kg)   LMP 12/25/1986 (Approximate)   BMI 29.88 kg/m   Wt Readings from Last 3 Encounters:  08/16/21 153 lb (69.4 kg)  05/26/21 153 lb (69.4 kg)  01/13/21 154  lb 6.4 oz (70 kg)  General: 80 year old female in no acute distress. Head: Normocephalic and atraumatic. Eyes: No scleral icterus. Conjunctiva pink . Ears: Normal auditory acuity. Mouth: Dentition intact. No ulcers or lesions.  Lungs: Clear throughout to auscultation. Heart: Regular rate and rhythm. Systolic murmur.  Abdomen: Soft, nondistended. Mild RLQ tenderness without rebound or guarding. No masses or hepatomegaly. Normal bowel sounds x 4 quadrants.  Rectal: Deferred.  Musculoskeletal: Symmetrical with no gross deformities. Extremities: No edema. Neurological: Alert oriented x 4. No focal deficits.  Psychological: Alert and cooperative. Normal mood and affect  Assessment and Recommendations:  20) 80 year old female with chronic constipation with associated abdominal bloat.  No weight loss.  Colonoscopy 01/2020 showed diverticulosis without evidence of colon polyps. -She wishes to try low-dose Linzess 72 mcg 1 tab to be taken 30  minutes prior to breakfast.  She will discontinue Linzess if she develops excessive diarrhea +/- fecal incontinence -MiraLAX nightly as needed -IBgard 1 p.o. twice daily for abdominal bloat/pain -I discussed repeat CBC, CMP and CTAP if her abdominal pains persist or worsen.  No abdominal pain at this time. -Follow-up in the office in 2 to 3 months  2) History of colon polyps.  Most recent colonoscopy 01/2020 showed severe diverticulosis throughout the colon without evidence of colon polyps. -No further screening colonoscopies recommended due to age  69) History of GERD, no current GERD symptoms. She takes TUMS PRN for upper abdominal gas pains. Normal EGD 01/2020.

## 2021-08-16 NOTE — Assessment & Plan Note (Signed)
Onset May 2019 while on ACEi d/c'd 07/09/2019 with neg resp to rx for gerd or rhinitis or asthma  - max gerd diet rec 08/16/2021  With f/u Dr Bettina Gavia at [facility] voice center prn   Cough is presently tolerable and she was not enthusiastic for either rechallenging with GERD RX (not clear if that was done while still on acei) or trial of gabapentin though usually we add the gerd rx first  So for now max non-drug rx for gerd to most importantly include non-mint/mentthol hard rock candy and dietary restrictions and reassurance that there is no likelihood at all of a pulmonary source for the cough.   F/u here can be prn           Each maintenance medication was reviewed in detail including emphasizing most importantly the difference between maintenance and prns and under what circumstances the prns are to be triggered using an action plan format where appropriate.  Total time for H and P, chart review, counseling,  and generating customized AVS unique to this office visit / same day charting = 31mn

## 2021-08-17 ENCOUNTER — Encounter: Payer: Self-pay | Admitting: *Deleted

## 2021-08-18 ENCOUNTER — Other Ambulatory Visit: Payer: Self-pay | Admitting: Family Medicine

## 2021-08-18 NOTE — Telephone Encounter (Signed)
Refill request for: Vitamin D 50000 IU LR 01/05/21, #3,1 rf LOV  05/26/21 (Dr Bryan Lemma) [place]  11/16/21 ([name])  Please review and advise.  Thanks. Dm/cma

## 2021-09-06 ENCOUNTER — Telehealth: Payer: Self-pay | Admitting: Family Medicine

## 2021-09-06 NOTE — Progress Notes (Signed)
  Chronic Care Management   Note  09/06/2021 Name: SYLENA RASUL MRN: [id number] DOB: [birth year]  NAVADA LITTLER is a 80 y.o. year old female who is a primary care patient of Ronnald Nian, DO. I reached out to Gearlean Alf by phone today in response to a referral sent by Ms. Garfield Cornea Sadek's PCP, Ronnald Nian, DO.   Ms. Losier was given information about Chronic Care Management services today including:  CCM service includes personalized support from designated clinical staff supervised by her physician, including individualized plan of care and coordination with other care providers 24/7 contact phone numbers for assistance for urgent and routine care needs. Service will only be billed when office clinical staff spend 20 minutes or more in a month to coordinate care. Only one practitioner may furnish and bill the service in a calendar month. The patient may stop CCM services at any time (effective at the end of the month) by phone call to the office staff.   Patient agreed to services and verbal consent obtained.   Follow up plan:   Tatjana Secretary/administrator

## 2021-09-20 ENCOUNTER — Ambulatory Visit (INDEPENDENT_AMBULATORY_CARE_PROVIDER_SITE_OTHER): Payer: Medicare Other | Admitting: *Deleted

## 2021-09-20 ENCOUNTER — Other Ambulatory Visit: Payer: Self-pay

## 2021-09-20 DIAGNOSIS — Z0000 Encounter for general adult medical examination without abnormal findings: Secondary | ICD-10-CM

## 2021-09-20 NOTE — Progress Notes (Signed)
Subjective:   Gloria Werner is a 80 y.o. female who presents for Medicare Annual (Subsequent) preventive examination.  I connected with  Gearlean Alf on 09/20/21 by a telephone enabled telemedicine application and verified that I am speaking with the correct person using two identifiers.   I discussed the limitations of evaluation and management by telemedicine. The patient expressed understanding and agreed to proceed.   Review of Systems     Cardiac Risk Factors include: advanced age (>41men, >20 women);hypertension     Objective:    Today's Vitals   09/20/21 0833 09/20/21 0834  PainSc: 4  4    There is no height or weight on file to calculate BMI.  Advanced Directives 09/14/2020 09/10/2019 01/11/2019 10/16/2018 10/14/2018  Does Patient Have a Medical Advance Directive? Yes Yes Yes Yes Yes  Type of Paramedic of [place];Living will [place];Living will [place];Living will [place];Living will [place];Living will  Does patient want to make changes to medical advance directive? - No - Patient declined No - Patient declined No - Patient declined No - Patient declined  Copy of [place] in Chart? No - copy requested No - copy requested No - copy requested No - copy requested No - copy requested    Current Medications (verified) Outpatient Encounter Medications as of 09/20/2021  Medication Sig   amLODipine (NORVASC) 5 MG tablet Take 1 tablet (5 mg total) by mouth daily.   azelastine (ASTELIN) 0.1 % nasal spray Place 1 spray into both nostrils 2 (two) times daily as needed for rhinitis or allergies. Use in each nostril as directed   buPROPion (WELLBUTRIN) 75 MG tablet Take 1 tablet (75 mg total) by mouth 2 (two) times daily.   calcium carbonate (OSCAL) 1500 (600 Ca) MG TABS tablet Take by mouth daily with breakfast.   Carboxymethylcellul-Glycerin  (LUBRICATING EYE DROPS OP) Place 1 drop into both eyes daily as needed (dry eyes).   dicyclomine (BENTYL) 10 MG capsule Take 1 capsule (10 mg total) by mouth 4 (four) times daily -  before meals and at bedtime.   linaclotide (LINZESS) 72 MCG capsule Take 1 capsule (72 mcg total) by mouth daily before breakfast.   meloxicam (MOBIC) 7.5 MG tablet Take 1 tablet (7.5 mg total) by mouth daily.   Multiple Vitamin (MULTIVITAMIN) tablet Take 1 tablet by mouth daily.   rosuvastatin (CRESTOR) 5 MG tablet Take 1 tablet (5 mg total) by mouth daily.   saccharomyces boulardii (FLORASTOR) 250 MG capsule Take 2 capsules (500 mg total) by mouth 2 (two) times daily.   Vitamin D, Ergocalciferol, (DRISDOL) 1.25 MG (50000 UNIT) CAPS capsule TAKE 1 CAPSULE BY MOUTH ONCE EVERY MONTH   No facility-administered encounter medications on file as of 09/20/2021.    Allergies (verified) Lexapro [escitalopram], Ace inhibitors, and Codeine   History: Past Medical History:  Diagnosis Date   Allergic rhinitis due to pollen    Cataract    NS OU   Complication of anesthesia    Cough    Gastroesophageal reflux disease without esophagitis    Irritable bowel syndrome    Lichen simplex chronicus 10/05   PONV (postoperative nausea and vomiting)    Past Surgical History:  Procedure Laterality Date   ABDOMINAL HYSTERECTOMY  1988   secondary to prolapse   BLADDER SUSPENSION     A-P with Hyst   INSERTION OF MESH  10/16/2018   Procedure:  INSERTION OF MESH;  Surgeon: Ardis Hughs, MD;  Location: [facility];  Service: Urology;;   NASAL SINUS SURGERY  1970   ROBOTIC ASSISTED LAPAROSCOPIC SACROCOLPOPEXY N/A 10/16/2018   Procedure: XI ROBOTIC ASSISTED LAPAROSCOPIC SACROCOLPOPEXY;  Surgeon: Ardis Hughs, MD;  Location: [facility];  Service: Urology;  Laterality: N/A;   TONSILLECTOMY     Family History  Problem Relation Age of Onset   Emphysema Mother    Lymphoma Mother    Asthma Mother    Cancer Father 20       lung  cancer   Coronary artery disease Brother    Cancer Brother        breast cancer   Breast cancer Brother    Cancer Brother        lung cancer   Diabetes Neg Hx    Hypertension Neg Hx    Colon cancer Neg Hx    Esophageal cancer Neg Hx    Rectal cancer Neg Hx    Stomach cancer Neg Hx    Social History   Socioeconomic History   Marital status: Married    Spouse name: Not on file   Number of children: 2   Years of education: 12   Highest education level: Not on file  Occupational History   Occupation: Environmental health practitioner  Tobacco Use   Smoking status: Never   Smokeless tobacco: Never  Vaping Use   Vaping Use: Never used  Substance and Sexual Activity   Alcohol use: No   Drug use: No   Sexual activity: Not Currently    Partners: Male    Birth control/protection: Surgical    Comment: hysterectomy  Other Topics Concern   Not on file  Social History Narrative   HSG. Married '63. 1 son- 29'; 1-daughter-'68; grandchildren 3. 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 Marriage in good health. 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 Provided lead to [email or web address].   Social Determinants of Health   Financial Resource Strain: Low Risk    Difficulty of Paying Living Expenses: Not hard at all  Food Insecurity: No Food Insecurity   Worried About Charity fundraiser in the Last Year: Never true   Arboriculturist in the Last Year: Never true  Transportation Needs: No Transportation Needs   Lack of Transportation (Medical): No   Lack of Transportation (Non-Medical): No  Physical Activity: Insufficiently Active   Days of Exercise per Week: 4 days   Minutes of Exercise per Session: 30 min  Stress: No Stress Concern Present   Feeling of Stress : Not at all  Social Connections: Moderately Integrated   Frequency of Communication with Friends and Family: More than three times a week   Frequency of Social  Gatherings with Friends and Family: More than three times a week   Attends Religious Services: More than 4 times per year   Active Member of [employer] or Organizations: No   Attends Music therapist: Never   Marital Status: Married    Tobacco Counseling Counseling given: Not Answered   Clinical Intake:  Pre-visit preparation completed: Yes  Pain : 0-10 Pain Score: 4  Pain Type: Chronic pain Pain Location: Hip Pain Descriptors / Indicators: Constant Pain Onset: More than a month ago Pain Relieving Factors: mobic  Pain Relieving Factors: mobic  Nutritional Risks: None Diabetes: No  How often do you need to have someone help you when you  read instructions, pamphlets, or other written materials from your doctor or pharmacy?: 1 - Never  Diabetic?  no  Interpreter Needed?: No  Information entered by :: [name] LPN   Activities of Daily Living In your present state of health, do you have any difficulty performing the following activities: 09/20/2021  Hearing? N  Vision? N  Difficulty concentrating or making decisions? N  Walking or climbing stairs? N  Dressing or bathing? N  Doing errands, shopping? N  Preparing Food and eating ? N  Using the Toilet? N  In the past six months, have you accidently leaked urine? Y  Do you have problems with loss of bowel control? N  Managing your Medications? N  Managing your Finances? N  Housekeeping or managing your Housekeeping? N  Some recent data might be hidden    Patient Care Team: Ronnald Nian, DO as PCP - General (Family Medicine) Irene Shipper, MD (Gastroenterology) Thalia Bloodgood, [place] as Referring Physician (Optometry) Kem Boroughs, [place] as Nurse Practitioner (Nurse Practitioner) Germaine Pomfret, [facility] as Pharmacist (Pharmacist)  Indicate any recent Medical Services you may have received from other than [facility] providers in the past year (date may be approximate).     Assessment:   This is a  routine wellness examination for [place].  Hearing/Vision screen Hearing Screening - Comments:: No issues hearin Vision Screening - Comments:: Dr. Katy Fitch Up to date  Dietary issues and exercise activities discussed: Current Exercise Habits: Home exercise routine, Type of exercise: walking, Time (Minutes): 30, Frequency (Times/Week): 4, Weekly Exercise (Minutes/Week): 120, Intensity: Mild   Goals Addressed             This Visit's Progress    Patient Stated       Continue current lifestyle       Depression Screen PHQ 2/9 Scores 09/20/2021 09/14/2020 08/03/2020 09/29/2019 09/10/2019 08/22/2018 08/14/2017  PHQ - 2 Score 0 0 0 - 0 0 0  PHQ- 9 Score - - - - - - 0  Exception Documentation - - - Other- indicate reason in comment box - - -  Not completed - - - Just completed - - -    Fall Risk Fall Risk  09/20/2021 09/14/2020 08/03/2020 09/10/2019 08/22/2018  Falls in the past year? 0 1 1 0 No  Number falls in past yr: 0 0 0 - -  Injury with Fall? 0 0 0 0 -  Comment - - tripped over cord at home. - -  Follow up Falls evaluation completed;Falls prevention discussed Falls prevention discussed - - -    FALL RISK PREVENTION PERTAINING TO THE HOME:  Any stairs in or around the home? No  If so, are there any without handrails? No  Home free of loose throw rugs in walkways, pet beds, electrical cords, etc? Yes  Adequate lighting in your home to reduce risk of falls? Yes   ASSISTIVE DEVICES UTILIZED TO PREVENT FALLS:  Life alert? No  Use of a cane, walker or w/c? No  Grab bars in the bathroom? Yes  Shower chair or bench in shower? No  Elevated toilet seat or a handicapped toilet? No   TIMED UP AND GO:  Was the test performed? No .   Cognitive Function:  Normal cognitive status assessed by direct observation by this Nurse Health Advisor. No abnormalities found.          Immunizations Immunization History  Administered Date(s) Administered   Fluad Quad(high Dose 65+) 09/16/2019    H1N1  01/07/2009   Influenza Whole 09/23/2012   Influenza, High Dose Seasonal PF 10/11/2017   Influenza,inj,Quad PF,6+ Mos 09/28/2015, 08/02/2016, 09/04/2018   Influenza-Unspecified 08/25/2014, 09/06/2020   PFIZER(Purple Top)SARS-COV-2 Vaccination 01/09/2020, 02/11/2020   Pneumococcal Conjugate-13 02/18/2014   Pneumococcal Polysaccharide-23 10/22/2012   Td 10/22/2012   Zoster Recombinat (Shingrix) 04/01/2019   Zoster, Live 11/28/2012    TDAP status: Up to date  Flu Vaccine status: Due, Education has been provided regarding the importance of this vaccine. Advised may receive this vaccine at local pharmacy or Health Dept. Aware to provide a copy of the vaccination record if obtained from local pharmacy or Health Dept. Verbalized acceptance and understanding.  Pneumococcal vaccine status: Up to date  Covid-19 vaccine status: Information provided on how to obtain vaccines.   Qualifies for Shingles Vaccine? No   Zostavax completed Yes   Shingrix Completed?: Yes  Screening Tests Health Maintenance  Topic Date Due   Zoster Vaccines- Shingrix (2 of 2) 05/27/2019   COVID-19 Vaccine (3 - Booster for Pfizer series) 07/10/2020   INFLUENZA VACCINE  07/25/2021   MAMMOGRAM  01/26/2022   TETANUS/TDAP  10/22/2022   DEXA SCAN  Completed   Hepatitis C Screening  Completed   HPV VACCINES  Aged Out    Health Maintenance  Health Maintenance Due  Topic Date Due   Zoster Vaccines- Shingrix (2 of 2) 05/27/2019   COVID-19 Vaccine (3 - Booster for Pfizer series) 07/10/2020   INFLUENZA VACCINE  07/25/2021    Colorectal cancer screening: No longer required.   Mammogram status: Completed  . Repeat every year  Bone Density status: Completed 2021. Results reflect: Bone density results: OSTEOPOROSIS. Repeat every 2 years.  Lung Cancer Screening: (Low Dose CT Chest recommended if Age 5-80 years, 30 pack-year currently smoking OR have quit w/in 15years.) does not qualify.   Lung Cancer  Screening Referral:   Additional Screening:  Hepatitis C Screening: does not qualify; Completed   Vision Screening: Recommended annual ophthalmology exams for early detection of glaucoma and other disorders of the eye. Is the patient up to date with their annual eye exam?  Yes  Who is the provider or what is the name of the office in which the patient attends annual eye exams? Dr Katy Fitch If pt is not established with a provider, would they like to be referred to a provider to establish care? No .   Dental Screening: Recommended annual dental exams for proper oral hygiene  Community Resource Referral / Chronic Care Management: CRR required this visit?  No   CCM required this visit?  No      Plan:     I have personally reviewed and noted the following in the patient's chart:   Medical and social history Use of alcohol, tobacco or illicit drugs  Current medications and supplements including opioid prescriptions.  Functional ability and status Nutritional status Physical activity Advanced directives List of other physicians Hospitalizations, surgeries, and ER visits in previous 12 months Vitals Screenings to include cognitive, depression, and falls Referrals and appointments  In addition, I have reviewed and discussed with patient certain preventive protocols, quality metrics, and best practice recommendations. A written personalized care plan for preventive services as well as general preventive health recommendations were provided to patient.     [name], LPN   6/81/2751   Nurse Notes:

## 2021-09-20 NOTE — Patient Instructions (Signed)
Gloria Werner , Thank you for taking time to come for your Medicare Wellness Visit. I appreciate your ongoing commitment to your health goals. Please review the following plan we discussed and let me know if I can assist you in the future.   Screening recommendations/referrals: Colonoscopy: no longer required Mammogram: up to date Bone Density: up to date Recommended yearly ophthalmology/optometry visit for glaucoma screening and checkup Recommended yearly dental visit for hygiene and checkup  Vaccinations: Influenza vaccine: Education provided Pneumococcal vaccine: up to date Tdap vaccine: up to date Shingles vaccine: up to date    Advanced directives: yes  not on file  Conditions/risks identified:   Next appointment: 11-16-2021 @ 10:00 Dr. Gena Fray   Preventive Care 78 Years and Older, Female Preventive care refers to lifestyle choices and visits with your health care provider that can promote health and wellness. What does preventive care include? A yearly physical exam. This is also called an annual well check. Dental exams once or twice a year. Routine eye exams. Ask your health care provider how often you should have your eyes checked. Personal lifestyle choices, including: Daily care of your teeth and gums. Regular physical activity. Eating a healthy diet. Avoiding tobacco and drug use. Limiting alcohol use. Practicing safe sex. Taking low-dose aspirin every day. Taking vitamin and mineral supplements as recommended by your health care provider. What happens during an annual well check? The services and screenings done by your health care provider during your annual well check will depend on your age, overall health, lifestyle risk factors, and family history of disease. Counseling  Your health care provider may ask you questions about your: Alcohol use. Tobacco use. Drug use. Emotional well-being. Home and relationship well-being. Sexual activity. Eating  habits. History of falls. Memory and ability to understand (cognition). Work and work Statistician. Reproductive health. Screening  You may have the following tests or measurements: Height, weight, and BMI. Blood pressure. Lipid and cholesterol levels. These may be checked every 5 years, or more frequently if you are over 79 years old. Skin check. Lung cancer screening. You may have this screening every year starting at age 23 if you have a 30-pack-year history of smoking and currently smoke or have quit within the past 15 years. Fecal occult blood test (FOBT) of the stool. You may have this test every year starting at age 107. Flexible sigmoidoscopy or colonoscopy. You may have a sigmoidoscopy every 5 years or a colonoscopy every 10 years starting at age 24. Hepatitis C blood test. Hepatitis B blood test. Sexually transmitted disease (STD) testing. Diabetes screening. This is done by checking your blood sugar (glucose) after you have not eaten for a while (fasting). You may have this done every 1-3 years. Bone density scan. This is done to screen for osteoporosis. You may have this done starting at age 15. Mammogram. This may be done every 1-2 years. Talk to your health care provider about how often you should have regular mammograms. Talk with your health care provider about your test results, treatment options, and if necessary, the need for more tests. Vaccines  Your health care provider may recommend certain vaccines, such as: Influenza vaccine. This is recommended every year. Tetanus, diphtheria, and acellular pertussis (Tdap, Td) vaccine. You may need a Td booster every 10 years. Zoster vaccine. You may need this after age 48. Pneumococcal 13-valent conjugate (PCV13) vaccine. One dose is recommended after age 11. Pneumococcal polysaccharide (PPSV23) vaccine. One dose is recommended after age 25. Talk to  your health care provider about which screenings and vaccines you need and how  often you need them. This information is not intended to replace advice given to you by your health care provider. Make sure you discuss any questions you have with your health care provider. Document Released: 01/07/2016 Document Revised: 08/30/2016 Document Reviewed: 10/12/2015 Elsevier Interactive Patient Education  2017 [employer] Prevention in the Home Falls can cause injuries. They can happen to people of all ages. There are many things you can do to make your home safe and to help prevent falls. What can I do on the outside of my home? Regularly fix the edges of walkways and driveways and fix any cracks. Remove anything that might make you trip as you walk through a door, such as a raised step or threshold. Trim any bushes or trees on the path to your home. Use bright outdoor lighting. Clear any walking paths of anything that might make someone trip, such as rocks or tools. Regularly check to see if handrails are loose or broken. Make sure that both sides of any steps have handrails. Any raised decks and porches should have guardrails on the edges. Have any leaves, snow, or ice cleared regularly. Use sand or salt on walking paths during winter. Clean up any spills in your garage right away. This includes oil or grease spills. What can I do in the bathroom? Use night lights. Install grab bars by the toilet and in the tub and shower. Do not use towel bars as grab bars. Use non-skid mats or decals in the tub or shower. If you need to sit down in the shower, use a plastic, non-slip stool. Keep the floor dry. Clean up any water that spills on the floor as soon as it happens. Remove soap buildup in the tub or shower regularly. Attach bath mats securely with double-sided non-slip rug tape. Do not have throw rugs and other things on the floor that can make you trip. What can I do in the bedroom? Use night lights. Make sure that you have a light by your bed that is easy to  reach. Do not use any sheets or blankets that are too big for your bed. They should not hang down onto the floor. Have a firm chair that has side arms. You can use this for support while you get dressed. Do not have throw rugs and other things on the floor that can make you trip. What can I do in the kitchen? Clean up any spills right away. Avoid walking on wet floors. Keep items that you use a lot in easy-to-reach places. If you need to reach something above you, use a strong step stool that has a grab bar. Keep electrical cords out of the way. Do not use floor polish or wax that makes floors slippery. If you must use wax, use non-skid floor wax. Do not have throw rugs and other things on the floor that can make you trip. What can I do with my stairs? Do not leave any items on the stairs. Make sure that there are handrails on both sides of the stairs and use them. Fix handrails that are broken or loose. Make sure that handrails are as long as the stairways. Check any carpeting to make sure that it is firmly attached to the stairs. Fix any carpet that is loose or worn. Avoid having throw rugs at the top or bottom of the stairs. If you do have throw rugs, attach  them to the floor with carpet tape. Make sure that you have a light switch at the top of the stairs and the bottom of the stairs. If you do not have them, ask someone to add them for you. What else can I do to help prevent falls? Wear shoes that: Do not have high heels. Have rubber bottoms. Are comfortable and fit you well. Are closed at the toe. Do not wear sandals. If you use a stepladder: Make sure that it is fully opened. Do not climb a closed stepladder. Make sure that both sides of the stepladder are locked into place. Ask someone to hold it for you, if possible. Clearly mark and make sure that you can see: Any grab bars or handrails. First and last steps. Where the edge of each step is. Use tools that help you move  around (mobility aids) if they are needed. These include: Canes. Walkers. Scooters. Crutches. Turn on the lights when you go into a dark area. Replace any light bulbs as soon as they burn out. Set up your furniture so you have a clear path. Avoid moving your furniture around. If any of your floors are uneven, fix them. If there are any pets around you, be aware of where they are. Review your medicines with your doctor. Some medicines can make you feel dizzy. This can increase your chance of falling. Ask your doctor what other things that you can do to help prevent falls. This information is not intended to replace advice given to you by your health care provider. Make sure you discuss any questions you have with your health care provider. Document Released: 10/07/2009 Document Revised: 05/18/2016 Document Reviewed: 01/15/2015 Elsevier Interactive Patient Education  2017 [employer].

## 2021-09-22 ENCOUNTER — Ambulatory Visit: Payer: Medicare Other

## 2021-10-06 ENCOUNTER — Telehealth: Payer: Self-pay

## 2021-10-06 NOTE — Progress Notes (Signed)
Chronic Care Management Pharmacy Assistant   Name: Gloria Werner  MRN: [id number] DOB: 1941/12/23  Chart Review for clinical pharmacist on 10/13/2021.   Conditions to be addressed/monitored: HTN, Depression, and Aortic atherosclerosis, IBS, GERD,Osteopenia, DDD, Unilateral primary osteoarthritis,Seasonal affective disorder,Vitamin D deficiency  Primary concerns for visit include: Overall Health   Recent office visits:  09/20/2021 Gloria Kennedy LPN (PCP Office) Medicare Wellness completed, No medication changes noted 06/02/2021  Gloria Median DO (PCP) referral to Gloria Werner place 05/26/2021 Gloria Median DO (PCP) start Dicyclomin HCI 10 mg 3 times daily,Take meloxicam 7.5mg  1 tab daily. If no/minimal relief after 1-2 wks, increase to 1 tab 2x/day with food.Ice pack 2x/day - 10-37min  Recent consult visits:  08/16/2021 Gloria. Melvyn Novas MD (Pulmonology) No medication Changes noted 08/16/2021 Gloria Best NP (Gastroenterology) start Linzess 72 mcg 1 tab to be taken 30 minutes prior to breakfast,Miralax if needed at bedtime, IBgard 1 capsule twice a day as needed for abdominal pain.gas/bloating,Follow-up in the office in 2 to 3 months 08/04/2021 Gloria. Ninfa Linden MD (Orthopedic) No medication changes noted, Return for 2 weeks post-op. 06/14/2021 Gloria. Ernestina Patches MD (Ortho Care Physiatry) Large Joint Inj: R hip joint ,4 mL bupivacaine 0.25 %; 60 mg triamcinolone acetonide 40 MG/ML 06/07/2021 Gloria. Sharol Given MD (Orthopedics) No Medication Changes noted  Hospital visits:  None in previous 6 months  Have you seen any other providers since your last visit?   Patient denies seeing any other providers.  Any changes in your medications or health?   Patient denies any changes in her medications or health.  Any side effects from any medications?   Patient denies any side effects from her current medications at this time.  Do you have an symptoms or problems not managed by your medications?    Patient denies any problems at this time.  Any concerns about your health right now?   Patient states she does not have any concerns at this time beside her insurance.Patient reports her insurance sent her a new card in the mail with a Provider name Gloria Husky MD,but she is no longer at her PCP Office.Patient states she is unsure why they sent her that,but she has a appointment to establish care with Gloria Werner in November. Patient states she may call her insurance company to get this fix.  Has your provider asked that you check blood pressure, blood sugar, or follow special diet at home?   Patient reports checking her blood pressure rarely at home.  Do you get any type of exercise on a regular basis?   Patient states she is not exercising at the moment due to hip pain, but she will be having hip replacement in November.   Can you think of a goal you would like to reach for your health?   Will inform clinical pharmacist.  Do you have any problems getting your medications?   Patient denies any problems getting her medications.  Is there anything that you would like to discuss during the appointment?   Overall Health  Please bring medications and supplements to appointment  Patient is aware to have all medications and supplement at her appointment.  Medications: Outpatient Encounter Medications as of 10/06/2021  Medication Sig   amLODipine (NORVASC) 5 MG tablet Take 1 tablet (5 mg total) by mouth daily.   azelastine (ASTELIN) 0.1 % nasal spray Place 1 spray into both nostrils 2 (two) times daily as needed for rhinitis or allergies. Use in each nostril as directed   buPROPion [facility])  75 MG tablet Take 1 tablet (75 mg total) by mouth 2 (two) times daily.   calcium carbonate (OSCAL) 1500 (600 Ca) MG TABS tablet Take by mouth daily with breakfast.   Carboxymethylcellul-Glycerin (LUBRICATING EYE DROPS OP) Place 1 drop into both eyes daily as needed (dry eyes).   dicyclomine (BENTYL) 10  MG capsule Take 1 capsule (10 mg total) by mouth 4 (four) times daily -  before meals and at bedtime.   linaclotide (LINZESS) 72 MCG capsule Take 1 capsule (72 mcg total) by mouth daily before breakfast.   meloxicam (MOBIC) 7.5 MG tablet Take 1 tablet (7.5 mg total) by mouth daily.   Multiple Vitamin (MULTIVITAMIN) tablet Take 1 tablet by mouth daily.   rosuvastatin (CRESTOR) 5 MG tablet Take 1 tablet (5 mg total) by mouth daily.   saccharomyces boulardii (FLORASTOR) 250 MG capsule Take 2 capsules (500 mg total) by mouth 2 (two) times daily.   Vitamin D, Ergocalciferol, (DRISDOL) 1.25 MG (50000 UNIT) CAPS capsule TAKE 1 CAPSULE BY MOUTH ONCE EVERY MONTH   No facility-administered encounter medications on file as of 10/06/2021.    Care Gaps: Covid-19 Vaccine (Dose 3- Booster for [employer] Series) Influenza Vaccine  Star Rating Drugs: Rosuvastatin 5 mg last filled 01/13/2021 90 day supply at [facility].  Medication Fill Gaps: Bupropion 75 MG last filled 01/14/2021 90 day supply. Dicyclomine 10 MG last filled 05/26/2021 30 day supply. Linaclotide 72 MCG  last filled 08/16/2021 30 day supply.   [place] Pharmacist Assistant [phone]

## 2021-10-13 ENCOUNTER — Ambulatory Visit (INDEPENDENT_AMBULATORY_CARE_PROVIDER_SITE_OTHER): Payer: Medicare Other

## 2021-10-13 DIAGNOSIS — M8588 Other specified disorders of bone density and structure, other site: Secondary | ICD-10-CM

## 2021-10-13 DIAGNOSIS — M1611 Unilateral primary osteoarthritis, right hip: Secondary | ICD-10-CM

## 2021-10-13 DIAGNOSIS — K582 Mixed irritable bowel syndrome: Secondary | ICD-10-CM

## 2021-10-13 DIAGNOSIS — I10 Essential (primary) hypertension: Secondary | ICD-10-CM

## 2021-10-13 DIAGNOSIS — I700 Atherosclerosis of aorta: Secondary | ICD-10-CM

## 2021-10-13 NOTE — Progress Notes (Signed)
Chronic Care Management Pharmacy Note  10/17/2021 Name:  Gloria Werner MRN:  [id number] DOB:  08-Jan-1941  Summary: Patient presents for initial CCM consult. She has a hip replacement scheduled in November. She is at a high risk of hip fracture and would be a candidate for osteoporosis therapy.   She has myalgias with rosuvastatin, so she only takes the medication 3 times weekly  Recommendations/Changes made from today's visit: -Recommend rechecking fasting lipid panel. Could consider trial of atorvastatin to see if able to tolerate better.   Plan: CPP follow-up 6 months   Recommended Problem List Changes:  Add: Hyperlipidemia Generalized Anxiety Disorder  Subjective: Gloria Werner is an 80 y.o. year old female who is a primary patient of Arlester Marker MD.  The CCM team was consulted for assistance with disease management and care coordination needs.    Engaged with patient by telephone for initial visit in response to provider referral for pharmacy case management and/or care coordination services.   Consent to Services:  The patient was given the following information about Chronic Care Management services today, agreed to services, and gave verbal consent: 1. CCM service includes personalized support from designated clinical staff supervised by the primary care provider, including individualized plan of care and coordination with other care providers 2. 24/7 contact phone numbers for assistance for urgent and routine care needs. 3. Service will only be billed when office clinical staff spend 20 minutes or more in a month to coordinate care. 4. Only one practitioner may furnish and bill the service in a calendar month. 5.The patient may stop CCM services at any time (effective at the end of the month) by phone call to the office staff. 6. The patient will be responsible for cost sharing (co-pay) of up to 20% of the service fee (after annual deductible is met). Patient  agreed to services and consent obtained.  Patient Care Team: Ronnald Nian, DO as PCP - General (Family Medicine) Irene Shipper, MD (Gastroenterology) Thalia Bloodgood, Duran as Referring Physician (Optometry) Kem Boroughs, Hughes as Nurse Practitioner (Nurse Practitioner) Germaine Pomfret, [facility] as Pharmacist (Pharmacist)  Recent office visits: 09/20/2021 [name] LPN (PCP Office) Medicare Wellness completed, No medication changes noted 06/02/2021  [name] DO (PCP) referral to [facility] place 05/26/2021 [name] DO (PCP) start Dicyclomin HCI 10 mg 3 times daily,Take meloxicam 7.44m 1 tab daily. If no/minimal relief after 1-2 wks, increase to 1 tab 2x/day with food.Ice pack 2x/day - 10-152m  Recent consult visits: 08/16/2021 Dr. WeMelvyn NovasD (Pulmonology) No medication Changes noted 08/16/2021 CoCarl BestP (Gastroenterology) start Linzess 72 mcg 1 tab to be taken 30 minutes prior to breakfast,Miralax if needed at bedtime, IBgard 1 capsule twice a day as needed for abdominal pain.gas/bloating,Follow-up in the office in 2 to 3 months 08/04/2021 Dr. BlNinfa LindenD (Orthopedic) No medication changes noted, Return for 2 weeks post-op. 06/14/2021 Dr. NeErnestina PatchesD ([place] Inj: R hip joint ,4 mL bupivacaine 0.25 %; 60 mg triamcinolone acetonide 40 MG/ML 06/07/2021 Dr. DuSharol GivenD (Orthopedics) No Medication Changes noted  Hospital visits: None in previous 6 months   Objective:  Lab Results  Component Value Date   CREATININE 0.86 08/03/2020   BUN 13 08/03/2020   GFR 64.04 08/03/2020   GFRNONAA >60 10/17/2018   GFRAA >60 10/17/2018   NA 140 08/03/2020   K 4.1 08/03/2020   CALCIUM 9.5 08/03/2020   CO2 26 08/03/2020   GLUCOSE 92 08/03/2020  Lab Results  Component Value Date/Time   GFR 64.04 08/03/2020 11:35 AM   GFR 71.79 12/29/2019 08:05 AM    Last diabetic Eye exam: No results found for: HMDIABEYEEXA  Last diabetic Foot exam: No  results found for: HMDIABFOOTEX   Lab Results  Component Value Date   CHOL 162 08/03/2020   HDL 58.00 08/03/2020   LDLCALC 84 08/03/2020   LDLDIRECT 84.0 08/03/2020   TRIG 101.0 08/03/2020   CHOLHDL 3 08/03/2020    Hepatic Function Latest Ref Rng & Units 08/03/2020 12/29/2019 09/29/2019  Total Protein 6.0 - 8.3 g/dL 7.0 7.3 6.8  Albumin 3.5 - 5.2 g/dL 4.4 4.5 4.4  AST 0 - 37 U/L _0 ALT 0 - 35 U/L _1 Alk Phosphatase 39 - 117 U/L 54 53 59  Total Bilirubin 0.2 - 1.2 mg/dL 0.5 0.4 0.4  Bilirubin, Direct 0.0 - 0.3 mg/dL - - -    Lab Results  Component Value Date/Time   TSH 1.64 09/29/2019 08:39 AM   TSH 2.02 09/04/2018 07:53 AM    CBC Latest Ref Rng & Units 08/03/2020 09/29/2019 10/17/2018  WBC 4.0 - 10.5 K/uL 5.5 4.2 -  Hemoglobin 12.0 - 15.0 g/dL 13.7 13.7 10.8(L)  Hematocrit 36.0 - 46.0 % 40.9 41.3 34.7(L)  Platelets 150.0 - 400.0 K/uL 182.0 197.0 -    Lab Results  Component Value Date/Time   VD25OH 33.36 08/03/2020 11:35 AM   VD25OH 35.31 09/29/2019 08:39 AM    Clinical ASCVD: No  The ASCVD Risk score (Arnett DK, et al., 2019) failed to calculate for the following reasons:   The systolic blood pressure is missing    Depression screen [facility] 2/9 09/20/2021 09/14/2020 08/03/2020  Decreased Interest 0 0 0  Down, Depressed, Hopeless 0 0 0  PHQ - 2 Score 0 0 0  Altered sleeping - - -  Tired, decreased energy - - -  Change in appetite - - -  Feeling bad or failure about yourself  - - -  Trouble concentrating - - -  Moving slowly or fidgety/restless - - -  Suicidal thoughts - - -  PHQ-9 Score - - -  Difficult doing work/chores - - -  Some recent data might be hidden    Social History   Tobacco Use  Smoking Status Never  Smokeless Tobacco Never   BP Readings from Last 3 Encounters:  08/16/21 118/78  08/16/21 130/70  05/26/21 (!) 146/96   Pulse Readings from Last 3 Encounters:  08/16/21 74  08/16/21 81  05/26/21 88   Wt Readings from Last 3  Encounters:  08/16/21 153 lb (69.4 kg)  08/16/21 153 lb (69.4 kg)  05/26/21 153 lb (69.4 kg)   BMI Readings from Last 3 Encounters:  08/16/21 29.88 kg/m  08/16/21 29.88 kg/m  05/26/21 29.88 kg/m    Assessment/Interventions: Review of patient past medical history, allergies, medications, health status, including review of consultants reports, laboratory and other test data, was performed as part of comprehensive evaluation and provision of chronic care management services.   SDOH:  (Social Determinants of Health) assessments and interventions performed: Yes SDOH Interventions    Flowsheet Row Most Recent Value  SDOH Interventions   Financial Strain Interventions Intervention Not Indicated      SDOH Screenings   Alcohol Screen: Low Risk    Last Alcohol Screening Score (AUDIT): 0  Depression (PHQ2-9): Low Risk    PHQ-2 Score: 0  Financial Resource Strain: Low Risk  Difficulty of Paying Living Expenses: Not hard at all  Food Insecurity: No Food Insecurity   Worried About Charity fundraiser in the Last Year: Never true   Ran Out of Food in the Last Year: Never true  Housing: Low Risk    Last Housing Risk Score: 0  Physical Activity: Insufficiently Active   Days of Exercise per Week: 4 days   Minutes of Exercise per Session: 30 min  Social Connections: Moderately Integrated   Frequency of Communication with Friends and Family: More than three times a week   Frequency of Social Gatherings with Friends and Family: More than three times a week   Attends Religious Services: More than 4 times per year   Active Member of [employer] or Organizations: No   Attends Music therapist: Never   Marital Status: Married  Stress: No Stress Concern Present   Feeling of Stress : Not at all  Tobacco Use: Low Risk    Smoking Tobacco Use: Never   Smokeless Tobacco Use: Never   Passive Exposure: Not on file  Transportation Needs: No Transportation Needs   Lack of Transportation  (Medical): No   Lack of Transportation (Non-Medical): No    CCM Care Plan  Allergies  Allergen Reactions   Lexapro [Escitalopram] Diarrhea and Other (See Comments)    headache   Ace Inhibitors Cough   Codeine Nausea And Vomiting    Medications Reviewed Today     Reviewed by Germaine Pomfret, [facility] (Pharmacist) on 10/17/21 at 1251  Med List Status: <None>   Medication Order Taking? Sig Documenting Provider Last Dose Status Informant  acetaminophen (TYLENOL) 500 MG tablet [id number] Yes Take 500-1,000 mg by mouth at bedtime as needed for moderate pain. [provider] Taking Active   amLODipine (NORVASC) 5 MG tablet [id number] Yes Take 1 tablet (5 mg total) by mouth daily. Ronnald Nian, DO Taking Active   Biotin 10000 MCG TABS [id number] Yes Take 10,000 mcg by mouth daily. [provider] Taking Active   buPROPion (WELLBUTRIN) 75 MG tablet [id number] Yes Take 1 tablet (75 mg total) by mouth 2 (two) times daily.  Patient taking differently: Take 75 mg by mouth daily.   Ronnald Nian, DO Taking Active   calcium carbonate (OSCAL) 1500 (600 Ca) MG TABS tablet [id number] Yes Take by mouth daily with breakfast. [provider] Taking Active   Carboxymethylcellul-Glycerin (LUBRICATING EYE DROPS OP) [id number] Yes Place 1 drop into both eyes daily as needed (dry eyes). [provider] Taking Active Self  Cyanocobalamin (VITAMIN B-12) 500 MCG LOZG [id number] Yes Take 1,000 mcg by mouth daily. [provider] Taking Active   dicyclomine (BENTYL) 10 MG capsule [id number] Yes Take 1 capsule (10 mg total) by mouth 4 (four) times daily -  before meals and at bedtime. Ronnald Nian, DO Taking Active   meloxicam (MOBIC) 7.5 MG tablet [id number] Yes Take 1 tablet (7.5 mg total) by mouth daily. Ronnald Nian, DO Taking Active   polyethylene glycol (MIRALAX / GLYCOLAX) 17 g packet [id number] Yes Take 17 g by mouth daily. Takes an extra scoop in the  evening as needed for moderate-severe constipation. [provider] Taking Active   pyridOXINE (VITAMIN B-6) 100 MG tablet [id number] Yes Take 100 mg by mouth daily. [provider] Taking Active   rosuvastatin (CRESTOR) 5 MG tablet [id number] Yes Take 1 tablet (5 mg total) by mouth daily. Ronnald Nian, DO Taking Active   saccharomyces boulardii [facility])  250 MG capsule [id number] Yes Take 2 capsules (500 mg total) by mouth 2 (two) times daily.  Patient taking differently: Take 250 mg by mouth daily.   Esterwood, Amy S, PA-C Taking Active   Saline 0.9 % SOLN [id number] Yes Place 1 spray into the nose as needed. [provider] Taking Active   vitamin C (ASCORBIC ACID) 500 MG tablet [id number] Yes Take 500 mg by mouth daily. [provider] Taking Active   Vitamin D, Ergocalciferol, (DRISDOL) 1.25 MG (50000 UNIT) CAPS capsule [id number] Yes TAKE 1 CAPSULE BY MOUTH ONCE EVERY MONTH Kennyth Arnold, FNP Taking Active             Patient Active Problem List   Diagnosis Date Noted   Unilateral primary osteoarthritis, 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   Medication side effect 08/03/2020   DDD (degenerative disc disease), lumbar 09/28/2019   Aortic atherosclerosis ([place]) 09/28/2019   Rib pain 09/16/2019   Right lumbar pain 09/16/2019   OSA (obstructive sleep apnea) 01/27/2019   Pulmonary nodule 01/27/2019   Allergic rhinitis 11/06/2018   Upper airway cough syndrome 09/24/2018   GERD (gastroesophageal reflux disease) 09/04/2018   De Quervain's syndrome (tenosynovitis) 07/03/2018   Essential hypertension 06/03/2018   Seasonal affective disorder ([place]) 01/17/2017   Vitamin D deficiency 81/85/6314   Lichen simplex chronicus 04/08/2015   Osteopenia 04/08/2015   Depression 03/05/2015   ALLERGIC RHINITIS, SEASONAL 02/03/2008   IRRITABLE BOWEL SYNDROME 02/03/2008    Immunization History  Administered Date(s) Administered   Fluad Quad(high Dose 65+) 09/16/2019   H1N1  01/07/2009   Influenza Whole 09/23/2012   Influenza, High Dose Seasonal PF 10/11/2017   Influenza,inj,Quad PF,6+ Mos 09/28/2015, 08/02/2016, 09/04/2018   Influenza-Unspecified 08/25/2014, 09/06/2020, 10/14/2021   PFIZER(Purple Top)SARS-COV-2 Vaccination 01/09/2020, 02/11/2020   Pneumococcal Conjugate-13 02/18/2014   Pneumococcal Polysaccharide-23 10/22/2012   Td 10/22/2012   Zoster Recombinat (Shingrix) 04/01/2019   Zoster, Live 11/28/2012    Conditions to be addressed/monitored:  Hypertension, Hyperlipidemia, Coronary Artery Disease, Anxiety, Osteopenia, Osteoarthritis, and Irritable Bowel Syndrome  Care Plan : General Pharmacy (Adult)  Updates made by Germaine Pomfret, RPH since 10/17/2021 12:00 AM     Problem: Hypertension, Hyperlipidemia, Coronary Artery Disease, Anxiety, Osteopenia, Osteoarthritis, and Irritable Bowel Syndrome   Priority: High     Long-Range Goal: Patient-Specific Goal   Start Date: 10/17/2021  Expected End Date: 10/17/2022  This Visit's Progress: On track  Priority: High  Note:   Current Barriers:  Unable to achieve control of cholesterol   Pharmacist Clinical Goal(s):  Patient will achieve control of cholesterol as evidenced by LDL less than 70 through collaboration with PharmD and provider.   Interventions: 1:1 collaboration with Arlester Marker MD regarding development and update of comprehensive plan of care as evidenced by provider attestation and co-signature Inter-disciplinary care team collaboration (see longitudinal plan of care) Comprehensive medication review performed; medication list updated in electronic medical record  Hypertension (BP goal <140/90) -Controlled -Current treatment: Amlodipine 5 mg daily  -Medications previously tried: Lisinopril (Cough)   -Current home readings: Does not monitor regularly  -Current dietary habits: 3-4 cups of water daily, 1 diet coke daily. 2 cups of coffee in the mornings. Trying to eat less.   -Current exercise habits: Unable to exercise due to hip  -Continues to experience intermittent cough despite stopping lisinopril. Recent visit with pulmonology recommended gabapentin or PPI to see if cough improves, patient did not want any changes at that time.  -Denies hypotensive/hypertensive symptoms -Recommended to continue current medication  Hyperlipidemia: (LDL goal < 70) -Uncontrolled -History of aortic atherosclerosis  -Current treatment: Rosuvastatin 5 mg daily (only taking 2-3 times weekly)  -Medications previously tried: NA  -Does report muscle aches. Overall bearable, but unable to tolerate more than 3x weekly dosing of rosuvastatin -Recommend rechecking fasting lipid panel. Could consider trial of atorvastatin to see if able to tolerate better.  -Recommended to continue current medication  Anxiety (Goal: Achieve symptom remission) -Not ideally controlled -Current treatment: Bupropion 75 mg twice daily  -Medications previously tried/failed: NA -Feels more stress, irritable recently.  -PHQ9: 0 -GAD7: 5 -Bupropion is questionably appropriate for use with anxiety, patient reports that it helps "take the edge off" so she feels less anxious and sleep better.  -Recommended to continue current medication  Osteopenia (Goal Prevent fractures) -Uncontrolled -Last DEXA Scan: 12/21/20   T-Score femoral neck: -2.3  T-Score total hip: -1.5  T-Score lumbar spine: NA  T-Score forearm radius: -2.0  10-year probability of major osteoporotic fracture: 16.2%  10-year probability of hip fracture: 5.0% -Patient is a candidate for pharmacologic treatment due to T-Score -1.0 to -2.5 and 10-year risk of hip fracture > 3% -Current treatment  Calcium Carbonate 600 mg daily  Ergocalciferol 50,000 units monthly -Medications previously tried: NA  -Recommend 1200 mg of calcium daily from dietary and supplemental sources. -Patient is a candidate for pharmacological treatment given hip  fracture risk. Discussed possibility of starting a bisphosphonate but will defer until after upcoming surgery.  -Recommended to continue current medication  IBS (Goal: Improve symptoms) -Controlled -Current treatment  Dicylcomine 10 mg four times daily  Florastor 250 mg 2 caps twice daily  -Medications previously tried: Linzess (cost)  -Recommended to continue current medication  Osteoarthritis (Goal: Improve symptoms ) -Controlled -Current treatment  Acetaminophen 500 mg 1-2 tablets nightly.  Meloxicam 7.5 mg daily  -Medications previously tried: NA  -Hip replacement scheduled for November  -Counseled on risks of long-term NSAID use -Recommended to continue current medication  Patient Goals/Self-Care Activities Patient will:  - check blood pressure weekly, document, and provide at future appointments  Follow Up Plan: Telephone follow up appointment with care management team member scheduled for:  04/13/2022 at 8:30 AM      Medication Assistance: None required.  Patient affirms current coverage meets needs.  Compliance/Adherence/Medication fill history: Care Gaps: Covid-19 Vaccine (Dose 3- Booster for [employer] Series) Influenza Vaccine  Star-Rating Drugs: Rosuvastatin 5 mg last filled 01/13/2021 90 day supply at [facility].  Patient's preferred pharmacy is:  [place], [place] - [address] [address] Phone: [phone] Fax: [phone]  Uses pill box? Yes Pt endorses 100% compliance  We discussed: Current pharmacy is preferred with insurance plan and patient is satisfied with pharmacy services Patient decided to: Continue current medication management strategy  Care Plan and Follow Up Patient Decision:  Patient agrees to Care Plan and Follow-up.  Plan: Telephone follow up appointment with care management team member scheduled for:  04/13/2022 at 8:30 AM  Junius Argyle, PharmD, [place],  CPP Clinical Pharmacist [facility]  [phone]

## 2021-10-17 MED ORDER — ROSUVASTATIN CALCIUM 5 MG PO TABS: 5.0000 mg | ORAL_TABLET | ORAL | 1 refills | Status: DC

## 2021-10-17 NOTE — Patient Instructions (Signed)
Visit Information It was great speaking with you today!  Please let me know if you have any questions about our visit.   Goals Addressed             This Visit's Progress    Track and Manage My Blood Pressure-Hypertension       Timeframe:  Long-Range Goal Priority:  High Start Date: 10/17/2021                            Expected End Date: 10/17/2022                    Follow Up within 90 days   - check blood pressure weekly    Why is this important?   You won't feel high blood pressure, but it can still hurt your blood vessels.  High blood pressure can cause heart or kidney problems. It can also cause a stroke.  Making lifestyle changes like losing a little weight or eating less salt will help.  Checking your blood pressure at home and at different times of the day can help to control blood pressure.  If the doctor prescribes medicine remember to take it the way the doctor ordered.  Call the office if you cannot afford the medicine or if there are questions about it.     Notes:         Patient Care Plan: General Pharmacy (Adult)     Problem Identified: Hypertension, Hyperlipidemia, Coronary Artery Disease, Anxiety, Osteopenia, Osteoarthritis, and Irritable Bowel Syndrome   Priority: High     Long-Range Goal: Patient-Specific Goal   Start Date: 10/17/2021  Expected End Date: 10/17/2022  This Visit's Progress: On track  Priority: High  Note:   Current Barriers:  Unable to achieve control of cholesterol   Pharmacist Clinical Goal(s):  Patient will achieve control of cholesterol as evidenced by LDL less than 70 through collaboration with PharmD and provider.   Interventions: 1:1 collaboration with Arlester Marker MD regarding development and update of comprehensive plan of care as evidenced by provider attestation and co-signature Inter-disciplinary care team collaboration (see longitudinal plan of care) Comprehensive medication review performed; medication list  updated in electronic medical record  Hypertension (BP goal <140/90) -Controlled -Current treatment: Amlodipine 5 mg daily  -Medications previously tried: Lisinopril (Cough)   -Current home readings: Does not monitor regularly  -Current dietary habits: 3-4 cups of water daily, 1 diet coke daily. 2 cups of coffee in the mornings. Trying to eat less.  -Current exercise habits: Unable to exercise due to hip  -Continues to experience intermittent cough despite stopping lisinopril. Recent visit with pulmonology recommended gabapentin or PPI to see if cough improves, patient did not want any changes at that time.  -Denies hypotensive/hypertensive symptoms -Recommended to continue current medication  Hyperlipidemia: (LDL goal < 70) -Uncontrolled -History of aortic atherosclerosis  -Current treatment: Rosuvastatin 5 mg daily (only taking 2-3 times weekly)  -Medications previously tried: NA  -Does report muscle aches. Overall bearable, but unable to tolerate more than 3x weekly dosing of rosuvastatin -Recommend rechecking fasting lipid panel. Could consider trial of atorvastatin to see if able to tolerate better.  -Recommended to continue current medication  Anxiety (Goal: Achieve symptom remission) -Not ideally controlled -Current treatment: Bupropion 75 mg twice daily  -Medications previously tried/failed: NA -Feels more stress, irritable recently.  -PHQ9: 0 -GAD7: 5 -Bupropion is questionably appropriate for use with anxiety, patient reports that it helps "  take the edge off" so she feels less anxious and sleep better.  -Recommended to continue current medication  Osteopenia (Goal Prevent fractures) -Uncontrolled -Last DEXA Scan: 12/21/20   T-Score femoral neck: -2.3  T-Score total hip: -1.5  T-Score lumbar spine: NA  T-Score forearm radius: -2.0  10-year probability of major osteoporotic fracture: 16.2%  10-year probability of hip fracture: 5.0% -Patient is a candidate for  pharmacologic treatment due to T-Score -1.0 to -2.5 and 10-year risk of hip fracture > 3% -Current treatment  Calcium Carbonate 600 mg daily  Ergocalciferol 50,000 units monthly -Medications previously tried: NA  -Recommend 1200 mg of calcium daily from dietary and supplemental sources. -Patient is a candidate for pharmacological treatment given hip fracture risk. Discussed possibility of starting a bisphosphonate but will defer until after upcoming surgery.  -Recommended to continue current medication  IBS (Goal: Improve symptoms) -Controlled -Current treatment  Dicylcomine 10 mg four times daily  Florastor 250 mg 2 caps twice daily  -Medications previously tried: Linzess (cost)  -Recommended to continue current medication  Osteoarthritis (Goal: Improve symptoms ) -Controlled -Current treatment  Acetaminophen 500 mg 1-2 tablets nightly.  Meloxicam 7.5 mg daily  -Medications previously tried: NA  -Hip replacement scheduled for November  -Counseled on risks of long-term NSAID use -Recommended to continue current medication  Patient Goals/Self-Care Activities Patient will:  - check blood pressure weekly, document, and provide at future appointments  Follow Up Plan: Telephone follow up appointment with care management team member scheduled for:  04/13/2022 at 8:30 AM      Patient agreed to services and verbal consent obtained.   Patient verbalizes understanding of instructions provided today and agrees to view in [place].   Junius Argyle, PharmD, Para [place], CPP Clinical Pharmacist [facility]  [phone]

## 2021-10-20 ENCOUNTER — Other Ambulatory Visit: Payer: Self-pay | Admitting: Physician Assistant

## 2021-10-20 NOTE — Progress Notes (Signed)
Please enter orders for PAT visit scheduled for 10-31-21

## 2021-10-21 ENCOUNTER — Other Ambulatory Visit: Payer: Self-pay | Admitting: Physician Assistant

## 2021-10-21 DIAGNOSIS — M1611 Unilateral primary osteoarthritis, right hip: Secondary | ICD-10-CM

## 2021-10-24 DIAGNOSIS — M1611 Unilateral primary osteoarthritis, right hip: Secondary | ICD-10-CM

## 2021-10-24 DIAGNOSIS — I10 Essential (primary) hypertension: Secondary | ICD-10-CM | POA: Diagnosis not present

## 2021-10-28 NOTE — Patient Instructions (Signed)
DUE TO COVID-19 ONLY ONE VISITOR IS ALLOWED TO COME WITH YOU AND STAY IN THE WAITING ROOM ONLY DURING PRE OP AND PROCEDURE.   **NO VISITORS ARE ALLOWED IN THE SHORT STAY AREA OR RECOVERY ROOM!!**  IF YOU WILL BE ADMITTED INTO THE HOSPITAL YOU ARE ALLOWED ONLY TWO SUPPORT PEOPLE DURING VISITATION HOURS ONLY (7 AM -8PM)   The support person(s) must pass our screening, gel in and out, and wear a mask at all times, including in the patient's room. Patients must also wear a mask when staff or their support person are in the room. Visitors GUEST BADGE MUST BE WORN VISIBLY  One adult visitor may remain with you overnight and MUST be in the room by 8 P.M.  No visitors under the age of 32. Any visitor under the age of 19 must be accompanied by an adult.    COVID SWAB TESTING MUST BE COMPLETED ON: 11/02/21                          8A - 3P  **MUST PRESENT COMPLETED FORM AT TESTING SITE**    [place] [facility] [place] (backside of the building) You are not required to quarantine, however you are required to wear a well-fitted mask when you are out and around people not in your household.  Hand Hygiene often Do NOT share personal items Notify your provider if you are in close contact with someone who has COVID or you develop fever 100.4 or greater, new onset of sneezing, cough, sore throat, shortness of breath or body aches.  [place] [place], [address], must go inside of the hospital, NOT A DRIVE THRU!  (Must self quarantine after testing. Follow instructions on handout.)       Your procedure is scheduled on: 11/04/21   Report to [facility] Main Entrance    Report to admitting at : 7:00 AM   Call this number if you have problems the morning of surgery [phone]   Do not eat food :After Midnight.   May have liquids until : 6:45 AM   day of surgery  CLEAR LIQUID DIET  Foods Allowed                                                                      Foods Excluded  Water, Black Coffee and tea, regular and decaf                             liquids that you cannot  Plain Jell-O in any flavor  (No red)                                           see through such as: Fruit ices (not with fruit pulp)                                     milk, soups, orange juice  Iced Popsicles (No red)                                    All solid food                                   Apple juices Sports drinks like Gatorade (No red) Lightly seasoned clear broth or consume(fat free) Sugar  Sample Menu Breakfast                                Lunch                                     Supper Cranberry juice                    Beef broth                            Chicken broth Jell-O                                     Grape juice                           Apple juice Coffee or tea                        Jell-O                                      Popsicle                                                Coffee or tea                        Coffee or tea      Complete one Ensure drink the morning of surgery at : 6:45 AM      the day of surgery.  The day of surgery:  Drink ONE (1) Pre-Surgery Clear Ensure or G2 by am the morning of surgery. Drink in one sitting. Do not sip.  This drink was given to you during your hospital  pre-op appointment visit. Nothing else to drink after completing the  Pre-Surgery Clear Ensure or G2.          If you have questions, please contact your surgeon's office.    Oral Hygiene is also important to reduce your risk of infection.                                    Remember - BRUSH YOUR TEETH THE MORNING OF SURGERY WITH YOUR REGULAR TOOTHPASTE   Do NOT smoke after Midnight   Take these medicines the morning of  surgery with A SIP OF WATER: bupropion,florastor,amlodipine,dicyclomine.  DO NOT TAKE ANY ORAL DIABETIC MEDICATIONS DAY OF YOUR SURGERY                              You  may not have any metal on your body including hair pins, jewelry, and body piercing             Do not wear make-up, lotions, powders, perfumes/cologne, or deodorant  Do not wear nail polish including gel and S&S, artificial/acrylic nails, or any other type of covering on natural nails including finger and toenails. If you have artificial nails, gel coating, etc. that needs to be removed by a nail salon please have this removed prior to surgery or surgery may need to be canceled/ delayed if the surgeon/ anesthesia feels like they are unable to be safely monitored.   Do not shave  48 hours prior to surgery.    Do not bring valuables to the hospital. [place].   Contacts, dentures or bridgework may not be worn into surgery.   Bring small overnight bag day of surgery.    Patients discharged on the day of surgery will not be allowed to drive home.   Special Instructions: Bring a copy of your healthcare power of attorney and living will documents         the day of surgery if you haven't scanned them before.              Please read over the following fact sheets you were given: IF YOU HAVE QUESTIONS ABOUT YOUR PRE-OP INSTRUCTIONS PLEASE CALL [phone]     [facility] - Preparing for Surgery Before surgery, you can play an important role.  Because skin is not sterile, your skin needs to be as free of germs as possible.  You can reduce the number of germs on your skin by washing with CHG (chlorahexidine gluconate) soap before surgery.  CHG is an antiseptic cleaner which kills germs and bonds with the skin to continue killing germs even after washing. Please DO NOT use if you have an allergy to CHG or antibacterial soaps.  If your skin becomes reddened/irritated stop using the CHG and inform your nurse when you arrive at Short Stay. Do not shave (including legs and underarms) for at least 48 hours prior to the first CHG shower.  You may shave your  face/neck. Please follow these instructions carefully:  1.  Shower with CHG Soap the night before surgery and the  morning of Surgery.  2.  If you choose to wash your hair, wash your hair first as usual with your  normal  shampoo.  3.  After you shampoo, rinse your hair and body thoroughly to remove the  shampoo.                           4.  Use CHG as you would any other liquid soap.  You can apply chg directly  to the skin and wash                       Gently with a scrungie or clean washcloth.  5.  Apply the CHG Soap to your body ONLY FROM THE NECK DOWN.   Do not use on  face/ open                           Wound or open sores. Avoid contact with eyes, ears mouth and genitals (private parts).                       Wash face,  Genitals (private parts) with your normal soap.             6.  Wash thoroughly, paying special attention to the area where your surgery  will be performed.  7.  Thoroughly rinse your body with warm water from the neck down.  8.  DO NOT shower/wash with your normal soap after using and rinsing off  the CHG Soap.                9.  Pat yourself dry with a clean towel.            10.  Wear clean pajamas.            11.  Place clean sheets on your bed the night of your first shower and do not  sleep with pets. Day of Surgery : Do not apply any lotions/deodorants the morning of surgery.  Please wear clean clothes to the hospital/surgery center.  FAILURE TO FOLLOW THESE INSTRUCTIONS MAY RESULT IN THE CANCELLATION OF YOUR SURGERY PATIENT SIGNATURE_________________________________  NURSE SIGNATURE__________________________________  ________________________________________________________________________   Adam Phenix  An incentive spirometer is a tool that can help keep your lungs clear and active. This tool measures how well you are filling your lungs with each breath. Taking long deep breaths may help reverse or decrease the chance of developing breathing  (pulmonary) problems (especially infection) following: A long period of time when you are unable to move or be active. BEFORE THE PROCEDURE  If the spirometer includes an indicator to show your best effort, your nurse or respiratory therapist will set it to a desired goal. If possible, sit up straight or lean slightly forward. Try not to slouch. Hold the incentive spirometer in an upright position. INSTRUCTIONS FOR USE  Sit on the edge of your bed if possible, or sit up as far as you can in bed or on a chair. Hold the incentive spirometer in an upright position. Breathe out normally. Place the mouthpiece in your mouth and seal your lips tightly around it. Breathe in slowly and as deeply as possible, raising the piston or the ball toward the top of the column. Hold your breath for 3-5 seconds or for as long as possible. Allow the piston or ball to fall to the bottom of the column. Remove the mouthpiece from your mouth and breathe out normally. Rest for a few seconds and repeat Steps 1 through 7 at least 10 times every 1-2 hours when you are awake. Take your time and take a few normal breaths between deep breaths. The spirometer may include an indicator to show your best effort. Use the indicator as a goal to work toward during each repetition. After each set of 10 deep breaths, practice coughing to be sure your lungs are clear. If you have an incision (the cut made at the time of surgery), support your incision when coughing by placing a pillow or rolled up towels firmly against it. Once you are able to get out of bed, walk around indoors and cough well. You may stop using the incentive spirometer when instructed by your  caregiver.  RISKS AND COMPLICATIONS Take your time so you do not get dizzy or light-headed. If you are in pain, you may need to take or ask for pain medication before doing incentive spirometry. It is harder to take a deep breath if you are having pain. AFTER USE Rest and  breathe slowly and easily. It can be helpful to keep track of a log of your progress. Your caregiver can provide you with a simple table to help with this. If you are using the spirometer at home, follow these instructions: [place] IF:  You are having difficultly using the spirometer. You have trouble using the spirometer as often as instructed. Your pain medication is not giving enough relief while using the spirometer. You develop fever of 100.5 F (38.1 C) or higher. SEEK IMMEDIATE MEDICAL CARE IF:  You cough up bloody sputum that had not been present before. You develop fever of 102 F (38.9 C) or greater. You develop worsening pain at or near the incision site. MAKE SURE YOU:  Understand these instructions. Will watch your condition. Will get help right away if you are not doing well or get worse. Document Released: 04/23/2007 Document Revised: 03/04/2012 Document Reviewed: 06/24/2007 [facility] Patient Information 2014 [place], [place].   ________________________________________________________________________

## 2021-10-31 ENCOUNTER — Other Ambulatory Visit: Payer: Self-pay

## 2021-10-31 ENCOUNTER — Encounter (HOSPITAL_COMMUNITY)
Admission: RE | Admit: 2021-10-31 | Discharge: 2021-10-31 | Disposition: A | Discharge status: Home / Self Care | Payer: Medicare Other | Source: Ambulatory Visit | Attending: Orthopaedic Surgery | Admitting: Orthopaedic Surgery

## 2021-10-31 ENCOUNTER — Encounter (HOSPITAL_COMMUNITY): Payer: Self-pay

## 2021-10-31 VITALS — BP 157/83 | HR 78 | Temp 98.1°F | Resp 18 | Ht 59.5 in | Wt 152.2 lb

## 2021-10-31 DIAGNOSIS — G4733 Obstructive sleep apnea (adult) (pediatric): Secondary | ICD-10-CM | POA: Insufficient documentation

## 2021-10-31 DIAGNOSIS — Z01818 Encounter for other preprocedural examination: Secondary | ICD-10-CM | POA: Diagnosis not present

## 2021-10-31 DIAGNOSIS — R011 Cardiac murmur, unspecified: Secondary | ICD-10-CM | POA: Insufficient documentation

## 2021-10-31 DIAGNOSIS — M1611 Unilateral primary osteoarthritis, right hip: Secondary | ICD-10-CM | POA: Insufficient documentation

## 2021-10-31 DIAGNOSIS — I10 Essential (primary) hypertension: Secondary | ICD-10-CM | POA: Diagnosis not present

## 2021-10-31 HISTORY — DX: Malignant (primary) neoplasm, unspecified: C80.1

## 2021-10-31 HISTORY — DX: Cardiac murmur, unspecified: R01.1

## 2021-10-31 HISTORY — DX: Essential (primary) hypertension: I10

## 2021-10-31 LAB — CBC
HCT: 40.3 % (ref 36.0–46.0)
Hemoglobin: 13.2 g/dL (ref 12.0–15.0)
MCH: 29.9 pg (ref 26.0–34.0)
MCHC: 32.8 g/dL (ref 30.0–36.0)
MCV: 91.4 fL (ref 80.0–100.0)
Platelets: 179 10*3/uL (ref 150–400)
RBC: 4.41 MIL/uL (ref 3.87–5.11)
RDW: 12.9 % (ref 11.5–15.5)
WBC: 5 10*3/uL (ref 4.0–10.5)
nRBC: 0 % (ref 0.0–0.2)

## 2021-10-31 LAB — BASIC METABOLIC PANEL
Anion gap: 8 (ref 5–15)
BUN: 14 mg/dL (ref 8–23)
CO2: 27 mmol/L (ref 22–32)
Calcium: 9.2 mg/dL (ref 8.9–10.3)
Chloride: 106 mmol/L (ref 98–111)
Creatinine, Ser: 0.62 mg/dL (ref 0.44–1.00)
GFR, Estimated: >60 mL/min (ref >60)
Glucose, Bld: 101 mg/dL — ABNORMAL HIGH (ref 70–99)
Potassium: 4 mmol/L (ref 3.5–5.1)
Sodium: 141 mmol/L (ref 135–145)

## 2021-10-31 LAB — SURGICAL PCR SCREEN
MRSA, PCR: NEGATIVE
Staphylococcus aureus: NEGATIVE

## 2021-10-31 NOTE — Progress Notes (Addendum)
COVID Vaccine Completed: Yes Date COVID Vaccine completed: 02/11/20 x 2 COVID vaccine manufacturer: Pfizer     COVID Test: 11/02/21  PCP - DO: Garvin Fila Cirigliano Cardiologist - Dr. Jenkins Rouge. LOV: 10/07/20  Chest x-ray - 08/16/21 EKG -  Stress Test -  ECHO - 07/21/19 Cardiac Cath -  Pacemaker/ICD device last checked:  Sleep Study - Yes CPAP - Yes  Fasting Blood Sugar -  Checks Blood Sugar _____ times a day  Blood Thinner Instructions: Aspirin Instructions: Last Dose:  Anesthesia review: Hx: HTN,OSA(CPAP),Heart murmur  Patient denies shortness of breath, fever, cough and chest pain at PAT appointment   Patient verbalized understanding of instructions that were given to them at the PAT appointment. Patient was also instructed that they will need to review over the PAT instructions again at home before surgery.

## 2021-11-01 NOTE — Progress Notes (Signed)
Anesthesia Chart Review   Case: [id number] Date/Time: 11/04/21 0930   Procedure: RIGHT TOTAL HIP ARTHROPLASTY ANTERIOR APPROACH (Right: Hip)   Anesthesia type: Spinal   Pre-op diagnosis: osteoarthritis right hip   Location: [facility] ROOM 09 / [facility]   Surgeons: Mcarthur Rossetti, MD       DISCUSSION:79 y.o. never smoker with h/o PONV, HTN, OSA on cpap, chronic cough, right hip OA scheduled for above procedure 11/04/21 with Dr. Jean Rosenthal.   Pt followed by cardiology for murmur, mild MR on Echo, pt asymptomatic. Last seen by cardiology 10/07/2020, stable at this visit.    Anticipate pt can proceed with planned procedure barring acute status change.   VS: BP (!) 157/83   Pulse 78   Temp 36.7 C (Oral)   Resp 18   Ht 4' 11.5" (1.511 m)   Wt 69.1 kg   LMP 12/25/1986 (Approximate)   SpO2 100%   BMI 30.24 kg/m   PROVIDERS: Haydee Salter, MD is PCP   Jenkins Rouge, MD is Cardiologist  LABS: Labs reviewed: Acceptable for surgery. (all labs ordered are listed, but only abnormal results are displayed)  Labs Reviewed  BASIC METABOLIC PANEL - Abnormal; Notable for the following components:      Result Value   Glucose, Bld 101 (*)    All other components within normal limits  SURGICAL PCR SCREEN  CBC  TYPE AND SCREEN     IMAGES:   EKG: 10/31/2021 Rate 76 bpm  NSR No significant change since last tracing   CV: Echo 07/20/2021  1. The left ventricle has normal systolic function with an ejection  fraction of 60-65%. The cavity size was normal. Left ventricular diastolic  Doppler parameters are consistent with impaired relaxation.   2. The interatrial septum appears to be lipomatous.   3. The right ventricle has normal systolic function. The cavity was  normal.   4. The mitral valve is abnormal. There is mild mitral annular  calcification present.   5. The tricuspid valve is grossly normal.   6. The aortic valve is tricuspid. Mild thickening of the aortic  valve. No  stenosis of the aortic valve.   7. The aorta is normal in size and structure.   8. Normal LV systolic function; mild diastolic dysfunction; mild MR and  TR.  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   PONV (postoperative nausea and vomiting)     Past Surgical History:  Procedure Laterality Date   ABDOMINAL HYSTERECTOMY  1988   secondary to prolapse   BLADDER SUSPENSION     A-P with Hyst   INSERTION OF MESH  10/16/2018   Procedure: INSERTION OF MESH;  Surgeon: Ardis Hughs, MD;  Location: [facility];  Service: Urology;;   NASAL SINUS SURGERY  1970   ROBOTIC ASSISTED LAPAROSCOPIC SACROCOLPOPEXY N/A 10/16/2018   Procedure: XI ROBOTIC ASSISTED LAPAROSCOPIC SACROCOLPOPEXY;  Surgeon: Ardis Hughs, MD;  Location: [facility];  Service: Urology;  Laterality: N/A;   TONSILLECTOMY      MEDICATIONS:  acetaminophen (TYLENOL) 500 MG tablet   amLODipine (NORVASC) 5 MG tablet   Biotin 10000 MCG TABS  buPROPion (WELLBUTRIN) 75 MG tablet   calcium carbonate (OSCAL) 1500 (600 Ca) MG TABS tablet   Carboxymethylcellul-Glycerin (LUBRICATING EYE DROPS OP)   Cyanocobalamin (VITAMIN B-12) 500 MCG LOZG   dicyclomine (BENTYL) 10 MG capsule   meloxicam (MOBIC) 7.5 MG tablet   polyethylene glycol (MIRALAX / GLYCOLAX) 17 g packet   pyridOXINE (VITAMIN B-6) 100 MG tablet   rosuvastatin (CRESTOR) 5 MG tablet   saccharomyces boulardii (FLORASTOR) 250 MG capsule   sodium chloride (OCEAN) 0.65 % SOLN nasal spray   vitamin C (ASCORBIC ACID) 500 MG tablet   Vitamin D, Ergocalciferol, (DRISDOL) 1.25 MG (50000 UNIT) CAPS capsule   No current facility-administered medications for this encounter.     Konrad Felix Ward, PA-C [facility]  Pre-Surgical Testing [phone]

## 2021-11-02 ENCOUNTER — Other Ambulatory Visit: Payer: Self-pay | Admitting: Orthopaedic Surgery

## 2021-11-02 LAB — SARS CORONAVIRUS 2 (TAT 6-24 HRS): SARS Coronavirus 2: NEGATIVE

## 2021-11-03 NOTE — H&P (Signed)
TOTAL HIP ADMISSION H&P  Patient is admitted for right total hip arthroplasty.  Subjective:  Chief Complaint: right hip pain  HPI: Gloria Werner, 80 y.o. female, has a history of pain and functional disability in the right hip(s) due to arthritis and patient has failed non-surgical conservative treatments for greater than 12 weeks to include NSAID's and/or analgesics, corticosteriod injections, flexibility and strengthening excercises, use of assistive devices, and activity modification.  Onset of symptoms was gradual starting 2 years ago with gradually worsening course since that time.The patient noted no past surgery on the right hip(s).  Patient currently rates pain in the right hip at 10 out of 10 with activity. Patient has night pain, worsening of pain with activity and weight bearing, pain that interfers with activities of daily living, and pain with passive range of motion. Patient has evidence of subchondral sclerosis, periarticular osteophytes, and joint space narrowing by imaging studies. This condition presents safety issues increasing the risk of falls.  There is no current active infection.  Patient Active Problem List   Diagnosis Date Noted   Unilateral primary osteoarthritis, 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   Medication side effect 08/03/2020   DDD (degenerative disc disease), lumbar 09/28/2019   Aortic atherosclerosis ([place]) 09/28/2019   Rib pain 09/16/2019   Right lumbar pain 09/16/2019   OSA (obstructive sleep apnea) 01/27/2019   Pulmonary nodule 01/27/2019   Allergic rhinitis 11/06/2018   Upper airway cough syndrome 09/24/2018   GERD (gastroesophageal reflux disease) 09/04/2018   De Quervain's syndrome (tenosynovitis) 07/03/2018   Essential hypertension 06/03/2018   Seasonal affective disorder ([place]) 01/17/2017   Vitamin D deficiency 22/63/3354   Lichen simplex chronicus 04/08/2015   Osteopenia 04/08/2015   Depression 03/05/2015   ALLERGIC RHINITIS, SEASONAL 02/03/2008    IRRITABLE BOWEL SYNDROME 02/03/2008   Past Medical History:  Diagnosis Date   Allergic rhinitis due to pollen    Cancer [facility])    Cataract    NS OU   Complication of anesthesia    Cough    Gastroesophageal reflux disease without esophagitis    Heart murmur    Hypertension    Irritable bowel syndrome    Lichen simplex chronicus 09/2004   PONV (postoperative nausea and vomiting)     Past Surgical History:  Procedure Laterality Date   ABDOMINAL HYSTERECTOMY  1988   secondary to prolapse   BLADDER SUSPENSION     A-P with Hyst   INSERTION OF MESH  10/16/2018   Procedure: INSERTION OF MESH;  Surgeon: Ardis Hughs, MD;  Location: [facility];  Service: Urology;;   NASAL SINUS SURGERY  1970   ROBOTIC ASSISTED LAPAROSCOPIC SACROCOLPOPEXY N/A 10/16/2018   Procedure: XI ROBOTIC ASSISTED LAPAROSCOPIC SACROCOLPOPEXY;  Surgeon: Ardis Hughs, MD;  Location: [facility];  Service: Urology;  Laterality: N/A;   TONSILLECTOMY      No current facility-administered medications for this encounter.   Current Outpatient Medications  Medication Sig Dispense Refill Last Dose   acetaminophen (TYLENOL) 500 MG tablet Take 500-1,000 mg by mouth at bedtime as needed for moderate pain.      amLODipine (NORVASC) 5 MG tablet Take 1 tablet (5 mg total) by mouth daily. 90 tablet 3    Biotin 10000 MCG TABS Take 10,000 mcg by mouth daily.      buPROPion (WELLBUTRIN) 75 MG tablet Take 1 tablet (75 mg total) by mouth 2 (two) times daily. (Patient taking differently: Take 75 mg by mouth daily.) 180 tablet 3    calcium carbonate (  OSCAL) 1500 (600 Ca) MG TABS tablet Take 600 mg of elemental calcium by mouth daily with breakfast.      Carboxymethylcellul-Glycerin (LUBRICATING EYE DROPS OP) Place 1 drop into both eyes daily as needed (dry eyes).      Cyanocobalamin (VITAMIN B-12) 500 MCG LOZG Take 1,000 mcg by mouth daily.      dicyclomine (BENTYL) 10 MG capsule Take 1 capsule (10 mg total) by mouth 4 (four) times  daily -  before meals and at bedtime. (Patient taking differently: Take 10 mg by mouth 4 (four) times daily as needed for spasms.) 120 capsule 0    meloxicam (MOBIC) 7.5 MG tablet Take 1 tablet (7.5 mg total) by mouth daily. 90 tablet 1    polyethylene glycol (MIRALAX / GLYCOLAX) 17 g packet Take 17 g by mouth daily. Takes an extra scoop in the evening as needed for moderate-severe constipation.      pyridOXINE (VITAMIN B-6) 100 MG tablet Take 100 mg by mouth daily.      rosuvastatin (CRESTOR) 5 MG tablet Take 1 tablet (5 mg total) by mouth 3 (three) times a week. 36 tablet 1    saccharomyces boulardii (FLORASTOR) 250 MG capsule Take 2 capsules (500 mg total) by mouth 2 (two) times daily. (Patient taking differently: Take 250 mg by mouth daily.) 120 capsule 3    sodium chloride (OCEAN) 0.65 % SOLN nasal spray Place 1 spray into both nostrils 2 (two) times daily as needed for congestion.      Vitamin D, Ergocalciferol, (DRISDOL) 1.25 MG (50000 UNIT) CAPS capsule TAKE 1 CAPSULE BY MOUTH ONCE EVERY MONTH (Patient taking differently: Take 50,000 Units by mouth every 30 (thirty) days.) 3 capsule 0    vitamin C (ASCORBIC ACID) 500 MG tablet Take 500 mg by mouth daily.      Allergies  Allergen Reactions   Lexapro [Escitalopram] Diarrhea and Other (See Comments)    headache   Ace Inhibitors Cough   Codeine Nausea And Vomiting    Social History   Tobacco Use   Smoking status: Never   Smokeless tobacco: Never  Substance Use Topics   Alcohol use: No    Family History  Problem Relation Age of Onset   Emphysema Mother    Lymphoma Mother    Asthma Mother    Cancer Father 57       lung cancer   Coronary artery disease Brother    Cancer Brother        breast cancer   Breast cancer Brother    Cancer Brother        lung cancer   Diabetes Neg Hx    Hypertension Neg Hx    Colon cancer Neg Hx    Esophageal cancer Neg Hx    Rectal cancer Neg Hx    Stomach cancer Neg Hx      Review of Systems   All other systems reviewed and are negative.  Objective:  Physical Exam Vitals reviewed.  Constitutional:      Appearance: Normal appearance.  HENT:     Head: Normocephalic and atraumatic.  Eyes:     Extraocular Movements: Extraocular movements intact.     Pupils: Pupils are equal, round, and reactive to light.  Cardiovascular:     Rate and Rhythm: Normal rate and regular rhythm.     Pulses: Normal pulses.  Pulmonary:     Effort: Pulmonary effort is normal.  Abdominal:     Palpations: Abdomen is soft.  Musculoskeletal:  Cervical back: Normal range of motion and neck supple.     Right hip: Tenderness and bony tenderness present. Decreased range of motion. Decreased strength.  Neurological:     Mental Status: She is alert and oriented to person, place, and time.  Psychiatric:        Behavior: Behavior normal.    Vital signs in last 24 hours:    Labs:   Estimated body mass index is 30.24 kg/m as calculated from the following:   Height as of 10/31/21: 4' 11.5" (1.511 m).   Weight as of 10/31/21: 69.1 kg.   Imaging Review Plain radiographs demonstrate severe degenerative joint disease of the right hip(s). The bone quality appears to be good for age and reported activity level.      Assessment/Plan:  End stage arthritis, right hip(s)  The patient history, physical examination, clinical judgement of the provider and imaging studies are consistent with end stage degenerative joint disease of the right hip(s) and total hip arthroplasty is deemed medically necessary. The treatment options including medical management, injection therapy, arthroscopy and arthroplasty were discussed at length. The risks and benefits of total hip arthroplasty were presented and reviewed. The risks due to aseptic loosening, infection, stiffness, dislocation/subluxation,  thromboembolic complications and other imponderables were discussed.  The patient acknowledged the explanation, agreed to  proceed with the plan and consent was signed. Patient is being admitted for inpatient treatment for surgery, pain control, PT, OT, prophylactic antibiotics, VTE prophylaxis, progressive ambulation and ADL's and discharge planning.The patient is planning to be discharged home with home health services

## 2021-11-04 ENCOUNTER — Observation Stay (HOSPITAL_COMMUNITY)
Admission: RE | Admit: 2021-11-04 | Discharge: 2021-11-05 | Disposition: A | Discharge status: Home / Self Care | Payer: Medicare Other | Attending: Orthopaedic Surgery | Admitting: Orthopaedic Surgery

## 2021-11-04 ENCOUNTER — Encounter (HOSPITAL_COMMUNITY)
Admission: RE | Disposition: A | Discharge status: Home / Self Care | Payer: Self-pay | Source: Home / Self Care | Attending: Orthopaedic Surgery

## 2021-11-04 ENCOUNTER — Observation Stay (HOSPITAL_COMMUNITY): Payer: Medicare Other

## 2021-11-04 ENCOUNTER — Encounter (HOSPITAL_COMMUNITY): Payer: Self-pay | Admitting: Orthopaedic Surgery

## 2021-11-04 ENCOUNTER — Ambulatory Visit (HOSPITAL_COMMUNITY): Payer: Medicare Other

## 2021-11-04 ENCOUNTER — Other Ambulatory Visit: Payer: Self-pay

## 2021-11-04 ENCOUNTER — Ambulatory Visit (HOSPITAL_COMMUNITY): Payer: Medicare Other | Admitting: Physician Assistant

## 2021-11-04 ENCOUNTER — Ambulatory Visit (HOSPITAL_COMMUNITY): Payer: Medicare Other | Admitting: Certified Registered"

## 2021-11-04 DIAGNOSIS — Z471 Aftercare following joint replacement surgery: Secondary | ICD-10-CM | POA: Diagnosis not present

## 2021-11-04 DIAGNOSIS — K219 Gastro-esophageal reflux disease without esophagitis: Secondary | ICD-10-CM | POA: Diagnosis not present

## 2021-11-04 DIAGNOSIS — Z79899 Other long term (current) drug therapy: Secondary | ICD-10-CM | POA: Insufficient documentation

## 2021-11-04 DIAGNOSIS — Z96641 Presence of right artificial hip joint: Secondary | ICD-10-CM

## 2021-11-04 DIAGNOSIS — Z419 Encounter for procedure for purposes other than remedying health state, unspecified: Secondary | ICD-10-CM

## 2021-11-04 DIAGNOSIS — M1611 Unilateral primary osteoarthritis, right hip: Principal | ICD-10-CM | POA: Diagnosis present

## 2021-11-04 DIAGNOSIS — E559 Vitamin D deficiency, unspecified: Secondary | ICD-10-CM | POA: Diagnosis not present

## 2021-11-04 DIAGNOSIS — I10 Essential (primary) hypertension: Secondary | ICD-10-CM | POA: Diagnosis not present

## 2021-11-04 DIAGNOSIS — T8182XA Emphysema (subcutaneous) resulting from a procedure, initial encounter: Secondary | ICD-10-CM | POA: Diagnosis not present

## 2021-11-04 DIAGNOSIS — Z859 Personal history of malignant neoplasm, unspecified: Secondary | ICD-10-CM | POA: Insufficient documentation

## 2021-11-04 HISTORY — PX: TOTAL HIP ARTHROPLASTY: SHX124

## 2021-11-04 LAB — TYPE AND SCREEN
ABO/RH(D): A POS
Antibody Screen: NEGATIVE

## 2021-11-04 SURGERY — ARTHROPLASTY, HIP, TOTAL, ANTERIOR APPROACH
Anesthesia: Spinal | Site: Hip | Laterality: Right

## 2021-11-04 MED ORDER — AMLODIPINE BESYLATE 5 MG PO TABS
5.0000 mg | ORAL_TABLET | Freq: Every day | ORAL | Status: DC
  Administered 2021-11-05: 5 mg via ORAL
  Filled 2021-11-04: qty 1

## 2021-11-04 MED ORDER — TRAMADOL HCL 50 MG PO TABS
50.0000 mg | ORAL_TABLET | Freq: Four times a day (QID) | ORAL | Status: DC
  Administered 2021-11-04 – 2021-11-05 (×2): 50 mg via ORAL
  Filled 2021-11-04 (×2): qty 1

## 2021-11-04 MED ORDER — CALCIUM CARBONATE 1250 (500 CA) MG PO TABS
500.0000 mg | ORAL_TABLET | Freq: Every day | ORAL | Status: DC
Start: 2021-11-05 — End: 2021-11-05
  Administered 2021-11-05: 500 mg via ORAL
  Filled 2021-11-04: qty 1

## 2021-11-04 MED ORDER — ASCORBIC ACID 500 MG PO TABS
500.0000 mg | ORAL_TABLET | Freq: Every day | ORAL | Status: DC
  Administered 2021-11-05: 500 mg via ORAL
  Filled 2021-11-04: qty 1

## 2021-11-04 MED ORDER — ALUM & MAG HYDROXIDE-SIMETH 200-200-20 MG/5ML PO SUSP: 30.0000 mL | ORAL | Status: DC | PRN

## 2021-11-04 MED ORDER — METOCLOPRAMIDE HCL 5 MG PO TABS: 5.0000 mg | ORAL_TABLET | Freq: Three times a day (TID) | ORAL | Status: DC | PRN

## 2021-11-04 MED ORDER — ONDANSETRON HCL 4 MG/2ML IJ SOLN: 4.0000 mg | Freq: Once | INTRAMUSCULAR | Status: DC | PRN

## 2021-11-04 MED ORDER — HYDROCODONE-ACETAMINOPHEN 7.5-325 MG PO TABS: 1.0000 | ORAL_TABLET | ORAL | Status: DC | PRN

## 2021-11-04 MED ORDER — MIDAZOLAM HCL 2 MG/2ML IJ SOLN
INTRAMUSCULAR | Status: DC | PRN
  Administered 2021-11-04: 1 mg via INTRAVENOUS

## 2021-11-04 MED ORDER — CHLORHEXIDINE GLUCONATE CLOTH 2 % EX PADS: 6.0000 | MEDICATED_PAD | Freq: Every day | CUTANEOUS | Status: DC

## 2021-11-04 MED ORDER — FENTANYL CITRATE (PF) 100 MCG/2ML IJ SOLN
INTRAMUSCULAR | Status: AC
  Filled 2021-11-04: qty 2

## 2021-11-04 MED ORDER — PHENYLEPHRINE HCL-NACL 20-0.9 MG/250ML-% IV SOLN
INTRAVENOUS | Status: DC | PRN
  Administered 2021-11-04: 20 ug/min via INTRAVENOUS

## 2021-11-04 MED ORDER — 0.9 % SODIUM CHLORIDE (POUR BTL) OPTIME
TOPICAL | Status: DC | PRN
  Administered 2021-11-04: 1000 mL

## 2021-11-04 MED ORDER — ACETAMINOPHEN 325 MG PO TABS: 325.0000 mg | ORAL_TABLET | Freq: Four times a day (QID) | ORAL | Status: DC | PRN

## 2021-11-04 MED ORDER — LIDOCAINE 2% (20 MG/ML) 5 ML SYRINGE
INTRAMUSCULAR | Status: DC | PRN
  Administered 2021-11-04: 40 mg via INTRAVENOUS

## 2021-11-04 MED ORDER — DOCUSATE SODIUM 100 MG PO CAPS
100.0000 mg | ORAL_CAPSULE | Freq: Two times a day (BID) | ORAL | Status: DC
  Administered 2021-11-04 – 2021-11-05 (×2): 100 mg via ORAL
  Filled 2021-11-04 (×2): qty 1

## 2021-11-04 MED ORDER — VITAMIN B-12 1000 MCG PO TABS
1000.0000 ug | ORAL_TABLET | Freq: Every day | ORAL | Status: DC
  Administered 2021-11-05: 1000 ug via ORAL
  Filled 2021-11-04: qty 1

## 2021-11-04 MED ORDER — BUPROPION HCL 75 MG PO TABS
75.0000 mg | ORAL_TABLET | Freq: Every day | ORAL | Status: DC
  Administered 2021-11-05: 75 mg via ORAL
  Filled 2021-11-04: qty 1

## 2021-11-04 MED ORDER — SACCHAROMYCES BOULARDII 250 MG PO CAPS
250.0000 mg | ORAL_CAPSULE | Freq: Every day | ORAL | Status: DC
  Administered 2021-11-05: 250 mg via ORAL
  Filled 2021-11-04: qty 1

## 2021-11-04 MED ORDER — METOCLOPRAMIDE HCL 5 MG/ML IJ SOLN: 5.0000 mg | Freq: Three times a day (TID) | INTRAMUSCULAR | Status: DC | PRN

## 2021-11-04 MED ORDER — CEFAZOLIN SODIUM-DEXTROSE 2-4 GM/100ML-% IV SOLN
2.0000 g | INTRAVENOUS | Status: AC
  Administered 2021-11-04: 2 g via INTRAVENOUS
  Filled 2021-11-04: qty 100

## 2021-11-04 MED ORDER — OXYCODONE HCL 5 MG PO TABS: 5.0000 mg | ORAL_TABLET | Freq: Once | ORAL | Status: DC | PRN

## 2021-11-04 MED ORDER — LACTATED RINGERS IV SOLN: INTRAVENOUS | Status: DC

## 2021-11-04 MED ORDER — SODIUM CHLORIDE 0.9 % IR SOLN
Status: DC | PRN
  Administered 2021-11-04: 1000 mL

## 2021-11-04 MED ORDER — CHLORHEXIDINE GLUCONATE 0.12 % MT SOLN: 15.0000 mL | Freq: Once | OROMUCOSAL | Status: AC

## 2021-11-04 MED ORDER — ONDANSETRON HCL 4 MG/2ML IJ SOLN
INTRAMUSCULAR | Status: DC | PRN
  Administered 2021-11-04: 4 mg via INTRAVENOUS

## 2021-11-04 MED ORDER — OXYCODONE HCL 5 MG/5ML PO SOLN: 5.0000 mg | Freq: Once | ORAL | Status: DC | PRN

## 2021-11-04 MED ORDER — FENTANYL CITRATE PF 50 MCG/ML IJ SOSY: 25.0000 ug | PREFILLED_SYRINGE | INTRAMUSCULAR | Status: DC | PRN

## 2021-11-04 MED ORDER — CEFAZOLIN SODIUM-DEXTROSE 1-4 GM/50ML-% IV SOLN
1.0000 g | Freq: Four times a day (QID) | INTRAVENOUS | Status: AC
  Administered 2021-11-04 (×2): 1 g via INTRAVENOUS
  Filled 2021-11-04 (×2): qty 50

## 2021-11-04 MED ORDER — PHENOL 1.4 % MT LIQD: 1.0000 | OROMUCOSAL | Status: DC | PRN

## 2021-11-04 MED ORDER — POVIDONE-IODINE 10 % EX SWAB
2.0000 "application " | Freq: Once | CUTANEOUS | Status: AC
  Administered 2021-11-04: 2 via TOPICAL

## 2021-11-04 MED ORDER — HYDROCODONE-ACETAMINOPHEN 5-325 MG PO TABS
1.0000 | ORAL_TABLET | ORAL | Status: DC | PRN
  Administered 2021-11-05: 1 via ORAL
  Filled 2021-11-04: qty 1

## 2021-11-04 MED ORDER — MORPHINE SULFATE (PF) 2 MG/ML IV SOLN: 0.5000 mg | INTRAVENOUS | Status: DC | PRN

## 2021-11-04 MED ORDER — PROPOFOL 500 MG/50ML IV EMUL
INTRAVENOUS | Status: DC | PRN
  Administered 2021-11-04: 65 ug/kg/min via INTRAVENOUS

## 2021-11-04 MED ORDER — BIOTIN 10000 MCG PO TABS: 10000.0000 ug | ORAL_TABLET | Freq: Every day | ORAL | Status: DC

## 2021-11-04 MED ORDER — BUPIVACAINE IN DEXTROSE 0.75-8.25 % IT SOLN
INTRATHECAL | Status: DC | PRN
  Administered 2021-11-04: 1.4 mL via INTRATHECAL

## 2021-11-04 MED ORDER — ASPIRIN 81 MG PO CHEW
81.0000 mg | CHEWABLE_TABLET | Freq: Two times a day (BID) | ORAL | Status: DC
  Administered 2021-11-04 – 2021-11-05 (×2): 81 mg via ORAL
  Filled 2021-11-04 (×2): qty 1

## 2021-11-04 MED ORDER — TRANEXAMIC ACID-NACL 1000-0.7 MG/100ML-% IV SOLN
1000.0000 mg | INTRAVENOUS | Status: AC
  Administered 2021-11-04: 1000 mg via INTRAVENOUS
  Filled 2021-11-04: qty 100

## 2021-11-04 MED ORDER — PROPOFOL 1000 MG/100ML IV EMUL
INTRAVENOUS | Status: AC
  Filled 2021-11-04: qty 100

## 2021-11-04 MED ORDER — METHOCARBAMOL 500 MG PO TABS
500.0000 mg | ORAL_TABLET | Freq: Four times a day (QID) | ORAL | Status: DC | PRN
  Administered 2021-11-04 – 2021-11-05 (×2): 500 mg via ORAL
  Filled 2021-11-04 (×2): qty 1

## 2021-11-04 MED ORDER — METHOCARBAMOL 500 MG IVPB - SIMPLE MED
500.0000 mg | Freq: Four times a day (QID) | INTRAVENOUS | Status: DC | PRN
  Filled 2021-11-04: qty 50

## 2021-11-04 MED ORDER — PROPOFOL 10 MG/ML IV BOLUS
INTRAVENOUS | Status: AC
  Filled 2021-11-04: qty 20

## 2021-11-04 MED ORDER — MENTHOL 3 MG MT LOZG: 1.0000 | LOZENGE | OROMUCOSAL | Status: DC | PRN

## 2021-11-04 MED ORDER — ONDANSETRON HCL 4 MG/2ML IJ SOLN: 4.0000 mg | Freq: Four times a day (QID) | INTRAMUSCULAR | Status: DC | PRN

## 2021-11-04 MED ORDER — ORAL CARE MOUTH RINSE
15.0000 mL | Freq: Once | OROMUCOSAL | Status: AC
  Administered 2021-11-04: 15 mL via OROMUCOSAL

## 2021-11-04 MED ORDER — PROPOFOL 10 MG/ML IV BOLUS
INTRAVENOUS | Status: DC | PRN
  Administered 2021-11-04: 20 mg via INTRAVENOUS

## 2021-11-04 MED ORDER — ONDANSETRON HCL 4 MG/2ML IJ SOLN
INTRAMUSCULAR | Status: AC
  Filled 2021-11-04: qty 2

## 2021-11-04 MED ORDER — DEXAMETHASONE SODIUM PHOSPHATE 10 MG/ML IJ SOLN
INTRAMUSCULAR | Status: DC | PRN
  Administered 2021-11-04: 8 mg via INTRAVENOUS

## 2021-11-04 MED ORDER — VITAMIN B-6 100 MG PO TABS
100.0000 mg | ORAL_TABLET | Freq: Every day | ORAL | Status: DC
  Administered 2021-11-05: 100 mg via ORAL
  Filled 2021-11-04: qty 1

## 2021-11-04 MED ORDER — DIPHENHYDRAMINE HCL 12.5 MG/5ML PO ELIX: 12.5000 mg | ORAL_SOLUTION | ORAL | Status: DC | PRN

## 2021-11-04 MED ORDER — SODIUM CHLORIDE 0.9 % IV SOLN: INTRAVENOUS | Status: DC

## 2021-11-04 MED ORDER — POLYETHYLENE GLYCOL 3350 17 G PO PACK: 17.0000 g | PACK | Freq: Every day | ORAL | Status: DC | PRN

## 2021-11-04 MED ORDER — DEXAMETHASONE SODIUM PHOSPHATE 10 MG/ML IJ SOLN
INTRAMUSCULAR | Status: AC
  Filled 2021-11-04: qty 1

## 2021-11-04 MED ORDER — ONDANSETRON HCL 4 MG PO TABS: 4.0000 mg | ORAL_TABLET | Freq: Four times a day (QID) | ORAL | Status: DC | PRN

## 2021-11-04 MED ORDER — MIDAZOLAM HCL 2 MG/2ML IJ SOLN
INTRAMUSCULAR | Status: AC
  Filled 2021-11-04: qty 2

## 2021-11-04 MED ORDER — PANTOPRAZOLE SODIUM 40 MG PO TBEC
40.0000 mg | DELAYED_RELEASE_TABLET | Freq: Every day | ORAL | Status: DC
  Administered 2021-11-04 – 2021-11-05 (×2): 40 mg via ORAL
  Filled 2021-11-04 (×2): qty 1

## 2021-11-04 SURGICAL SUPPLY — 44 items
APL SKNCLS STERI-STRIP NONHPOA (GAUZE/BANDAGES/DRESSINGS)
BAG COUNTER SPONGE SURGICOUNT (BAG) ×2 IMPLANT
BAG SPEC THK2 15X12 ZIP CLS (MISCELLANEOUS) ×1
BAG SPNG CNTER NS LX DISP (BAG) ×1
BAG ZIPLOCK 12X15 (MISCELLANEOUS) ×1 IMPLANT
BENZOIN TINCTURE PRP APPL 2/3 (GAUZE/BANDAGES/DRESSINGS) IMPLANT
BLADE SAW SGTL 18X1.27X75 (BLADE) ×2 IMPLANT
COVER PERINEAL POST (MISCELLANEOUS) ×2 IMPLANT
COVER SURGICAL LIGHT HANDLE (MISCELLANEOUS) ×2 IMPLANT
CUP ACET PINNACLE SECTR 48MM (Joint) IMPLANT
DRAPE FOOT SWITCH (DRAPES) ×2 IMPLANT
DRAPE STERI IOBAN 125X83 (DRAPES) ×2 IMPLANT
DRAPE U-SHAPE 47X51 STRL (DRAPES) ×4 IMPLANT
DRSG AQUACEL AG ADV 3.5X10 (GAUZE/BANDAGES/DRESSINGS) ×2 IMPLANT
DURAPREP 26ML APPLICATOR (WOUND CARE) ×2 IMPLANT
ELECT REM PT RETURN 15FT ADLT (MISCELLANEOUS) ×2 IMPLANT
GAUZE XEROFORM 1X8 LF (GAUZE/BANDAGES/DRESSINGS) ×2 IMPLANT
GLOVE SRG 8 PF TXTR STRL LF DI (GLOVE) ×2 IMPLANT
GLOVE SURG ENC MOIS LTX SZ7.5 (GLOVE) ×2 IMPLANT
GLOVE SURG NEOPR MICRO LF SZ8 (GLOVE) ×2 IMPLANT
GLOVE SURG UNDER POLY LF SZ8 (GLOVE) ×4
GOWN STRL REUS W/TWL XL LVL3 (GOWN DISPOSABLE) ×4 IMPLANT
HANDPIECE INTERPULSE COAX TIP (DISPOSABLE) ×2
HEAD FEM STD 32X+1 STRL (Hips) ×1 IMPLANT
HOLDER FOLEY CATH W/STRAP (MISCELLANEOUS) ×2 IMPLANT
KIT TURNOVER KIT A (KITS) IMPLANT
LINER ACET 32X48 (Liner) ×1 IMPLANT
PACK ANTERIOR HIP CUSTOM (KITS) ×2 IMPLANT
PENCIL SMOKE EVACUATOR (MISCELLANEOUS) IMPLANT
PINNSECTOR W/GRIP ACE CUP 48MM (Joint) ×2 IMPLANT
SCREW 6.5MMX25MM (Screw) ×1 IMPLANT
SET HNDPC FAN SPRY TIP SCT (DISPOSABLE) ×1 IMPLANT
STAPLER VISISTAT 35W (STAPLE) ×1 IMPLANT
STEM FEM ACTIS STD SZ4 (Stem) ×1 IMPLANT
STRIP CLOSURE SKIN 1/2X4 (GAUZE/BANDAGES/DRESSINGS) IMPLANT
SUT ETHIBOND NAB CT1 #1 30IN (SUTURE) ×2 IMPLANT
SUT ETHILON 2 0 PS N (SUTURE) IMPLANT
SUT MNCRL AB 4-0 PS2 18 (SUTURE) IMPLANT
SUT VIC AB 0 CT1 36 (SUTURE) ×2 IMPLANT
SUT VIC AB 1 CT1 36 (SUTURE) ×2 IMPLANT
SUT VIC AB 2-0 CT1 27 (SUTURE) ×4
SUT VIC AB 2-0 CT1 TAPERPNT 27 (SUTURE) ×2 IMPLANT
TRAY FOLEY MTR SLVR 14FR STAT (SET/KITS/TRAYS/PACK) ×1 IMPLANT
TRAY FOLEY MTR SLVR 16FR STAT (SET/KITS/TRAYS/PACK) IMPLANT

## 2021-11-04 NOTE — Brief Op Note (Signed)
11/04/2021  10:50 AM  PATIENT:  Gloria Werner  80 y.o. female  PRE-OPERATIVE DIAGNOSIS:  osteoarthritis right hip  POST-OPERATIVE DIAGNOSIS:  osteoarthritis right hip  PROCEDURE:  Procedure(s): RIGHT TOTAL HIP ARTHROPLASTY ANTERIOR APPROACH (Right)  SURGEON:  Surgeon(s) and Role:    Mcarthur Rossetti, MD - Primary  PHYSICIAN ASSISTANT:  Benita Stabile, PA-C  ANESTHESIA:   spinal  EBL:  150 mL   COUNTS:  YES  DICTATION: .Other Dictation: Dictation Number [id number]  PLAN OF CARE: Admit for overnight observation  PATIENT DISPOSITION:  PACU - hemodynamically stable.   Delay start of Pharmacological VTE agent (>24hrs) due to surgical blood loss or risk of bleeding: no

## 2021-11-04 NOTE — Anesthesia Postprocedure Evaluation (Signed)
Anesthesia Post Note  Patient: Gloria Werner  Procedure(s) Performed: RIGHT TOTAL HIP ARTHROPLASTY ANTERIOR APPROACH (Right: Hip)     Patient location during evaluation: PACU Anesthesia Type: Spinal Level of consciousness: awake and alert Pain management: pain level controlled Vital Signs Assessment: post-procedure vital signs reviewed and stable Respiratory status: spontaneous breathing and respiratory function stable Cardiovascular status: blood pressure returned to baseline and stable Postop Assessment: spinal receding and no apparent nausea or vomiting Anesthetic complications: no   No notable events documented.  Last Vitals:  Vitals:   11/04/21 1215 11/04/21 1249  BP: 129/75 (!) 147/68  Pulse: 74 71  Resp: 13 16  Temp: 36.6 C 36.7 C  SpO2: 100% 100%    Last Pain:  Vitals:   11/04/21 1249  TempSrc: Oral  PainSc: 0-No pain                 Audry Pili

## 2021-11-04 NOTE — Evaluation (Signed)
Physical Therapy Evaluation Patient Details Name: Gloria Werner MRN: [id number] DOB: Mar 30, 1941 Today's Date: 11/04/2021  History of Present Illness  Pt is 80 yo female s/p R anterior THA on 11/04/21.  She has hx including but not limited to arthritis, DDD, OSA, GERD, HTN, osteopenia,  Clinical Impression  Pt is s/p THA resulting in the deficits listed below (see PT Problem List). At baseline, pt is independent and lives with spouse.  She will have family support at d/c but needs a youth size RW.  Today, pt doing exceptional for POD #0.  She had no pain and was able ambulate 120' with mild gait deviations and min cues.  She is expected to progress very well.  Pt will benefit from skilled PT to increase their independence and safety with mobility to allow discharge to the venue listed below.         Recommendations for follow up therapy are one component of a multi-disciplinary discharge planning process, led by the attending physician.  Recommendations may be updated based on patient status, additional functional criteria and insurance authorization.  Follow Up Recommendations Follow physician's recommendations for discharge plan and follow up therapies    Assistance Recommended at Discharge Intermittent Supervision/Assistance  Functional Status Assessment Patient has had a recent decline in their functional status and demonstrates the ability to make significant improvements in function in a reasonable and predictable amount of time.  Equipment Recommendations  Other (comment) (Youth RW)    Recommendations for Other Services       Precautions / Restrictions Precautions Precautions: Fall Restrictions Weight Bearing Restrictions: Yes RLE Weight Bearing: Weight bearing as tolerated      Mobility  Bed Mobility Overal bed mobility: Needs Assistance Bed Mobility: Supine to Sit     Supine to sit: Min assist     General bed mobility comments: cues and light min A for R LE     Transfers Overall transfer level: Needs assistance Equipment used: Rolling walker (2 wheels) Transfers: Sit to/from Stand Sit to Stand: Min guard           General transfer comment: cues for hand placement and min guard to rise    Ambulation/Gait Ambulation/Gait assistance: Min guard Gait Distance (Feet): 120 Feet Assistive device: Rolling walker (2 wheels) Gait Pattern/deviations: Step-through pattern;Decreased stride length Gait velocity: decreased     General Gait Details: Near normal gait pattern with slight decrease in weight shift to R.  Did require initial cues for sequencing and cues for RW proximity and posture  Stairs            Wheelchair Mobility    Modified Rankin (Stroke Patients Only)       Balance Overall balance assessment: Needs assistance Sitting-balance support: No upper extremity supported Sitting balance-Leahy Scale: Good     Standing balance support: Bilateral upper extremity supported;No upper extremity supported Standing balance-Leahy Scale: Fair Standing balance comment: RW to ambulate; static stand no AD                             Pertinent Vitals/Pain Pain Assessment: No/denies pain    Home Living Family/patient expects to be discharged to:: Private residence Living Arrangements: Spouse/significant other Available Help at Discharge: Family;Available 24 hours/day Type of Home: House Home Access: Ramped entrance       Home Layout: One level Home Equipment: Cane - single point Additional Comments: Has ordered grab bars and toilet riser  Prior Function Prior Level of Function : Independent/Modified Independent;Driving             Mobility Comments: Could ambulate in community without AD ADLs Comments: Ind., with ADLS and IADLs     Hand Dominance        Extremity/Trunk Assessment   Upper Extremity Assessment Upper Extremity Assessment: Overall WFL for tasks assessed    Lower Extremity  Assessment Lower Extremity Assessment: LLE deficits/detail;RLE deficits/detail RLE Deficits / Details: ROM WFL; MMT 5/5 ankle, 3/5 hip and knee not further tested LLE Deficits / Details: ROM WFL; MM 5/5    Cervical / Trunk Assessment Cervical / Trunk Assessment: Kyphotic  Communication   Communication: No difficulties  Cognition Arousal/Alertness: Awake/alert Behavior During Therapy: WFL for tasks assessed/performed Overall Cognitive Status: Within Functional Limits for tasks assessed                                          General Comments      Exercises     Assessment/Plan    PT Assessment Patient needs continued PT services  PT Problem List Decreased strength;Decreased mobility;Decreased activity tolerance;Decreased balance;Decreased knowledge of use of DME       PT Treatment Interventions DME instruction;Therapeutic activities;Modalities;Gait training;Therapeutic exercise;Patient/family education;Stair training;Balance training;Functional mobility training    PT Goals (Current goals can be found in the Care Plan section)  Acute Rehab PT Goals Patient Stated Goal: return home PT Goal Formulation: With patient/family Time For Goal Achievement: 11/18/21 Potential to Achieve Goals: Good    Frequency 7X/week   Barriers to discharge        Co-evaluation               AM-PAC PT "6 Clicks" Mobility  Outcome Measure Help needed turning from your back to your side while in a flat bed without using bedrails?: A Little Help needed moving from lying on your back to sitting on the side of a flat bed without using bedrails?: A Little Help needed moving to and from a bed to a chair (including a wheelchair)?: A Little Help needed standing up from a chair using your arms (e.g., wheelchair or bedside chair)?: A Little Help needed to walk in hospital room?: A Little Help needed climbing 3-5 steps with a railing? : A Little 6 Click Score: 18    End of  Session Equipment Utilized During Treatment: Gait belt Activity Tolerance: Patient tolerated treatment well Patient left: in chair;with call bell/phone within reach;with chair alarm set;with family/visitor present Nurse Communication: Mobility status PT Visit Diagnosis: Other abnormalities of gait and mobility (R26.89);Muscle weakness (generalized) (M62.81)    Time: 6759-1638 PT Time Calculation (min) (ACUTE ONLY): 26 min   Charges:   PT Evaluation $PT Eval Low Complexity: 1 Low PT Treatments $Gait Training: 8-22 mins        Abran Richard, PT Acute Rehab Services Pager [phone] [facility] 11/04/2021, 3:13 PM

## 2021-11-04 NOTE — Anesthesia Procedure Notes (Signed)
Procedure Name: MAC Date/Time: 11/04/2021 9:36 AM Performed by: Eben Burow, CRNA Pre-anesthesia Checklist: Patient identified, Emergency Drugs available, Suction available, Patient being monitored and Timeout performed Oxygen Delivery Method: Simple face mask Placement Confirmation: positive ETCO2

## 2021-11-04 NOTE — Plan of Care (Signed)
  Problem: Activity: Goal: Ability to avoid complications of mobility impairment will improve Outcome: Progressing Goal: Ability to tolerate increased activity will improve Outcome: Progressing   Problem: Pain Management: Goal: Pain level will decrease with appropriate interventions Outcome: Progressing   

## 2021-11-04 NOTE — Interval H&P Note (Signed)
History and Physical Interval Note: The patient understands that she is here today for a right total hip replacement to treat her right hip osteoarthritis.  The risks and benefits of surgery been explained in detail.  There is been no acute change in her medical status.  See recent H&P.  Informed consent was obtained and the right operative hip has been marked.  11/04/2021 8:36 AM  Gloria Werner  has presented today for surgery, with the diagnosis of osteoarthritis right hip.  The various methods of treatment have been discussed with the patient and family. After consideration of risks, benefits and other options for treatment, the patient has consented to  Procedure(s): RIGHT TOTAL HIP ARTHROPLASTY ANTERIOR APPROACH (Right) as a surgical intervention.  The patient's history has been reviewed, patient examined, no change in status, stable for surgery.  I have reviewed the patient's chart and labs.  Questions were answered to the patient's satisfaction.     Mcarthur Rossetti

## 2021-11-04 NOTE — Op Note (Signed)
NAME: Gloria Werner, Gloria Werner MEDICAL RECORD NO: [id number] ACCOUNT NO: [id number] DATE OF BIRTH: [birth year] FACILITY: [facility] LOCATION: [facility] PHYSICIAN: Lind Guest. Ninfa Linden, MD  Operative Report   DATE OF PROCEDURE: 11/04/2021  PREOPERATIVE DIAGNOSIS:  Primary osteoarthritis and degenerative joint disease, right hip.  POSTOPERATIVE DIAGNOSIS:  Primary osteoarthritis and degenerative joint disease, right hip.  PROCEDURE:  Right total hip arthroplasty through direct anterior approach.  IMPLANTS:  DePuy sector Gription acetabular component, size 48, single screw, size 32+0 neutral polyethylene liner, size 4 ACTIS femoral component with standard offset, size 32+1 metal hip ball.  SURGEON:  Lind Guest. Ninfa Linden, MD  ASSISTANT:  Erskine Emery, PA-C  ANESTHESIA:  Spinal.  ESTIMATED BLOOD LOSS:  150 mL.  ANTIBIOTICS:  2 g IV Ancef.  COMPLICATIONS:  None.  INDICATIONS:  The patient is a 80 year old female with debilitating arthritis involving her right hip and has been well documented.  Her pain is daily with right hip and it is detrimentally affecting her mobility, her quality of life and her activities  of daily living to the point she does wish to proceed with total hip arthroplasty on the right side and we have recommended this as well.  We talked in length and detail about the risk of acute blood loss anemia, nerve or vessel injury, fracture,  infection, dislocation, DVT, implant failure, skin and soft tissue issues and leg length differences.  We talked about our goals being decreased pain, improved mobility and overall improved quality of life.  DESCRIPTION OF PROCEDURE:  After informed consent was obtained, appropriate right hip was marked.  She was brought to the operating room and sat up on a stretcher where spinal anesthesia was obtained.  She was laid in a supine position on the stretcher  and traction boots were placed on both her feet.  Next, she was placed supine on the  Hana fracture table, the perineal post in place and both legs in line skeletal traction devices.  No traction applied.  Her right operative hip was prepped and draped  with DuraPrep and sterile drapes.  A timeout was called and she was identified correct patient, correct right hip.  I then made an incision just inferior and posterior to the anterior iliac spine and carried this obliquely down the leg.  We dissected  down tensor fascia lata muscle.  Tensor fascia was then divided longitudinally to proceed with direct anterior approach of the hip.  We identified and cauterized circumflex vessels and identified the hip capsule, opened up the hip capsule in L-type  format finding a moderate joint effusion and significant periarticular osteophytes around the lateral femoral head and neck.  We then made a femoral neck cut with an oscillating saw just proximal to the lesser trochanter and completed this with an  osteotome.  We placed a corkscrew guide in the femoral head and removed the femoral head in its entirety and found to be devoid of cartilage in a very wide area.  I then placed a bent Hohmann over the medial acetabular rim and removed remnants of  acetabular labrum and other debris.  We then began reaming under direct visualization from a size 42 reamer going up to a size 48 with line to line reaming.  All reamers placed under direct visualization.  The last reamer was also placed under direct  fluoroscopy, so I could obtain my depth of reaming my inclination and anteversion.  I then placed real DePuy sector Gription acetabular component, size 48 and we did place a  single screw due to sclerotic bone.  We then placed a 32+0 neutral polyethylene  liner for that size acetabular component.  Attention was then turned to the femur.  With the leg externally rotated to 120 degrees and extended and adduct.  We placed a Mueller retractor medially and Hohman retractor behind the greater trochanter.  We  released  lateral joint capsule and used a box cutting osteotome to enter the femoral canal and a rongeur to lateralize, we then began broaching using the ACTIS broaching system from a size 0 going up to a size 4.  The size 4 in place, we trialed a  standard offset femoral neck and a 32+1 hip ball.  We brought the leg back over and up and with traction and internal rotation reducing the pelvis and we were pleased with range of motion, offset and stability as well as leg lengths assessed  radiographically and mechanically.  We then dislocated the hip, removed the trial components.  We placed the real ACTIS femoral component with standard offset size 4 and the real 32+1 metal hip ball.  Again we reduced this in the acetabulum.  We were  pleased with leg length, offset, range of motion and stability.  We then irrigated the soft tissue with normal saline solution using pulsatile lavage.  We reapproximated the joint capsule with interrupted #1 Ethibond suture followed by #1 Vicryl to close  the tensor fascia.  0 Vicryl was used to close deep tissue and 2-0 Vicryl was used to close subcutaneous tissue.  The skin was closed with staples.  Aquacel dressing was applied.  She was taken off the Hana table and taken to recovery room in stable  condition with all final counts being correct.  There are no complications noted.  Of note, Benita Stabile, PA-C did assist during the entire case and his assistance was crucial for facilitating every aspect of this case.   PUS D: 11/04/2021 10:49:10 am T: 11/04/2021 12:02:00 pm  JOB: [id number]/ [id number]

## 2021-11-04 NOTE — Transfer of Care (Signed)
Immediate Anesthesia Transfer of Care Note  Patient: Gloria Werner  Procedure(s) Performed: RIGHT TOTAL HIP ARTHROPLASTY ANTERIOR APPROACH (Right: Hip)  Patient Location: PACU  Anesthesia Type:Spinal  Level of Consciousness: drowsy and patient cooperative  Airway & Oxygen Therapy: Patient Spontanous Breathing and Patient connected to face mask oxygen  Post-op Assessment: Report given to RN and Post -op Vital signs reviewed and stable  Post vital signs: Reviewed and stable  Last Vitals:  Vitals Value Taken Time  BP 109/64 11/04/21 1115  Temp    Pulse 73 11/04/21 1116  Resp 18 11/04/21 1116  SpO2 98 % 11/04/21 1116  Vitals shown include unvalidated device data.  Last Pain:  Vitals:   11/04/21 0739  TempSrc: Oral         Complications: No notable events documented.

## 2021-11-04 NOTE — Anesthesia Preprocedure Evaluation (Addendum)
Anesthesia Evaluation  Patient identified by MRN, date of birth, ID band Patient awake    Reviewed: Allergy & Precautions, NPO status , Patient's Chart, lab work & pertinent test results  History of Anesthesia Complications (+) PONV and history of anesthetic complications  Airway Mallampati: II  TM Distance: >3 FB Neck ROM: Full    Dental  (+) Dental Advisory Given   Pulmonary sleep apnea and Continuous Positive Airway Pressure Ventilation ,    Pulmonary exam normal        Cardiovascular hypertension, Pt. on medications Normal cardiovascular exam     Neuro/Psych PSYCHIATRIC DISORDERS Depression negative neurological ROS     GI/Hepatic Neg liver ROS, GERD  Controlled, IBS    Endo/Other   Obesity   Renal/GU negative Renal ROS     Musculoskeletal  (+) Arthritis ,   Abdominal   Peds  Hematology negative hematology ROS (+)   Anesthesia Other Findings   Reproductive/Obstetrics                            Anesthesia Physical Anesthesia Plan  ASA: 2  Anesthesia Plan: Spinal   Post-op Pain Management:    Induction:   PONV Risk Score and Plan: 3 and Treatment may vary due to age or medical condition and Propofol infusion  Airway Management Planned: Natural Airway and Simple Face Mask  Additional Equipment: None  Intra-op Plan:   Post-operative Plan:   Informed Consent: I have reviewed the patients History and Physical, chart, labs and discussed the procedure including the risks, benefits and alternatives for the proposed anesthesia with the patient or authorized representative who has indicated his/her understanding and acceptance.       Plan Discussed with: CRNA and Anesthesiologist  Anesthesia Plan Comments: (Labs reviewed, platelets acceptable. Discussed risks and benefits of spinal, including spinal/epidural hematoma, infection, failed block, and PDPH. Patient expressed  understanding and wished to proceed. )       Anesthesia Quick Evaluation

## 2021-11-04 NOTE — Anesthesia Procedure Notes (Signed)
Spinal  Patient location during procedure: OR Start time: 11/04/2021 9:39 AM Reason for block: surgical anesthesia Staffing Performed: resident/CRNA  Anesthesiologist: Audry Pili, MD Resident/CRNA: Eben Burow, CRNA Preanesthetic Checklist Completed: patient identified, IV checked, site marked, risks and benefits discussed, surgical consent, monitors and equipment checked, pre-op evaluation and timeout performed Spinal Block Patient position: sitting Prep: DuraPrep and site prepped and draped Patient monitoring: continuous pulse ox, blood pressure and heart rate Approach: midline Location: L3-4 Injection technique: single-shot Needle Needle type: Pencan  Needle gauge: 24 G Needle length: 10 cm Assessment Sensory level: T6 Events: CSF return Additional Notes Pt placed in sitting position, spinal kit expiration date checked and verified, + CSF, - heme, pt tolerated well. Dr Fransisco Beau present and supervising throughout.

## 2021-11-05 DIAGNOSIS — Z96649 Presence of unspecified artificial hip joint: Secondary | ICD-10-CM | POA: Diagnosis not present

## 2021-11-05 DIAGNOSIS — M1611 Unilateral primary osteoarthritis, right hip: Secondary | ICD-10-CM | POA: Diagnosis not present

## 2021-11-05 DIAGNOSIS — I10 Essential (primary) hypertension: Secondary | ICD-10-CM | POA: Diagnosis not present

## 2021-11-05 DIAGNOSIS — Z859 Personal history of malignant neoplasm, unspecified: Secondary | ICD-10-CM | POA: Diagnosis not present

## 2021-11-05 DIAGNOSIS — Z79899 Other long term (current) drug therapy: Secondary | ICD-10-CM | POA: Diagnosis not present

## 2021-11-05 LAB — BASIC METABOLIC PANEL
Anion gap: 6 (ref 5–15)
BUN: 15 mg/dL (ref 8–23)
CO2: 23 mmol/L (ref 22–32)
Calcium: 8.1 mg/dL — ABNORMAL LOW (ref 8.9–10.3)
Chloride: 107 mmol/L (ref 98–111)
Creatinine, Ser: 0.66 mg/dL (ref 0.44–1.00)
GFR, Estimated: >60 mL/min (ref >60)
Glucose, Bld: 133 mg/dL — ABNORMAL HIGH (ref 70–99)
Potassium: 3.4 mmol/L — ABNORMAL LOW (ref 3.5–5.1)
Sodium: 136 mmol/L (ref 135–145)

## 2021-11-05 LAB — CBC
HCT: 32.6 % — ABNORMAL LOW (ref 36.0–46.0)
Hemoglobin: 10.8 g/dL — ABNORMAL LOW (ref 12.0–15.0)
MCH: 30.1 pg (ref 26.0–34.0)
MCHC: 33.1 g/dL (ref 30.0–36.0)
MCV: 90.8 fL (ref 80.0–100.0)
Platelets: 146 10*3/uL — ABNORMAL LOW (ref 150–400)
RBC: 3.59 MIL/uL — ABNORMAL LOW (ref 3.87–5.11)
RDW: 13.2 % (ref 11.5–15.5)
WBC: 10.9 10*3/uL — ABNORMAL HIGH (ref 4.0–10.5)
nRBC: 0 % (ref 0.0–0.2)

## 2021-11-05 MED ORDER — HYDROCODONE-ACETAMINOPHEN 5-325 MG PO TABS: 1.0000 | ORAL_TABLET | Freq: Four times a day (QID) | ORAL | 0 refills | Status: DC | PRN

## 2021-11-05 MED ORDER — ASPIRIN 81 MG PO CHEW: 81.0000 mg | CHEWABLE_TABLET | Freq: Two times a day (BID) | ORAL | 0 refills | Status: DC

## 2021-11-05 MED ORDER — METHOCARBAMOL 500 MG PO TABS
500.0000 mg | ORAL_TABLET | Freq: Four times a day (QID) | ORAL | 1 refills | Status: DC | PRN
Start: 2021-11-05 — End: 2021-11-21

## 2021-11-05 NOTE — Progress Notes (Signed)
Physical Therapy Treatment Patient Details Name: Gloria Werner MRN: [id number] DOB: 08/23/1941 Today's Date: 11/05/2021   History of Present Illness Pt is 79 yo female s/p R anterior THA on 11/04/21.  She has hx including but not limited to arthritis, DDD, OSA, GERD, HTN, osteopenia,    PT Comments    Pt ambulated in hallway and performed LE exercises.  Pt reports she has a ramp to enter home. Pt feels ready for d/c home today.    Recommendations for follow up therapy are one component of a multi-disciplinary discharge planning process, led by the attending physician.  Recommendations may be updated based on patient status, additional functional criteria and insurance authorization.  Follow Up Recommendations  Follow physician's recommendations for discharge plan and follow up therapies     Assistance Recommended at Discharge Intermittent Supervision/Assistance  Equipment Recommendations  Other (comment) (youth RW)    Recommendations for Other Services       Precautions / Restrictions Precautions Precautions: Fall Restrictions Weight Bearing Restrictions: Yes RLE Weight Bearing: Weight bearing as tolerated     Mobility  Bed Mobility Overal bed mobility: Needs Assistance Bed Mobility: Supine to Sit     Supine to sit: Supervision;HOB elevated          Transfers Overall transfer level: Needs assistance Equipment used: Rolling walker (2 wheels) Transfers: Sit to/from Stand Sit to Stand: Min guard           General transfer comment: verbal cues for hand placement    Ambulation/Gait Ambulation/Gait assistance: Min guard Gait Distance (Feet): 200 Feet Assistive device: Rolling walker (2 wheels) Gait Pattern/deviations: Step-through pattern;Decreased stride length;Antalgic;Decreased stance time - right Gait velocity: decreased     General Gait Details: verbal cue for RW positioning   Stairs             Wheelchair Mobility    Modified  Rankin (Stroke Patients Only)       Balance                                            Cognition Arousal/Alertness: Awake/alert Behavior During Therapy: WFL for tasks assessed/performed Overall Cognitive Status: Within Functional Limits for tasks assessed                                          Exercises Total Joint Exercises Ankle Circles/Pumps: AROM;Both;10 reps Quad Sets: AROM;Both;10 reps Heel Slides: AAROM;Right;10 reps Hip ABduction/ADduction: AROM;Right;10 reps;Standing;Supine Long Arc Quad: AROM;Right;Seated;10 reps Knee Flexion: AROM;Right;Standing;10 reps Marching in Standing: AROM;Right;Standing;10 reps    General Comments        Pertinent Vitals/Pain Pain Assessment: 0-10 Pain Score: 3  Pain Location: right hip Pain Descriptors / Indicators: Sore Pain Intervention(s): Repositioned;Monitored during session;Premedicated before session    Home Living                          Prior Function            PT Goals (current goals can now be found in the care plan section) Progress towards PT goals: Progressing toward goals    Frequency    7X/week      PT Plan Current plan remains appropriate    Co-evaluation  AM-PAC PT "6 Clicks" Mobility   Outcome Measure  Help needed turning from your back to your side while in a flat bed without using bedrails?: A Little Help needed moving from lying on your back to sitting on the side of a flat bed without using bedrails?: A Little Help needed moving to and from a bed to a chair (including a wheelchair)?: A Little Help needed standing up from a chair using your arms (e.g., wheelchair or bedside chair)?: A Little Help needed to walk in hospital room?: A Little Help needed climbing 3-5 steps with a railing? : A Little 6 Click Score: 18    End of Session Equipment Utilized During Treatment: Gait belt Activity Tolerance: Patient tolerated  treatment well Patient left: with call bell/phone within reach;in chair;with family/visitor present Nurse Communication: Mobility status PT Visit Diagnosis: Other abnormalities of gait and mobility (R26.89);Muscle weakness (generalized) (M62.81)     Time: 9278-0044 PT Time Calculation (min) (ACUTE ONLY): 17 min  Charges:  $Gait Training: 8-22 mins                    Jannette Spanner PT, DPT Acute Rehabilitation Services Pager: [phone] Office: [place] 11/05/2021, 12:49 PM

## 2021-11-05 NOTE — Discharge Summary (Signed)
Patient ID: Gloria Werner MRN: [id number] DOB/AGE: July 14, 1941 80 y.o.  Admit date: 11/04/2021 Discharge date: 11/05/2021  Admission Diagnoses:  Principal Problem:   Unilateral primary osteoarthritis, right hip Active Problems:   Status post total replacement of right hip   Discharge Diagnoses:  Same  Past Medical History:  Diagnosis Date   Allergic rhinitis due to pollen    Cancer [facility])    Cataract    NS OU   Complication of anesthesia    Cough    Gastroesophageal reflux disease without esophagitis    Heart murmur    Hypertension    Irritable bowel syndrome    Lichen simplex chronicus 09/2004   PONV (postoperative nausea and vomiting)     Surgeries: Procedure(s): RIGHT TOTAL HIP ARTHROPLASTY ANTERIOR APPROACH on 11/04/2021   Consultants:   Discharged Condition: Improved  Hospital Course: Gloria Werner is an 80 y.o. female who was admitted 11/04/2021 for operative treatment ofUnilateral primary osteoarthritis, right hip. Patient has severe unremitting pain that affects sleep, daily activities, and work/hobbies. After pre-op clearance the patient was taken to the operating room on 11/04/2021 and underwent  Procedure(s): RIGHT TOTAL HIP ARTHROPLASTY ANTERIOR APPROACH.    Patient was given perioperative antibiotics:  Anti-infectives (From admission, onward)    Start     Dose/Rate Route Frequency Ordered Stop   11/04/21 1600  ceFAZolin (ANCEF) IVPB 1 g/50 mL premix        1 g 100 mL/hr over 30 Minutes Intravenous Every 6 hours 11/04/21 1246 11/04/21 2226   11/04/21 0730  ceFAZolin (ANCEF) IVPB 2g/100 mL premix        2 g 200 mL/hr over 30 Minutes Intravenous On call to O.R. 11/04/21 0725 11/04/21 0950        Patient was given sequential compression devices, early ambulation, and chemoprophylaxis to prevent DVT.  Patient benefited maximally from hospital stay and there were no complications.    Recent vital signs: Patient Vitals for the past 24  hrs:  BP Temp Temp src Pulse Resp SpO2 Height Weight  11/05/21 1003 110/60 98.8 F (37.1 C) Oral 80 16 91 % -- --  11/05/21 0629 129/64 98.6 F (37 C) -- 79 17 97 % -- --  11/05/21 0223 117/67 98.4 F (36.9 C) -- 78 17 97 % -- --  11/04/21 2024 125/70 99.5 F (37.5 C) Oral 78 20 94 % -- --  11/04/21 1847 123/69 99 F (37.2 C) Oral 86 (!) 22 96 % -- --  11/04/21 1436 139/74 98.2 F (36.8 C) Oral 81 20 98 % -- --  11/04/21 1313 -- -- -- -- -- -- 4' 11.5" (1.511 m) 69 kg  11/04/21 1249 (!) 147/68 98 F (36.7 C) Oral 71 16 100 % -- --  11/04/21 1215 129/75 97.9 F (36.6 C) -- 74 13 100 % -- --  11/04/21 1200 126/67 -- -- 65 17 99 % -- --  11/04/21 1145 124/67 -- -- 62 18 98 % -- --  11/04/21 1130 127/69 -- -- 61 19 97 % -- --     Recent laboratory studies: No results for input(s): WBC, HGB, HCT, PLT, NA, K, CL, CO2, BUN, CREATININE, GLUCOSE, INR, CALCIUM in the last 72 hours.  Invalid input(s): PT, 2   Discharge Medications:   Allergies as of 11/05/2021       Reactions   Lexapro [escitalopram] Diarrhea, Other (See Comments)   headache   Ace Inhibitors Cough   Codeine Nausea And Vomiting  Medication List     TAKE these medications    acetaminophen 500 MG tablet Commonly known as: TYLENOL Take 500-1,000 mg by mouth at bedtime as needed for moderate pain.   amLODipine 5 MG tablet Commonly known as: NORVASC Take 1 tablet (5 mg total) by mouth daily.   aspirin 81 MG chewable tablet Chew 1 tablet (81 mg total) by mouth 2 (two) times daily.   Biotin 10000 MCG Tabs Take 10,000 mcg by mouth daily.   buPROPion 75 MG tablet Commonly known as: Wellbutrin Take 1 tablet (75 mg total) by mouth 2 (two) times daily. What changed: when to take this   calcium carbonate 1500 (600 Ca) MG Tabs tablet Commonly known as: OSCAL Take 600 mg of elemental calcium by mouth daily with breakfast.   dicyclomine 10 MG capsule Commonly known as: Bentyl Take 1 capsule (10 mg  total) by mouth 4 (four) times daily -  before meals and at bedtime. What changed:  when to take this reasons to take this   HYDROcodone-acetaminophen 5-325 MG tablet Commonly known as: NORCO/VICODIN Take 1-2 tablets by mouth every 6 (six) hours as needed for moderate pain (pain score 4-6).   LUBRICATING EYE DROPS OP Place 1 drop into both eyes daily as needed (dry eyes).   meloxicam 7.5 MG tablet Commonly known as: MOBIC Take 1 tablet (7.5 mg total) by mouth daily.   methocarbamol 500 MG tablet Commonly known as: ROBAXIN Take 1 tablet (500 mg total) by mouth every 6 (six) hours as needed for muscle spasms.   polyethylene glycol 17 g packet Commonly known as: MIRALAX / GLYCOLAX Take 17 g by mouth daily. Takes an extra scoop in the evening as needed for moderate-severe constipation.   pyridOXINE 100 MG tablet Commonly known as: VITAMIN B-6 Take 100 mg by mouth daily.   rosuvastatin 5 MG tablet Commonly known as: Crestor Take 1 tablet (5 mg total) by mouth 3 (three) times a week.   saccharomyces boulardii 250 MG capsule Commonly known as: Florastor Take 2 capsules (500 mg total) by mouth 2 (two) times daily. What changed:  how much to take when to take this   sodium chloride 0.65 % Soln nasal spray Commonly known as: OCEAN Place 1 spray into both nostrils 2 (two) times daily as needed for congestion.   Vitamin B-12 500 MCG Lozg Take 1,000 mcg by mouth daily.   vitamin C 500 MG tablet Commonly known as: ASCORBIC ACID Take 500 mg by mouth daily.   Vitamin D (Ergocalciferol) 1.25 MG (50000 UNIT) Caps capsule Commonly known as: DRISDOL TAKE 1 CAPSULE BY MOUTH ONCE EVERY MONTH What changed:  how much to take how to take this when to take this additional instructions               Durable Medical Equipment  (From admission, onward)           Start     Ordered   11/04/21 1247  DME 3 n 1  Once        11/04/21 1246   11/04/21 1247  DME Walker rolling   Once       Question Answer Comment  Walker: With 5 Inch Wheels   Patient needs a walker to treat with the following condition Status post total replacement of right hip      11/04/21 1246            Diagnostic Studies: DG Pelvis Portable  Result Date: 11/04/2021 CLINICAL DATA:  Right hip replacement EXAM: PORTABLE PELVIS 1-2 VIEWS COMPARISON:  None. FINDINGS: Recent postoperative changes of right total hip arthroplasty. The hardware appears aligned and intact on a single view. No acute fracture or dislocation identified. Associated subcutaneous emphysema and overlying surgical staples. IMPRESSION: Total right hip arthroplasty changes. Electronically Signed   By: Ofilia Neas M.D.   On: 11/04/2021 12:57   DG C-Arm 1-60 Min-No Report  Result Date: 11/04/2021 Fluoroscopy was utilized by the requesting physician.  No radiographic interpretation.   DG HIP OPERATIVE UNILAT W OR W/O PELVIS RIGHT  Result Date: 11/04/2021 CLINICAL DATA:  Intraoperative fluoroscopic images for right hip arthroplasty EXAM: OPERATIVE RIGHT HIP (WITH PELVIS IF PERFORMED)  VIEWS TECHNIQUE: Fluoroscopic spot image(s) were submitted for interpretation post-operatively. COMPARISON:  None. FINDINGS: Status post right hip arthroplasty with intact hardware. No perihardware fracture. IMPRESSION: Status post right hip arthroplasty without acute postoperative complications. Electronically Signed   By: Keane Police D.O.   On: 11/04/2021 12:34    Disposition: Discharge disposition: 01-Home or [place]     Mcarthur Rossetti, MD Follow up in 2 week(s).   Specialty: Orthopedic Surgery Contact information: [address] [place] [address] [phone]                  Signed: Mcarthur Rossetti 11/05/2021, 11:23 AM

## 2021-11-05 NOTE — Progress Notes (Signed)
Physical Therapy Treatment Patient Details Name: Gloria Werner MRN: [id number] DOB: [birth year] Today's Date: 11/05/2021   History of Present Illness Pt is 80 yo female s/p R anterior THA on 11/04/21.  She has hx including but not limited to arthritis, DDD, OSA, GERD, HTN, osteopenia,    PT Comments    Pt reports feeling stiff from being in recliner.  Pt's home RW height adjusted and pt ambulated in hallway.  Pt awaiting d/c home today and had no further questions.  Pt plans to f/u with HHPT.   Recommendations for follow up therapy are one component of a multi-disciplinary discharge planning process, led by the attending physician.  Recommendations may be updated based on patient status, additional functional criteria and insurance authorization.  Follow Up Recommendations  Follow physician's recommendations for discharge plan and follow up therapies     Assistance Recommended at Discharge Intermittent Supervision/Assistance  Equipment Recommendations  Other (comment) (youth RW in room)    Recommendations for Other Services       Precautions / Restrictions Precautions Precautions: Fall Restrictions Weight Bearing Restrictions: No RLE Weight Bearing: Weight bearing as tolerated     Mobility  Bed Mobility Overal bed mobility: Needs Assistance Bed Mobility: Supine to Sit     Supine to sit: Supervision;HOB elevated     General bed mobility comments: pt in recliner    Transfers Overall transfer level: Needs assistance Equipment used: Rolling walker (2 wheels) Transfers: Sit to/from Stand Sit to Stand: Min guard           General transfer comment: verbal cues for hand placement and keeping RW while backing up to chair    Ambulation/Gait Ambulation/Gait assistance: Min guard Gait Distance (Feet): 220 Feet Assistive device: Rolling walker (2 wheels) Gait Pattern/deviations: Step-through pattern;Decreased stride length;Antalgic;Decreased stance time -  right Gait velocity: decreased     General Gait Details: verbal cue for RW positioning, posture (adjusted pt's home RW)   Stairs             Wheelchair Mobility    Modified Rankin (Stroke Patients Only)       Balance                                            Cognition Arousal/Alertness: Awake/alert Behavior During Therapy: WFL for tasks assessed/performed Overall Cognitive Status: Within Functional Limits for tasks assessed                                          Exercises Total Joint Exercises Ankle Circles/Pumps: AROM;Both;10 reps Quad Sets: AROM;Both;10 reps Heel Slides: AAROM;Right;10 reps Hip ABduction/ADduction: AROM;Right;10 reps;Standing;Supine Long Arc Quad: AROM;Right;Seated;10 reps Knee Flexion: AROM;Right;Standing;10 reps Marching in Standing: AROM;Right;Standing;10 reps    General Comments        Pertinent Vitals/Pain Pain Assessment: 0-10 Pain Score: 4  Pain Location: right hip Pain Descriptors / Indicators: Sore Pain Intervention(s): Repositioned;Monitored during session    Home Living                          Prior Function            PT Goals (current goals can now be found in the care plan section) Progress towards PT goals: Progressing toward goals  Frequency    7X/week      PT Plan Current plan remains appropriate    Co-evaluation              AM-PAC PT "6 Clicks" Mobility   Outcome Measure  Help needed turning from your back to your side while in a flat bed without using bedrails?: A Little Help needed moving from lying on your back to sitting on the side of a flat bed without using bedrails?: A Little Help needed moving to and from a bed to a chair (including a wheelchair)?: A Little Help needed standing up from a chair using your arms (e.g., wheelchair or bedside chair)?: A Little Help needed to walk in hospital room?: A Little Help needed climbing 3-5  steps with a railing? : A Little 6 Click Score: 18    End of Session Equipment Utilized During Treatment: Gait belt Activity Tolerance: Patient tolerated treatment well Patient left: in chair;with call bell/phone within reach Nurse Communication: Mobility status PT Visit Diagnosis: Other abnormalities of gait and mobility (R26.89);Muscle weakness (generalized) (M62.81)     Time: 9735-3299 PT Time Calculation (min) (ACUTE ONLY): 8 min  Charges:  $Gait Training: 8-22 mins           Jannette Spanner PT, DPT Acute Rehabilitation Services Pager: [phone] Office: [place] 11/05/2021, 4:20 PM

## 2021-11-05 NOTE — Progress Notes (Signed)
Subjective: 1 Day Post-Op Procedure(s) (LRB): RIGHT TOTAL HIP ARTHROPLASTY ANTERIOR APPROACH (Right) Patient reports pain as moderate.  Motivated and good mobility with therapy.  Objective: Vital signs in last 24 hours: Temp:  [97.9 F (36.6 C)-99.5 F (37.5 C)] 98.8 F (37.1 C) (11/12 1003) Pulse Rate:  [61-86] 80 (11/12 1003) Resp:  [13-22] 16 (11/12 1003) BP: (110-147)/(60-75) 110/60 (11/12 1003) SpO2:  [91 %-100 %] 91 % (11/12 1003) Weight:  [69 kg] 69 kg (11/11 1313)  Intake/Output from previous day: 11/11 0701 - 11/12 0700 In: 4050.6 [P.O.:1440; I.V.:2354.3; IV Piggyback:256.3] Out: 3500 [Urine:3350; Blood:150] Intake/Output this shift: Total I/O In: 240 [P.O.:240] Out: 200 [Urine:200]  No results for input(s): HGB in the last 72 hours. No results for input(s): WBC, RBC, HCT, PLT in the last 72 hours. No results for input(s): NA, K, CL, CO2, BUN, CREATININE, GLUCOSE, CALCIUM in the last 72 hours. No results for input(s): LABPT, INR in the last 72 hours.  Sensation intact distally Intact pulses distally Dorsiflexion/Plantar flexion intact Incision: scant drainage   Assessment/Plan: 1 Day Post-Op Procedure(s) (LRB): RIGHT TOTAL HIP ARTHROPLASTY ANTERIOR APPROACH (Right) Up with therapy Discharge home with home health      Mcarthur Rossetti 11/05/2021, 11:21 AM

## 2021-11-05 NOTE — Progress Notes (Incomplete)
Patient stated she want to hold off of wearing CPAP for tonight. RN placed patient on oxygen for tonight. Patient an+

## 2021-11-05 NOTE — Discharge Instructions (Signed)

## 2021-11-05 NOTE — TOC Transition Note (Signed)
Transition of Care [facility]) - CM/SW Discharge Note   Patient Details  Name: Gloria Werner MRN: [id number] Date of Birth: 07-07-1941  Transition of Care [facility]) CM/SW Contact:  Ross Ludwig, LCSW Phone Number: 11/05/2021, 12:40 PM   Clinical Narrative:     Patient will be going home with home health PT through [place].  CSW signing off please reconsult with any other social work needs, home health agency has been notified of planned discharge.    Final next level of care: [place] Barriers to Discharge: Barriers Resolved   Patient Goals and CMS Choice Patient states their goals for this hospitalization and ongoing recovery are:: To return back home with home health services. CMS Medicare.gov Compare Post Acute Care list provided to:: Patient Choice offered to / list presented to : Patient  Discharge Placement                       Discharge Plan and Services                DME Arranged: Gilford Rile rolling DME Agency: [employer] Date DME Agency Contacted: 11/05/21 Time DME Agency Contacted: 76 Representative spoke with at DME Agency: [facility]on [facility] HH Arranged: PT [place]: [place] Date [place]: 11/05/21 Time [place]: 7 Representative spoke with at [place]: [place] ([employer]) Interventions     Readmission Risk Interventions No flowsheet data found.

## 2021-11-05 NOTE — Plan of Care (Signed)
  Problem: Education: Goal: Knowledge of General Education information will improve Description: Including pain rating scale, medication(s)/side effects and non-pharmacologic comfort measures Outcome: Progressing   Problem: Health Behavior/Discharge Planning: Goal: Ability to manage health-related needs will improve Outcome: Progressing   Problem: Activity: Goal: Risk for activity intolerance will decrease Outcome: Progressing   

## 2021-11-05 NOTE — Progress Notes (Signed)
The patient is alert and oriented and has been seen by her physician. The orders for discharge were written. IV has been removed. Went over discharge instructions with patient and family. She is about to be discharged via wheelchair with all of her belongings.

## 2021-11-06 DIAGNOSIS — M5136 Other intervertebral disc degeneration, lumbar region: Secondary | ICD-10-CM | POA: Diagnosis not present

## 2021-11-06 DIAGNOSIS — K589 Irritable bowel syndrome without diarrhea: Secondary | ICD-10-CM | POA: Diagnosis not present

## 2021-11-06 DIAGNOSIS — I700 Atherosclerosis of aorta: Secondary | ICD-10-CM | POA: Diagnosis not present

## 2021-11-06 DIAGNOSIS — R911 Solitary pulmonary nodule: Secondary | ICD-10-CM | POA: Diagnosis not present

## 2021-11-06 DIAGNOSIS — K219 Gastro-esophageal reflux disease without esophagitis: Secondary | ICD-10-CM | POA: Diagnosis not present

## 2021-11-06 DIAGNOSIS — Z96641 Presence of right artificial hip joint: Secondary | ICD-10-CM | POA: Diagnosis not present

## 2021-11-06 DIAGNOSIS — G4733 Obstructive sleep apnea (adult) (pediatric): Secondary | ICD-10-CM | POA: Diagnosis not present

## 2021-11-06 DIAGNOSIS — E559 Vitamin D deficiency, unspecified: Secondary | ICD-10-CM | POA: Diagnosis not present

## 2021-11-06 DIAGNOSIS — Z79891 Long term (current) use of opiate analgesic: Secondary | ICD-10-CM | POA: Diagnosis not present

## 2021-11-06 DIAGNOSIS — J309 Allergic rhinitis, unspecified: Secondary | ICD-10-CM | POA: Diagnosis not present

## 2021-11-06 DIAGNOSIS — Z7982 Long term (current) use of aspirin: Secondary | ICD-10-CM | POA: Diagnosis not present

## 2021-11-06 DIAGNOSIS — Z471 Aftercare following joint replacement surgery: Secondary | ICD-10-CM | POA: Diagnosis not present

## 2021-11-06 DIAGNOSIS — I10 Essential (primary) hypertension: Secondary | ICD-10-CM | POA: Diagnosis not present

## 2021-11-06 DIAGNOSIS — M8580 Other specified disorders of bone density and structure, unspecified site: Secondary | ICD-10-CM | POA: Diagnosis not present

## 2021-11-06 DIAGNOSIS — L280 Lichen simplex chronicus: Secondary | ICD-10-CM | POA: Diagnosis not present

## 2021-11-06 DIAGNOSIS — F32A Depression, unspecified: Secondary | ICD-10-CM | POA: Diagnosis not present

## 2021-11-08 ENCOUNTER — Encounter (HOSPITAL_COMMUNITY): Payer: Self-pay | Admitting: Orthopaedic Surgery

## 2021-11-08 DIAGNOSIS — E559 Vitamin D deficiency, unspecified: Secondary | ICD-10-CM | POA: Diagnosis not present

## 2021-11-08 DIAGNOSIS — I10 Essential (primary) hypertension: Secondary | ICD-10-CM | POA: Diagnosis not present

## 2021-11-08 DIAGNOSIS — M5136 Other intervertebral disc degeneration, lumbar region: Secondary | ICD-10-CM | POA: Diagnosis not present

## 2021-11-08 DIAGNOSIS — K219 Gastro-esophageal reflux disease without esophagitis: Secondary | ICD-10-CM | POA: Diagnosis not present

## 2021-11-08 DIAGNOSIS — M8580 Other specified disorders of bone density and structure, unspecified site: Secondary | ICD-10-CM | POA: Diagnosis not present

## 2021-11-08 DIAGNOSIS — Z471 Aftercare following joint replacement surgery: Secondary | ICD-10-CM | POA: Diagnosis not present

## 2021-11-08 DIAGNOSIS — G4733 Obstructive sleep apnea (adult) (pediatric): Secondary | ICD-10-CM | POA: Diagnosis not present

## 2021-11-08 DIAGNOSIS — I700 Atherosclerosis of aorta: Secondary | ICD-10-CM | POA: Diagnosis not present

## 2021-11-08 DIAGNOSIS — L280 Lichen simplex chronicus: Secondary | ICD-10-CM | POA: Diagnosis not present

## 2021-11-08 DIAGNOSIS — K589 Irritable bowel syndrome without diarrhea: Secondary | ICD-10-CM | POA: Diagnosis not present

## 2021-11-08 DIAGNOSIS — R911 Solitary pulmonary nodule: Secondary | ICD-10-CM | POA: Diagnosis not present

## 2021-11-08 DIAGNOSIS — Z7982 Long term (current) use of aspirin: Secondary | ICD-10-CM | POA: Diagnosis not present

## 2021-11-08 DIAGNOSIS — Z96641 Presence of right artificial hip joint: Secondary | ICD-10-CM | POA: Diagnosis not present

## 2021-11-08 DIAGNOSIS — Z79891 Long term (current) use of opiate analgesic: Secondary | ICD-10-CM | POA: Diagnosis not present

## 2021-11-08 DIAGNOSIS — J309 Allergic rhinitis, unspecified: Secondary | ICD-10-CM | POA: Diagnosis not present

## 2021-11-08 DIAGNOSIS — F32A Depression, unspecified: Secondary | ICD-10-CM | POA: Diagnosis not present

## 2021-11-10 DIAGNOSIS — Z471 Aftercare following joint replacement surgery: Secondary | ICD-10-CM | POA: Diagnosis not present

## 2021-11-10 DIAGNOSIS — R911 Solitary pulmonary nodule: Secondary | ICD-10-CM | POA: Diagnosis not present

## 2021-11-10 DIAGNOSIS — Z79891 Long term (current) use of opiate analgesic: Secondary | ICD-10-CM | POA: Diagnosis not present

## 2021-11-10 DIAGNOSIS — F32A Depression, unspecified: Secondary | ICD-10-CM | POA: Diagnosis not present

## 2021-11-10 DIAGNOSIS — Z7982 Long term (current) use of aspirin: Secondary | ICD-10-CM | POA: Diagnosis not present

## 2021-11-10 DIAGNOSIS — L280 Lichen simplex chronicus: Secondary | ICD-10-CM | POA: Diagnosis not present

## 2021-11-10 DIAGNOSIS — I700 Atherosclerosis of aorta: Secondary | ICD-10-CM | POA: Diagnosis not present

## 2021-11-10 DIAGNOSIS — K589 Irritable bowel syndrome without diarrhea: Secondary | ICD-10-CM | POA: Diagnosis not present

## 2021-11-10 DIAGNOSIS — Z96641 Presence of right artificial hip joint: Secondary | ICD-10-CM | POA: Diagnosis not present

## 2021-11-10 DIAGNOSIS — K219 Gastro-esophageal reflux disease without esophagitis: Secondary | ICD-10-CM | POA: Diagnosis not present

## 2021-11-10 DIAGNOSIS — M8580 Other specified disorders of bone density and structure, unspecified site: Secondary | ICD-10-CM | POA: Diagnosis not present

## 2021-11-10 DIAGNOSIS — I10 Essential (primary) hypertension: Secondary | ICD-10-CM | POA: Diagnosis not present

## 2021-11-10 DIAGNOSIS — J309 Allergic rhinitis, unspecified: Secondary | ICD-10-CM | POA: Diagnosis not present

## 2021-11-10 DIAGNOSIS — G4733 Obstructive sleep apnea (adult) (pediatric): Secondary | ICD-10-CM | POA: Diagnosis not present

## 2021-11-10 DIAGNOSIS — E559 Vitamin D deficiency, unspecified: Secondary | ICD-10-CM | POA: Diagnosis not present

## 2021-11-10 DIAGNOSIS — M5136 Other intervertebral disc degeneration, lumbar region: Secondary | ICD-10-CM | POA: Diagnosis not present

## 2021-11-14 DIAGNOSIS — I700 Atherosclerosis of aorta: Secondary | ICD-10-CM | POA: Diagnosis not present

## 2021-11-14 DIAGNOSIS — F32A Depression, unspecified: Secondary | ICD-10-CM | POA: Diagnosis not present

## 2021-11-14 DIAGNOSIS — M5136 Other intervertebral disc degeneration, lumbar region: Secondary | ICD-10-CM | POA: Diagnosis not present

## 2021-11-14 DIAGNOSIS — Z471 Aftercare following joint replacement surgery: Secondary | ICD-10-CM | POA: Diagnosis not present

## 2021-11-14 DIAGNOSIS — I10 Essential (primary) hypertension: Secondary | ICD-10-CM | POA: Diagnosis not present

## 2021-11-14 DIAGNOSIS — L280 Lichen simplex chronicus: Secondary | ICD-10-CM | POA: Diagnosis not present

## 2021-11-14 DIAGNOSIS — E559 Vitamin D deficiency, unspecified: Secondary | ICD-10-CM | POA: Diagnosis not present

## 2021-11-14 DIAGNOSIS — Z96641 Presence of right artificial hip joint: Secondary | ICD-10-CM | POA: Diagnosis not present

## 2021-11-14 DIAGNOSIS — R911 Solitary pulmonary nodule: Secondary | ICD-10-CM | POA: Diagnosis not present

## 2021-11-14 DIAGNOSIS — K589 Irritable bowel syndrome without diarrhea: Secondary | ICD-10-CM | POA: Diagnosis not present

## 2021-11-14 DIAGNOSIS — Z79891 Long term (current) use of opiate analgesic: Secondary | ICD-10-CM | POA: Diagnosis not present

## 2021-11-14 DIAGNOSIS — M8580 Other specified disorders of bone density and structure, unspecified site: Secondary | ICD-10-CM | POA: Diagnosis not present

## 2021-11-14 DIAGNOSIS — K219 Gastro-esophageal reflux disease without esophagitis: Secondary | ICD-10-CM | POA: Diagnosis not present

## 2021-11-14 DIAGNOSIS — J309 Allergic rhinitis, unspecified: Secondary | ICD-10-CM | POA: Diagnosis not present

## 2021-11-14 DIAGNOSIS — G4733 Obstructive sleep apnea (adult) (pediatric): Secondary | ICD-10-CM | POA: Diagnosis not present

## 2021-11-14 DIAGNOSIS — Z7982 Long term (current) use of aspirin: Secondary | ICD-10-CM | POA: Diagnosis not present

## 2021-11-15 ENCOUNTER — Other Ambulatory Visit: Payer: Self-pay

## 2021-11-16 ENCOUNTER — Encounter: Payer: Self-pay | Admitting: Family Medicine

## 2021-11-16 ENCOUNTER — Ambulatory Visit (INDEPENDENT_AMBULATORY_CARE_PROVIDER_SITE_OTHER): Payer: Medicare Other | Admitting: Family Medicine

## 2021-11-16 VITALS — BP 138/82 | HR 76 | Temp 96.6°F | Ht 59.0 in | Wt 148.6 lb

## 2021-11-16 DIAGNOSIS — Z8619 Personal history of other infectious and parasitic diseases: Secondary | ICD-10-CM | POA: Insufficient documentation

## 2021-11-16 DIAGNOSIS — I700 Atherosclerosis of aorta: Secondary | ICD-10-CM | POA: Diagnosis not present

## 2021-11-16 DIAGNOSIS — I10 Essential (primary) hypertension: Secondary | ICD-10-CM | POA: Diagnosis not present

## 2021-11-16 DIAGNOSIS — M8588 Other specified disorders of bone density and structure, other site: Secondary | ICD-10-CM

## 2021-11-16 DIAGNOSIS — Z7982 Long term (current) use of aspirin: Secondary | ICD-10-CM | POA: Diagnosis not present

## 2021-11-16 DIAGNOSIS — F32A Depression, unspecified: Secondary | ICD-10-CM

## 2021-11-16 DIAGNOSIS — E559 Vitamin D deficiency, unspecified: Secondary | ICD-10-CM | POA: Diagnosis not present

## 2021-11-16 DIAGNOSIS — R058 Other specified cough: Secondary | ICD-10-CM

## 2021-11-16 DIAGNOSIS — F419 Anxiety disorder, unspecified: Secondary | ICD-10-CM | POA: Diagnosis not present

## 2021-11-16 DIAGNOSIS — E785 Hyperlipidemia, unspecified: Secondary | ICD-10-CM | POA: Insufficient documentation

## 2021-11-16 DIAGNOSIS — Z79891 Long term (current) use of opiate analgesic: Secondary | ICD-10-CM | POA: Diagnosis not present

## 2021-11-16 DIAGNOSIS — J309 Allergic rhinitis, unspecified: Secondary | ICD-10-CM | POA: Diagnosis not present

## 2021-11-16 DIAGNOSIS — K219 Gastro-esophageal reflux disease without esophagitis: Secondary | ICD-10-CM | POA: Diagnosis not present

## 2021-11-16 DIAGNOSIS — Z471 Aftercare following joint replacement surgery: Secondary | ICD-10-CM | POA: Diagnosis not present

## 2021-11-16 DIAGNOSIS — M8580 Other specified disorders of bone density and structure, unspecified site: Secondary | ICD-10-CM | POA: Diagnosis not present

## 2021-11-16 DIAGNOSIS — Z96641 Presence of right artificial hip joint: Secondary | ICD-10-CM | POA: Diagnosis not present

## 2021-11-16 DIAGNOSIS — K589 Irritable bowel syndrome without diarrhea: Secondary | ICD-10-CM | POA: Diagnosis not present

## 2021-11-16 DIAGNOSIS — R911 Solitary pulmonary nodule: Secondary | ICD-10-CM | POA: Diagnosis not present

## 2021-11-16 DIAGNOSIS — L280 Lichen simplex chronicus: Secondary | ICD-10-CM | POA: Diagnosis not present

## 2021-11-16 DIAGNOSIS — G4733 Obstructive sleep apnea (adult) (pediatric): Secondary | ICD-10-CM | POA: Diagnosis not present

## 2021-11-16 DIAGNOSIS — K5790 Diverticulosis of intestine, part unspecified, without perforation or abscess without bleeding: Secondary | ICD-10-CM | POA: Insufficient documentation

## 2021-11-16 DIAGNOSIS — M5136 Other intervertebral disc degeneration, lumbar region: Secondary | ICD-10-CM | POA: Diagnosis not present

## 2021-11-16 MED ORDER — BUPROPION HCL 75 MG PO TABS: 75.0000 mg | ORAL_TABLET | Freq: Every day | ORAL | 3 refills | Status: DC

## 2021-11-16 NOTE — Progress Notes (Signed)
[facility] [address] [place] [address] Dept: [phone] Dept Fax: [phone]  Transfer of Care Office Visit  Subjective:    Patient ID: Gloria Werner, female    DOB: September 30, 1941, 80 y.o..   MRN: [id number]  Chief Complaint  Patient presents with   Establish Care    [facility]-  establish care.     History of Present Illness:  Patient is in today to establish care. Ms. Eutsler was born in [place], [place]. She has been married for 64 years Ilona Sorrel). They have two children: a son (80) in [place], and a daughter (1) locally. They have three granddaughters.  Ms. Willig is retired from working for [employer]. She denies tobacco, alcohol, or drug use.  Ms. Pepitone recently had a right total hip[ joint replacement. She notes that she is doing quite well at this point. She is engaged in physical therapy. She ahs not needed to use pain medication. She does still take a Robaxin at bedtime.  Ms. Alarid has a history of hypertension. She is managed on amlodipine.  Ms. Andress has a history of sleep apnea. She is not currenlty using her CPAP, due to her recent surgery and positioning issues. She does paln to resume this when she can.  Ms. Moxon has a history of episodic anxiety issues. She is managed on Wellbutrin. She notes she mostly takes this once a day.  Ms. Alguire has a history of IBS. She takes Bentyl for periodic issues. She does get constipation associated with this and manages with occasional Miralax use.  Ms. Couillard has a history of osteopenia. She is on a calcium supplement daily and a once a month Vitamin D supplement.  Ms. Overacker has a history of a chronic cough that had been giving her trouble. Initially, it was thought that this might be a side effect of lisinopril, but the cough did not resolve having stopped the medication. She was tried on a trial of a PPI for  possible acid reflux, but this also did not help. She notes that since her hip surgery, the cough has resolved.  Ms. Bruney has a history of abdominal atherosclerosis. Her cardiologist has her taking rosuvastatin to try and prevent further ASCVD.  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/04/2021   DDD (degenerative disc disease), lumbar 09/28/2019   Aortic atherosclerosis ([place]) 09/28/2019   OSA (obstructive sleep apnea) 01/27/2019   Pulmonary nodule 01/27/2019   Allergic rhinitis 11/06/2018   Upper airway cough syndrome 09/24/2018   Essential hypertension 06/03/2018   Seasonal affective disorder ([place]) 01/17/2017   Vitamin D deficiency 01/17/2017   Osteopenia 04/08/2015   Irritable bowel syndrome (IBS) 02/03/2008   Past Surgical History:  Procedure Laterality Date   ABDOMINAL HYSTERECTOMY  1988   secondary to prolapse   BLADDER SUSPENSION     A-P with Hyst   INSERTION OF MESH  10/16/2018   Procedure: INSERTION OF MESH;  Surgeon: Ardis Hughs, MD;  [facility];  Service: Urology;;   NASAL SINUS SURGERY  1970   ROBOTIC ASSISTED LAPAROSCOPIC SACROCOLPOPEXY N/A 10/16/2018   Procedure: XI ROBOTIC ASSISTED LAPAROSCOPIC SACROCOLPOPEXY;  Surgeon: Ardis Hughs, MD;  Location[facility];  Service: Urology;  Laterality: N/A;   TONSILLECTOMY     TOTAL HIP ARTHROPLASTY Right 11/04/2021   Procedure: RIGHT TOTAL HIP ARTHROPLASTY ANTERIOR APPROACH;  Surgeon: Mcarthur Rossetti, MD;  Location[facility];  Service: Orthopedics;  Laterality: Right;   Family History  Problem Relation Age of Onset   Emphysema Mother    Lymphoma Mother    Asthma Mother    Cancer Father 14       lung cancer   Coronary artery disease Brother    Cancer Brother        breast cancer   Breast cancer Brother    Cancer Brother        lung cancer   Diabetes Neg Hx    Hypertension Neg Hx    Colon cancer Neg Hx     Esophageal cancer Neg Hx    Rectal cancer Neg Hx    Stomach cancer Neg Hx    Outpatient Medications Prior to Visit  Medication Sig Dispense Refill   acetaminophen (TYLENOL) 500 MG tablet Take 500-1,000 mg by mouth at bedtime as needed for moderate pain.     amLODipine (NORVASC) 5 MG tablet Take 1 tablet (5 mg total) by mouth daily. 90 tablet 3   aspirin 81 MG chewable tablet Chew 1 tablet (81 mg total) by mouth 2 (two) times daily. 30 tablet 0   Biotin 10000 MCG TABS Take 10,000 mcg by mouth daily.     calcium carbonate (OSCAL) 1500 (600 Ca) MG TABS tablet Take 600 mg of elemental calcium by mouth daily with breakfast.     Carboxymethylcellul-Glycerin (LUBRICATING EYE DROPS OP) Place 1 drop into both eyes daily as needed (dry eyes).     Cyanocobalamin (VITAMIN B-12) 500 MCG LOZG Take 1,000 mcg by mouth daily.     dicyclomine (BENTYL) 10 MG capsule Take 1 capsule (10 mg total) by mouth 4 (four) times daily -  before meals and at bedtime. (Patient taking differently: Take 10 mg by mouth 4 (four) times daily as needed for spasms.) 120 capsule 0   meloxicam (MOBIC) 7.5 MG tablet Take 1 tablet (7.5 mg total) by mouth daily. 90 tablet 1   methocarbamol (ROBAXIN) 500 MG tablet Take 1 tablet (500 mg total) by mouth every 6 (six) hours as needed for muscle spasms. 30 tablet 1   polyethylene glycol (MIRALAX / GLYCOLAX) 17 g packet Take 17 g by mouth daily. Takes an extra scoop in the evening as needed for moderate-severe constipation.     pyridOXINE (VITAMIN B-6) 100 MG tablet Take 100 mg by mouth daily.     rosuvastatin (CRESTOR) 5 MG tablet Take 1 tablet (5 mg total) by mouth 3 (three) times a week. 36 tablet 1   saccharomyces boulardii (FLORASTOR) 250 MG capsule Take 2 capsules (500 mg total) by mouth 2 (two) times daily. (Patient taking differently: Take 250 mg by mouth daily.) 120 capsule 3   sodium chloride (OCEAN) 0.65 % SOLN nasal spray Place 1 spray into both nostrils 2 (two) times daily as needed  for congestion.     vitamin C (ASCORBIC ACID) 500 MG tablet Take 500 mg by mouth daily.     Vitamin D, Ergocalciferol, (DRISDOL) 1.25 MG (50000 UNIT) CAPS capsule TAKE 1 CAPSULE BY MOUTH ONCE EVERY MONTH (Patient taking differently: Take 50,000 Units by mouth every 30 (thirty) days.) 3 capsule 0   buPROPion (WELLBUTRIN) 75 MG tablet Take 1 tablet (75 mg total) by mouth 2 (two) times daily. (Patient taking differently: Take 75 mg by mouth daily.) 180 tablet 3   HYDROcodone-acetaminophen (NORCO/VICODIN) 5-325 MG tablet Take 1-2 tablets by mouth every 6 (six) hours as needed for moderate pain (pain score  4-6). (Patient not taking: Reported on 11/16/2021) 30 tablet 0   No facility-administered medications prior to visit.   Allergies  Allergen Reactions   Lexapro [Escitalopram] Diarrhea and Other (See Comments)    headache   Ace Inhibitors Cough   Codeine Nausea And Vomiting   Objective:   Today's Vitals   11/16/21 1002  BP: 138/82  Pulse: 76  Temp: (!) 96.6 F (35.9 C)  SpO2: 98%  Weight: 148 lb 9.6 oz (67.4 kg)  Height: 4\' 11"  (1.499 m)   Body mass index is 30.01 kg/m.   General: Well developed, well nourished. No acute distress. Psych: Alert and oriented. Normal mood and affect.  Health Maintenance Due  Topic Date Due   COVID-19 Vaccine (3 - Booster for Pfizer series) 04/07/2020   Lab Results Lab Results  Component Value Date   CHOL 162 08/03/2020   HDL 58.00 08/03/2020   LDLCALC 84 08/03/2020   LDLDIRECT 84.0 08/03/2020   TRIG 101.0 08/03/2020   CHOLHDL 3 08/03/2020   CBC Latest Ref Rng & Units 11/05/2021 10/31/2021 08/03/2020  WBC 4.0 - 10.5 K/uL 10.9(H) 5.0 5.5  Hemoglobin 12.0 - 15.0 g/dL 10.8(L) 13.2 13.7  Hematocrit 36.0 - 46.0 % 32.6(L) 40.3 40.9  Platelets 150 - 400 K/uL 146(L) 179 182.0   CMP Latest Ref Rng & Units 11/05/2021 10/31/2021 08/03/2020  Glucose 70 - 99 mg/dL 133(H) 101(H) 92  BUN 8 - 23 mg/dL 15 14 13   Creatinine 0.44 - 1.00 mg/dL 0.66 0.62 0.86   Sodium 135 - 145 mmol/L 136 141 140  Potassium 3.5 - 5.1 mmol/L 3.4(L) 4.0 4.1  Chloride 98 - 111 mmol/L 107 106 104  CO2 22 - 32 mmol/L 23 27 26   Calcium 8.9 - 10.3 mg/dL 8.1(L) 9.2 9.5  Total Protein 6.0 - 8.3 g/dL - - 7.0  Total Bilirubin 0.2 - 1.2 mg/dL - - 0.5  Alkaline Phos 39 - 117 U/L - - 54  AST 0 - 37 U/L - - 21  ALT 0 - 35 U/L - - 16     Imaging: DXA scan (12/21/2020) ASSESSMENT: The BMD measured at Femur Neck Left is 0.713 g/cm2 with a T-score of -2.3. This patient is considered osteopenic according to [facility]) criteria. The scan quality is good. Lumbar spine was not utilized due to exclusion in the past. Site Region Measured Date Measured Age YA BMD Significant CHANGE T-score DualFemur Neck Left 12/21/2020 77.0 -2.3 0.713 g/cm2 * DualFemur Neck Left 04/15/2018 74.3 -1.9 0.777 g/cm2   DualFemur Total Mean 12/21/2020 77.0 -1.5 0.816 g/cm2 * DualFemur Total Mean 04/15/2018 74.3 -1.1 0.866 g/cm2   Left Forearm Radius 33% 12/21/2020 77.0 -2.0 0.711 g/cm2 Left Forearm Radius 33% 04/15/2018 74.3 -1.6 0.745 g/cm2  Assessment & Plan:   1. Anxiety and depression Stable on once daily bupropion. We will continue.  - buPROPion (WELLBUTRIN) 75 MG tablet; Take 1 tablet (75 mg total) by mouth daily.  Dispense: 90 tablet; Refill: 3  2. Essential hypertension Ms. Lascala's blood pressure is mildly high today. This may be due to her recent surgery or to her stopping her CPAP use lately. I will reassess in 3 months and decide if we need to change her therapy. Continue amlodipine for now.  3. Aortic atherosclerosis ([place]) On statin therapy. We will check fasting lipids at her next visit.  4. Osteopenia of lumbar spine Continue calcium and Vitamin D supplementation. I will recheck her Vit D level at her next visit.  5. Upper airway cough syndrome Resolved. Possibly due to nasal allergies. Monitor for now.  Haydee Salter, MD

## 2021-11-18 DIAGNOSIS — J309 Allergic rhinitis, unspecified: Secondary | ICD-10-CM | POA: Diagnosis not present

## 2021-11-18 DIAGNOSIS — L280 Lichen simplex chronicus: Secondary | ICD-10-CM | POA: Diagnosis not present

## 2021-11-18 DIAGNOSIS — K219 Gastro-esophageal reflux disease without esophagitis: Secondary | ICD-10-CM | POA: Diagnosis not present

## 2021-11-18 DIAGNOSIS — Z79891 Long term (current) use of opiate analgesic: Secondary | ICD-10-CM | POA: Diagnosis not present

## 2021-11-18 DIAGNOSIS — Z471 Aftercare following joint replacement surgery: Secondary | ICD-10-CM | POA: Diagnosis not present

## 2021-11-18 DIAGNOSIS — Z96641 Presence of right artificial hip joint: Secondary | ICD-10-CM | POA: Diagnosis not present

## 2021-11-18 DIAGNOSIS — E559 Vitamin D deficiency, unspecified: Secondary | ICD-10-CM | POA: Diagnosis not present

## 2021-11-18 DIAGNOSIS — M8580 Other specified disorders of bone density and structure, unspecified site: Secondary | ICD-10-CM | POA: Diagnosis not present

## 2021-11-18 DIAGNOSIS — G4733 Obstructive sleep apnea (adult) (pediatric): Secondary | ICD-10-CM | POA: Diagnosis not present

## 2021-11-18 DIAGNOSIS — Z7982 Long term (current) use of aspirin: Secondary | ICD-10-CM | POA: Diagnosis not present

## 2021-11-18 DIAGNOSIS — M5136 Other intervertebral disc degeneration, lumbar region: Secondary | ICD-10-CM | POA: Diagnosis not present

## 2021-11-18 DIAGNOSIS — K589 Irritable bowel syndrome without diarrhea: Secondary | ICD-10-CM | POA: Diagnosis not present

## 2021-11-18 DIAGNOSIS — I10 Essential (primary) hypertension: Secondary | ICD-10-CM | POA: Diagnosis not present

## 2021-11-18 DIAGNOSIS — R911 Solitary pulmonary nodule: Secondary | ICD-10-CM | POA: Diagnosis not present

## 2021-11-18 DIAGNOSIS — F32A Depression, unspecified: Secondary | ICD-10-CM | POA: Diagnosis not present

## 2021-11-18 DIAGNOSIS — I700 Atherosclerosis of aorta: Secondary | ICD-10-CM | POA: Diagnosis not present

## 2021-11-21 ENCOUNTER — Other Ambulatory Visit: Payer: Self-pay

## 2021-11-21 ENCOUNTER — Encounter: Payer: Self-pay | Admitting: Orthopaedic Surgery

## 2021-11-21 ENCOUNTER — Ambulatory Visit (INDEPENDENT_AMBULATORY_CARE_PROVIDER_SITE_OTHER): Payer: Medicare Other | Admitting: Orthopaedic Surgery

## 2021-11-21 ENCOUNTER — Other Ambulatory Visit: Payer: Self-pay | Admitting: Family

## 2021-11-21 DIAGNOSIS — Z96641 Presence of right artificial hip joint: Secondary | ICD-10-CM

## 2021-11-21 MED ORDER — METHOCARBAMOL 500 MG PO TABS: 500.0000 mg | ORAL_TABLET | Freq: Four times a day (QID) | ORAL | 1 refills | Status: DC | PRN

## 2021-11-21 NOTE — Progress Notes (Signed)
The patient is 2 weeks status post a right total hip arthroplasty.  She says she is doing well overall.  She is 80 years old and very active.  She is ambulating with a cane but has not been using it much at home.  She has been on a baby aspirin twice a day.  She was not on aspirin before this.  She denies any foot and ankle swelling or calf pain.  Her right hip incision looks good.  The staples were removed and Steri-Strips applied.  There is some swelling but no significant seroma.  Her leg lengths are equal.  She will continue to increase her activities as comfort allows.  She can stop her aspirin since she has been so immobile.  I will refill her methocarbamol.  She can drop from my standpoint when she is comfortable to do so.  All questions and concerns were answered and addressed.  We will see her back in 4 weeks to see how she is doing overall but no x-rays are needed.

## 2021-12-07 ENCOUNTER — Telehealth: Payer: Self-pay

## 2021-12-07 NOTE — Progress Notes (Signed)
Chronic Care Management Pharmacy Assistant   Name: Gloria Werner  MRN: [id number] DOB: 26-Oct-1941  Reason for Encounter:Hypertension Disease State Call.   Recent office visits:  11/16/2021 Dr. Gena Fray MD (PCP) Change bupropion 75 mg to daily  Recent consult visits:  11/21/2021 Dr. Ninfa Linden MD (Orthopedic Surgery) No medication changes noted, return in 4 weeks.  Hospital visits:  Medication Reconciliation was completed by comparing discharge summary, patients EMR and Pharmacy list, and upon discussion with patient.  Admitted to the hospital on 11/04/2021 due to status post total replacement  of right hip. Discharge date was 11/05/2021. Discharged from [facility]?Medications Started at [facility] Discharge:?? -started None  Medication Changes at Hospital Discharge: -Changed None  Medications Discontinued at Hospital Discharge: -Stopped None  Medications that remain the same after Hospital Discharge:??  -All other medications will remain the same.    Medications: Outpatient Encounter Medications as of 12/07/2021  Medication Sig   acetaminophen (TYLENOL) 500 MG tablet Take 500-1,000 mg by mouth at bedtime as needed for moderate pain.   amLODipine (NORVASC) 5 MG tablet Take 1 tablet (5 mg total) by mouth daily.   aspirin 81 MG chewable tablet Chew 1 tablet (81 mg total) by mouth 2 (two) times daily.   Biotin 10000 MCG TABS Take 10,000 mcg by mouth daily.   buPROPion (WELLBUTRIN) 75 MG tablet Take 1 tablet (75 mg total) by mouth daily.   calcium carbonate (OSCAL) 1500 (600 Ca) MG TABS tablet Take 600 mg of elemental calcium by mouth daily with breakfast.   Carboxymethylcellul-Glycerin (LUBRICATING EYE DROPS OP) Place 1 drop into both eyes daily as needed (dry eyes).   Cyanocobalamin (VITAMIN B-12) 500 MCG LOZG Take 1,000 mcg by mouth daily.   dicyclomine (BENTYL) 10 MG capsule Take 1 capsule (10 mg total) by mouth 4 (four) times daily -  before  meals and at bedtime. (Patient taking differently: Take 10 mg by mouth 4 (four) times daily as needed for spasms.)   meloxicam (MOBIC) 7.5 MG tablet Take 1 tablet (7.5 mg total) by mouth daily.   methocarbamol (ROBAXIN) 500 MG tablet Take 1 tablet (500 mg total) by mouth every 6 (six) hours as needed for muscle spasms.   polyethylene glycol (MIRALAX / GLYCOLAX) 17 g packet Take 17 g by mouth daily. Takes an extra scoop in the evening as needed for moderate-severe constipation.   pyridOXINE (VITAMIN B-6) 100 MG tablet Take 100 mg by mouth daily.   rosuvastatin (CRESTOR) 5 MG tablet Take 1 tablet (5 mg total) by mouth 3 (three) times a week.   saccharomyces boulardii (FLORASTOR) 250 MG capsule Take 2 capsules (500 mg total) by mouth 2 (two) times daily. (Patient taking differently: Take 250 mg by mouth daily.)   sodium chloride (OCEAN) 0.65 % SOLN nasal spray Place 1 spray into both nostrils 2 (two) times daily as needed for congestion.   vitamin C (ASCORBIC ACID) 500 MG tablet Take 500 mg by mouth daily.   Vitamin D, Ergocalciferol, (DRISDOL) 1.25 MG (50000 UNIT) CAPS capsule TAKE 1 CAPSULE BY MOUTH ONCE EVERY MONTH (Patient taking differently: Take 50,000 Units by mouth every 30 (thirty) days.)   No facility-administered encounter medications on file as of 12/07/2021.    Care Gaps: Covid-19 Vaccine (Dose 3- Booster for [employer])    Star Rating Drugs: Rosuvastatin 5 mg last filled 01/13/2021 90 day supply at [facility].   Medication Fill Gaps: Bupropion 75 MG last filled  01/14/2021 90 day supply. Dicyclomine 10 MG last filled 05/26/2021 30 day supply.  Reviewed chart prior to disease state call. Spoke with patient regarding BP  Recent Office Vitals: BP Readings from Last 3 Encounters:  11/16/21 138/82  11/05/21 110/60  10/31/21 (!) 157/83   Pulse Readings from Last 3 Encounters:  11/16/21 76  11/05/21 80  10/31/21 78    Wt Readings from Last 3 Encounters:  11/16/21  148 lb 9.6 oz (67.4 kg)  11/04/21 152 lb 1.9 oz (69 kg)  10/31/21 152 lb 4 oz (69.1 kg)     Kidney Function Lab Results  Component Value Date/Time   CREATININE 0.66 11/05/2021 10:54 AM   CREATININE 0.62 10/31/2021 08:25 AM   GFR 64.04 08/03/2020 11:35 AM   GFRNONAA >60 11/05/2021 10:54 AM   GFRAA >60 10/17/2018 05:14 AM    BMP Latest Ref Rng & Units 11/05/2021 10/31/2021 08/03/2020  Glucose 70 - 99 mg/dL 133(H) 101(H) 92  BUN 8 - 23 mg/dL 15 14 13   Creatinine 0.44 - 1.00 mg/dL 0.66 0.62 0.86  Sodium 135 - 145 mmol/L 136 141 140  Potassium 3.5 - 5.1 mmol/L 3.4(L) 4.0 4.1  Chloride 98 - 111 mmol/L 107 106 104  CO2 22 - 32 mmol/L 23 27 26   Calcium 8.9 - 10.3 mg/dL 8.1(L) 9.2 9.5    Current antihypertensive regimen:  Amlodipine 5 mg daily   Patient denies any issue/side effects from Amlodipine.Patient denies any headaches,dizziness or lightheadedness. How often are you checking your Blood Pressure?  Patient states she has not started checking her blood pressure at home, but will soon. Current home BP readings: None What recent interventions/DTPs have been made by any provider to improve Blood Pressure control since last CPP Visit: None ID Any recent hospitalizations or ED visits since last visit with CPP? No What diet changes have been made to improve Blood Pressure Control?  Patient states she is in the process of a healthier diet, but denies any changes in her diet. What exercise is being done to improve your Blood Pressure Control?  Patient reports she is unable  to exercise due to her hip.  Patient denies any questions/concerns for the clinical pharmacist at this time.  Adherence Review: Is the patient currently on ACE/ARB medication? No Does the patient have >5 day gap between last estimated fill dates? No  Telephone follow up appointment with care management team member scheduled for:  04/13/2022 at 8:30 AM  [place] Pharmacist  Assistant [phone]

## 2021-12-20 ENCOUNTER — Ambulatory Visit (INDEPENDENT_AMBULATORY_CARE_PROVIDER_SITE_OTHER): Payer: Medicare Other | Admitting: Orthopaedic Surgery

## 2021-12-20 ENCOUNTER — Encounter: Payer: Self-pay | Admitting: Orthopaedic Surgery

## 2021-12-20 DIAGNOSIS — M7542 Impingement syndrome of left shoulder: Secondary | ICD-10-CM

## 2021-12-20 DIAGNOSIS — Z96641 Presence of right artificial hip joint: Secondary | ICD-10-CM

## 2021-12-20 MED ORDER — METHYLPREDNISOLONE ACETATE 40 MG/ML IJ SUSP
40.0000 mg | INTRAMUSCULAR | Status: AC | PRN
Start: 2021-12-20 — End: 2021-12-20
  Administered 2021-12-20: 11:00:00 40 mg via INTRA_ARTICULAR

## 2021-12-20 MED ORDER — LIDOCAINE HCL 1 % IJ SOLN
3.0000 mL | INTRAMUSCULAR | Status: AC | PRN
Start: 2021-12-20 — End: 2021-12-20
  Administered 2021-12-20: 11:00:00 3 mL

## 2021-12-20 NOTE — Progress Notes (Signed)
Office Visit Note   Patient: Gloria Werner           Date of Birth: 09-19-1941           MRN: [id number] Visit Date: 12/20/2021              Requested by: Haydee Salter, MD [place],  [place] [address] PCP: Haydee Salter, MD   Assessment & Plan: Visit Diagnoses:  1. Status post total replacement of right hip   2. Impingement syndrome of left shoulder     Plan: From a shoulder standpoint, I did provide a steroid injection in her left shoulder subacromial outlet which she tolerated well.  From a hip standpoint, we do not need to see her back for 6 months unless there are issues.  We will have an AP pelvis and lateral of her right operative hip at that visit.  If her shoulder is bothering her before then she knows to come and see [name] sooner.  All questions and concerns were answered and addressed.  Follow-Up Instructions: Return in about 6 months (around 06/20/2022).   Orders:  Orders Placed This Encounter  Procedures   Large Joint Inj   No orders of the defined types were placed in this encounter.     Procedures: Large Joint Inj: L subacromial bursa on 12/20/2021 10:31 AM Indications: pain and diagnostic evaluation Details: 22 G 1.5 in needle  Arthrogram: No  Medications: 3 mL lidocaine 1 %; 40 mg methylPREDNISolone acetate 40 MG/ML Outcome: tolerated well, no immediate complications Procedure, treatment alternatives, risks and benefits explained, specific risks discussed. Consent was given by the patient. Immediately prior to procedure a time out was called to verify the correct patient, procedure, equipment, support staff and site/side marked as required. Patient was prepped and draped in the usual sterile fashion.      Clinical Data: No additional findings.   Subjective: Chief Complaint  Patient presents with   Right Hip - Routine Post Op  The patient is now 6-week status post a right total hip arthroplasty.  She actually is we did  see her for her left shoulder today.  She is doing well with her right hip with some pain in the groin but she is mobilizing well.  She is 80 years old and very active.  She is driving now and walking without assistive device.  Her left shoulder is been hurting recently with overhead activities and reaching behind her.  There is been no known injury of her left shoulder.  She does get pain in the night with the left shoulder.  She denies any weakness with the left shoulder.  HPI  Review of Systems There is currently listed no fever, chills, nausea, vomiting  Objective: Vital Signs: LMP 12/25/1986 (Approximate)   Physical Exam She is alert and orient x3 and in no acute distress Ortho Exam Examination of her right operative hip shows it moves smoothly and fluidly.  Examination of her left shoulder shows good range of motion and no grinding of the glenohumeral joint.  There is positive Neer and Hawkins signs and evidence of impingement but good motion and good strength. Specialty Comments:  No specialty comments available.  Imaging: No results found.   PMFS History: Patient Active Problem List   Diagnosis Date Noted   Diverticulosis 11/16/2021   Dyslipidemia 11/16/2021   History of Clostridium difficile infection 11/16/2021   Status post total replacement of right hip 11/04/2021   DDD (degenerative  disc disease), lumbar 09/28/2019   Aortic atherosclerosis ([place]) 09/28/2019   OSA (obstructive sleep apnea) 01/27/2019   Pulmonary nodule 01/27/2019   Allergic rhinitis 11/06/2018   Upper airway cough syndrome 09/24/2018   Essential hypertension 06/03/2018   Anxiety and depression 01/17/2017   Vitamin D deficiency 01/17/2017   Osteopenia 04/08/2015   Irritable bowel syndrome (IBS) 02/03/2008   Past Medical History:  Diagnosis Date   Allergic rhinitis due to pollen    Cancer [facility])    Cataract    NS OU   Complication of anesthesia    Cough    De Quervain's syndrome (tenosynovitis)  07/03/2018   Gastroesophageal reflux disease without esophagitis    Heart murmur    Hypertension    Irritable bowel syndrome    Lichen simplex chronicus 09/2004   PONV (postoperative nausea and vomiting)    Unilateral primary osteoarthritis, 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    Family History  Problem Relation Age of Onset   Emphysema Mother    Lymphoma Mother    Asthma Mother    Cancer Father 61       lung cancer   Coronary artery disease Brother    Cancer Brother        breast cancer   Breast cancer Brother    Cancer Brother        lung cancer   Diabetes Neg Hx    Hypertension Neg Hx    Colon cancer Neg Hx    Esophageal cancer Neg Hx    Rectal cancer Neg Hx    Stomach cancer Neg Hx     Past Surgical History:  Procedure Laterality Date   ABDOMINAL HYSTERECTOMY  1988   secondary to prolapse   BLADDER SUSPENSION     A-P with Hyst   INSERTION OF MESH  10/16/2018   Procedure: INSERTION OF MESH;  Surgeon: Ardis Hughs, MD;  [facility];  Service: Urology;;   NASAL SINUS SURGERY  1970   ROBOTIC ASSISTED LAPAROSCOPIC SACROCOLPOPEXY N/A 10/16/2018   Procedure: XI ROBOTIC ASSISTED LAPAROSCOPIC SACROCOLPOPEXY;  Surgeon: Ardis Hughs, MD;  [facility];  Service: Urology;  Laterality: N/A;   TONSILLECTOMY     TOTAL HIP ARTHROPLASTY Right 11/04/2021   Procedure: RIGHT TOTAL HIP ARTHROPLASTY ANTERIOR APPROACH;  Surgeon: Mcarthur Rossetti, MD;  [facility];  Service: Orthopedics;  Laterality: Right;   Social History   Occupational History   Occupation: Environmental health practitioner   Occupation: Retired  Tobacco Use   Smoking status: Never   Smokeless tobacco: Never  Scientific laboratory technician Use: Never used  Substance and Sexual Activity   Alcohol use: No   Drug use: No   Sexual activity: Not Currently    Partners: Male    Birth control/protection: Surgical    Comment: hysterectomy

## 2022-01-03 ENCOUNTER — Encounter: Payer: Self-pay | Admitting: Adult Health

## 2022-01-03 ENCOUNTER — Ambulatory Visit: Payer: Medicare Other | Admitting: Adult Health

## 2022-01-03 ENCOUNTER — Other Ambulatory Visit: Payer: Self-pay

## 2022-01-03 VITALS — BP 140/80 | HR 86 | Temp 98.1°F | Ht 59.25 in | Wt 150.2 lb

## 2022-01-03 DIAGNOSIS — R058 Other specified cough: Secondary | ICD-10-CM | POA: Diagnosis not present

## 2022-01-03 DIAGNOSIS — G4733 Obstructive sleep apnea (adult) (pediatric): Secondary | ICD-10-CM

## 2022-01-03 MED ORDER — BENZONATATE 200 MG PO CAPS: 200.0000 mg | ORAL_CAPSULE | Freq: Three times a day (TID) | ORAL | 2 refills | Status: DC | PRN

## 2022-01-03 NOTE — Patient Instructions (Addendum)
Begin Delsym 2 tsp Twice daily  for cough As needed   Begin Tessalon Three times a day  for cough as needed.  Begin Pepcid 20mg  At bedtime   Begin Zyrtec 10mg  At bedtime   Begin Saline nasal Twice daily   Begin Saline nasal gel At bedtime  .  Avoid throat clearing , use sips of water to help with cough and throat clearing  NO MINTS  Refer to Dr. Joya Gaskins at [facility] for chronic cough   Continue on CPAP at night , try to wear all night .  Change CPAP pressure to 5 to 12cm.  Work on healthy weight loss Do not drive if sleepy .   Follow up with Dr. Melvyn Novas  or Reyana Leisey NP in 6 weeks with PFTs.  Please contact office for sooner follow up if symptoms do not improve or worsen or seek emergency care

## 2022-01-03 NOTE — Assessment & Plan Note (Signed)
Ongoing chronic cough x4 years.  Suspect she has an upper airway cough from postnasal drainage and possible reflux.  Will begin cough control regimen with Delsym and Tessalon [place].  Add in Zyrtec for postnasal drainage.  Also add in Pepcid for possible reflux component.  Refer to Dr. Joya Gaskins at the [facility] at [place] PFTs on return.  Plan  Patient Instructions  Begin Delsym 2 tsp Twice daily  for cough As needed   Begin Tessalon Three times a day  for cough as needed.  Begin Pepcid 20mg  At bedtime   Begin Zyrtec 10mg  At bedtime   Begin Saline nasal Twice daily   Begin Saline nasal gel At bedtime  .  Avoid throat clearing , use sips of water to help with cough and throat clearing  NO MINTS  Refer to Dr. Joya Gaskins at [facility] for chronic cough   Continue on CPAP at night , try to wear all night .  Change CPAP pressure to 5 to 12cm.  Work on healthy weight loss Do not drive if sleepy .   Follow up with Dr. Melvyn Novas  or Wofford Stratton NP in 6 weeks with PFTs.  Please contact office for sooner follow up if symptoms do not improve or worsen or seek emergency care

## 2022-01-03 NOTE — Progress Notes (Signed)
Error

## 2022-01-03 NOTE — Progress Notes (Signed)
@Patient  ID: Gloria Werner, female    DOB: 01-14-1941, 81 y.o.   MRN: [id number]  Chief Complaint  Patient presents with   Follow-up    Referring provider: Haydee Salter, MD  HPI: 81 year old female never smoker followed for chronic cough and obstructive sleep apnea  TEST/EVENTS :   Mild obstructive sleep apnea occurred during this study (AHI = 8.3/h). - No significant central sleep apnea occurred during this study (CAI = 1.8/h). - Mild oxygen desaturation was noted during this study (Min O2 = 84.0%).  Spirometry 2020 normal .   01/03/2022 Follow up: Chronic Cough and OSA  Patient returns for a 3 month follow up.  Patient has underlying obstructive sleep apnea.  She is trying to use her CPAP mask each night.  She says over the last 6 weeks she has had difficulty with her mask and staying asleep at night.  Relates it to her recent surgery is trying to use her mask more night.  She says that sometimes the pressures feel a little too high.  CPAP download shows 70% compliance.  Daily average usage around 3-1/2 hours.  Patient is on auto CPAP 5- 15 cm H2O.  Daily average pressure at 8.8 cm H2O.  AHI 4.2/hour.  Patient has a history of chronic cough is been present for greater than 4 years.  She says the cough is productive with thick clear drainage.  She has a lot of postnasal drainage.  Patient says that she recently had hip replacement surgery in late November.  She says she did very well from the surgery.  She also had a significant improvement in her cough right after her surgery.  Did not call for about 5 weeks.  She says over the last couple weeks her cough has started to come back.  She is very interested in getting her cough to completely go away.  She says it was so nice not coughing over there is several weeks.  She has not been using anything for cough or sinus drainage.  She denies any discolored mucus, fever, chest pain, orthopnea.  She has no significant dyspnea.  Previous  simple spirometry in 2020 showed normal lung function. Patient says she is active.  Lives at home.  She drives.   Chest x-ray 2022 showed essentially clear lung with some slightly increased interstitial markings.  Allergies  Allergen Reactions   Lexapro [Escitalopram] Diarrhea and Other (See Comments)    headache   Ace Inhibitors Cough   Codeine Nausea And Vomiting    Immunization History  Administered Date(s) Administered   Fluad Quad(high Dose 65+) 09/16/2019   H1N1 01/07/2009   Influenza Whole 09/23/2012   Influenza, High Dose Seasonal PF 10/11/2017   Influenza,inj,Quad PF,6+ Mos 09/28/2015, 08/02/2016, 09/04/2018   Influenza-Unspecified 08/25/2014, 09/06/2020, 10/14/2021   PFIZER(Purple Top)SARS-COV-2 Vaccination 01/09/2020, 02/11/2020   Pneumococcal Conjugate-13 02/18/2014   Pneumococcal Polysaccharide-23 10/22/2012   Td 10/22/2012   Zoster Recombinat (Shingrix) 04/01/2019   Zoster, Live 11/28/2012    Past Medical History:  Diagnosis Date   Allergic rhinitis due to pollen    Cancer [facility])    Cataract    NS OU   Complication of anesthesia    Cough    De Quervain's syndrome (tenosynovitis) 07/03/2018   Gastroesophageal reflux disease without esophagitis    Heart murmur    Hypertension    Irritable bowel syndrome    Lichen simplex chronicus 09/2004   PONV (postoperative nausea and vomiting)    Unilateral primary osteoarthritis,  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    Tobacco History: Social History   Tobacco Use  Smoking Status Never  Smokeless Tobacco Never   Counseling given: Not Answered   Outpatient Medications Prior to Visit  Medication Sig Dispense Refill   amLODipine (NORVASC) 5 MG tablet Take 1 tablet (5 mg total) by mouth daily. 90 tablet 3   aspirin 81 MG chewable tablet Chew 1 tablet (81 mg total) by mouth 2 (two) times daily. 30 tablet 0   Biotin 10000 MCG TABS Take 10,000 mcg by mouth daily.     buPROPion (WELLBUTRIN) 75 MG tablet Take 1 tablet (75 mg total)  by mouth daily. 90 tablet 3   calcium carbonate (OSCAL) 1500 (600 Ca) MG TABS tablet Take 600 mg of elemental calcium by mouth daily with breakfast.     Carboxymethylcellul-Glycerin (LUBRICATING EYE DROPS OP) Place 1 drop into both eyes daily as needed (dry eyes).     Cyanocobalamin (VITAMIN B-12) 500 MCG LOZG Take 1,000 mcg by mouth daily.     dicyclomine (BENTYL) 10 MG capsule Take 1 capsule (10 mg total) by mouth 4 (four) times daily -  before meals and at bedtime. (Patient taking differently: Take 10 mg by mouth 4 (four) times daily as needed for spasms.) 120 capsule 0   meloxicam (MOBIC) 7.5 MG tablet Take 1 tablet (7.5 mg total) by mouth daily. 90 tablet 1   methocarbamol (ROBAXIN) 500 MG tablet Take 1 tablet (500 mg total) by mouth every 6 (six) hours as needed for muscle spasms. 30 tablet 1   polyethylene glycol (MIRALAX / GLYCOLAX) 17 g packet Take 17 g by mouth daily. Takes an extra scoop in the evening as needed for moderate-severe constipation.     pyridOXINE (VITAMIN B-6) 100 MG tablet Take 100 mg by mouth daily.     rosuvastatin (CRESTOR) 5 MG tablet Take 1 tablet (5 mg total) by mouth 3 (three) times a week. 36 tablet 1   saccharomyces boulardii (FLORASTOR) 250 MG capsule Take 2 capsules (500 mg total) by mouth 2 (two) times daily. (Patient taking differently: Take 250 mg by mouth daily.) 120 capsule 3   sodium chloride (OCEAN) 0.65 % SOLN nasal spray Place 1 spray into both nostrils 2 (two) times daily as needed for congestion.     vitamin C (ASCORBIC ACID) 500 MG tablet Take 500 mg by mouth daily.     Vitamin D, Ergocalciferol, (DRISDOL) 1.25 MG (50000 UNIT) CAPS capsule TAKE 1 CAPSULE BY MOUTH ONCE EVERY MONTH (Patient taking differently: Take 50,000 Units by mouth every 30 (thirty) days.) 3 capsule 0   acetaminophen (TYLENOL) 500 MG tablet Take 500-1,000 mg by mouth at bedtime as needed for moderate pain. (Patient not taking: Reported on 01/03/2022)     No facility-administered  medications prior to visit.     Review of Systems:   Constitutional:   No  weight loss, night sweats,  Fevers, chills, fatigue, or  lassitude.  HEENT:   No headaches,  Difficulty swallowing,  Tooth/dental problems, or  Sore throat,                No sneezing, itching, ear ache,  +nasal congestion, post nasal drip,   CV:  No chest pain,  Orthopnea, PND, swelling in lower extremities, anasarca, dizziness, palpitations, syncope.   GI  No heartburn, indigestion, abdominal pain, nausea, vomiting, diarrhea, change in bowel habits, loss of appetite, bloody stools.   Resp: No shortness of breath with exertion or at rest.  No coughing up of blood.  No change in color of mucus.  No wheezing.  No chest wall deformity  Skin: no rash or lesions.  GU: no dysuria, change in color of urine, no urgency or frequency.  No flank pain, no hematuria   MS:  No joint pain or swelling.  No decreased range of motion.  No back pain.    Physical Exam  BP 140/80 (BP Location: Left Arm, Patient Position: Sitting, Cuff Size: Normal)    Pulse 86    Temp 98.1 F (36.7 C) (Oral)    Ht 4' 11.25" (1.505 m)    Wt 150 lb 3.2 oz (68.1 kg)    LMP 12/25/1986 (Approximate)    SpO2 97%    BMI 30.08 kg/m   GEN: A/Ox3; pleasant , NAD, well nourished    HEENT:  [place]/AT,   NOSE-clear, THROAT-clear, no lesions, no postnasal drip or exudate noted.   NECK:  Supple w/ fair ROM; no JVD; normal carotid impulses w/o bruits; no thyromegaly or nodules palpated; no lymphadenopathy.    RESP  Clear  P & A; w/o, wheezes/ rales/ or rhonchi. no accessory muscle use, no dullness to percussion  CARD:  RRR, no m/r/g, no peripheral edema, pulses intact, no cyanosis or clubbing.  GI:   Soft & nt; nml bowel sounds; no organomegaly or masses detected.   Musco: Warm bil, no deformities or joint swelling noted.   Neuro: alert, no focal deficits noted.    Skin: Warm, no lesions or rashes    Lab Results:  CBC   BMET   BNP No  results found for: BNP    Imaging: No results found.   PFT Results Latest Ref Rng & Units 11/27/2018  FVC-Pre L 3.04  FVC-Predicted Pre % 129  FVC-Post L 3.02  FVC-Predicted Post % 128  Pre FEV1/FVC % % 80  Post FEV1/FCV % % 85  FEV1-Pre L 2.44  FEV1-Predicted Pre % 139  FEV1-Post L 2.56  DLCO uncorrected ml/min/mmHg 20.78  DLCO UNC% % 112  DLCO corrected ml/min/mmHg 20.91  DLCO COR %Predicted % 112  DLVA Predicted % 104  TLC L 5.36  TLC % Predicted % 121  RV % Predicted % 117    Lab Results  Component Value Date   NITRICOXIDE 13 10/01/2018        Assessment & Plan:   OSA (obstructive sleep apnea) Mild obstructive sleep apnea.  Patient is having trouble tolerating CPAP will adjust CPAP pressures for comfort.  Encouraged on CPAP compliance.  Plan  Patient Instructions  Begin Delsym 2 tsp Twice daily  for cough As needed   Begin Tessalon Three times a day  for cough as needed.  Begin Pepcid 20mg  At bedtime   Begin Zyrtec 10mg  At bedtime   Begin Saline nasal Twice daily   Begin Saline nasal gel At bedtime  .  Avoid throat clearing , use sips of water to help with cough and throat clearing  NO MINTS  Refer to Dr. Joya Gaskins at [facility] for chronic cough   Continue on CPAP at night , try to wear all night .  Change CPAP pressure to 5 to 12cm.  Work on healthy weight loss Do not drive if sleepy .   Follow up with Dr. Melvyn Novas  or Andretta Ergle NP in 6 weeks with PFTs.  Please contact office for sooner follow up if symptoms do not improve or worsen or seek emergency care  Upper airway cough syndrome Ongoing chronic cough x4 years.  Suspect she has an upper airway cough from postnasal drainage and possible reflux.  Will begin cough control regimen with Delsym and Tessalon Perles.  Add in Zyrtec for postnasal drainage.  Also add in Pepcid for possible reflux component.  Refer to Dr. Joya Gaskins at the voice center at [place] PFTs on return.  Plan  Patient  Instructions  Begin Delsym 2 tsp Twice daily  for cough As needed   Begin Tessalon Three times a day  for cough as needed.  Begin Pepcid 20mg  At bedtime   Begin Zyrtec 10mg  At bedtime   Begin Saline nasal Twice daily   Begin Saline nasal gel At bedtime  .  Avoid throat clearing , use sips of water to help with cough and throat clearing  NO MINTS  Refer to Dr. Joya [facility]askins at [facility] for chronic cough   Continue on CPAP at night , try to wear all night .  Change CPAP pressure to 5 to 12cm.  Work on healthy weight loss Do not drive if sleepy .   Follow up with Dr. Melvyn Novas  or Berman Grainger NP in 6 weeks with PFTs.  Please contact office for sooner follow up if symptoms do not improve or worsen or seek emergency care          Rexene Edison, NP 01/03/2022

## 2022-01-03 NOTE — Assessment & Plan Note (Signed)
Mild obstructive sleep apnea.  Patient is having trouble tolerating CPAP will adjust CPAP pressures for comfort.  Encouraged on CPAP compliance.  Plan  Patient Instructions  Begin Delsym 2 tsp Twice daily  for cough As needed   Begin Tessalon Three times a day  for cough as needed.  Begin Pepcid 20mg  At bedtime   Begin Zyrtec 10mg  At bedtime   Begin Saline nasal Twice daily   Begin Saline nasal gel At bedtime  .  Avoid throat clearing , use sips of water to help with cough and throat clearing  NO MINTS  Refer to Dr. Joya Gaskins at [place] for chronic cough   Continue on CPAP at night , try to wear all night .  Change CPAP pressure to 5 to 12cm.  Work on healthy weight loss Do not drive if sleepy .   Follow up with Dr. Melvyn Novas  or Gayle Martinez NP in 6 weeks with PFTs.  Please contact office for sooner follow up if symptoms do not improve or worsen or seek emergency care

## 2022-01-12 ENCOUNTER — Telehealth: Payer: Self-pay | Admitting: Orthopaedic Surgery

## 2022-01-12 NOTE — Telephone Encounter (Signed)
Santiago Glad from [facility] called requesting call back from [facility]. Or Autumn H. Asking about pre meds for pt. Will pt be cleared from taking pre meds before dental appt. Please call Santiago Glad at [phone].

## 2022-01-12 NOTE — Telephone Encounter (Signed)
Do you still want her on an antibiotic? Pt told them that she has a hx of C-diff and was advised to be careful of taking antibiotics. Per Dentist office it is just for a filling. Please advise

## 2022-01-12 NOTE — Telephone Encounter (Signed)
Called and advised dental office

## 2022-01-24 DIAGNOSIS — M9904 Segmental and somatic dysfunction of sacral region: Secondary | ICD-10-CM | POA: Diagnosis not present

## 2022-01-24 DIAGNOSIS — M5441 Lumbago with sciatica, right side: Secondary | ICD-10-CM | POA: Diagnosis not present

## 2022-01-24 DIAGNOSIS — M9903 Segmental and somatic dysfunction of lumbar region: Secondary | ICD-10-CM | POA: Diagnosis not present

## 2022-01-24 DIAGNOSIS — M9901 Segmental and somatic dysfunction of cervical region: Secondary | ICD-10-CM | POA: Diagnosis not present

## 2022-01-26 ENCOUNTER — Other Ambulatory Visit: Payer: Self-pay | Admitting: Family Medicine

## 2022-01-26 DIAGNOSIS — M9901 Segmental and somatic dysfunction of cervical region: Secondary | ICD-10-CM | POA: Diagnosis not present

## 2022-01-26 DIAGNOSIS — Z1231 Encounter for screening mammogram for malignant neoplasm of breast: Secondary | ICD-10-CM

## 2022-01-26 DIAGNOSIS — M9904 Segmental and somatic dysfunction of sacral region: Secondary | ICD-10-CM | POA: Diagnosis not present

## 2022-01-26 DIAGNOSIS — M9903 Segmental and somatic dysfunction of lumbar region: Secondary | ICD-10-CM | POA: Diagnosis not present

## 2022-01-26 DIAGNOSIS — M5441 Lumbago with sciatica, right side: Secondary | ICD-10-CM | POA: Diagnosis not present

## 2022-01-27 ENCOUNTER — Other Ambulatory Visit: Payer: Self-pay

## 2022-01-27 DIAGNOSIS — I700 Atherosclerosis of aorta: Secondary | ICD-10-CM

## 2022-01-27 MED ORDER — ROSUVASTATIN CALCIUM 5 MG PO TABS: 5.0000 mg | ORAL_TABLET | ORAL | 1 refills | Status: DC

## 2022-01-31 ENCOUNTER — Telehealth: Payer: Self-pay | Admitting: Nurse Practitioner

## 2022-01-31 DIAGNOSIS — M9903 Segmental and somatic dysfunction of lumbar region: Secondary | ICD-10-CM | POA: Diagnosis not present

## 2022-01-31 DIAGNOSIS — M5441 Lumbago with sciatica, right side: Secondary | ICD-10-CM | POA: Diagnosis not present

## 2022-01-31 DIAGNOSIS — M9901 Segmental and somatic dysfunction of cervical region: Secondary | ICD-10-CM | POA: Diagnosis not present

## 2022-01-31 DIAGNOSIS — M9904 Segmental and somatic dysfunction of sacral region: Secondary | ICD-10-CM | POA: Diagnosis not present

## 2022-01-31 NOTE — Telephone Encounter (Signed)
Patient calls with complaints of several days of abdominal discomfort and feeling constipated. She has been drinking warm prune juice and Miralax for this. She has bowel movements with mucous. "Sometimes my whole abdomen hurts" and "sometimes my stool comes out and I don't know it." She is also complaining of lower abdominal discomfort. Describes this as a tugging/pulling sensation. She does not think she is febrile. No APP openings except virtual.  Please advise.

## 2022-01-31 NOTE — Telephone Encounter (Signed)
Spoke with the patient. She is comfortable with this type of appointment. Scheduled for 02/01/22. Low residue diet and lots of fluids for now.

## 2022-01-31 NOTE — Telephone Encounter (Signed)
Get her a virtual APP appointment

## 2022-01-31 NOTE — Telephone Encounter (Signed)
Inbound call from patient stating she is having a diverticulitis flare-up and is wanting to know if she can get a prescription for it.  Please advise.

## 2022-02-01 ENCOUNTER — Encounter: Payer: Self-pay | Admitting: Gastroenterology

## 2022-02-01 ENCOUNTER — Telehealth: Payer: Self-pay | Admitting: Gastroenterology

## 2022-02-01 ENCOUNTER — Telehealth (INDEPENDENT_AMBULATORY_CARE_PROVIDER_SITE_OTHER): Payer: Medicare Other | Admitting: Gastroenterology

## 2022-02-01 DIAGNOSIS — R1084 Generalized abdominal pain: Secondary | ICD-10-CM | POA: Diagnosis not present

## 2022-02-01 DIAGNOSIS — R197 Diarrhea, unspecified: Secondary | ICD-10-CM | POA: Diagnosis not present

## 2022-02-01 NOTE — Progress Notes (Signed)
02/01/2022 Gloria Werner [id number] May 22, 1941   I connected with Gearlean Alf on 02/01/2022 at 10:00 AM by video enabled telemedicine application and verified that I am speaking with the correct person.  The patient and the provider were at separate locations during the entire encounter.  I discussed the limitations of evaluation and management by telemedicine and the availability of in person appointments.  Patient expressed understanding and agreed to proceed.  The patient and provider were at different locations for the entirety of the visit.  The patient was at home and the provider was at [facility] gastroenterology office.   HISTORY OF PRESENT ILLNESS: This is a pleasant 81 year old female who is a patient of Dr. Blanch Media.  She typically follows here for issues with chronic constipation.  She had tried Linzess previously, but that was too strong for her.  Now only using MiraLAX on a daily basis currently.  Nonetheless, she was added on today to be seen for complaints of generalized abdominal discomfort and change in bowel habits with several loose stools containing mucus over the past couple of days.  She says that this had been going on for a couple of days, started probably over the weekend.  Yesterday she had several bowel movements and was even leaking small amounts of liquid stool/mucus even without passing gas.  She felt some discomfort in her lower abdomen particularly when she did have to have a bowel movement.  She said that yesterday she was really bad.  She ate a bland diet and drank strong probiotic drink.  She also takes a probiotic on a regular basis anyways.  She says that this morning she is feeling much better.  She only had a small bowel movement so far today.  She is not really having any abdominal pain.  She has severe diverticulosis throughout the colon, but has not really had diverticulitis documented on any previous CT scans (most recent studies).  Has a history of  C. difficile.  No recent antibiotic use.  Last colonoscopy was 01/2020 showed severe diverticulosis throughout the colon without evidence of colon polyps.  No further screening colonoscopy is recommended due to age.   Past Medical History:  Diagnosis Date   Allergic rhinitis due to pollen    Cancer [facility])    Cataract    NS OU   Complication of anesthesia    Cough    De Quervain's syndrome (tenosynovitis) 07/03/2018   Gastroesophageal reflux disease without esophagitis    Heart murmur    Hypertension    Irritable bowel syndrome    Lichen simplex chronicus 09/2004   PONV (postoperative nausea and vomiting)    Unilateral primary osteoarthritis, 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   Past Surgical History:  Procedure Laterality Date   ABDOMINAL HYSTERECTOMY  1988   secondary to prolapse   BLADDER SUSPENSION     A-P with Hyst   INSERTION OF MESH  10/16/2018   Procedure: INSERTION OF MESH;  Surgeon: Ardis Hughs, MD;  Location: [facility] ORS;  Service: Urology;;   NASAL SINUS SURGERY  1970   ROBOTIC ASSISTED LAPAROSCOPIC SACROCOLPOPEXY N/A 10/16/2018   Procedure: XI ROBOTIC ASSISTED LAPAROSCOPIC SACROCOLPOPEXY;  Surgeon: Ardis Hughs, MD;  Location: [facility];  Service: Urology;  Laterality: N/A;   TONSILLECTOMY     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 She has never used smokeless tobacco. She reports that she does not drink alcohol and does not use drugs. family history includes Asthma in her mother; Breast cancer in her brother; Cancer in her brother and brother; Cancer (age of onset: 21) in her father; Coronary artery disease in her brother; Emphysema in her mother; Lymphoma in her mother. Allergies  Allergen Reactions   Lexapro [Escitalopram] Diarrhea and Other (See Comments)    headache    Ace Inhibitors Cough   Codeine Nausea And Vomiting      Outpatient Encounter Medications as of 02/01/2022  Medication Sig   acetaminophen (TYLENOL) 500 MG tablet Take 500-1,000 mg by mouth at bedtime as needed for moderate pain. (Patient not taking: Reported on 01/03/2022)   amLODipine (NORVASC) 5 MG tablet Take 1 tablet (5 mg total) by mouth daily.   aspirin 81 MG chewable tablet Chew 1 tablet (81 mg total) by mouth 2 (two) times daily. (Patient not taking: Reported on 02/01/2022)   benzonatate (TESSALON) 200 MG capsule Take 1 capsule (200 mg total) by mouth 3 (three) times daily as needed for cough.   Biotin 10000 MCG TABS Take 10,000 mcg by mouth daily.   buPROPion (WELLBUTRIN) 75 MG tablet Take 1 tablet (75 mg total) by mouth daily. (Patient taking differently: Take 75 mg by mouth daily. Reports taking "as needed".)   calcium carbonate (OSCAL) 1500 (600 Ca) MG TABS tablet Take 600 mg of elemental calcium by mouth daily with breakfast.   Carboxymethylcellul-Glycerin (LUBRICATING EYE DROPS OP) Place 1 drop into both eyes daily as needed (dry eyes).   Cyanocobalamin (VITAMIN B-12) 500 MCG LOZG Take 1,000 mcg by mouth daily.   dicyclomine (BENTYL) 10 MG capsule Take 1 capsule (10 mg total) by mouth 4 (four) times daily -  before meals and at bedtime. (Patient taking differently: Take 10 mg by mouth 4 (four) times daily as needed for spasms.)   meloxicam (MOBIC) 7.5 MG tablet Take 1 tablet (7.5 mg total) by mouth daily. (Patient not taking: Reported on 02/01/2022)   methocarbamol (ROBAXIN) 500 MG tablet Take 1 tablet (500 mg total) by mouth every 6 (six) hours as needed for muscle spasms. (Patient not taking: Reported on 02/01/2022)   polyethylene glycol (MIRALAX / GLYCOLAX) 17 g packet Take 17 g by mouth daily. Takes an extra scoop in the evening as needed for moderate-severe constipation.   pyridOXINE (VITAMIN B-6) 100 MG tablet Take 100 mg by mouth daily.   rosuvastatin (CRESTOR) 5 MG tablet Take 1  tablet (5 mg total) by mouth 3 (three) times a week.   saccharomyces boulardii (FLORASTOR) 250 MG capsule Take 2 capsules (500 mg total) by mouth 2 (two) times daily. (Patient taking differently: Take 250 mg by mouth daily.)   sodium chloride (OCEAN) 0.65 % SOLN nasal spray Place 1 spray into both nostrils 2 (two) times daily as needed for congestion.   vitamin C (ASCORBIC ACID) 500 MG tablet Take 500 mg by mouth daily.   Vitamin D, Ergocalciferol, (DRISDOL) 1.25 MG (50000 UNIT) CAPS capsule TAKE 1 CAPSULE BY MOUTH ONCE EVERY MONTH (Patient taking differently: Take 50,000 Units by mouth every 30 (thirty) days.)   No facility-administered encounter medications on file as of 02/01/2022.    REVIEW OF SYSTEMS  : All other systems reviewed and negative except where noted in the History of Present Illness.   PHYSICAL EXAM: LMP 12/25/1986 (Approximate)  General: Well developed white female in no acute distress,  pleasant, looked well.  ASSESSMENT AND PLAN: *81 year old female with chronic constipation who is being seen for complaints of some generalized abdominal discomfort and a change in bowel habits with frequent stools containing some mucus over the past couple of days.  Symptoms much improved today.  Really no abdominal pain.  Only 1 small bowel movement this morning.  Has history of diverticulosis, but no documented diverticulitis on previous CT scans.  Has history of C. difficile.  I do not want to put her empirically on any antibiotics for that reason.  I think that she since she is feeling better we can continue with observation.  Should continue a bland type diet today until she feels she is back to her baseline.  She takes probiotics daily anyway so she will continue those.  She is agreeable with this.  She will call us back with any return of symptoms and can consider stool studies, CT scan, etc. depending on her symptoms at that time.   CC:  Haydee Salter, MD

## 2022-02-01 NOTE — Patient Instructions (Signed)
Since you are feeling better today we discussed observation for now.  You should continue to eat a bland type diet today until you feel that you are back to your baseline.  If symptoms return please let us know and could consider stool studies, CT scan, etc. depending on exact symptoms,

## 2022-02-01 NOTE — Progress Notes (Signed)
Assessment and plans noted ?

## 2022-02-01 NOTE — Telephone Encounter (Signed)
Ms. cozette, braggs are scheduled for a virtual visit with your provider today.    Just as we do with appointments in the office, we must obtain your consent to participate.  Your consent will be active for this visit and any virtual visit you may have with one of our providers in the next 365 days.    If you have a MyChart account, I can also send a copy of this consent to you electronically.  All virtual visits are billed to your insurance company just like a traditional visit in the office.  As this is a virtual visit, video technology does not allow for your provider to perform a traditional examination.  This may limit your provider's ability to fully assess your condition.  If your provider identifies any concerns that need to be evaluated in person or the need to arrange testing such as labs, EKG, etc, we will make arrangements to do so.    Although advances in technology are sophisticated, we cannot ensure that it will always work on either your end or our end.  If the connection with a video visit is poor, we may have to switch to a telephone visit.  With either a video or telephone visit, we are not always able to ensure that we have a secure connection.   I need to obtain your verbal consent now.   Are you willing to proceed with your visit today?   KALEESI GUYTON has provided verbal consent on 02/01/2022 for a virtual visit (video or telephone).   Laban Emperor. Jiayi Lengacher, PA-C 02/01/2022  10:13 AM

## 2022-02-07 DIAGNOSIS — M5441 Lumbago with sciatica, right side: Secondary | ICD-10-CM | POA: Diagnosis not present

## 2022-02-07 DIAGNOSIS — M9901 Segmental and somatic dysfunction of cervical region: Secondary | ICD-10-CM | POA: Diagnosis not present

## 2022-02-07 DIAGNOSIS — M9903 Segmental and somatic dysfunction of lumbar region: Secondary | ICD-10-CM | POA: Diagnosis not present

## 2022-02-07 DIAGNOSIS — M9904 Segmental and somatic dysfunction of sacral region: Secondary | ICD-10-CM | POA: Diagnosis not present

## 2022-02-09 ENCOUNTER — Telehealth: Payer: Self-pay

## 2022-02-09 NOTE — Progress Notes (Signed)
Chronic Care Management Pharmacy Assistant   Name: Gloria Werner  MRN: [id number] DOB: 1941-05-23  Reason for Encounter: Hypertension Disease State Call.   Recent office visits:  No recent office visit  Recent consult visits:  02/01/2022 Alonza Bogus PA-C (Gastroenterology) No Medication Changes noted 01/03/2022 Carolynn Serve NP (Pulmonary) Start Benzonatate 200 mg 3 times daily PRN, Start Delsym 2 tsp PRN, Start Pepcid 30 mg daily at bedtime, start Zyrtec 10 mg daily at bedtime, Start Saline Nasal twice daily 12/20/2021 Dr. Ninfa Linden MD (Orthopedic) No Medication Changes noted  Hospital visits:  None in previous 6 months  Medications: Outpatient Encounter Medications as of 02/09/2022  Medication Sig   acetaminophen (TYLENOL) 500 MG tablet Take 500-1,000 mg by mouth at bedtime as needed for moderate pain. (Patient not taking: Reported on 01/03/2022)   amLODipine (NORVASC) 5 MG tablet Take 1 tablet (5 mg total) by mouth daily.   aspirin 81 MG chewable tablet Chew 1 tablet (81 mg total) by mouth 2 (two) times daily. (Patient not taking: Reported on 02/01/2022)   benzonatate (TESSALON) 200 MG capsule Take 1 capsule (200 mg total) by mouth 3 (three) times daily as needed for cough.   Biotin 10000 MCG TABS Take 10,000 mcg by mouth daily.   buPROPion (WELLBUTRIN) 75 MG tablet Take 1 tablet (75 mg total) by mouth daily. (Patient taking differently: Take 75 mg by mouth daily. Reports taking "as needed".)   calcium carbonate (OSCAL) 1500 (600 Ca) MG TABS tablet Take 600 mg of elemental calcium by mouth daily with breakfast.   Carboxymethylcellul-Glycerin (LUBRICATING EYE DROPS OP) Place 1 drop into both eyes daily as needed (dry eyes).   Cyanocobalamin (VITAMIN B-12) 500 MCG LOZG Take 1,000 mcg by mouth daily.   dicyclomine (BENTYL) 10 MG capsule Take 1 capsule (10 mg total) by mouth 4 (four) times daily -  before meals and at bedtime. (Patient taking differently: Take 10 mg by mouth 4  (four) times daily as needed for spasms.)   meloxicam (MOBIC) 7.5 MG tablet Take 1 tablet (7.5 mg total) by mouth daily. (Patient not taking: Reported on 02/01/2022)   methocarbamol (ROBAXIN) 500 MG tablet Take 1 tablet (500 mg total) by mouth every 6 (six) hours as needed for muscle spasms. (Patient not taking: Reported on 02/01/2022)   polyethylene glycol (MIRALAX / GLYCOLAX) 17 g packet Take 17 g by mouth daily. Takes an extra scoop in the evening as needed for moderate-severe constipation.   pyridOXINE (VITAMIN B-6) 100 MG tablet Take 100 mg by mouth daily.   rosuvastatin (CRESTOR) 5 MG tablet Take 1 tablet (5 mg total) by mouth 3 (three) times a week.   saccharomyces boulardii (FLORASTOR) 250 MG capsule Take 2 capsules (500 mg total) by mouth 2 (two) times daily. (Patient taking differently: Take 250 mg by mouth daily.)   sodium chloride (OCEAN) 0.65 % SOLN nasal spray Place 1 spray into both nostrils 2 (two) times daily as needed for congestion.   vitamin C (ASCORBIC ACID) 500 MG tablet Take 500 mg by mouth daily.   Vitamin D, Ergocalciferol, (DRISDOL) 1.25 MG (50000 UNIT) CAPS capsule TAKE 1 CAPSULE BY MOUTH ONCE EVERY MONTH (Patient taking differently: Take 50,000 Units by mouth every 30 (thirty) days.)   No facility-administered encounter medications on file as of 02/09/2022.    Care Gaps: Covid-19 Vaccine (Dose 3- Booster for [employer])     Star Rating Drugs: Rosuvastatin 5 mg last filled 01/27/2022 84 day supply at [facility].   Medication Fill Gaps: Dicyclomine 10 MG last filled 05/26/2021 30 day supply.  Reviewed chart prior to disease state call. Spoke with patient regarding BP  Recent Office Vitals: BP Readings from Last 3 Encounters:  01/03/22 140/80  11/16/21 138/82  11/05/21 110/60   Pulse Readings from Last 3 Encounters:  01/03/22 86  11/16/21 76  11/05/21 80    Wt Readings from Last 3 Encounters:  01/03/22 150 lb 3.2 oz (68.1 kg)  11/16/21 148 lb 9.6  oz (67.4 kg)  11/04/21 152 lb 1.9 oz (69 kg)     Kidney Function Lab Results  Component Value Date/Time   CREATININE 0.66 11/05/2021 10:54 AM   CREATININE 0.62 10/31/2021 08:25 AM   GFR 64.04 08/03/2020 11:35 AM   GFRNONAA >60 11/05/2021 10:54 AM   GFRAA >60 10/17/2018 05:14 AM    BMP Latest Ref Rng & Units 11/05/2021 10/31/2021 08/03/2020  Glucose 70 - 99 mg/dL 133(H) 101(H) 92  BUN 8 - 23 mg/dL 15 14 13   Creatinine 0.44 - 1.00 mg/dL 0.66 0.62 0.86  Sodium 135 - 145 mmol/L 136 141 140  Potassium 3.5 - 5.1 mmol/L 3.4(L) 4.0 4.1  Chloride 98 - 111 mmol/L 107 106 104  CO2 22 - 32 mmol/L 23 27 26   Calcium 8.9 - 10.3 mg/dL 8.1(L) 9.2 9.5    Current antihypertensive regimen:  Amlodipine 5 mg daily   Patient denies any issue/side effects from Amlodipine.Patient denies any headaches,dizziness or lightheadedness.  How often are you checking your Blood Pressure? 1-2x per week Current home BP readings:  Patient states her blood pressure was 139/70 on 02/09/2022.  What recent interventions/DTPs have been made by any provider to improve Blood Pressure control since last CPP Visit: None ID   Any recent hospitalizations or ED visits since last visit with CPP? No  What diet changes have been made to improve Blood Pressure Control?  Patient Denies any changes to her diet.   What exercise is being done to improve your Blood Pressure Control?  Patient states she had surgery on her hip, and she is slowly beginning to exercise.  Adherence Review: Is the patient currently on ACE/ARB medication? No Does the patient have >5 day gap between last estimated fill dates? No  Telephone follow up appointment with care management team member scheduled for:  04/13/2022 at 8:30 AM  [place] Pharmacist Assistant [phone]

## 2022-02-10 ENCOUNTER — Ambulatory Visit
Admission: RE | Admit: 2022-02-10 | Discharge: 2022-02-10 | Disposition: A | Discharge status: Home / Self Care | Payer: Medicare Other | Source: Ambulatory Visit | Attending: Family Medicine | Admitting: Family Medicine

## 2022-02-10 DIAGNOSIS — Z1231 Encounter for screening mammogram for malignant neoplasm of breast: Secondary | ICD-10-CM | POA: Diagnosis not present

## 2022-02-14 ENCOUNTER — Encounter: Payer: Self-pay | Admitting: Adult Health

## 2022-02-14 ENCOUNTER — Ambulatory Visit (INDEPENDENT_AMBULATORY_CARE_PROVIDER_SITE_OTHER): Payer: Medicare Other | Admitting: Internal Medicine

## 2022-02-14 ENCOUNTER — Ambulatory Visit: Payer: Medicare Other | Admitting: Adult Health

## 2022-02-14 ENCOUNTER — Other Ambulatory Visit: Payer: Self-pay

## 2022-02-14 VITALS — BP 142/80 | HR 88 | Temp 98.7°F | Ht 59.8 in | Wt 152.0 lb

## 2022-02-14 DIAGNOSIS — G4733 Obstructive sleep apnea (adult) (pediatric): Secondary | ICD-10-CM | POA: Diagnosis not present

## 2022-02-14 DIAGNOSIS — R058 Other specified cough: Secondary | ICD-10-CM | POA: Diagnosis not present

## 2022-02-14 LAB — PULMONARY FUNCTION TEST
DL/VA % pred: 102 %
DL/VA: 4.32 ml/min/mmHg/L
DLCO cor % pred: 122 %
DLCO cor: 20 ml/min/mmHg
DLCO unc % pred: 122 %
DLCO unc: 20 ml/min/mmHg
FEF 25-75 Post: 3.17 L/sec
FEF 25-75 Pre: 2.81 L/sec
FEF2575-%Change-Post: 12 %
FEF2575-%Pred-Post: 249 %
FEF2575-%Pred-Pre: 220 %
FEV1-%Change-Post: 2 %
FEV1-%Pred-Post: 149 %
FEV1-%Pred-Pre: 146 %
FEV1-Post: 2.42 L
FEV1-Pre: 2.37 L
FEV1FVC-%Change-Post: 3 %
FEV1FVC-%Pred-Pre: 111 %
FEV6-%Change-Post: -1 %
FEV6-%Pred-Post: 137 %
FEV6-%Pred-Pre: 139 %
FEV6-Post: 2.84 L
FEV6-Pre: 2.87 L
FEV6FVC-%Pred-Post: 106 %
FEV6FVC-%Pred-Pre: 106 %
FVC-%Change-Post: -1 %
FVC-%Pred-Post: 129 %
FVC-%Pred-Pre: 131 %
FVC-Post: 2.84 L
FVC-Pre: 2.87 L
Post FEV1/FVC ratio: 85 %
Post FEV6/FVC ratio: 100 %
Pre FEV1/FVC ratio: 83 %
Pre FEV6/FVC Ratio: 100 %
RV % pred: 116 %
RV: 2.48 L
TLC % pred: 121 %
TLC: 5.37 L

## 2022-02-14 MED ORDER — ALBUTEROL SULFATE HFA 108 (90 BASE) MCG/ACT IN AERS
1.0000 | INHALATION_SPRAY | Freq: Four times a day (QID) | RESPIRATORY_TRACT | 2 refills | Status: DC | PRN
Start: 2022-02-14 — End: 2024-08-26

## 2022-02-14 NOTE — Assessment & Plan Note (Signed)
Obstructive sleep apnea.  Patient's pressure setting is somewhat more comfortable.  But she is having some increased events.  We will add melatonin at bedtime.  Change pressure setting to 8 to 12 cm H2O. Download on return  Plan  Patient Instructions  Delsym 2 tsp Twice daily  for cough As needed   Tessalon Three times a day  for cough as needed.  Pepcid 20mg  At bedtime   Zyrtec 10mg  At bedtime   Saline nasal Twice daily   Saline nasal gel At bedtime  .  Albuterol inhaler 1-2 puffs every 6hrs as needed for cough /wheezing .  Avoid throat clearing , use sips of water to help with cough and throat clearing  NO MINTS  Follow up with Dr. Joya Gaskins at [facility] for chronic cough as planned  Pneumonia vaccine when ready in next months .   Continue on CPAP at night , try to wear all night .  Change CPAP pressure to 8 to 12cm.  Work on healthy weight loss Do not drive if sleepy .  Try Melatonin At bedtime  for insomnia  Change Wellburtrin dose to am dosing .   Follow up with Dr. Melvyn Novas  or Gearald Stonebraker NP in 3 months and As needed   Please contact office for sooner follow up if symptoms do not improve or worsen or seek emergency care

## 2022-02-14 NOTE — Progress Notes (Signed)
Stood  @Patient  ID: Gloria Werner, female    DOB: August 22, 1941, 81 y.o.   MRN: [id number]  Chief Complaint  Patient presents with   Follow-up    Referring provider: Haydee Salter, MD  HPI: 81 year old female never smoker followed for chronic cough and obstructive sleep apnea  TEST/EVENTS :  Mild obstructive sleep apnea occurred during this study (AHI = 8.3/h). - No significant central sleep apnea occurred during this study (CAI = 1.8/h). - Mild oxygen desaturation was noted during this study (Min O2 = 84.0%).   Spirometry 2020 normal .   02/14/2022 Follow up : Chronic cough and OSA  Patient presents for a 6-week follow-up.  Patient has underlying obstructive sleep apnea.  Last visit patient was having some difficulty with her mask and pressure tolerability.  CPAP pressure was decreased to auto 5 to 12 cm H2O.  Patient says it is more tolerable but she still having some trouble sleeping and has had more sleep apneic events since change in pressure.  CPAP download shows 93% compliance.  Daily average usage at 5 hours.  Patient is on auto 5 to 12 cm H2O.  AHI 5.9.  Daily average pressure at 10 cm H2O.  Patient says she has trouble going to sleep and staying asleep sometimes.  She is not using any sleep aids.  She does take Wellbutrin at bedtime.  Last visit patient was having ongoing chronic cough this been present for more than 4 years.  She was recommended to begin Delsym, Tessalon, Pepcid, Zyrtec for cough control and trigger prevention.  Patient says her cough is substantially improved with about 75% decrease in cough.  Patient was set up for PFTs that were done today.  PFTs show normal lung function with an FEV1 at 149%, ratio 85, FVC 129%, no significant bronchodilator response, DLCO 122% Patient says she still occasionally has some lingering to clean dry cough. Denies any hemoptysis, chest pain, weight loss, edema. She was referred to the voice center at [facility] she has an  upcoming appointment in a couple weeks.   Allergies  Allergen Reactions   Lexapro [Escitalopram] Diarrhea and Other (See Comments)    headache   Ace Inhibitors Cough   Codeine Nausea And Vomiting    Immunization History  Administered Date(s) Administered   Fluad Quad(high Dose 65+) 09/16/2019   H1N1 01/07/2009   Influenza Whole 09/23/2012   Influenza, High Dose Seasonal PF 10/11/2017   Influenza,inj,Quad PF,6+ Mos 09/28/2015, 08/02/2016, 09/04/2018   Influenza-Unspecified 08/25/2014, 09/06/2020, 10/14/2021   PFIZER(Purple Top)SARS-COV-2 Vaccination 01/09/2020, 02/11/2020   Pneumococcal Conjugate-13 02/18/2014   Pneumococcal Polysaccharide-23 10/22/2012   Td 10/22/2012   Zoster Recombinat (Shingrix) 04/01/2019   Zoster, Live 11/28/2012    Past Medical History:  Diagnosis Date   Allergic rhinitis due to pollen    Cancer [facility])    Cataract    NS OU   Complication of anesthesia    Cough    De Quervain's syndrome (tenosynovitis) 07/03/2018   Gastroesophageal reflux disease without esophagitis    Heart murmur    Hypertension    Irritable bowel syndrome    Lichen simplex chronicus 09/2004   PONV (postoperative nausea and vomiting)    Unilateral primary osteoarthritis, 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     amLODipine (NORVASC) 5 MG tablet Take 1 tablet (5 mg total) by mouth daily. 90 tablet 3   aspirin 81 MG chewable tablet Chew 1 tablet (81 mg total) by mouth 2 (two) times daily. 30 tablet 0   benzonatate (TESSALON) 200 MG capsule Take 1 capsule (200 mg total) by mouth 3 (three) times daily as needed for cough. 45 capsule 2   Biotin 10000 MCG TABS Take 10,000 mcg by mouth  daily.     buPROPion (WELLBUTRIN) 75 MG tablet Take 1 tablet (75 mg total) by mouth daily. (Patient taking differently: Take 75 mg by mouth daily. Reports taking "as needed".) 90 tablet 3   calcium carbonate (OSCAL) 1500 (600 Ca) MG TABS tablet Take 600 mg of elemental calcium by mouth daily with breakfast.     Carboxymethylcellul-Glycerin (LUBRICATING EYE DROPS OP) Place 1 drop into both eyes daily as needed (dry eyes).     Cyanocobalamin (VITAMIN B-12) 500 MCG LOZG Take 1,000 mcg by mouth daily.     dicyclomine (BENTYL) 10 MG capsule Take 1 capsule (10 mg total) by mouth 4 (four) times daily -  before meals and at bedtime. (Patient taking differently: Take 10 mg by mouth 4 (four) times daily as needed for spasms.) 120 capsule 0   meloxicam (MOBIC) 7.5 MG tablet Take 1 tablet (7.5 mg total) by mouth daily. 90 tablet 1   methocarbamol (ROBAXIN) 500 MG tablet Take 1 tablet (500 mg total) by mouth every 6 (six) hours as needed for muscle spasms. 30 tablet 1   polyethylene glycol (MIRALAX / GLYCOLAX) 17 g packet Take 17 g by mouth daily. Takes an extra scoop in the evening as needed for moderate-severe constipation.     pyridOXINE (VITAMIN B-6) 100 MG tablet Take 100 mg by mouth daily.     rosuvastatin (CRESTOR) 5 MG tablet Take 1 tablet (5 mg total) by mouth 3 (three) times a week. 36 tablet 1   saccharomyces boulardii (FLORASTOR) 250 MG capsule Take 2 capsules (500 mg total) by mouth 2 (two) times daily. (Patient taking differently: Take 250 mg by mouth daily.) 120 capsule 3   sodium chloride (OCEAN) 0.65 % SOLN nasal spray Place 1 spray into both nostrils 2 (two) times daily as needed for congestion.     vitamin C (ASCORBIC ACID) 500 MG tablet Take 500 mg by mouth daily.     Vitamin D, Ergocalciferol, (DRISDOL) 1.25 MG (50000 UNIT) CAPS capsule TAKE 1 CAPSULE BY MOUTH ONCE EVERY MONTH (Patient taking differently: Take 50,000 Units by mouth every 30 (thirty) days.) 3 capsule 0   No  facility-administered medications prior to visit.     Review of Systems:   Constitutional:   No  weight loss, night sweats,  Fevers, chills, fatigue, or  lassitude.  HEENT:   No headaches,  Difficulty swallowing,  Tooth/dental problems, or  Sore throat,                No sneezing, itching, ear ache,  +nasal congestion, post nasal drip,   CV:  No chest pain,  Orthopnea, PND, swelling in lower extremities, anasarca, dizziness, palpitations, syncope.   GI  No heartburn, indigestion, abdominal pain, nausea, vomiting, diarrhea, change in bowel habits, loss of appetite, bloody stools.   Resp: .  No chest wall deformity  Skin: no rash or lesions.  GU: no dysuria, change in color of urine, no urgency or  frequency.  No flank pain, no hematuria   MS:  No joint pain or swelling.  No decreased range of motion.  No back pain.    Physical Exam  BP (!) 142/80 (BP Location: Left Arm, Patient Position: Sitting, Cuff Size: Normal)    Pulse 88    Temp 98.7 F (37.1 C) (Oral)    Ht 4' 11.8" (1.519 m)    Wt 152 lb (68.9 kg)    LMP 12/25/1986 (Approximate)    SpO2 96%    BMI 29.88 kg/m   GEN: A/Ox3; pleasant , NAD, well nourished    HEENT:  [place]/AT,  NOSE-clear, THROAT-clear, no lesions, no postnasal drip or exudate noted.   NECK:  Supple w/ fair ROM; no JVD; normal carotid impulses w/o bruits; no thyromegaly or nodules palpated; no lymphadenopathy.    RESP  Clear  P & A; w/o, wheezes/ rales/ or rhonchi. no accessory muscle use, no dullness to percussion  CARD:  RRR, no m/r/g, no peripheral edema, pulses intact, no cyanosis or clubbing.  GI:   Soft & nt; nml bowel sounds; no organomegaly or masses detected.   Musco: Warm bil, no deformities or joint swelling noted.   Neuro: alert, no focal deficits noted.    Skin: Warm, no lesions or rashes    Lab Results:    BNP No results found for: BNP     lidocaine (XYLOCAINE) 1 % (with pres) injection 3 mL     Date Action Dose Route User    12/20/2021 1031 Given 3 mL Other (Left Shoulder) Mcarthur Rossetti, MD      methylPREDNISolone acetate (DEPO-MEDROL) injection 40 mg     Date Action Dose Route User   12/20/2021 1031 Given 40 mg Intra-articular (Left Shoulder) Mcarthur Rossetti, MD       PFT Results Latest Ref Rng & Units 02/14/2022 11/27/2018  FVC-Pre L 2.87 3.04  FVC-Predicted Pre % 131 129  FVC-Post L 2.84 3.02  FVC-Predicted Post % 129 128  Pre FEV1/FVC % % 83 80  Post FEV1/FCV % % 85 85  FEV1-Pre L 2.37 2.44  FEV1-Predicted Pre % 146 139  FEV1-Post L 2.42 2.56  DLCO uncorrected ml/min/mmHg 20.00 20.78  DLCO UNC% % 122 112  DLCO corrected ml/min/mmHg 20.00 20.91  DLCO COR %Predicted % 122 112  DLVA Predicted % 102 104  TLC L 5.37 5.36  TLC % Predicted % 121 121  RV % Predicted % 116 117    Lab Results  Component Value Date   NITRICOXIDE 13 10/01/2018        Assessment & Plan:   Upper airway cough syndrome Chronic cough for greater than 4 years.  Pulmonary function testing today is normal.  With no airflow obstruction or restriction.  Does not rule out asthma may have some intermittent asthma.  She may use a albuterol inhaler as needed.  Continue on cough control regimen and trigger prevention.  Follow-up with voice center as recommended  Plan  Patient Instructions  Delsym 2 tsp Twice daily  for cough As needed   Tessalon Three times a day  for cough as needed.  Pepcid 20mg  At bedtime   Zyrtec 10mg  At bedtime   Saline nasal Twice daily   Saline nasal gel At bedtime  .  Albuterol inhaler 1-2 puffs every 6hrs as needed for cough /wheezing .  Avoid throat clearing , use sips of water to help with cough and throat clearing  NO MINTS  Follow up with  Dr. Joya Gaskins at [facility] for chronic cough as planned  Pneumonia vaccine when ready in next months .   Continue on CPAP at night , try to wear all night .  Change CPAP pressure to 8 to 12cm.  Work on healthy weight loss Do not  drive if sleepy .  Try Melatonin At bedtime  for insomnia  Change Wellburtrin dose to am dosing .   Follow up with Dr. Melvyn Novas  or Derel Mcglasson NP in 3 months and As needed   Please contact office for sooner follow up if symptoms do not improve or worsen or seek emergency care        OSA (obstructive sleep apnea) Obstructive sleep apnea.  Patient's pressure setting is somewhat more comfortable.  But she is having some increased events.  We will add melatonin at bedtime.  Change pressure setting to 8 to 12 cm H2O. Download on return  Plan  Patient Instructions  Delsym 2 tsp Twice daily  for cough As needed   Tessalon Three times a day  for cough as needed.  Pepcid 20mg  At bedtime   Zyrtec 10mg  At bedtime   Saline nasal Twice daily   Saline nasal gel At bedtime  .  Albuterol inhaler 1-2 puffs every 6hrs as needed for cough /wheezing .  Avoid throat clearing , use sips of water to help with cough and throat clearing  NO MINTS  Follow up with [facility]. [facility]ins at [facility] for chronic cough as planned  Pneumonia vaccine when ready in next months .   Continue on CPAP at night , try to wear all night .  Change CPAP pressure to 8 to 12cm.  Work on healthy weight loss Do not drive if sleepy .  Try Melatonin At bedtime  for insomnia  Change Wellburtrin dose to am dosing .   Follow up with Dr. Melvyn Novas  or Lauris Keepers NP in 3 months and As needed   Please contact office for sooner follow up if symptoms do not improve or worsen or seek emergency care           Rexene Edison, NP 02/14/2022

## 2022-02-14 NOTE — Patient Instructions (Addendum)
Delsym 2 tsp Twice daily  for cough As needed   Tessalon Three times a day  for cough as needed.  Pepcid 20mg  At bedtime   Zyrtec 10mg  At bedtime   Saline nasal Twice daily   Saline nasal gel At bedtime  .  Albuterol inhaler 1-2 puffs every 6hrs as needed for cough /wheezing .  Avoid throat clearing , use sips of water to help with cough and throat clearing  NO MINTS  Follow up with Dr. Joya Gaskins at [facility] for chronic cough as planned  Pneumonia vaccine when ready in next months .   Continue on CPAP at night , try to wear all night .  Change CPAP pressure to 8 to 12cm.  Work on healthy weight loss Do not drive if sleepy .  Try Melatonin At bedtime  for insomnia  Change Wellburtrin dose to am dosing .   Follow up with Dr. Melvyn Novas  or Voyd Groft NP in 3 months and As needed   Please contact office for sooner follow up if symptoms do not improve or worsen or seek emergency care

## 2022-02-14 NOTE — Progress Notes (Signed)
PFT done today. 

## 2022-02-14 NOTE — Assessment & Plan Note (Signed)
Chronic cough for greater than 4 years.  Pulmonary function testing today is normal.  With no airflow obstruction or restriction.  Does not rule out asthma may have some intermittent asthma.  She may use a albuterol inhaler as needed.  Continue on cough control regimen and trigger prevention.  Follow-up with voice center as recommended  Plan  Patient Instructions  Delsym 2 tsp Twice daily  for cough As needed   Tessalon Three times a day  for cough as needed.  Pepcid 20mg  At bedtime   Zyrtec 10mg  At bedtime   Saline nasal Twice daily   Saline nasal gel At bedtime  .  Albuterol inhaler 1-2 puffs every 6hrs as needed for cough /wheezing .  Avoid throat clearing , use sips of water to help with cough and throat clearing  NO MINTS  Follow up with Dr. Joya Gaskins at [facility] for chronic cough as planned  Pneumonia vaccine when ready in next months .   Continue on CPAP at night , try to wear all night .  Change CPAP pressure to 8 to 12cm.  Work on healthy weight loss Do not drive if sleepy .  Try Melatonin At bedtime  for insomnia  Change Wellburtrin dose to am dosing .   Follow up with Dr. Melvyn Novas  or Deniah Saia NP in 3 months and As needed   Please contact office for sooner follow up if symptoms do not improve or worsen or seek emergency care

## 2022-02-15 DIAGNOSIS — Z961 Presence of intraocular lens: Secondary | ICD-10-CM | POA: Diagnosis not present

## 2022-02-15 DIAGNOSIS — H04123 Dry eye syndrome of bilateral lacrimal glands: Secondary | ICD-10-CM | POA: Diagnosis not present

## 2022-02-15 DIAGNOSIS — H33312 Horseshoe tear of retina without detachment, left eye: Secondary | ICD-10-CM | POA: Diagnosis not present

## 2022-02-15 DIAGNOSIS — H5213 Myopia, bilateral: Secondary | ICD-10-CM | POA: Diagnosis not present

## 2022-02-16 ENCOUNTER — Other Ambulatory Visit: Payer: Self-pay

## 2022-02-17 ENCOUNTER — Other Ambulatory Visit: Payer: Medicare Other

## 2022-02-17 ENCOUNTER — Ambulatory Visit (INDEPENDENT_AMBULATORY_CARE_PROVIDER_SITE_OTHER): Payer: Medicare Other

## 2022-02-17 ENCOUNTER — Ambulatory Visit (INDEPENDENT_AMBULATORY_CARE_PROVIDER_SITE_OTHER): Payer: Medicare Other | Admitting: Family Medicine

## 2022-02-17 VITALS — BP 138/76 | HR 76 | Temp 97.5°F | Ht 59.8 in | Wt 150.8 lb

## 2022-02-17 DIAGNOSIS — R739 Hyperglycemia, unspecified: Secondary | ICD-10-CM | POA: Diagnosis not present

## 2022-02-17 DIAGNOSIS — M25512 Pain in left shoulder: Secondary | ICD-10-CM

## 2022-02-17 DIAGNOSIS — G8929 Other chronic pain: Secondary | ICD-10-CM

## 2022-02-17 DIAGNOSIS — E785 Hyperlipidemia, unspecified: Secondary | ICD-10-CM

## 2022-02-17 DIAGNOSIS — I10 Essential (primary) hypertension: Secondary | ICD-10-CM | POA: Diagnosis not present

## 2022-02-17 DIAGNOSIS — M19012 Primary osteoarthritis, left shoulder: Secondary | ICD-10-CM | POA: Diagnosis not present

## 2022-02-17 DIAGNOSIS — D649 Anemia, unspecified: Secondary | ICD-10-CM | POA: Diagnosis not present

## 2022-02-17 DIAGNOSIS — I700 Atherosclerosis of aorta: Secondary | ICD-10-CM

## 2022-02-17 DIAGNOSIS — E559 Vitamin D deficiency, unspecified: Secondary | ICD-10-CM

## 2022-02-17 DIAGNOSIS — E876 Hypokalemia: Secondary | ICD-10-CM

## 2022-02-17 LAB — COMPREHENSIVE METABOLIC PANEL
ALT: 13 U/L (ref 0–35)
AST: 19 U/L (ref 0–37)
Albumin: 4.4 g/dL (ref 3.5–5.2)
Alkaline Phosphatase: 54 U/L (ref 39–117)
BUN: 15 mg/dL (ref 6–23)
CO2: 32 mEq/L (ref 19–32)
Calcium: 9.3 mg/dL (ref 8.4–10.5)
Chloride: 105 mEq/L (ref 96–112)
Creatinine, Ser: 0.72 mg/dL (ref 0.40–1.20)
GFR: 80.2 mL/min (ref >60.00)
Glucose, Bld: 95 mg/dL (ref 70–99)
Potassium: 4.6 mEq/L (ref 3.5–5.1)
Sodium: 140 mEq/L (ref 135–145)
Total Bilirubin: 0.4 mg/dL (ref 0.2–1.2)
Total Protein: 6.7 g/dL (ref 6.0–8.3)

## 2022-02-17 LAB — LIPID PANEL
Cholesterol: 133 mg/dL (ref 0–200)
HDL: 48.1 mg/dL (ref >39.00)
LDL Cholesterol: 69 mg/dL (ref 0–99)
NonHDL: 85.01
Total CHOL/HDL Ratio: 3
Triglycerides: 79 mg/dL (ref 0.0–149.0)
VLDL: 15.8 mg/dL (ref 0.0–40.0)

## 2022-02-17 LAB — HEMOGLOBIN A1C: Hgb A1c MFr Bld: 5.6 % (ref 4.6–6.5)

## 2022-02-17 LAB — CBC
HCT: 39.8 % (ref 36.0–46.0)
Hemoglobin: 13 g/dL (ref 12.0–15.0)
MCHC: 32.8 g/dL (ref 30.0–36.0)
MCV: 89.3 fl (ref 78.0–100.0)
Platelets: 185 10*3/uL (ref 150.0–400.0)
RBC: 4.45 Mil/uL (ref 3.87–5.11)
RDW: 13.9 % (ref 11.5–15.5)
WBC: 4.4 10*3/uL (ref 4.0–10.5)

## 2022-02-17 LAB — VITAMIN D 25 HYDROXY (VIT D DEFICIENCY, FRACTURES): VITD: 35.18 ng/mL (ref 30.00–100.00)

## 2022-02-17 MED ORDER — NAPROXEN 500 MG PO TABS: 500.0000 mg | ORAL_TABLET | Freq: Two times a day (BID) | ORAL | 0 refills | Status: DC

## 2022-02-17 MED ORDER — VITAMIN D (ERGOCALCIFEROL) 1.25 MG (50000 UNIT) PO CAPS: 50000.0000 [IU] | ORAL_CAPSULE | ORAL | 3 refills | Status: DC

## 2022-02-17 NOTE — Progress Notes (Signed)
[facility] [address] [place] [address] Dept: [phone] Dept Fax: [phone]  Chronic Care Office Visit  Subjective:    Patient ID: Gloria CARLUCCI, female    DOB: [birth year], 81 y.o..   MRN: [id number]  Chief Complaint  Patient presents with   Follow-up    3 month f/u  meds.  Fasting today.  C/o having pain in LT shoulder x 2 months.  No injury.  Has been taking Ibuprofen.      History of Present Illness:  Patient is in today for reassessment of chronic medical issues.  Gloria Werner had a right total hip joint replacement in early Nov. 2022. She notes that she is doing well at this point. At her last follow-up with Dr. Ninfa Linden, she had mentioned to him issues she was having with left shoulder pain starting around mid-December. He gave her a steroid injection into the joint. She did not find that this helped at all. She notes pain in the left shoulder, radiating down into the upper arm, laterally. She finds she has trouble with lifting the arm above shoulder level and reaching behind her back. This is interfering with ADLs. She is also finding it too painful to sleep on that side.  Gloria Werner has a history of hypertension. She is managed on amlodipine.   Gloria Werner has a history of abdominal atherosclerosis and dyslipidemia. Her cardiologist has her taking rosuvastatin to try and prevent further ASCVD.  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   DDD (degenerative disc disease), lumbar 09/28/2019   Aortic atherosclerosis ([place]) 09/28/2019   OSA (obstructive sleep apnea) 01/27/2019   Pulmonary nodule 01/27/2019   Allergic rhinitis 11/06/2018   Upper airway cough syndrome 09/24/2018   Essential  hypertension 06/03/2018   Anxiety and depression 01/17/2017   Vitamin D deficiency 01/17/2017   Osteopenia 04/08/2015   Irritable bowel syndrome (IBS) 02/03/2008   Past Surgical History:  Procedure Laterality Date   ABDOMINAL HYSTERECTOMY  1988   secondary to prolapse   BLADDER SUSPENSION     A-P with Hyst   INSERTION OF MESH  10/16/2018   Procedure: INSERTION OF MESH;  Surgeon: Ardis Hughs, MD;  Location: [facility];  Service: Urology;;   NASAL SINUS SURGERY  1970   ROBOTIC ASSISTED LAPAROSCOPIC SACROCOLPOPEXY N/A 10/16/2018   Procedure: XI ROBOTIC ASSISTED LAPAROSCOPIC SACROCOLPOPEXY;  Surgeon: Ardis Hughs, MD;  Location: [facility];  Service: Urology;  Laterality: N/A;   TONSILLECTOMY     TOTAL HIP ARTHROPLASTY Right 11/04/2021   Procedure: RIGHT TOTAL HIP ARTHROPLASTY ANTERIOR APPROACH;  Surgeon: Mcarthur Rossetti, MD;  Location: [facility];  Service: Orthopedics;  Laterality: Right;   Family History  Problem Relation Age of Onset   Emphysema Mother    Lymphoma Mother    Asthma Mother    Cancer Father 34       lung cancer   Coronary artery disease Brother    Cancer Brother        breast cancer   Breast cancer Brother    Cancer Brother        lung cancer   Diabetes Neg Hx    Hypertension Neg Hx    Colon cancer Neg Hx    Esophageal cancer Neg Hx    Rectal  cancer Neg Hx    Stomach cancer Neg Hx    Outpatient Medications Prior to Visit  Medication Sig Dispense Refill   acetaminophen (TYLENOL) 500 MG tablet Take 500-1,000 mg by mouth at bedtime as needed for moderate pain.     albuterol (VENTOLIN HFA) 108 (90 Base) MCG/ACT inhaler Inhale 1-2 puffs into the lungs every 6 (six) hours as needed. 8 g 2   amLODipine (NORVASC) 5 MG tablet Take 1 tablet (5 mg total) by mouth daily. 90 tablet 3   Biotin 10000 MCG TABS Take 10,000 mcg by mouth daily.     buPROPion (WELLBUTRIN) 75 MG tablet Take 1 tablet (75 mg total) by mouth daily. (Patient taking differently: Take 75  mg by mouth daily. Reports taking "as needed".) 90 tablet 3   calcium carbonate (OSCAL) 1500 (600 Ca) MG TABS tablet Take 600 mg of elemental calcium by mouth daily with breakfast.     Carboxymethylcellul-Glycerin (LUBRICATING EYE DROPS OP) Place 1 drop into both eyes daily as needed (dry eyes).     Cyanocobalamin (VITAMIN B-12) 500 MCG LOZG Take 1,000 mcg by mouth daily.     dicyclomine (BENTYL) 10 MG capsule Take 1 capsule (10 mg total) by mouth 4 (four) times daily -  before meals and at bedtime. (Patient taking differently: Take 10 mg by mouth 4 (four) times daily as needed for spasms.) 120 capsule 0   meloxicam (MOBIC) 7.5 MG tablet Take 1 tablet (7.5 mg total) by mouth daily. 90 tablet 1   polyethylene glycol (MIRALAX / GLYCOLAX) 17 g packet Take 17 g by mouth daily. Takes an extra scoop in the evening as needed for moderate-severe constipation.     pyridOXINE (VITAMIN B-6) 100 MG tablet Take 100 mg by mouth daily.     rosuvastatin (CRESTOR) 5 MG tablet Take 1 tablet (5 mg total) by mouth 3 (three) times a week. 36 tablet 1   saccharomyces boulardii (FLORASTOR) 250 MG capsule Take 2 capsules (500 mg total) by mouth 2 (two) times daily. (Patient taking differently: Take 250 mg by mouth daily.) 120 capsule 3   sodium chloride (OCEAN) 0.65 % SOLN nasal spray Place 1 spray into both nostrils 2 (two) times daily as needed for congestion.     vitamin C (ASCORBIC ACID) 500 MG tablet Take 500 mg by mouth daily.     Vitamin D, Ergocalciferol, (DRISDOL) 1.25 MG (50000 UNIT) CAPS capsule TAKE 1 CAPSULE BY MOUTH ONCE EVERY MONTH (Patient taking differently: Take 50,000 Units by mouth every 30 (thirty) days.) 3 capsule 0   aspirin 81 MG chewable tablet Chew 1 tablet (81 mg total) by mouth 2 (two) times daily. (Patient not taking: Reported on 02/17/2022) 30 tablet 0   benzonatate (TESSALON) 200 MG capsule Take 1 capsule (200 mg total) by mouth 3 (three) times daily as needed for cough. 45 capsule 2    methocarbamol (ROBAXIN) 500 MG tablet Take 1 tablet (500 mg total) by mouth every 6 (six) hours as needed for muscle spasms. 30 tablet 1   No facility-administered medications prior to visit.   Allergies  Allergen Reactions   Lexapro [Escitalopram] Diarrhea and Other (See Comments)    headache   Ace Inhibitors Cough   Codeine Nausea And Vomiting     Objective:   Today's Vitals   02/17/22 0855  BP: 138/76  Pulse: 76  Temp: (!) 97.5 F (36.4 C)  TempSrc: Temporal  SpO2: 98%  Weight: 150 lb 12.8 oz (68.4 kg)  Height:  4' 11.8" (1.519 m)   Body mass index is 29.65 kg/m.   General: Well developed, well nourished. No acute distress. Extremities: Limited ROM of the left shoulder. Pain on palpation over the [facility] joint and the supraspinatus   tendon. Pain with rotator cuff testing, but  no joint swelling or tenderness along the joint line. There is   increased pain with resisted abduction/internal rotation, but no weakness noted. Psych: Alert and oriented. Normal mood and affect.  There are no preventive care reminders to display for this patient.    Imaging: Left shoulder x-ray- There are some arthritic changes of the gleno-humeral joint. There appears to be an old Grade 2 sprain of the [facility] joint.  Assessment & Plan:   1. Vitamin D deficiency We will repeat a Vit. D level today. Gloria Werner is managed on a once monthly dose of Vitamin D, apparently due to previous issues maintaining a normal level.  - VITAMIN D 25 Hydroxy (Vit-D Deficiency, Fractures) - Vitamin D, Ergocalciferol, (DRISDOL) 1.25 MG (50000 UNIT) CAPS capsule; Take 1 capsule (50,000 Units total) by mouth every 30 (thirty) days.  Dispense: 3 capsule; Refill: 3  2. Aortic atherosclerosis ([place]) 3. Dyslipidemia We will reassess lipids today. Plan to continue rosuvastatin.  - Lipid panel  4. Essential hypertension Blood pressure is adequately controlled. Continue amlodipine.  - Comprehensive metabolic  panel  5. Hyperglycemia Prior hypoglycemia. We will screen for possible diaebtes.  - Hemoglobin A1c  6. Anemia, unspecified type Anemia after hip surgery. I will reassess, as patient does note fatigue.  - CBC  7. Hypokalemia Prior hypokalemia after surgery. I will check to make sure this has resolved.  - Comprehensive metabolic panel  8. Chronic left shoulder pain Suspect a mix of shoulder arthritis and rotator cuff tendinitis. She has failed intraarticular steroids. I will proceed with PT referral and a 2-week course of naproxen. She will follow-up if this does not improve her symptoms.  - DG Shoulder Left - naproxen (NAPROSYN) 500 MG tablet; Take 1 tablet (500 mg total) by mouth 2 (two) times daily with a meal.  Dispense: 28 tablet; Refill: 0 - Ambulatory referral to Physical Therapy   Return in about 3 months (around 05/17/2022) for Reassessment.   Haydee Salter, MD

## 2022-02-17 NOTE — Patient Instructions (Signed)
Return for shoulder x-ray later today.

## 2022-02-21 DIAGNOSIS — M9904 Segmental and somatic dysfunction of sacral region: Secondary | ICD-10-CM | POA: Diagnosis not present

## 2022-02-21 DIAGNOSIS — M9901 Segmental and somatic dysfunction of cervical region: Secondary | ICD-10-CM | POA: Diagnosis not present

## 2022-02-21 DIAGNOSIS — M9903 Segmental and somatic dysfunction of lumbar region: Secondary | ICD-10-CM | POA: Diagnosis not present

## 2022-02-21 DIAGNOSIS — M5441 Lumbago with sciatica, right side: Secondary | ICD-10-CM | POA: Diagnosis not present

## 2022-03-02 ENCOUNTER — Other Ambulatory Visit: Payer: Self-pay

## 2022-03-02 ENCOUNTER — Ambulatory Visit: Payer: Medicare Other | Attending: Family Medicine | Admitting: Physical Therapy

## 2022-03-02 ENCOUNTER — Encounter: Payer: Self-pay | Admitting: Physical Therapy

## 2022-03-02 DIAGNOSIS — R600 Localized edema: Secondary | ICD-10-CM

## 2022-03-02 DIAGNOSIS — M25512 Pain in left shoulder: Secondary | ICD-10-CM | POA: Diagnosis not present

## 2022-03-02 DIAGNOSIS — M6281 Muscle weakness (generalized): Secondary | ICD-10-CM | POA: Diagnosis not present

## 2022-03-02 DIAGNOSIS — G8929 Other chronic pain: Secondary | ICD-10-CM | POA: Diagnosis not present

## 2022-03-02 NOTE — Therapy (Signed)
[facility] [address] [place], [place], [address] Phone: [phone]   Fax:  [phone]  Physical Therapy Evaluation  Patient Details  Name: Gloria Werner MRN: [id number] Date of Birth: 1941-01-19 Referring Provider (PT): Dr Herbie Drape   Encounter Date: 03/02/2022   PT End of Session - 03/02/22 1300     Visit Number 1    Date for PT Re-Evaluation 05/25/22    PT Start Time 1102    PT Stop Time 1146    PT Time Calculation (min) 44 min    Activity Tolerance Patient tolerated treatment well;Patient limited by pain    Behavior During Therapy [employer] for tasks assessed/performed             Past Medical History:  Diagnosis Date   Allergic rhinitis due to pollen    Cancer [facility])    Cataract    NS OU   Complication of anesthesia    Cough    De Quervain's syndrome (tenosynovitis) 07/03/2018   Gastroesophageal reflux disease without esophagitis    Heart murmur    Hypertension    Irritable bowel syndrome    Lichen simplex chronicus 09/2004   PONV (postoperative nausea and vomiting)    Unilateral primary osteoarthritis, 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    Past Surgical History:  Procedure Laterality Date   ABDOMINAL HYSTERECTOMY  1988   secondary to prolapse   BLADDER SUSPENSION     A-P with Hyst   INSERTION OF MESH  10/16/2018   Procedure: INSERTION OF MESH;  Surgeon: Crist Fat, MD;  [facility]: [facility];  Service: Urology;;   NASAL SINUS SURGERY  1970   ROBOTIC ASSISTED LAPAROSCOPIC SACROCOLPOPEXY N/A 10/16/2018   Procedure: XI ROBOTIC ASSISTED LAPAROSCOPIC SACROCOLPOPEXY;  Surgeon: Crist Fat, MD;  Location: [facility];  Service: Urology;  Laterality: N/A;   TONSILLECTOMY     TOTAL HIP ARTHROPLASTY Right 11/04/2021   Procedure: RIGHT TOTAL HIP ARTHROPLASTY ANTERIOR APPROACH;  Surgeon: Kathryne Hitch, MD;  Location: [facility];  Service: Orthopedics;  Laterality: Right;    There were no vitals filed for  this visit.    Subjective Assessment - 03/02/22 1103     Subjective Patient reports L shoulder pain. She has been on anti inflammatories for almost 2 weeks iwth little relief, although the achiness at rest seems better.. Shoulder ext is the worst position, elevation bothers it, but not as much as reaching back. She also has some tenderness in her L biceps. Started about 3 months ago. She had THR in November. It started to bother her after that. The Dr gave her an injection at the end of Dec, but it did not help at all.    Pertinent History R THR 11/22    Diagnostic tests X ray shows GH and AC osteoarthritis.    Patient Stated Goals Decrease the pain in her shoulder, be able to dress and wash without pain.    Currently in Pain? Yes    Pain Score 9     Pain Location Shoulder    Pain Orientation Left    Pain Descriptors / Indicators Cramping    Pain Type Chronic pain    Pain Radiating Towards into biceps    Pain Onset More than a month ago    Pain Frequency Intermittent    Aggravating Factors  reaching back-pulling up pants, closing bra, etc.    Pain Relieving Factors No    Effect of Pain on Daily Activities Makes  it very difficult and painful to manage her ADLs.                [facility] PT Assessment - 03/02/22 0001       Assessment   Medical Diagnosis L shoulder pain    Referring Provider (PT) Dr Herbie Drape      Balance Screen   Has the patient fallen in the past 6 months No      Home Environment   Living Environment Private residence    Living Arrangements Spouse/significant other    Type of Home House    Home Access Stairs to enter    Home Layout One level    Additional Comments grab bar in bathtub      Prior Function   Level of Independence Independent      Cognition   Overall Cognitive Status Within Functional Limits for tasks assessed      Posture/Postural Control   Posture Comments forward head, rounded shoulders      ROM / Strength   AROM / PROM / Strength  AROM;Strength      AROM   Overall AROM Comments WFL for B shoulders, but painful in L shoulder to elevate above 90 degrees or moving into any ext or IR      Strength   Overall Strength Comments L shoulder movements all test at 3+/5, limited due to pain with any resistance. R shoulder 4/5      Palpation   Palpation comment TTP over L supraspinatus, and bicep muscles and tendons. L GH joint demosntrates restricted depression and posterior mobility.      Special Tests    Special Tests Rotator Cuff Impingement;Thoracic Outlet Syndrome    Thoracic Outlet Syndrome  Adson Test    Rotator Cuff Impingment tests Leanord Asal test      Adson Test   Findings Negative    Side  Left      Hawkins-Kennedy test   Findings Positive    Side Left                        Objective measurements completed on examination: See above findings.       [facility] Adult PT Treatment/Exercise - 03/02/22 0001       Modalities   Modalities Vasopneumatic      Vasopneumatic   Number Minutes Vasopneumatic  15 minutes    Vasopnuematic Location  Shoulder    Vasopneumatic Pressure Low    Vasopneumatic Temperature  34      Manual Therapy   Manual Therapy Joint mobilization;Soft tissue mobilization    Joint Mobilization L GH depression and posterior mobilizations, grade 3-4 2 x 10.    Soft tissue mobilization cross friction massage to L supraspinatus and biceps tendons.                     PT Education - 03/02/22 1256     Education Details POC    Person(s) Educated Patient    Methods Explanation    Comprehension Verbalized understanding              PT Short Term Goals - 03/02/22 1305       PT SHORT TERM GOAL #1   Title I with inital HEP    Time 4    Period Weeks    Status New    Target Date 03/30/22               PT  Long Term Goals - 03/02/22 1306       PT LONG TERM GOAL #1   Title I with final HEP    Time 12    Period Weeks    Status New     Target Date 05/25/22      PT LONG TERM GOAL #2   Title Patient will achieve full, functional AROM of L shoulder with pain <3/10    Baseline Up to 8/10    Time 12    Period Weeks    Status New    Target Date 05/25/22      PT LONG TERM GOAL #3   Title Patient will achieve L shoulder strength of at least 4-/5 with pain < 3/10    Baseline 3+/5, limited by pain.    Time 12    Period Weeks    Status New    Target Date 05/25/22      PT LONG TERM GOAL #4   Title Patient will be able to bath and dress herself with pain in L shoulder of <3/10    Baseline Up to 8/10 with pulling up pants, wiping herself, donning/doffing bra.    Time 12    Period Weeks    Status New    Target Date 05/25/22                    Plan - 03/02/22 1302     Clinical Impression Statement Patient presents with 3 month H/O L shoulder pain. X ray shows OA in both GH and AC joints. She demosntrates weakness, decreased functoinal ROM, decreased ability to use the arm functionally. She will benefit from PT for pain management, strengthening, functional rehab to decrease pain and increase functional use of the arm.    Personal Factors and Comorbidities Age;Comorbidity 1    Comorbidities OA    Examination-Activity Limitations Bathing;Reach Overhead;Bed Mobility;Self Feeding;Sleep;Toileting;Hygiene/Grooming;Dressing    Examination-Participation Restrictions Cleaning;Yard Work;Laundry;Meal Prep    Stability/Clinical Decision Making Evolving/Moderate complexity    Clinical Decision Making Moderate    Rehab Potential Good    PT Frequency 2x / week    PT Duration 12 weeks    PT Treatment/Interventions ADLs/Self Care Home Management;Iontophoresis 4mg /ml Dexamethasone;Moist Heat;Ultrasound;Electrical Stimulation;Cryotherapy;Manual techniques;Dry needling;Passive range of motion;Functional mobility training;Therapeutic activities;Therapeutic exercise;Patient/family education;Vasopneumatic Device    PT Next Visit Plan  Initiate HEP    Consulted and Agree with Plan of Care Patient             Patient will benefit from skilled therapeutic intervention in order to improve the following deficits and impairments:  Decreased range of motion, Increased fascial restricitons, Pain, Decreased strength, Increased edema, Postural dysfunction, Decreased mobility, Improper body mechanics, Impaired flexibility  Visit Diagnosis: Chronic left shoulder pain  Localized edema  Muscle weakness (generalized)     Problem List Patient Active Problem List   Diagnosis Date Noted   Diverticulosis 11/16/2021   Dyslipidemia 11/16/2021   History of Clostridium difficile infection 11/16/2021   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 [place], [place], [address] Phone: [phone]   Fax:  [phone]  Name: Gloria Werner MRN: [id number] Date of Birth: 24-Jun-1941

## 2022-03-07 ENCOUNTER — Ambulatory Visit: Payer: Medicare Other | Admitting: Physical Therapy

## 2022-03-07 ENCOUNTER — Encounter: Payer: Self-pay | Admitting: Physical Therapy

## 2022-03-07 ENCOUNTER — Other Ambulatory Visit: Payer: Self-pay

## 2022-03-07 DIAGNOSIS — G8929 Other chronic pain: Secondary | ICD-10-CM | POA: Diagnosis not present

## 2022-03-07 DIAGNOSIS — R600 Localized edema: Secondary | ICD-10-CM

## 2022-03-07 DIAGNOSIS — M6281 Muscle weakness (generalized): Secondary | ICD-10-CM | POA: Diagnosis not present

## 2022-03-07 DIAGNOSIS — M25512 Pain in left shoulder: Secondary | ICD-10-CM | POA: Diagnosis not present

## 2022-03-07 NOTE — Patient Instructions (Signed)
Access Code: [email or web address] ?URL: [email or web address] ?Date: 03/07/2022 ?Prepared by: Ethel Rana ? ?Exercises ?Shoulder Extension with Resistance - 1 x daily - 7 x weekly - 2 sets - 10 reps ?Standing Row with Anchored Resistance - 1 x daily - 7 x weekly - 2 sets - 10 reps ?Shoulder External Rotation and Scapular Retraction with Resistance - 1 x daily - 7 x weekly - 2 sets - 10 reps ?Standing Shoulder Flexion Stretch on Wall - 1 x daily - 7 x weekly - 3 reps - 15 hold ?Doorway Pec Stretch at 90 Degrees Abduction - 1 x daily - 7 x weekly - 3 reps - 15 hold ? ?

## 2022-03-07 NOTE — Therapy (Signed)
[place] ?[place], [place], [address] ?Phone: [phone]   Fax:  [phone] ? ?Physical Therapy Treatment ? ?Patient Details  ?Name: Gloria Werner ?MRN: [id number] ?Date of Birth: 08/11/1941 ?Referring Provider (PT): Dr Arlester Marker ? ? ?Encounter Date: 03/07/2022 ? ? PT End of Session - 03/07/22 1102   ? ? Visit Number 2   ? Date for PT Re-Evaluation 05/25/22   ? PT Start Time 1016   ? PT Stop Time 1102   ? PT Time Calculation (min) 46 min   ? Activity Tolerance Patient tolerated treatment well;Patient limited by pain   ? Behavior During Therapy [facility] for tasks assessed/performed   ? ?  ?  ? ?  ? ? ?Past Medical History:  ?Diagnosis Date  ? Allergic rhinitis due to pollen   ? Cancer [facility])   ? Cataract   ? NS OU  ? Complication of anesthesia   ? Cough   ? De Quervain's syndrome (tenosynovitis) 07/03/2018  ? Gastroesophageal reflux disease without esophagitis   ? Heart murmur   ? Hypertension   ? Irritable bowel syndrome   ? 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 PONV (postoperative nausea and vomiting)   ? Unilateral primary osteoarthritis, 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  ? ? ?Past Surgical History:  ?Procedure Laterality Date  ? ABDOMINAL HYSTERECTOMY  1988  ? secondary to prolapse  ? BLADDER SUSPENSION    ? A-P with Hyst  ? INSERTION OF MESH  10/16/2018  ? Procedure: INSERTION OF MESH;  Surgeon: Ardis Hughs, MD;  [facility];  Service: Urology;;  ? [place]  ? ROBOTIC ASSISTED LAPAROSCOPIC SACROCOLPOPEXY N/A 10/16/2018  ? Procedure: XI ROBOTIC ASSISTED LAPAROSCOPIC SACROCOLPOPEXY;  Surgeon: Ardis Hughs, MD;  [facility];  Service: Urology;  Laterality: N/A;  ? TONSILLECTOMY    ? TOTAL HIP ARTHROPLASTY Right 11/04/2021  ? Procedure: RIGHT TOTAL HIP ARTHROPLASTY ANTERIOR APPROACH;  Surgeon: Mcarthur Rossetti, MD;  [facility];  Service: Orthopedics;  Laterality: Right;  ? ? ?There were no vitals filed for  this visit. ? ? Subjective Assessment - 03/07/22 1017   ? ? Subjective Pateint reports that her pain was severe on Sunday, possibly due to the weather front. She also finished her prescription anti inflammatories on Friday. Heat helps soem temporarioy.   ? Currently in Pain? Yes   ? Pain Score 9    ? Pain Location Shoulder   ? Pain Orientation Left   ? Pain Descriptors / Indicators Cramping   ? Pain Type Chronic pain   ? Pain Onset More than a month ago   ? Pain Frequency Intermittent   ? Aggravating Factors  pulling pants up, reaching back   ? ?  ?  ? ?  ? ? ? ? ? ? ? ? ? ? ? ? ? ? ? ? ? ? ? ? [place] Adult PT Treatment/Exercise - 03/07/22 0001   ? ?  ? Exercises  ? Exercises Shoulder   ?  ? Shoulder Exercises: Standing  ? External Rotation Strengthening;Both;10 reps;Theraband   ? Theraband Level (Shoulder External Rotation) Level 2 (Red)   ? Extension Strengthening;Both;10 reps   ? Theraband Level (Shoulder Extension) Level 2 (Red)   ? Row Strengthening;Both;10 reps   ? Theraband Level (Shoulder Row) Level 2 (Red)   ?  ? Shoulder Exercises: Stretch  ? Corner Stretch 3 reps;20 seconds   ? Warehouse manager  Limitations performed in doorway   ? Wall Stretch - Flexion 3 reps;20 seconds   ?  ? Vasopneumatic  ? Number Minutes Vasopneumatic  15 minutes   ? Vasopnuematic Location  Shoulder   ? Vasopneumatic Pressure Low   ? Vasopneumatic Temperature  34   ?  ? Manual Therapy  ? Manual Therapy Joint mobilization;Soft tissue mobilization;Passive ROM;Neural Stretch   ? Joint Mobilization L GH and AC joints mobilized into depression, posterior, 2 x 10 reps, grade 3.   ? Soft tissue mobilization STM to pect minor, lats on L.   ? Passive ROM shoulder flexion, abd IR/ER   ? Neural Stretch L brachial plexus.   ? ?  ?  ? ?  ? ? ? ? ? ? ? ? ? ? PT Education - 03/07/22 1101   ? ? Education Details Initial HEP   ? Person(s) Educated Patient   ? Methods Explanation;Demonstration;Handout   ? Comprehension Returned demonstration;Verbalized  understanding   ? ?  ?  ? ?  ? ? ? PT Short Term Goals - 03/07/22 1111   ? ?  ? PT SHORT TERM GOAL #1  ? Title I with inital HEP   ? Baseline Initiated   ? Time 4   ? Period Weeks   ? Status On-going   ? Target Date 03/30/22   ? ?  ?  ? ?  ? ? ? ? PT Long Term Goals - 03/02/22 1306   ? ?  ? PT LONG TERM GOAL #1  ? Title I with final HEP   ? Time 12   ? Period Weeks   ? Status New   ? Target Date 05/25/22   ?  ? PT LONG TERM GOAL #2  ? Title Patient will achieve full, functional AROM of L shoulder with pain <3/10   ? Baseline Up to 8/10   ? Time 12   ? Period Weeks   ? Status New   ? Target Date 05/25/22   ?  ? PT LONG TERM GOAL #3  ? Title Patient will achieve L shoulder strength of at least 4-/5 with pain < 3/10   ? Baseline 3+/5, limited by pain.   ? Time 12   ? Period Weeks   ? Status New   ? Target Date 05/25/22   ?  ? PT LONG TERM GOAL #4  ? Title Patient will be able to bath and dress herself with pain in L shoulder of <3/10   ? Baseline Up to 8/10 with pulling up pants, wiping herself, donning/doffing bra.   ? Time 12   ? Period Weeks   ? Status New   ? Target Date 05/25/22   ? ?  ?  ? ?  ? ? ? ? ? ? ? ? Plan - 03/07/22 1102   ? ? Clinical Impression Statement Patient reports exacerbation in L shoulder pain on Sunday, possibly due to the weather or the fact that she completed her anti inflammatory meds on Friday. Initiated joint mobs for [facility] and AC joints on L, along with stretching and STM to tissues. Initaited HEP for scapular stabilization and finished treatment with Vaso for pain control.   ? Personal Factors and Comorbidities Age;Comorbidity 1   ? Comorbidities OA   ? Examination-Activity Limitations Bathing;Reach Overhead;Bed Mobility;Self Feeding;Sleep;Toileting;Hygiene/Grooming;Dressing   ? Examination-Participation Restrictions Cleaning;Yard Work;Laundry;Meal Prep   ? Stability/Clinical Decision Making Evolving/Moderate complexity   ? Clinical Decision Making Moderate   ? Rehab  Potential Good   ? PT  Frequency 2x / week   ? PT Duration Other (comment)   11w  ? PT Treatment/Interventions ADLs/Self Care Home Management;Iontophoresis '4mg'$ /ml Dexamethasone;Moist Heat;Ultrasound;Electrical Stimulation;Cryotherapy;Manual techniques;Dry needling;Passive range of motion;Functional mobility training;Therapeutic activities;Therapeutic exercise;Patient/family education;Vasopneumatic Device   ? PT Next Visit Plan Assess tolerance to joint mobs, add more strengthening and stretch to shoulder girdle. Ionto since she has completed anti inflammatories?   ? PT [place]   ? Consulted and Agree with Plan of Care Patient   ? ?  ?  ? ?  ? ? ?Patient will benefit from skilled therapeutic intervention in order to improve the following deficits and impairments:  Decreased range of motion, Increased fascial restricitons, Pain, Decreased strength, Increased edema, Postural dysfunction, Decreased mobility, Improper body mechanics, Impaired flexibility ? ?Visit Diagnosis: ?Chronic left shoulder pain ? ?Localized edema ? ? ? ? ?Problem List ?Patient Active Problem List  ? Diagnosis Date Noted  ? Diverticulosis 11/16/2021  ? Dyslipidemia 11/16/2021  ? History of Clostridium difficile infection 11/16/2021  ? 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 DDD (degenerative disc disease), lumbar 09/28/2019  ? Aortic atherosclerosis ([place]) 09/28/2019  ? OSA (obstructive sleep apnea) 01/27/2019  ? Pulmonary nodule 01/27/2019  ? Allergic rhinitis 11/06/2018  ? Upper airway cough syndrome 09/24/2018  ? Essential hypertension 06/03/2018  ? Anxiety and depression 01/17/2017  ? Vitamin D deficiency 01/17/2017  ? Osteopenia 04/08/2015  ? Irritable bowel syndrome (IBS) 02/03/2008  ? ? ?Marcelina Morel, DPT ?03/07/2022, 11:12 AM ? ?[place] [facility] ?[place], [place], [address] ?Phone: [phone]   Fax:  [phone] ? ?Name: Gloria Werner ?MRN: [id number] ?Date of  Birth: [birth year] ? ? ? ?

## 2022-03-13 ENCOUNTER — Ambulatory Visit (INDEPENDENT_AMBULATORY_CARE_PROVIDER_SITE_OTHER): Payer: Medicare Other | Admitting: Family Medicine

## 2022-03-13 ENCOUNTER — Other Ambulatory Visit: Payer: Self-pay

## 2022-03-13 VITALS — BP 140/78 | HR 80 | Temp 98.1°F | Ht 59.8 in | Wt 152.4 lb

## 2022-03-13 DIAGNOSIS — R350 Frequency of micturition: Secondary | ICD-10-CM | POA: Diagnosis not present

## 2022-03-13 DIAGNOSIS — G8929 Other chronic pain: Secondary | ICD-10-CM

## 2022-03-13 DIAGNOSIS — R32 Unspecified urinary incontinence: Secondary | ICD-10-CM

## 2022-03-13 DIAGNOSIS — M25512 Pain in left shoulder: Secondary | ICD-10-CM | POA: Diagnosis not present

## 2022-03-13 DIAGNOSIS — K582 Mixed irritable bowel syndrome: Secondary | ICD-10-CM

## 2022-03-13 DIAGNOSIS — Z711 Person with feared health complaint in whom no diagnosis is made: Secondary | ICD-10-CM

## 2022-03-13 NOTE — Progress Notes (Signed)
?[facility] ?[address] ?[place] [address] ?Dept: [phone] ?Dept Fax: [phone] ? ?Office Visit ? ?Subjective:  ? ? Patient ID: Gloria Werner, female    DOB: [birth year], 81 y.o..   MRN: [id number] ? ?Chief Complaint  ?Patient presents with  ? Acute Visit  ?  C/o having abdominal soreness/pain, difficulty having a bowel movement.     ? ? ?History of Present Illness: ? ?Patient is in today for expressing concern that she may have some undetermined infection. [name] notes she has had concern for increasing girth around her middle, expressing concern about weight gain. She also had recently had some left shoulder/upper arm pain, for which she had failed an intraarticular steroid injection. I had referred her to physical therapy (she as gone twice so far). She notes she had seen her dentist recently and been prescribed 4 tablets of an antibiotic she was supposed to take prior to a procedure. Apparently she did not have the procedure. This weekend, she was at the beach. She notes she had been reading on the Internet about various symptoms she was having and noted that her symptoms could have related to an infection. She took the antibiotic tablets over two days (she does not know what the antibiotic was).  She notes this seemed to resolve her arm pain and reduce some chronic constipation issues that she struggles with. Now she is concerned that this suggests she had some sort of internal infection that had been ongoing and that has not been adequately treated. She also admits to urinary frequency and urinary incontinence. She has had prior bladder surgery for pelvic relaxation and incontinence issues. She notes that she does seem to worry quite a bit at times. She finds it not satisfactory to have any symptom of something and not know absolutely what the cause is. She gives as an example intermittent crampy mid to lower abdominal pain that  occurs after bending over and then straightening up. This doesn't happen every time (is actually infrequent and lon standing), but she doesn't like no knowing the cause. ? ?Past Medical History: ?Patient Active Problem List  ? Diagnosis Date Noted  ? Diverticulosis 11/16/2021  ? Dyslipidemia 11/16/2021  ? History of Clostridium difficile infection 11/16/2021  ? 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 DDD (degenerative disc disease), lumbar 09/28/2019  ? Aortic atherosclerosis ([place]) 09/28/2019  ? OSA (obstructive sleep apnea) 01/27/2019  ? Pulmonary nodule 01/27/2019  ? Allergic rhinitis 11/06/2018  ? Upper airway cough syndrome 09/24/2018  ? Essential hypertension 06/03/2018  ? Anxiety and depression 01/17/2017  ? Vitamin D deficiency 01/17/2017  ? Osteopenia 04/08/2015  ? Irritable bowel syndrome (IBS) 02/03/2008  ? ?Past Surgical History:  ?Procedure Laterality Date  ? ABDOMINAL HYSTERECTOMY  1988  ? secondary to prolapse  ? BLADDER SUSPENSION    ? A-P with Hyst  ? INSERTION OF MESH  10/16/2018  ? Procedure: INSERTION OF MESH;  Surgeon: Ardis Hughs, MD;  [facility];  Service: Urology;;  ? [place]  ? ROBOTIC ASSISTED LAPAROSCOPIC SACROCOLPOPEXY N/A 10/16/2018  ? Procedure: XI ROBOTIC ASSISTED LAPAROSCOPIC SACROCOLPOPEXY;  Surgeon: Ardis Hughs, MD;  [facility];  Service: Urology;  Laterality: N/A;  ? TONSILLECTOMY    ? TOTAL HIP ARTHROPLASTY Right 11/04/2021  ? Procedure: RIGHT TOTAL HIP ARTHROPLASTY ANTERIOR APPROACH;  Surgeon: Mcarthur Rossetti, MD;  [facility];  Service: Orthopedics;  Laterality: Right;  ? ?  Family History  ?Problem Relation Age of Onset  ? Emphysema Mother   ? Lymphoma Mother   ? Asthma Mother   ? Cancer Father 46  ?     lung cancer  ? Coronary artery disease Brother   ? Cancer Brother   ?     breast cancer  ? Breast cancer Brother   ? Cancer Brother   ?     lung cancer  ? Diabetes Neg Hx   ? Hypertension Neg Hx   ? Colon  cancer Neg Hx   ? Esophageal cancer Neg Hx   ? Rectal cancer Neg Hx   ? Stomach cancer Neg Hx   ? ?Outpatient Medications Prior to Visit  ?Medication Sig Dispense Refill  ? acetaminophen (TYLENOL) 500 MG tablet Take 500-1,000 mg by mouth at bedtime as needed for moderate pain.    ? albuterol (VENTOLIN HFA) 108 (90 Base) MCG/ACT inhaler Inhale 1-2 puffs into the lungs every 6 (six) hours as needed. 8 g 2  ? amLODipine (NORVASC) 5 MG tablet Take 1 tablet (5 mg total) by mouth daily. 90 tablet 3  ? Biotin 10000 MCG TABS Take 10,000 mcg by mouth daily.    ? buPROPion (WELLBUTRIN) 75 MG tablet Take 1 tablet (75 mg total) by mouth daily. (Patient taking differently: Take 75 mg by mouth daily. Reports taking "as needed".) 90 tablet 3  ? calcium carbonate (OSCAL) 1500 (600 Ca) MG TABS tablet Take 600 mg of elemental calcium by mouth daily with breakfast.    ? Carboxymethylcellul-Glycerin (LUBRICATING EYE DROPS OP) Place 1 drop into both eyes daily as needed (dry eyes).    ? Cyanocobalamin (VITAMIN B-12) 500 MCG LOZG Take 1,000 mcg by mouth daily.    ? dicyclomine (BENTYL) 10 MG capsule Take 1 capsule (10 mg total) by mouth 4 (four) times daily -  before meals and at bedtime. (Patient taking differently: Take 10 mg by mouth 4 (four) times daily as needed for spasms.) 120 capsule 0  ? meloxicam (MOBIC) 7.5 MG tablet Take 1 tablet (7.5 mg total) by mouth daily. 90 tablet 1  ? polyethylene glycol (MIRALAX / GLYCOLAX) 17 g packet Take 17 g by mouth daily. Takes an extra scoop in the evening as needed for moderate-severe constipation.    ? pyridOXINE (VITAMIN B-6) 100 MG tablet Take 100 mg by mouth daily.    ? rosuvastatin (CRESTOR) 5 MG tablet Take 1 tablet (5 mg total) by mouth 3 (three) times a week. 36 tablet 1  ? saccharomyces boulardii (FLORASTOR) 250 MG capsule Take 2 capsules (500 mg total) by mouth 2 (two) times daily. (Patient taking differently: Take 250 mg by mouth daily.) 120 capsule 3  ? sodium chloride (OCEAN) 0.65  % SOLN nasal spray Place 1 spray into both nostrils 2 (two) times daily as needed for congestion.    ? vitamin C (ASCORBIC ACID) 500 MG tablet Take 500 mg by mouth daily.    ? Vitamin D, Ergocalciferol, (DRISDOL) 1.25 MG (50000 UNIT) CAPS capsule Take 1 capsule (50,000 Units total) by mouth every 30 (thirty) days. 3 capsule 3  ? naproxen (NAPROSYN) 500 MG tablet Take 1 tablet (500 mg total) by mouth 2 (two) times daily with a meal. 28 tablet 0  ? ?No facility-administered medications prior to visit.  ? ?Allergies  ?Allergen Reactions  ? Lexapro [Escitalopram] Diarrhea and Other (See Comments)  ?  headache  ? Ace Inhibitors Cough  ? Codeine Nausea And Vomiting  ? ?  Objective:  ? ?Today's Vitals  ? 03/13/22 0816  ?BP: 140/78  ?Pulse: 80  ?Temp: 98.1 ?F (36.7 ?C)  ?TempSrc: Temporal  ?SpO2: 99%  ?Weight: 152 lb 6.4 oz (69.1 kg)  ?Height: 4' 11.8" (1.519 m)  ? ?Body mass index is 29.96 kg/m?.  ? ?General: Well developed, well nourished. No acute distress. ?Abdomen: Soft, non-tender. Bowel sounds positive, normal pitch and frequency. No  ? hepatosplenomegaly. No rebound or guarding. ?Psych: Alert and oriented. Normal mood and affect. ? ?There are no preventive care reminders to display for this patient. ? ?Lab Results ?Last CBC ?Lab Results  ?Component Value Date  ? WBC 4.4 02/17/2022  ? HGB 13.0 02/17/2022  ? HCT 39.8 02/17/2022  ? MCV 89.3 02/17/2022  ? MCH 30.1 11/05/2021  ? RDW 13.9 02/17/2022  ? PLT 185.0 02/17/2022  ? ?Last metabolic panel ?Lab Results  ?Component Value Date  ? GLUCOSE 95 02/17/2022  ? NA 140 02/17/2022  ? K 4.6 02/17/2022  ? CL 105 02/17/2022  ? CO2 32 02/17/2022  ? BUN 15 02/17/2022  ? CREATININE 0.72 02/17/2022  ? GFRNONAA >60 11/05/2021  ? CALCIUM 9.3 02/17/2022  ? PROT 6.7 02/17/2022  ? ALBUMIN 4.4 02/17/2022  ? BILITOT 0.4 02/17/2022  ? ALKPHOS 54 02/17/2022  ? AST 19 02/17/2022  ? ALT 13 02/17/2022  ? ANIONGAP 6 11/05/2021  ? ?Last hemoglobin A1c ?Lab Results  ?Component Value Date  ? HGBA1C  5.6 02/17/2022  ?   ?Assessment & Plan:  ? ?1. Feared complaint without diagnosis ?Ms. Echeverria has a fear that there is something wrong, potentially infection-wise. I reassured her that her recent

## 2022-03-14 ENCOUNTER — Encounter: Payer: Self-pay | Admitting: Family Medicine

## 2022-03-14 ENCOUNTER — Ambulatory Visit: Payer: Medicare Other | Admitting: Physical Therapy

## 2022-03-14 ENCOUNTER — Encounter: Payer: Self-pay | Admitting: Physical Therapy

## 2022-03-14 DIAGNOSIS — M25512 Pain in left shoulder: Secondary | ICD-10-CM | POA: Diagnosis not present

## 2022-03-14 DIAGNOSIS — R600 Localized edema: Secondary | ICD-10-CM | POA: Diagnosis not present

## 2022-03-14 DIAGNOSIS — G8929 Other chronic pain: Secondary | ICD-10-CM

## 2022-03-14 DIAGNOSIS — M6281 Muscle weakness (generalized): Secondary | ICD-10-CM

## 2022-03-14 NOTE — Therapy (Signed)
[facility] [address] ?[place], [place], [address] ?Phone: [phone]   Fax:  [phone] ? ?Physical Therapy Treatment ? ?Patient Details  ?Name: Gloria Werner ?MRN: [id number] ?Date of Birth: 1941/11/26 ?Referring Provider (PT): Dr Arlester Marker ? ? ?Encounter Date: 03/14/2022 ? ? PT End of Session - 03/14/22 1151   ? ? Visit Number 3   ? Date for PT Re-Evaluation 05/25/22   ? PT Start Time 1102   ? PT Stop Time 3875   ? PT Time Calculation (min) 41 min   ? Activity Tolerance Patient tolerated treatment well   ? Behavior During Therapy [facility] for tasks assessed/performed   ? ?  ?  ? ?  ? ? ?Past Medical History:  ?Diagnosis Date  ? Allergic rhinitis due to pollen   ? Cancer [facility])   ? Cataract   ? NS OU  ? Complication of anesthesia   ? Cough   ? De Quervain's syndrome (tenosynovitis) 07/03/2018  ? Gastroesophageal reflux disease without esophagitis   ? Heart murmur   ? Hypertension   ? Irritable bowel syndrome   ? 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 PONV (postoperative nausea and vomiting)   ? Unilateral primary osteoarthritis, 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  ? ? ?Past Surgical History:  ?Procedure Laterality Date  ? ABDOMINAL HYSTERECTOMY  1988  ? secondary to prolapse  ? BLADDER SUSPENSION    ? A-P with Hyst  ? INSERTION OF MESH  10/16/2018  ? Procedure: INSERTION OF MESH;  Surgeon: Ardis Hughs, MD;  [facility];  Service: Urology;;  ? [place]  ? ROBOTIC ASSISTED LAPAROSCOPIC SACROCOLPOPEXY N/A 10/16/2018  ? Procedure: XI ROBOTIC ASSISTED LAPAROSCOPIC SACROCOLPOPEXY;  Surgeon: Ardis Hughs, MD;  [facility];  Service: Urology;  Laterality: N/A;  ? TONSILLECTOMY    ? TOTAL HIP ARTHROPLASTY Right 11/04/2021  ? Procedure: RIGHT TOTAL HIP ARTHROPLASTY ANTERIOR APPROACH;  Surgeon: Mcarthur Rossetti, MD;  [facility];  Service: Orthopedics;  Laterality: Right;  ? ? ?There were no vitals filed for this visit. ? ?  Subjective Assessment - 03/14/22 1105   ? ? Subjective I feel like my shoulder is getting better, I'm not in constant pain like I was before. My biggest problem is reaching back behind me.   ? Pertinent History R THR 11/22   ? Patient Stated Goals Decrease the pain in her shoulder, be able to dress and wash without pain.   ? Currently in Pain? Yes   ? Pain Score 5    ? Pain Location Shoulder   ? Pain Orientation Left   ? Pain Descriptors / Indicators Cramping;Tightness   ? Pain Type Chronic pain   ? ?  ?  ? ?  ? ? ? ? ? ? ? ? ? ? ? ? ? ? ? ? ? ? ? ? [facility] Adult PT Treatment/Exercise - 03/14/22 0001   ? ?  ? Shoulder Exercises: Seated  ? Other Seated Exercises UBE L3 3 min forward/3 min backward   ? Other Seated Exercises thoracic excursions all directions 1x15 each   ?  ? Shoulder Exercises: Standing  ? Other Standing Exercises standing shoulder flexion and ABD stretches pillow case on wall 10x5 second holds each   ?  ? Shoulder Exercises: Stretch  ? Corner Stretch 2 reps;30 seconds   ? Corner Stretch Limitations pec major stretch   ? Other Shoulder Stretches pec minor stretch  in doorway 2x30 seconds   ? Other Shoulder Stretches UT stretch 2x30 seconds B   ?  ? Shoulder Exercises: Power Tower  ? Extension 10 reps   ? Extension Limitations 10#   ? Row 10 reps   ? Row Limitations 10#   2 sets  ? ABduction 10 reps   ? ABduction Limitations 2 sets 1#   ? Other Power Engineer, water lat pulls 10# 2x10   ? Other Power Tower Exercises bicep curls 3# 2x10B, tricep pull downs 15# 2x10   ? ?  ?  ? ?  ? ? ? ? ? ? ? ? ? ? PT Education - 03/14/22 1151   ? ? Education Details possible DOMS following progression into more intense strength training, importance of strength/ROM in managing shoulder pain   ? Person(s) Educated Patient   ? Methods Explanation   ? Comprehension Verbalized understanding   ? ?  ?  ? ?  ? ? ? PT Short Term Goals - 03/07/22 1111   ? ?  ? PT SHORT TERM GOAL #1  ? Title I with inital HEP   ? Baseline Initiated    ? Time 4   ? Period Weeks   ? Status On-going   ? Target Date 03/30/22   ? ?  ?  ? ?  ? ? ? ? PT Long Term Goals - 03/02/22 1306   ? ?  ? PT LONG TERM GOAL #1  ? Title I with final HEP   ? Time 12   ? Period Weeks   ? Status New   ? Target Date 05/25/22   ?  ? PT LONG TERM GOAL #2  ? Title Patient will achieve full, functional AROM of L shoulder with pain <3/10   ? Baseline Up to 8/10   ? Time 12   ? Period Weeks   ? Status New   ? Target Date 05/25/22   ?  ? PT LONG TERM GOAL #3  ? Title Patient will achieve L shoulder strength of at least 4-/5 with pain < 3/10   ? Baseline 3+/5, limited by pain.   ? Time 12   ? Period Weeks   ? Status New   ? Target Date 05/25/22   ?  ? PT LONG TERM GOAL #4  ? Title Patient will be able to bath and dress herself with pain in L shoulder of <3/10   ? Baseline Up to 8/10 with pulling up pants, wiping herself, donning/doffing bra.   ? Time 12   ? Period Weeks   ? Status New   ? Target Date 05/25/22   ? ?  ?  ? ?  ? ? ? ? ? ? ? ? Plan - 03/14/22 1152   ? ? Clinical Impression Statement Woodrow arrives doing OK, just very anxious overall and needed a lot of reassurance today as we went along. Her shoulder is feeling better, she does still report a lot of cramping and it sounds to me like some of her pain may just be driven from mm weakness and spasm around the shoulder joint especially since she responds so well to heat at home. I don?t know that we need to start ionto just yet since she just finished anti-inflammatories  and her pain is getting better anyway, will monitor and we can try this if it seems beneficial later on. Pain down to 2/10 at EOS.   ? Personal Factors and Comorbidities Age;Comorbidity 1   ?  Comorbidities OA   ? Examination-Activity Limitations Bathing;Reach Overhead;Bed Mobility;Self Feeding;Sleep;Toileting;Hygiene/Grooming;Dressing   ? Examination-Participation Restrictions Cleaning;Yard Work;Laundry;Meal Prep   ? Stability/Clinical Decision Making  Evolving/Moderate complexity   ? Clinical Decision Making Moderate   ? Rehab Potential Good   ? PT Frequency 2x / week   ? PT Duration Other (comment)   ? PT Treatment/Interventions ADLs/Self Care Home Management;Iontophoresis '4mg'$ /ml Dexamethasone;Moist Heat;Ultrasound;Electrical Stimulation;Cryotherapy;Manual techniques;Dry needling;Passive range of motion;Functional mobility training;Therapeutic activities;Therapeutic exercise;Patient/family education;Vasopneumatic Device   ? PT Next Visit Plan continue with strengthening and stretching, join mobs PRN   ? PT Home Exercise Plan [id number]   ? Consulted and Agree with Plan of Care Patient   ? ?  ?  ? ?  ? ? ?Patient will benefit from skilled therapeutic intervention in order to improve the following deficits and impairments:  Decreased range of motion, Increased fascial restricitons, Pain, Decreased strength, Increased edema, Postural dysfunction, Decreased mobility, Improper body mechanics, Impaired flexibility ? ?Visit Diagnosis: ?Chronic left shoulder pain ? ?Localized edema ? ?Muscle weakness (generalized) ? ? ? ? ?Problem List ?Patient Active Problem List  ? Diagnosis Date Noted  ? Diverticulosis 11/16/2021  ? Dyslipidemia 11/16/2021  ? History of Clostridium difficile infection 11/16/2021  ? 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 DDD (degenerative disc disease), lumbar 09/28/2019  ? Aortic atherosclerosis ([place]) 09/28/2019  ? OSA (obstructive sleep apnea) 01/27/2019  ? Pulmonary nodule 01/27/2019  ? Allergic rhinitis 11/06/2018  ? Upper airway cough syndrome 09/24/2018  ? Essential hypertension 06/03/2018  ? Anxiety and depression 01/17/2017  ? Vitamin D deficiency 01/17/2017  ? Osteopenia 04/08/2015  ? Irritable bowel syndrome (IBS) 02/03/2008  ? ?Ivie Maese U PT, DPT, PN2  ? ?Supplemental Physical Therapist ?[place]  ? ? ? ? ?[place] ?[address] ?[place], [place], [address] ?Phone:  [phone]   Fax:  [phone] ? ?Name: Gloria Werner ?MRN: [id number] ?Date of Birth: [birth year] ? ? ? ?

## 2022-03-16 ENCOUNTER — Other Ambulatory Visit: Payer: Self-pay

## 2022-03-16 ENCOUNTER — Ambulatory Visit: Payer: Medicare Other | Admitting: Physical Therapy

## 2022-03-16 ENCOUNTER — Encounter: Payer: Self-pay | Admitting: Physical Therapy

## 2022-03-16 DIAGNOSIS — G8929 Other chronic pain: Secondary | ICD-10-CM

## 2022-03-16 DIAGNOSIS — R600 Localized edema: Secondary | ICD-10-CM

## 2022-03-16 DIAGNOSIS — M6281 Muscle weakness (generalized): Secondary | ICD-10-CM

## 2022-03-16 DIAGNOSIS — M25512 Pain in left shoulder: Secondary | ICD-10-CM | POA: Diagnosis not present

## 2022-03-16 NOTE — Therapy (Signed)
[place] [address] ?[place], [place], [address] ?Phone: [phone]   Fax:  [phone] ? ?Physical Therapy Treatment ? ?Patient Details  ?Name: Gloria Werner ?MRN: [id number] ?Date of Birth: [birth year] ?Referring Provider (PT): Dr Arlester Marker ? ? ?Encounter Date: 03/16/2022 ? ? PT End of Session - 03/16/22 1150   ? ? Visit Number 4   ? Date for PT Re-Evaluation 05/25/22   ? PT Start Time 1101   ? PT Stop Time 1141   ? PT Time Calculation (min) 40 min   ? Activity Tolerance Patient tolerated treatment well   ? Behavior During Therapy [facility] for tasks assessed/performed   ? ?  ?  ? ?  ? ? ?Past Medical History:  ?Diagnosis Date  ? Allergic rhinitis due to pollen   ? Cancer [facility])   ? Cataract   ? NS OU  ? Complication of anesthesia   ? Cough   ? De Quervain's syndrome (tenosynovitis) 07/03/2018  ? Gastroesophageal reflux disease without esophagitis   ? Heart murmur   ? Hypertension   ? Irritable bowel syndrome   ? 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 PONV (postoperative nausea and vomiting)   ? Unilateral primary osteoarthritis, 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  ? ? ?Past Surgical History:  ?Procedure Laterality Date  ? ABDOMINAL HYSTERECTOMY  1988  ? secondary to prolapse  ? BLADDER SUSPENSION    ? A-P with Hyst  ? INSERTION OF MESH  10/16/2018  ? Procedure: INSERTION OF MESH;  Surgeon: Ardis Hughs, MD;  [facility]: [facility];  Service: Urology;;  ? [place]  ? ROBOTIC ASSISTED LAPAROSCOPIC SACROCOLPOPEXY N/A 10/16/2018  ? Procedure: XI ROBOTIC ASSISTED LAPAROSCOPIC SACROCOLPOPEXY;  Surgeon: Ardis Hughs, MD;  [facility];  Service: Urology;  Laterality: N/A;  ? TONSILLECTOMY    ? TOTAL HIP ARTHROPLASTY Right 11/04/2021  ? Procedure: RIGHT TOTAL HIP ARTHROPLASTY ANTERIOR APPROACH;  Surgeon: Mcarthur Rossetti, MD;  [facility];  Service: Orthopedics;  Laterality: Right;  ? ? ?There were no vitals filed for this visit. ? ?  Subjective Assessment - 03/16/22 1104   ? ? Subjective I wasn't as sore as I thought I was going to be from last time, the pain sort of moves around though.  Sometimes its at the top of the shoulder, sometimes on the side, I don't know why   ? Pertinent History R THR 11/22   ? Patient Stated Goals Decrease the pain in her shoulder, be able to dress and wash without pain.   ? Currently in Pain? Yes   ? Pain Score 4    ? Pain Location Shoulder   ? Pain Orientation Left   ? Pain Descriptors / Indicators Cramping;Tightness   ? Pain Type Chronic pain   ? ?  ?  ? ?  ? ? ? ? ? ? ? ? ? ? ? ? ? ? ? ? ? ? ? ? [place] Adult PT Treatment/Exercise - 03/16/22 0001   ? ?  ? Shoulder Exercises: Seated  ? Other Seated Exercises UBE L3 3 min forward/3 min backward   ?  ? Shoulder Exercises: Standing  ? Other Standing Exercises standing shoulder flexion and ABD stretches pillow case on wall 10x10 second holds each; also ER stretch 10x5 second holds with towel under elbow; bicep stretch 3x30 seconds L UE; combined bicep/chest stretch with arms clasped behind back 2x30 seconds   ? Other Standing  Exercises yellow WaTe bar: shoulder extensions 1x10   ?  ? Shoulder Exercises: Power Tower  ? Extension 15 reps   ? Extension Limitations 10#   ? Row 15 reps   ? Row Limitations 10#   then 20# 1x7  ? External Rotation 5 reps   ? External Rotation Limitations towel tucked under elbow 2x5 5# and 3# L UE   ? Other Power Engineer, water lat pulls 10# 1x15 then 20# 1x7;  tricep curls 20# x10   ? Other Power [employer] Exercises 3 way shoulder f/a/e 2x5 with 3#; bicep curl to [place] press 3# 1x10 B   ? ?  ?  ? ?  ? ? ? ? ? ? ? ? ? ? PT Education - 03/16/22 1150   ? ? Education Details exercise form and purpose   ? Person(s) Educated Patient   ? Methods Explanation   ? Comprehension Verbalized understanding   ? ?  ?  ? ?  ? ? ? PT Short Term Goals - 03/07/22 1111   ? ?  ? PT SHORT TERM GOAL #1  ? Title I with inital HEP   ? Baseline Initiated   ? Time 4   ? Period  Weeks   ? Status On-going   ? Target Date 03/30/22   ? ?  ?  ? ?  ? ? ? ? PT Long Term Goals - 03/02/22 1306   ? ?  ? PT LONG TERM GOAL #1  ? Title I with final HEP   ? Time 12   ? Period Weeks   ? Status New   ? Target Date 05/25/22   ?  ? PT LONG TERM GOAL #2  ? Title Patient will achieve full, functional AROM of L shoulder with pain <3/10   ? Baseline Up to 8/10   ? Time 12   ? Period Weeks   ? Status New   ? Target Date 05/25/22   ?  ? PT LONG TERM GOAL #3  ? Title Patient will achieve L shoulder strength of at least 4-/5 with pain < 3/10   ? Baseline 3+/5, limited by pain.   ? Time 12   ? Period Weeks   ? Status New   ? Target Date 05/25/22   ?  ? PT LONG TERM GOAL #4  ? Title Patient will be able to bath and dress herself with pain in L shoulder of <3/10   ? Baseline Up to 8/10 with pulling up pants, wiping herself, donning/doffing bra.   ? Time 12   ? Period Weeks   ? Status New   ? Target Date 05/25/22   ? ?  ?  ? ?  ? ? ? ? ? ? ? ? Plan - 03/16/22 1150   ? ? Clinical Impression Statement Gloria Werner arrives doing OK today, her pain is a little better but still staying in that shoulder. She tells me it feels like it moves around from the top of her shoulder to the side. Very tight in upper traps. We continued working on functional strengthening and stretching today since this helped her so much the other day. She does tend to be a bit anxious when it comes to pain and symptoms. Will continue to progress as able and tolerated.   ? Personal Factors and Comorbidities Age;Comorbidity 1   ? Comorbidities OA   ? Examination-Activity Limitations Bathing;Reach Overhead;Bed Mobility;Self Feeding;Sleep;Toileting;Hygiene/Grooming;Dressing   ? Examination-Participation Restrictions Cleaning;Yard Work;Laundry;Meal Prep   ?  Stability/Clinical Decision Making Evolving/Moderate complexity   ? Clinical Decision Making Moderate   ? Rehab Potential Good   ? PT Frequency 2x / week   ? PT Duration Other (comment)   ? PT  Treatment/Interventions ADLs/Self Care Home Management;Iontophoresis '4mg'$ /ml Dexamethasone;Moist Heat;Ultrasound;Electrical Stimulation;Cryotherapy;Manual techniques;Dry needling;Passive range of motion;Functional mobility training;Therapeutic activities;Therapeutic exercise;Patient/family education;Vasopneumatic Device   ? PT Next Visit Plan continue with strengthening and stretching, join mobs PRN. DN to L bicep?   ? PT Home Exercise Plan [email or web address]   ? Consulted and Agree with Plan of Care Patient   ? ?  ?  ? ?  ? ? ?Patient will benefit from skilled therapeutic intervention in order to improve the following deficits and impairments:  Decreased range of motion, Increased fascial restricitons, Pain, Decreased strength, Increased edema, Postural dysfunction, Decreased mobility, Improper body mechanics, Impaired flexibility ? ?Visit Diagnosis: ?Chronic left shoulder pain ? ?Localized edema ? ?Muscle weakness (generalized) ? ? ? ? ?Problem List ?Patient Active Problem List  ? Diagnosis Date Noted  ? Diverticulosis 11/16/2021  ? Dyslipidemia 11/16/2021  ? History of Clostridium difficile infection 11/16/2021  ? 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 DDD (degenerative disc disease), lumbar 09/28/2019  ? Aortic atherosclerosis ([place]) 09/28/2019  ? OSA (obstructive sleep apnea) 01/27/2019  ? Pulmonary nodule 01/27/2019  ? Allergic rhinitis 11/06/2018  ? Upper airway cough syndrome 09/24/2018  ? Essential hypertension 06/03/2018  ? Anxiety and depression 01/17/2017  ? Vitamin D deficiency 01/17/2017  ? Osteopenia 04/08/2015  ? Irritable bowel syndrome (IBS) 02/03/2008  ? ?Gloria Werner U PT, DPT, PN2  ? ?Supplemental Physical Therapist ?[place]  ? ? ? ? ? ?[place] [address] ?[place], [place], [address] ?Phone: [phone]   Fax:  [phone] ? ?Name: Gloria Werner ?MRN: [id number] ?Date of Birth: 1941-08-28 ? ? ? ?

## 2022-03-17 LAB — URINALYSIS W MICROSCOPIC + REFLEX CULTURE
Bilirubin Urine: NEGATIVE
Glucose, UA: NEGATIVE
Hgb urine dipstick: NEGATIVE
Hyaline Cast: NONE SEEN /LPF
Ketones, ur: NEGATIVE
Nitrites, Initial: NEGATIVE
Protein, ur: NEGATIVE
RBC / HPF: NONE SEEN /HPF (ref 0–2)
Specific Gravity, Urine: 1.01 (ref 1.001–1.035)
Squamous Epithelial / HPF: NONE SEEN /HPF (ref <=5)
pH: 6.5 (ref 5.0–8.0)

## 2022-03-17 LAB — URINE CULTURE
MICRO NUMBER:: 13158477
SPECIMEN QUALITY:: ADEQUATE

## 2022-03-17 LAB — CULTURE INDICATED

## 2022-03-20 ENCOUNTER — Encounter: Payer: Self-pay | Admitting: Family Medicine

## 2022-03-20 DIAGNOSIS — I10 Essential (primary) hypertension: Secondary | ICD-10-CM

## 2022-03-21 ENCOUNTER — Ambulatory Visit: Payer: Medicare Other | Admitting: Physical Therapy

## 2022-03-21 ENCOUNTER — Other Ambulatory Visit: Payer: Self-pay

## 2022-03-21 ENCOUNTER — Encounter: Payer: Self-pay | Admitting: Physical Therapy

## 2022-03-21 DIAGNOSIS — M6281 Muscle weakness (generalized): Secondary | ICD-10-CM | POA: Diagnosis not present

## 2022-03-21 DIAGNOSIS — R600 Localized edema: Secondary | ICD-10-CM | POA: Diagnosis not present

## 2022-03-21 DIAGNOSIS — M25512 Pain in left shoulder: Secondary | ICD-10-CM | POA: Diagnosis not present

## 2022-03-21 DIAGNOSIS — G8929 Other chronic pain: Secondary | ICD-10-CM

## 2022-03-21 MED ORDER — AMLODIPINE BESYLATE 5 MG PO TABS: 5.0000 mg | ORAL_TABLET | Freq: Every day | ORAL | 1 refills | Status: DC

## 2022-03-21 NOTE — Therapy (Signed)
[place] ?[place] [address] ?[place], [place], [address] ?Phone: [phone]   Fax:  [phone] ? ?Physical Therapy Treatment ? ?Patient Details  ?Name: Gloria Werner ?MRN: [id number] ?Date of Birth: 01-Sep-1941 ?Referring Provider (PT): Dr Arlester Marker ? ? ?Encounter Date: 03/21/2022 ? ? PT End of Session - 03/21/22 1150   ? ? Visit Number 5   ? Date for PT Re-Evaluation 05/25/22   ? PT Start Time 1105   ? PT Stop Time 1144   ? PT Time Calculation (min) 39 min   ? Activity Tolerance Patient tolerated treatment well   ? Behavior During Therapy [facility] for tasks assessed/performed   ? ?  ?  ? ?  ? ? ?Past Medical History:  ?Diagnosis Date  ? Allergic rhinitis due to pollen   ? Cancer [facility])   ? Cataract   ? NS OU  ? Complication of anesthesia   ? Cough   ? De Quervain's syndrome (tenosynovitis) 07/03/2018  ? Gastroesophageal reflux disease without esophagitis   ? Heart murmur   ? Hypertension   ? Irritable bowel syndrome   ? 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 PONV (postoperative nausea and vomiting)   ? Unilateral primary osteoarthritis, 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  ? ? ?Past Surgical History:  ?Procedure Laterality Date  ? ABDOMINAL HYSTERECTOMY  1988  ? secondary to prolapse  ? BLADDER SUSPENSION    ? A-P with Hyst  ? INSERTION OF MESH  10/16/2018  ? Procedure: INSERTION OF MESH;  Surgeon: Ardis Hughs, MD;  [facility];  Service: Urology;;  ? [place]  ? ROBOTIC ASSISTED LAPAROSCOPIC SACROCOLPOPEXY N/A 10/16/2018  ? Procedure: XI ROBOTIC ASSISTED LAPAROSCOPIC SACROCOLPOPEXY;  Surgeon: Ardis Hughs, MD;  [facility];  Service: Urology;  Laterality: N/A;  ? TONSILLECTOMY    ? TOTAL HIP ARTHROPLASTY Right 11/04/2021  ? Procedure: RIGHT TOTAL HIP ARTHROPLASTY ANTERIOR APPROACH;  Surgeon: Mcarthur Rossetti, MD;  [facility];  Service: Orthopedics;  Laterality: Right;  ? ? ?There were no vitals filed for this visit. ? ?  Subjective Assessment - 03/21/22 1108   ? ? Subjective My shoulder has been feeling better, after last time my bicep was really cramping and spasming and it was very strange. I want to know when I can get back to pickle ball.   ? Pertinent History R THR 11/22   ? Patient Stated Goals Decrease the pain in her shoulder, be able to dress and wash without pain.   ? Currently in Pain? Yes   ? Pain Score 2    ? Pain Location Other (Comment)   bicep L  ? Pain Orientation Left   ? Pain Descriptors / Indicators Tightness;Cramping   ? Pain Type Chronic pain   ? ?  ?  ? ?  ? ? ? ? ? ? ? ? ? ? ? ? ? ? ? ? ? ? ? ? [place] Adult PT Treatment/Exercise - 03/21/22 0001   ? ?  ? Shoulder Exercises: Seated  ? Other Seated Exercises UBE L3 3 min forward/3 min backward   ?  ? Shoulder Exercises: Standing  ? Other Standing Exercises shoulders on fire x20 forward/x20 backwards   ?  ? Shoulder Exercises: Stretch  ? Other Shoulder Stretches bicep stretch on wall 3x30 seconds L UE   ? Other Shoulder Stretches shoulder flexion stretch on ladder 10x10 second holds   ?  ? Shoulder  Exercises: Power Tower  ? Extension 10 reps   ? Extension Limitations 15#   ? Row 15 reps   ? Row Limitations 15#   then 20# 1x10  ? Other Power Engineer, water lat pulls 20# x10 2 sets; tricep pull downs 2x10 20#   ? Other Power [employer] Exercises 3 way shoulder f/a/e 2x7 with 3#; bicep curl to [facility] press 3# 2x10 B   ?  ? Manual Therapy  ? Manual Therapy Soft tissue mobilization   ? Soft tissue mobilization STM L bicep to tolerance   ? ?  ?  ? ?  ? ? ? ? ? ? ? ? ? ? PT Education - 03/21/22 1149   ? ? Education Details progress with PT, doing well and likely will only need 5-7 more sessions. Hold off on pickle ball until we can get her a bit stronger/DC from PT   ? Person(s) Educated Patient   ? Methods Explanation   ? Comprehension Verbalized understanding   ? ?  ?  ? ?  ? ? ? PT Short Term Goals - 03/07/22 1111   ? ?  ? PT SHORT TERM GOAL #1  ? Title I with inital HEP   ?  Baseline Initiated   ? Time 4   ? Period Weeks   ? Status On-going   ? Target Date 03/30/22   ? ?  ?  ? ?  ? ? ? ? PT Long Term Goals - 03/02/22 1306   ? ?  ? PT LONG TERM GOAL #1  ? Title I with final HEP   ? Time 12   ? Period Weeks   ? Status New   ? Target Date 05/25/22   ?  ? PT LONG TERM GOAL #2  ? Title Patient will achieve full, functional AROM of L shoulder with pain <3/10   ? Baseline Up to 8/10   ? Time 12   ? Period Weeks   ? Status New   ? Target Date 05/25/22   ?  ? PT LONG TERM GOAL #3  ? Title Patient will achieve L shoulder strength of at least 4-/5 with pain < 3/10   ? Baseline 3+/5, limited by pain.   ? Time 12   ? Period Weeks   ? Status New   ? Target Date 05/25/22   ?  ? PT LONG TERM GOAL #4  ? Title Patient will be able to bath and dress herself with pain in L shoulder of <3/10   ? Baseline Up to 8/10 with pulling up pants, wiping herself, donning/doffing bra.   ? Time 12   ? Period Weeks   ? Status New   ? Target Date 05/25/22   ? ?  ?  ? ?  ? ? ? ? ? ? ? ? Plan - 03/21/22 1150   ? ? Clinical Impression Statement Gloria Werner arrives today doing well, her shoulder is doing much better and the discomfort is mostly in her L bicep. We did find last time that L bicep group is quite weak and tight. Continued to spend time working on focused functional strengthening for B shoulders and biceps, and finished session with STM and stretching to L bicep as well. LUE still quite a bit weaker than the R. Progressing well, does remain anxious about pain at times.   ? Personal Factors and Comorbidities Age;Comorbidity 1   ? Comorbidities OA   ? Examination-Activity Limitations Bathing;Reach Overhead;Bed Mobility;Self Feeding;Sleep;Toileting;Hygiene/Grooming;Dressing   ?  Examination-Participation Restrictions Cleaning;Yard Work;Laundry;Meal Prep   ? Stability/Clinical Decision Making Evolving/Moderate complexity   ? Clinical Decision Making Moderate   ? Rehab Potential Good   ? PT Frequency 2x / week   ? PT  Duration Other (comment)   ? PT Treatment/Interventions ADLs/Self Care Home Management;Iontophoresis '4mg'$ /ml Dexamethasone;Moist Heat;Ultrasound;Electrical Stimulation;Cryotherapy;Manual techniques;Dry needling;Passive range of motion;Functional mobility training;Therapeutic activities;Therapeutic exercise;Patient/family education;Vasopneumatic Device   ? PT Next Visit Plan focus on strengthening, LUE still very weak. update HEP   ? PT Home Exercise Plan [email or web address]   ? Consulted and Agree with Plan of Care Patient   ? ?  ?  ? ?  ? ? ?Patient will benefit from skilled therapeutic intervention in order to improve the following deficits and impairments:  Decreased range of motion, Increased fascial restricitons, Pain, Decreased strength, Increased edema, Postural dysfunction, Decreased mobility, Improper body mechanics, Impaired flexibility ? ?Visit Diagnosis: ?Chronic left shoulder pain ? ?Localized edema ? ?Muscle weakness (generalized) ? ? ? ? ?Problem List ?Patient Active Problem List  ? Diagnosis Date Noted  ? Diverticulosis 11/16/2021  ? Dyslipidemia 11/16/2021  ? History of Clostridium difficile infection 11/16/2021  ? 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 DDD (degenerative disc disease), lumbar 09/28/2019  ? Aortic atherosclerosis ([place]) 09/28/2019  ? OSA (obstructive sleep apnea) 01/27/2019  ? Pulmonary nodule 01/27/2019  ? Allergic rhinitis 11/06/2018  ? Upper airway cough syndrome 09/24/2018  ? Essential hypertension 06/03/2018  ? Anxiety and depression 01/17/2017  ? Vitamin D deficiency 01/17/2017  ? Osteopenia 04/08/2015  ? Irritable bowel syndrome (IBS) 02/03/2008  ? ?Glenetta Kiger U PT, DPT, PN2  ? ?Supplemental Physical Therapist ?[facility]  ? ? ? ? ? ?[place] [address] ?[place], [place], [address] ?Phone: [phone]   Fax:  [phone] ? ?Name: Gloria Werner ?MRN: [id number] ?Date of Birth: 06/18/1941 ? ? ? ?

## 2022-03-23 ENCOUNTER — Ambulatory Visit: Payer: Medicare Other | Admitting: Physical Therapy

## 2022-03-23 ENCOUNTER — Encounter: Payer: Self-pay | Admitting: Physical Therapy

## 2022-03-23 DIAGNOSIS — R600 Localized edema: Secondary | ICD-10-CM | POA: Diagnosis not present

## 2022-03-23 DIAGNOSIS — G8929 Other chronic pain: Secondary | ICD-10-CM | POA: Diagnosis not present

## 2022-03-23 DIAGNOSIS — M6281 Muscle weakness (generalized): Secondary | ICD-10-CM | POA: Diagnosis not present

## 2022-03-23 DIAGNOSIS — G4733 Obstructive sleep apnea (adult) (pediatric): Secondary | ICD-10-CM | POA: Diagnosis not present

## 2022-03-23 DIAGNOSIS — M25512 Pain in left shoulder: Secondary | ICD-10-CM | POA: Diagnosis not present

## 2022-03-23 NOTE — Therapy (Signed)
[place] ?[place] [address] ?[place], [place], [address] ?Phone: [phone]   Fax:  [phone] ? ?Physical Therapy Treatment ? ?Patient Details  ?Name: Gloria Werner ?MRN: [id number] ?Date of Birth: [birth year] ?Referring Provider (PT): Dr Arlester Marker ? ? ?Encounter Date: 03/23/2022 ? ? PT End of Session - 03/23/22 1518   ? ? Visit Number 6   ? PT Start Time 0175   ? PT Stop Time 1526   ? PT Time Calculation (min) 41 min   ? Activity Tolerance Patient tolerated treatment well   ? Behavior During Therapy [facility] for tasks assessed/performed   ? ?  ?  ? ?  ? ? ?Past Medical History:  ?Diagnosis Date  ? Allergic rhinitis due to pollen   ? Cancer [facility])   ? Cataract   ? NS OU  ? Complication of anesthesia   ? Cough   ? De Quervain's syndrome (tenosynovitis) 07/03/2018  ? Gastroesophageal reflux disease without esophagitis   ? Heart murmur   ? Hypertension   ? Irritable bowel syndrome   ? 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 PONV (postoperative nausea and vomiting)   ? Unilateral primary osteoarthritis, 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  ? ? ?Past Surgical History:  ?Procedure Laterality Date  ? ABDOMINAL HYSTERECTOMY  1988  ? secondary to prolapse  ? BLADDER SUSPENSION    ? A-P with Hyst  ? INSERTION OF MESH  10/16/2018  ? Procedure: INSERTION OF MESH;  Surgeon: Ardis Hughs, MD;  [facility];  Service: Urology;;  ? [place]  ? ROBOTIC ASSISTED LAPAROSCOPIC SACROCOLPOPEXY N/A 10/16/2018  ? Procedure: XI ROBOTIC ASSISTED LAPAROSCOPIC SACROCOLPOPEXY;  Surgeon: Ardis Hughs, MD;  [facility];  Service: Urology;  Laterality: N/A;  ? TONSILLECTOMY    ? TOTAL HIP ARTHROPLASTY Right 11/04/2021  ? Procedure: RIGHT TOTAL HIP ARTHROPLASTY ANTERIOR APPROACH;  Surgeon: Mcarthur Rossetti, MD;  [facility];  Service: Orthopedics;  Laterality: Right;  ? ? ?There were no vitals filed for this visit. ? ? Subjective Assessment - 03/23/22 1445   ? ?  Subjective Patient reports that her shoulder still feels better, but her L bicep continues to be sore at times.   ? Diagnostic tests X ray shows GH and AC osteoarthritis.   ? Patient Stated Goals Decrease the pain in her shoulder, be able to dress and wash without pain.   ? Currently in Pain? Yes   ? Pain Score 4    ? Pain Location Arm   ? Pain Orientation Left;Anterior;Proximal   ? Pain Descriptors / Indicators Tightness;Sharp   ? Pain Type Chronic pain   ? Pain Onset More than a month ago   ? Pain Frequency Intermittent   ? Aggravating Factors  Pulling up pants   ? ?  ?  ? ?  ? ? ? ? ? ? ? ? ? ? ? ? ? ? ? ? ? ? ? ? [place] Adult PT Treatment/Exercise - 03/23/22 0001   ? ?  ? Shoulder Exercises: Standing  ? Flexion Strengthening;Both;10 reps;Weights   ? Shoulder Flexion Weight (lbs) 2   ? ABduction Strengthening;Both;10 reps   ? Shoulder ABduction Weight (lbs) 2   ? Extension Strengthening;Both;10 reps   ? Extension Weight (lbs) 2   ? Other Standing Exercises Shoulder stars facing wall against red Tband resistance, 10 reps each out and up. B shoulder flex against yellow Tband, 2 x 10 reps.   ?  ?  Shoulder Exercises: ROM/Strengthening  ? UBE (Upper Arm Bike) L1, 3 min forward/3 minutes back   ?  ? Vasopneumatic  ? Number Minutes Vasopneumatic  10 minutes   ? Vasopnuematic Location  Shoulder   ? Vasopneumatic Pressure Low   ? Vasopneumatic Temperature  34   ? ?  ?  ? ?  ? ? ? ? ? ? ? ? ? ? ? ? PT Short Term Goals - 03/07/22 1111   ? ?  ? PT SHORT TERM GOAL #1  ? Title I with inital HEP   ? Baseline Initiated   ? Time 4   ? Period Weeks   ? Status On-going   ? Target Date 03/30/22   ? ?  ?  ? ?  ? ? ? ? PT Long Term Goals - 03/02/22 1306   ? ?  ? PT LONG TERM GOAL #1  ? Title I with final HEP   ? Time 12   ? Period Weeks   ? Status New   ? Target Date 05/25/22   ?  ? PT LONG TERM GOAL #2  ? Title Patient will achieve full, functional AROM of L shoulder with pain <3/10   ? Baseline Up to 8/10   ? Time 12   ? Period Weeks    ? Status New   ? Target Date 05/25/22   ?  ? PT LONG TERM GOAL #3  ? Title Patient will achieve L shoulder strength of at least 4-/5 with pain < 3/10   ? Baseline 3+/5, limited by pain.   ? Time 12   ? Period Weeks   ? Status New   ? Target Date 05/25/22   ?  ? PT LONG TERM GOAL #4  ? Title Patient will be able to bath and dress herself with pain in L shoulder of <3/10   ? Baseline Up to 8/10 with pulling up pants, wiping herself, donning/doffing bra.   ? Time 12   ? Period Weeks   ? Status New   ? Target Date 05/25/22   ? ?  ?  ? ?  ? ? ? ? ? ? ? ? Plan - 03/23/22 1519   ? ? Clinical Impression Statement Patient reports no problems, but her bicep continues be painful at times, mostly in the tendon. Treatment started with STM/deep cross friction to L bicep tendon and supraspinatus tendon, joint mobs for GH depression, posterior movmeent, F/B stretching. Patient then continued strengthening for both L biceps and also scapular stability.   ? Personal Factors and Comorbidities Age;Comorbidity 1   ? Comorbidities OA   ? Examination-Activity Limitations Bathing;Reach Overhead;Bed Mobility;Self Feeding;Sleep;Toileting;Hygiene/Grooming;Dressing   ? Examination-Participation Restrictions Cleaning;Yard Work;Laundry;Meal Prep   ? Stability/Clinical Decision Making Evolving/Moderate complexity   ? Rehab Potential Good   ? PT Frequency 2x / week   ? PT Duration Other (comment)   ? PT Treatment/Interventions ADLs/Self Care Home Management;Iontophoresis '4mg'$ /ml Dexamethasone;Moist Heat;Ultrasound;Electrical Stimulation;Cryotherapy;Manual techniques;Dry needling;Passive range of motion;Functional mobility training;Therapeutic activities;Therapeutic exercise;Patient/family education;Vasopneumatic Device   ? PT Next Visit Plan focus on strengthening, LUE still very weak. update HEP   ? PT Home Exercise Plan [email or web address]   ? Consulted and Agree with Plan of Care Patient   ? ?  ?  ? ?  ? ? ?Patient will benefit from skilled therapeutic  intervention in order to improve the following deficits and impairments:  Decreased range of motion, Increased fascial restricitons, Pain, Decreased strength, Increased edema, Postural dysfunction, Decreased  mobility, Improper body mechanics, Impaired flexibility ? ?Visit Diagnosis: ?Chronic left shoulder pain ? ?Localized edema ? ?Muscle weakness (generalized) ? ? ? ? ?Problem List ?Patient Active Problem List  ? Diagnosis Date Noted  ? Diverticulosis 11/16/2021  ? Dyslipidemia 11/16/2021  ? History of Clostridium difficile infection 11/16/2021  ? 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 DDD (degenerative disc disease), lumbar 09/28/2019  ? Aortic atherosclerosis ([place]) 09/28/2019  ? OSA (obstructive sleep apnea) 01/27/2019  ? Pulmonary nodule 01/27/2019  ? Allergic rhinitis 11/06/2018  ? Upper airway cough syndrome 09/24/2018  ? Essential hypertension 06/03/2018  ? Anxiety and depression 01/17/2017  ? Vitamin D deficiency 01/17/2017  ? Osteopenia 04/08/2015  ? Irritable bowel syndrome (IBS) 02/03/2008  ? ? ?Marcelina Morel, DPT ?03/23/2022, 3:23 PM ? ?[place] [address] ?[address] ?[place], [place], [address] ?Phone: [phone]   Fax:  [phone] ? ?Name: VINCENZA DAIL ?MRN: [id number] ?Date of Birth: 09/29/1941 ? ? ? ?

## 2022-03-28 ENCOUNTER — Ambulatory Visit: Payer: Medicare Other | Attending: Family Medicine | Admitting: Physical Therapy

## 2022-03-28 ENCOUNTER — Encounter: Payer: Self-pay | Admitting: Physical Therapy

## 2022-03-28 DIAGNOSIS — G8929 Other chronic pain: Secondary | ICD-10-CM | POA: Diagnosis not present

## 2022-03-28 DIAGNOSIS — M6281 Muscle weakness (generalized): Secondary | ICD-10-CM | POA: Insufficient documentation

## 2022-03-28 DIAGNOSIS — M25512 Pain in left shoulder: Secondary | ICD-10-CM | POA: Diagnosis not present

## 2022-03-28 DIAGNOSIS — R600 Localized edema: Secondary | ICD-10-CM | POA: Insufficient documentation

## 2022-03-28 NOTE — Patient Instructions (Signed)

## 2022-03-28 NOTE — Therapy (Signed)
[place] [facility] ?[place], [place], [address] ?Phone: [phone]   Fax:  [phone] ? ?Physical Therapy Treatment ? ?Patient Details  ?Name: Gloria Werner ?MRN: [id number] ?Date of Birth: 1941-04-17 ?Referring Provider (PT): Dr Arlester Marker ? ? ?Encounter Date: 03/28/2022 ? ? PT End of Session - 03/28/22 1101   ? ? Visit Number 7   ? Date for PT Re-Evaluation 05/25/22   ? PT Start Time 1101   ? PT Stop Time 1159   10 min heat at end  ? PT Time Calculation (min) 58 min   ? Activity Tolerance Patient tolerated treatment well   ? Behavior During Therapy [facility] for tasks assessed/performed   ? ?  ?  ? ?  ? ? ?Past Medical History:  ?Diagnosis Date  ? Allergic rhinitis due to pollen   ? Cancer [facility])   ? Cataract   ? NS OU  ? Complication of anesthesia   ? Cough   ? De Quervain's syndrome (tenosynovitis) 07/03/2018  ? Gastroesophageal reflux disease without esophagitis   ? Heart murmur   ? Hypertension   ? Irritable bowel syndrome   ? 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 PONV (postoperative nausea and vomiting)   ? Unilateral primary osteoarthritis, 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  ? ? ?Past Surgical History:  ?Procedure Laterality Date  ? ABDOMINAL HYSTERECTOMY  1988  ? secondary to prolapse  ? BLADDER SUSPENSION    ? A-P with Hyst  ? INSERTION OF MESH  10/16/2018  ? Procedure: INSERTION OF MESH;  Surgeon: Ardis Hughs, MD;  [facility]: [facility];  Service: Urology;;  ? [place]  ? ROBOTIC ASSISTED LAPAROSCOPIC SACROCOLPOPEXY N/A 10/16/2018  ? Procedure: XI ROBOTIC ASSISTED LAPAROSCOPIC SACROCOLPOPEXY;  Surgeon: Ardis Hughs, MD;  [facility];  Service: Urology;  Laterality: N/A;  ? TONSILLECTOMY    ? TOTAL HIP ARTHROPLASTY Right 11/04/2021  ? Procedure: RIGHT TOTAL HIP ARTHROPLASTY ANTERIOR APPROACH;  Surgeon: Mcarthur Rossetti, MD;  [facility];  Service: Orthopedics;  Laterality: Right;  ? ? ?There were no vitals filed for this  visit. ? ? Subjective Assessment - 03/28/22 1103   ? ? Subjective Patient continuing to report left biceps pain. Felt it when reaching back today when toileting. She had a really bad cramp. It hurts the most when she has to throw her sheets up when making her bed.   ? Patient Stated Goals Decrease the pain in her shoulder, be able to dress and wash without pain.   ? Pain Score 3    ? Pain Location Arm   ? Pain Orientation Left   ? Pain Descriptors / Indicators Cramping   ? Pain Type Chronic pain   ? ?  ?  ? ?  ? ? ? ? ? ? ? ? ? ? ? ? ? ? ? ? ? ? ? ? [place] Adult PT Treatment/Exercise - 03/28/22 0001   ? ?  ? Shoulder Exercises: Seated  ? Other Seated Exercises UBE L3 3 min forward/3 min backward   ?  ? Shoulder Exercises: Standing  ? Flexion Strengthening;Both;10 reps;Weights   ? Shoulder Flexion Weight (lbs) 2   ? ABduction Strengthening;Both;10 reps   ? Shoulder ABduction Weight (lbs) 2   ? Extension Strengthening;Both;10 reps   ? Extension Weight (lbs) 2   ? Other Standing Exercises scaption 2# x 10   ?  ? Shoulder Exercises: Stretch  ? Other  Shoulder Stretches bicep stretch on wall 2x30 seconds L UE   ?  ? Shoulder Exercises: Power Tower  ? Extension 20 reps   ? Extension Limitations 10#   ? Row 20 reps   ? Row Limitations 20#   ? Other Power Engineer, water lat pulls 20# x10 2 sets; tricep pull downs 2x10 20#   ? Other [employer] civil engineer curl to [employer] press 3# 1x10   ?  ? Manual Therapy  ? Manual Therapy Soft tissue mobilization   ? Manual therapy comments Skilled palpation and monitoring of soft tissues during DN   ? Soft tissue mobilization to deltoid, biceps and lats   ? ?  ?  ? ?  ? ? ? Trigger Point Dry Needling - 03/28/22 0001   ? ? Consent Given? Yes   ? Education Handout Provided Yes   ? Muscles Treated Upper Quadrant Deltoid;Biceps;Latissimus dorsi;Subscapularis   ? Dry Needling Comments left   ? Subscapularis Response Palpable increased muscle length;Twitch response elicited   ? Deltoid Response  Twitch response elicited;Palpable increased muscle length   ? Latissimus dorsi Response Palpable increased muscle length;Twitch response elicited   ? Biceps Response Twitch response elicited;Palpable increased muscle length   ? ?  ?  ? ?  ? ? ? ? ? ? ? ? PT Education - 03/28/22 1153   ? ? Education Details DN Education and aftercare   ? Person(s) Educated Patient   ? Methods Explanation;Demonstration;Handout   ? Comprehension Verbalized understanding;Returned demonstration   ? ?  ?  ? ?  ? ? ? PT Short Term Goals - 03/07/22 1111   ? ?  ? PT SHORT TERM GOAL #1  ? Title I with inital HEP   ? Baseline Initiated   ? Time 4   ? Period Weeks   ? Status On-going   ? Target Date 03/30/22   ? ?  ?  ? ?  ? ? ? ? PT Long Term Goals - 03/02/22 1306   ? ?  ? PT LONG TERM GOAL #1  ? Title I with final HEP   ? Time 12   ? Period Weeks   ? Status New   ? Target Date 05/25/22   ?  ? PT LONG TERM GOAL #2  ? Title Patient will achieve full, functional AROM of L shoulder with pain <3/10   ? Baseline Up to 8/10   ? Time 12   ? Period Weeks   ? Status New   ? Target Date 05/25/22   ?  ? PT LONG TERM GOAL #3  ? Title Patient will achieve L shoulder strength of at least 4-/5 with pain < 3/10   ? Baseline 3+/5, limited by pain.   ? Time 12   ? Period Weeks   ? Status New   ? Target Date 05/25/22   ?  ? PT LONG TERM GOAL #4  ? Title Patient will be able to bath and dress herself with pain in L shoulder of <3/10   ? Baseline Up to 8/10 with pulling up pants, wiping herself, donning/doffing bra.   ? Time 12   ? Period Weeks   ? Status New   ? Target Date 05/25/22   ? ?  ?  ? ?  ? ? ? ? ? ? ? ? Plan - 03/28/22 1157   ? ? Clinical Impression Statement Patient reporting improvements but still feeling pain in left biceps with reaching  backward and with resistance when making the bed. Initial trial of DN went very well with good responses in left deltoid especially. Patient demonstrated improved left shoulder AROM after DN. Still restricted with  reaching behind her back and somewhat behind her head as well. She will benefit from continued skilled PT to meet her unmet goals.   ? PT Frequency 2x / week   ? PT Treatment/Interventions ADLs/Self Care Home Management;Iontophoresis '4mg'$ /ml Dexamethasone;Moist Heat;Ultrasound;Electrical Stimulation;Cryotherapy;Manual techniques;Dry needling;Passive range of motion;Functional mobility training;Therapeutic activities;Therapeutic exercise;Patient/family education;Vasopneumatic Device   ? PT Next Visit Plan assess DN, focus on strengthening, LUE still very weak. update HEP   ? PT Home Exercise Plan [email or web address]   ? Consulted and Agree with Plan of Care Patient   ? ?  ?  ? ?  ? ? ?Patient will benefit from skilled therapeutic intervention in order to improve the following deficits and impairments:  Decreased range of motion, Increased fascial restricitons, Pain, Decreased strength, Increased edema, Postural dysfunction, Decreased mobility, Improper body mechanics, Impaired flexibility ? ?Visit Diagnosis: ?Chronic left shoulder pain ? ?Muscle weakness (generalized) ? ?Localized edema ? ? ? ? ?Problem List ?Patient Active Problem List  ? Diagnosis Date Noted  ? Diverticulosis 11/16/2021  ? Dyslipidemia 11/16/2021  ? History of Clostridium difficile infection 11/16/2021  ? 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 DDD (degenerative disc disease), lumbar 09/28/2019  ? Aortic atherosclerosis ([place]) 09/28/2019  ? OSA (obstructive sleep apnea) 01/27/2019  ? Pulmonary nodule 01/27/2019  ? Allergic rhinitis 11/06/2018  ? Upper airway cough syndrome 09/24/2018  ? Essential hypertension 06/03/2018  ? Anxiety and depression 01/17/2017  ? Vitamin D deficiency 01/17/2017  ? Osteopenia 04/08/2015  ? Irritable bowel syndrome (IBS) 02/03/2008  ? ? ?Madelyn Flavors, PT ?03/28/2022, 12:03 PM ? ?[place] ?[address] ?[place], [place], [address] ?Phone: [phone]   Fax:   [phone] ? ?Name: Gloria Werner ?MRN: [id number] ?Date of Birth: Jan 14, 1941 ? ? ? ?

## 2022-03-30 ENCOUNTER — Ambulatory Visit: Payer: Medicare Other | Admitting: Physical Therapy

## 2022-03-30 ENCOUNTER — Encounter: Payer: Self-pay | Admitting: Physical Therapy

## 2022-03-30 DIAGNOSIS — M6281 Muscle weakness (generalized): Secondary | ICD-10-CM | POA: Diagnosis not present

## 2022-03-30 DIAGNOSIS — G8929 Other chronic pain: Secondary | ICD-10-CM | POA: Diagnosis not present

## 2022-03-30 DIAGNOSIS — M25512 Pain in left shoulder: Secondary | ICD-10-CM | POA: Diagnosis not present

## 2022-03-30 DIAGNOSIS — R600 Localized edema: Secondary | ICD-10-CM | POA: Diagnosis not present

## 2022-03-30 NOTE — Therapy (Signed)
?[facility] ?[address] ?[place], [place], [address] ?Phone: [phone]   Fax:  [phone] ? ?Physical Therapy Treatment ? ?Patient Details  ?Name: Gloria Werner ?MRN: [id number] ?Date of Birth: 1941/11/29 ?Referring Provider (PT): Dr Gloria Werner ? ? ?Encounter Date: 03/30/2022 ? ? PT End of Session - 03/30/22 1149   ? ? Visit Number 8   ? Date for PT Re-Evaluation 05/25/22   ? PT Start Time 1103   ? PT Stop Time 4580   ? PT Time Calculation (min) 39 min   ? Activity Tolerance Patient tolerated treatment well   ? Behavior During Therapy [facility] for tasks assessed/performed   ? ?  ?  ? ?  ? ? ?Past Medical History:  ?Diagnosis Date  ? Allergic rhinitis due to pollen   ? Cancer Gloria Werner)   ? Cataract   ? NS OU  ? Complication of anesthesia   ? Cough   ? De Quervain's syndrome (tenosynovitis) 07/03/2018  ? Gastroesophageal reflux disease without esophagitis   ? Heart murmur   ? Hypertension   ? Irritable bowel syndrome   ? 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 PONV (postoperative nausea and vomiting)   ? Unilateral primary osteoarthritis, 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  ? ? ?Past Surgical History:  ?Procedure Laterality Date  ? ABDOMINAL HYSTERECTOMY  1988  ? secondary to prolapse  ? BLADDER SUSPENSION    ? A-P with Hyst  ? INSERTION OF MESH  10/16/2018  ? Procedure: INSERTION OF MESH;  Surgeon: Gloria Hughs, MD;  [facility];  Service: Urology;;  ? [place]  ? ROBOTIC ASSISTED LAPAROSCOPIC SACROCOLPOPEXY N/A 10/16/2018  ? Procedure: XI ROBOTIC ASSISTED LAPAROSCOPIC SACROCOLPOPEXY;  Surgeon: Gloria Hughs, MD;  [facility];  Service: Urology;  Laterality: N/A;  ? TONSILLECTOMY    ? TOTAL HIP ARTHROPLASTY Right 11/04/2021  ? Procedure: RIGHT TOTAL HIP ARTHROPLASTY ANTERIOR APPROACH;  Surgeon: Gloria Rossetti, MD;  [facility];  Service: Orthopedics;  Laterality: Right;  ? ? ?There were no vitals filed for this visit. ? ?  Subjective Assessment - 03/30/22 1111   ? ? Subjective "can you work a miracle for me today?" still having trouble doing my bra, rotating my arm in hurts quite a bit   ? Pertinent History R THR 11/22   ? Patient Stated Goals Decrease the pain in her shoulder, be able to dress and wash without pain.   ? Currently in Pain? Yes   ? Pain Score 6    ? Pain Location Arm   ? Pain Orientation Left   ? Pain Descriptors / Indicators Cramping   ? Pain Type Chronic pain   ? ?  ?  ? ?  ? ? ? ? ? ? ? ? ? ? ? ? ? ? ? ? ? ? ? ? [place] Adult PT Treatment/Exercise - 03/30/22 0001   ? ?  ? Shoulder Exercises: Seated  ? Other Seated Exercises UBE L3 3 min forward/3 min backward   ?  ? Shoulder Exercises: Sidelying  ? Other Sidelying Exercises sleeper stretch L UE 10x10 second holds   ? Other Sidelying Exercises sidelying ER 0# 1x20   ?  ? Shoulder Exercises: Standing  ? Flexion Strengthening;Left;15 reps   ? Shoulder Flexion Weight (lbs) 2   ? ABduction Strengthening;Left;15 reps   ? Shoulder ABduction Weight (lbs) 2   ? Other Standing Exercises shoulder extensoin stretch 2#wate  bar 1x10; IR stretch in standing 10x10 seconds using wate bar   ? Other Standing Exercises wall pushups 1x10   ?  ? Shoulder Exercises: Stretch  ? Other Shoulder Stretches bicep stretch on wall 2x30 seconds L UE   ? Other Shoulder Stretches shoulder ER stretch 10x10 seconds   ? ?  ?  ? ?  ? ? ? ? ? ? ? ? ? ? PT Education - 03/30/22 1149   ? ? Education Details HEP updates, likely role of soft tissues in remaining pain and limitations   ? Person(s) Educated Patient   ? Methods Explanation;Handout;Demonstration   ? Comprehension Verbalized understanding;Returned demonstration   ? ?  ?  ? ?  ? ? ? PT Short Term Goals - 03/07/22 1111   ? ?  ? PT SHORT TERM GOAL #1  ? Title I with inital HEP   ? Baseline Initiated   ? Time 4   ? Period Weeks   ? Status On-going   ? Target Date 03/30/22   ? ?  ?  ? ?  ? ? ? ? PT Long Term Goals - 03/02/22 1306   ? ?  ? PT LONG TERM  GOAL #1  ? Title I with final HEP   ? Time 12   ? Period Weeks   ? Status New   ? Target Date 05/25/22   ?  ? PT LONG TERM GOAL #2  ? Title Patient will achieve full, functional AROM of L shoulder with pain <3/10   ? Baseline Up to 8/10   ? Time 12   ? Period Weeks   ? Status New   ? Target Date 05/25/22   ?  ? PT LONG TERM GOAL #3  ? Title Patient will achieve L shoulder strength of at least 4-/5 with pain < 3/10   ? Baseline 3+/5, limited by pain.   ? Time 12   ? Period Weeks   ? Status New   ? Target Date 05/25/22   ?  ? PT LONG TERM GOAL #4  ? Title Patient will be able to bath and dress herself with pain in L shoulder of <3/10   ? Baseline Up to 8/10 with pulling up pants, wiping herself, donning/doffing bra.   ? Time 12   ? Period Weeks   ? Status New   ? Target Date 05/25/22   ? ?  ?  ? ?  ? ? ? ? ? ? ? ? Plan - 03/30/22 1149   ? ? Clinical Impression Statement Gloria Werner arrives today still having quite a bit of pain in her biceps mm and anterior upper arm, dry needling didn?t help too much and she has a hard time getting a bra on. Warmed up on UBE, then focused a bit on stretching bicep and internal rotation stretching, otherwise continued working on functional strengthening. Added sleeper stretch and more shoulder strengthening to HEP. Will continue efforts.   ? Personal Factors and Comorbidities Age;Comorbidity 1   ? Comorbidities OA   ? Examination-Activity Limitations Bathing;Reach Overhead;Bed Mobility;Self Feeding;Sleep;Toileting;Hygiene/Grooming;Dressing   ? Examination-Participation Restrictions Cleaning;Yard Work;Laundry;Meal Prep   ? Stability/Clinical Decision Making Evolving/Moderate complexity   ? Clinical Decision Making Moderate   ? Rehab Potential Good   ? PT Frequency 2x / week   ? PT Duration Other (comment)   ? PT Treatment/Interventions ADLs/Self Care Home Management;Iontophoresis '4mg'$ /ml Dexamethasone;Moist Heat;Ultrasound;Electrical Stimulation;Cryotherapy;Manual techniques;Dry  needling;Passive range of motion;Functional mobility training;Therapeutic activities;Therapeutic exercise;Patient/family education;Vasopneumatic Device   ?  PT Next Visit Plan assess DN, focus on strengthening, LUE still very weak   ? PT Home Exercise Plan [email or web address]   ? Consulted and Agree with Plan of Care Patient   ? ?  ?  ? ?  ? ? ?Patient will benefit from skilled therapeutic intervention in order to improve the following deficits and impairments:  Decreased range of motion, Increased fascial restricitons, Pain, Decreased strength, Increased edema, Postural dysfunction, Decreased mobility, Improper body mechanics, Impaired flexibility ? ?Visit Diagnosis: ?Chronic left shoulder pain ? ?Muscle weakness (generalized) ? ?Localized edema ? ? ? ? ?Problem List ?Patient Active Problem List  ? Diagnosis Date Noted  ? Diverticulosis 11/16/2021  ? Dyslipidemia 11/16/2021  ? History of Clostridium difficile infection 11/16/2021  ? 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 DDD (degenerative disc disease), lumbar 09/28/2019  ? Aortic atherosclerosis ([place]) 09/28/2019  ? OSA (obstructive sleep apnea) 01/27/2019  ? Pulmonary nodule 01/27/2019  ? Allergic rhinitis 11/06/2018  ? Upper airway cough syndrome 09/24/2018  ? Essential hypertension 06/03/2018  ? Anxiety and depression 01/17/2017  ? Vitamin D deficiency 01/17/2017  ? Osteopenia 04/08/2015  ? Irritable bowel syndrome (IBS) 02/03/2008  ? ?Klint Lezcano U PT, DPT, PN2  ? ?Supplemental Physical Therapist ?[facility]  ? ? ? ? ? ?[facility] [address] ?[place], [place], [address] ?Phone: [phone]   Fax:  [phone] ? ?Name: Gloria Werner ?MRN: [id number] ?Date of Birth: 1941/06/10 ? ? ? ?

## 2022-04-03 ENCOUNTER — Ambulatory Visit: Payer: Medicare Other | Admitting: Physical Therapy

## 2022-04-03 ENCOUNTER — Encounter: Payer: Self-pay | Admitting: Physical Therapy

## 2022-04-03 DIAGNOSIS — M6281 Muscle weakness (generalized): Secondary | ICD-10-CM | POA: Diagnosis not present

## 2022-04-03 DIAGNOSIS — M25512 Pain in left shoulder: Secondary | ICD-10-CM | POA: Diagnosis not present

## 2022-04-03 DIAGNOSIS — R600 Localized edema: Secondary | ICD-10-CM | POA: Diagnosis not present

## 2022-04-03 DIAGNOSIS — G8929 Other chronic pain: Secondary | ICD-10-CM | POA: Diagnosis not present

## 2022-04-03 NOTE — Therapy (Signed)
[facility] ?[address] ?[place], [place], [address] ?Phone: [phone]   Fax:  [phone] ? ?Physical Therapy Treatment ? ?Patient Details  ?Name: Gloria Werner ?MRN: [id number] ?Date of Birth: 05-31-1941 ?Referring Provider (PT): Dr Arlester Marker ? ? ?Encounter Date: 04/03/2022 ? ? PT End of Session - 04/03/22 1059   ? ? Visit Number 9   ? Date for PT Re-Evaluation 05/25/22   ? PT Start Time 1011   ? PT Stop Time 1105   ? PT Time Calculation (min) 54 min   ? Activity Tolerance Patient tolerated treatment well   ? Behavior During Therapy [facility] for tasks assessed/performed   ? ?  ?  ? ?  ? ? ?Past Medical History:  ?Diagnosis Date  ? Allergic rhinitis due to pollen   ? Cancer [facility])   ? Cataract   ? NS OU  ? Complication of anesthesia   ? Cough   ? De Quervain's syndrome (tenosynovitis) 07/03/2018  ? Gastroesophageal reflux disease without esophagitis   ? Heart murmur   ? Hypertension   ? Irritable bowel syndrome   ? 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 PONV (postoperative nausea and vomiting)   ? Unilateral primary osteoarthritis, 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  ? ? ?Past Surgical History:  ?Procedure Laterality Date  ? ABDOMINAL HYSTERECTOMY  1988  ? secondary to prolapse  ? BLADDER SUSPENSION    ? A-P with Hyst  ? INSERTION OF MESH  10/16/2018  ? Procedure: INSERTION OF MESH;  Surgeon: Ardis Hughs, MD;  [facility]: [facility];  Service: Urology;;  ? [place]  ? ROBOTIC ASSISTED LAPAROSCOPIC SACROCOLPOPEXY N/A 10/16/2018  ? Procedure: XI ROBOTIC ASSISTED LAPAROSCOPIC SACROCOLPOPEXY;  Surgeon: Ardis Hughs, MD;  [facility];  Service: Urology;  Laterality: N/A;  ? TONSILLECTOMY    ? TOTAL HIP ARTHROPLASTY Right 11/04/2021  ? Procedure: RIGHT TOTAL HIP ARTHROPLASTY ANTERIOR APPROACH;  Surgeon: Mcarthur Rossetti, MD;  [facility];  Service: Orthopedics;  Laterality: Right;  ? ? ?There were no vitals filed for this visit. ? ?  Subjective Assessment - 04/03/22 1016   ? ? Subjective I think when I started I had a continuous hurt, that is less and pain is mostly iwth reaching out and back   ? Currently in Pain? Yes   ? Pain Score 5    ? Pain Location Arm   ? Pain Orientation Left;Upper;Lateral;Anterior   ? Pain Descriptors / Indicators Cramping;Tightness;Spasm   ? Aggravating Factors  reaching back and out   ? ?  ?  ? ?  ? ? ? ? ? [facility] PT Assessment - 04/03/22 0001   ? ?  ? AROM  ? AROM Assessment Site Shoulder   ? Right/Left Shoulder Left   ? Left Shoulder Flexion 130 Degrees   ? Left Shoulder ABduction 130 Degrees   ? Left Shoulder Internal Rotation 35 Degrees   ? Left Shoulder External Rotation 65 Degrees   ? ?  ?  ? ?  ? ? ? ? ? ? ? ? ? ? ? ? ? ? ? ? [place] Adult PT Treatment/Exercise - 04/03/22 0001   ? ?  ? Shoulder Exercises: ROM/Strengthening  ? UBE (Upper Arm Bike) L2, 3 min forward/3 minutes back   ?  ? Modalities  ? Modalities Moist Heat   ?  ? Moist Heat Therapy  ? Number Minutes Moist Heat 10 Minutes   ?  Moist Heat Location Shoulder   after the treatment to help with soreness  ?  ? Manual Therapy  ? Joint Mobilization left GH all motions grade II/III   ? Soft tissue mobilization to deltoid, biceps   ? Passive ROM all GH joint motions with some distraction to end range   ? Neural Stretch L brachial plexus., median nerve   ? ?  ?  ? ?  ? ? ? Trigger Point Dry Needling - 04/03/22 0001   ? ? Consent Given? Yes   ? Deltoid Response Twitch response elicited;Palpable increased muscle length   ? Biceps Response Twitch response elicited;Palpable increased muscle length   ? ?  ?  ? ?  ? ? ? ? ? ? ? ? ? ? PT Werner Term Goals - 03/07/22 1111   ? ?  ? PT Werner TERM GOAL #1  ? Title I with inital HEP   ? Baseline Initiated   ? Time 4   ? Period Weeks   ? Status On-going   ? Target Date 03/30/22   ? ?  ?  ? ?  ? ? ? ? PT Long Term Goals - 04/03/22 1109   ? ?  ? PT LONG TERM GOAL #1  ? Title I with final HEP   ? Status On-going   ?  ? PT LONG  TERM GOAL #2  ? Title Patient will achieve full, functional AROM of L shoulder with pain <3/10   ? Status On-going   ?  ? PT LONG TERM GOAL #3  ? Title Patient will achieve L shoulder strength of at least 4-/5 with pain < 3/10   ? Status On-going   ? ?  ?  ? ?  ? ? ? ? ? ? ? ? Plan - 04/03/22 1059   ? ? Clinical Impression Statement Patient reports overall less pain but still hurting when she reaches out and back, I took measurements today and she is very limited with IR and ER.  Reports difficulty getting arm behind her back.  She does have some tightness and tenderness in the biceps and the deltoid area, I focused on manual therapy today and then finished with heat to limit soreness   ? PT Next Visit Plan see how she does and adjust accordingly   ? Consulted and Agree with Plan of Care Patient   ? ?  ?  ? ?  ? ? ?Patient will benefit from skilled therapeutic intervention in order to improve the following deficits and impairments:  Decreased range of motion, Increased fascial restricitons, Pain, Decreased strength, Increased edema, Postural dysfunction, Decreased mobility, Improper body mechanics, Impaired flexibility ? ?Visit Diagnosis: ?Chronic left shoulder pain ? ?Muscle weakness (generalized) ? ?Localized edema ? ? ? ? ?Problem List ?Patient Active Problem List  ? Diagnosis Date Noted  ? Diverticulosis 11/16/2021  ? Dyslipidemia 11/16/2021  ? History of Clostridium difficile infection 11/16/2021  ? 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 DDD (degenerative disc disease), lumbar 09/28/2019  ? Aortic atherosclerosis ([place]) 09/28/2019  ? OSA (obstructive sleep apnea) 01/27/2019  ? Pulmonary nodule 01/27/2019  ? Allergic rhinitis 11/06/2018  ? Upper airway cough syndrome 09/24/2018  ? Essential hypertension 06/03/2018  ? Anxiety and depression 01/17/2017  ? Vitamin D deficiency 01/17/2017  ? Osteopenia 04/08/2015  ? Irritable bowel syndrome (IBS) 02/03/2008  ? ? Sumner Boast, PT ?04/03/2022,  11:09 AM ? ?[facility] ?[address] ?[address] ?[place], [place], [address] ?Phone: [phone]   Fax:  [phone] ? ?Name: Gloria Werner ?MRN: [id number] ?Date of Birth: 10-May-1941 ? ? ? ?

## 2022-04-07 ENCOUNTER — Ambulatory Visit (INDEPENDENT_AMBULATORY_CARE_PROVIDER_SITE_OTHER): Payer: Medicare Other | Admitting: Family Medicine

## 2022-04-07 ENCOUNTER — Ambulatory Visit: Payer: Medicare Other | Admitting: Physical Therapy

## 2022-04-07 VITALS — BP 136/82 | HR 77 | Temp 97.7°F | Ht 59.5 in | Wt 152.2 lb

## 2022-04-07 DIAGNOSIS — R600 Localized edema: Secondary | ICD-10-CM | POA: Diagnosis not present

## 2022-04-07 DIAGNOSIS — G8929 Other chronic pain: Secondary | ICD-10-CM

## 2022-04-07 DIAGNOSIS — M25512 Pain in left shoulder: Secondary | ICD-10-CM

## 2022-04-07 DIAGNOSIS — M6281 Muscle weakness (generalized): Secondary | ICD-10-CM

## 2022-04-07 MED ORDER — MELOXICAM 7.5 MG PO TABS: 7.5000 mg | ORAL_TABLET | Freq: Every day | ORAL | 1 refills | Status: DC

## 2022-04-07 NOTE — Progress Notes (Signed)
?[facility] ?[facility] ?[place] ?[place] [address] ?Dept: [phone] ?Dept Fax: [phone] ? ?Office Visit ? ?Subjective:  ? ? Patient ID: Gloria Werner, female    DOB: January 26, 1941, 81 y.o..   MRN: [id number] ? ?Chief Complaint  ?Patient presents with  ? Acute Visit  ?  C/o having LT shoulder/arm pain.  She has been going to PT.  Has only been taking Tylenol.   ? ? ?History of Present Illness: ? ?Patient is in today for reassessment of her left shoulder. I had seen Gloria Werner in late February. She noted at her last follow-up with Dr. Ninfa Linden concerning her hip, she had mentioned to him issues she was having with left shoulder pain starting around mid-December. He gave her a steroid injection into the joint. She did not find that this helped at all. She notes pain in the left shoulder, radiating down into the upper arm, laterally. She was having trouble with lifting the arm above shoulder level and reaching behind her back. This has been interfering with ADLs. She is also finding it too painful to sleep on that side. ? ?I had referred Gloria Werner to PT. She has not found this to bring about significant improvement. She is no longer on meloxicam, which was originally prescribed for her hip. ? ?Past Medical History: ?Patient Active Problem List  ? Diagnosis Date Noted  ? Diverticulosis 11/16/2021  ? Dyslipidemia 11/16/2021  ? History of Clostridium difficile infection 11/16/2021  ? 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 DDD (degenerative disc disease), lumbar 09/28/2019  ? Aortic atherosclerosis ([place]) 09/28/2019  ? OSA (obstructive sleep apnea) 01/27/2019  ? Pulmonary nodule 01/27/2019  ? Allergic rhinitis 11/06/2018  ? Upper airway cough syndrome 09/24/2018  ? Essential hypertension 06/03/2018  ? Anxiety and depression 01/17/2017  ? Vitamin D deficiency 01/17/2017  ? Osteopenia 04/08/2015  ? Irritable bowel syndrome (IBS)  02/03/2008  ? ?Past Surgical History:  ?Procedure Laterality Date  ? ABDOMINAL HYSTERECTOMY  1988  ? secondary to prolapse  ? BLADDER SUSPENSION    ? A-P with Hyst  ? INSERTION OF MESH  10/16/2018  ? Procedure: INSERTION OF MESH;  Surgeon: Ardis Hughs, MD;  Location: [facility];  Service: Urology;;  ? [place]  ? ROBOTIC ASSISTED LAPAROSCOPIC SACROCOLPOPEXY N/A 10/16/2018  ? Procedure: XI ROBOTIC ASSISTED LAPAROSCOPIC SACROCOLPOPEXY;  Surgeon: Ardis Hughs, MD;  Location: [facility];  Service: Urology;  Laterality: N/A;  ? TONSILLECTOMY    ? TOTAL HIP ARTHROPLASTY Right 11/04/2021  ? Procedure: RIGHT TOTAL HIP ARTHROPLASTY ANTERIOR APPROACH;  Surgeon: Mcarthur Rossetti, MD;  Location: [facility];  Service: Orthopedics;  Laterality: Right;  ? ?Family History  ?Problem Relation Age of Onset  ? Emphysema Mother   ? Lymphoma Mother   ? Asthma Mother   ? Cancer Father 30  ?     lung cancer  ? Coronary artery disease Brother   ? Cancer Brother   ?     breast cancer  ? Breast cancer Brother   ? Cancer Brother   ?     lung cancer  ? Diabetes Neg Hx   ? Hypertension Neg Hx   ? Colon cancer Neg Hx   ? Esophageal cancer Neg Hx   ? Rectal cancer Neg Hx   ? Stomach cancer Neg Hx   ? ?Outpatient Medications Prior to Visit  ?Medication Sig Dispense Refill  ? acetaminophen (TYLENOL)  500 MG tablet Take 500-1,000 mg by mouth at bedtime as needed for moderate pain.    ? albuterol (VENTOLIN HFA) 108 (90 Base) MCG/ACT inhaler Inhale 1-2 puffs into the lungs every 6 (six) hours as needed. 8 g 2  ? amLODipine (NORVASC) 5 MG tablet Take 1 tablet (5 mg total) by mouth daily. 90 tablet 1  ? Biotin 10000 MCG TABS Take 10,000 mcg by mouth daily.    ? buPROPion (WELLBUTRIN) 75 MG tablet Take 1 tablet (75 mg total) by mouth daily. (Patient taking differently: Take 75 mg by mouth daily. Reports taking "as needed".) 90 tablet 3  ? calcium carbonate (OSCAL) 1500 (600 Ca) MG TABS tablet Take 600 mg of elemental calcium by  mouth daily with breakfast.    ? Carboxymethylcellul-Glycerin (LUBRICATING EYE DROPS OP) Place 1 drop into both eyes daily as needed (dry eyes).    ? Cyanocobalamin (VITAMIN B-12) 500 MCG LOZG Take 1,000 mcg by mouth daily.    ? dicyclomine (BENTYL) 10 MG capsule Take 1 capsule (10 mg total) by mouth 4 (four) times daily -  before meals and at bedtime. (Patient taking differently: Take 10 mg by mouth 4 (four) times daily as needed for spasms.) 120 capsule 0  ? polyethylene glycol (MIRALAX / GLYCOLAX) 17 g packet Take 17 g by mouth daily. Takes an extra scoop in the evening as needed for moderate-severe constipation.    ? pyridOXINE (VITAMIN B-6) 100 MG tablet Take 100 mg by mouth daily.    ? rosuvastatin (CRESTOR) 5 MG tablet Take 1 tablet (5 mg total) by mouth 3 (three) times a week. 36 tablet 1  ? saccharomyces boulardii (FLORASTOR) 250 MG capsule Take 2 capsules (500 mg total) by mouth 2 (two) times daily. (Patient taking differently: Take 250 mg by mouth daily.) 120 capsule 3  ? sodium chloride (OCEAN) 0.65 % SOLN nasal spray Place 1 spray into both nostrils 2 (two) times daily as needed for congestion.    ? vitamin C (ASCORBIC ACID) 500 MG tablet Take 500 mg by mouth daily.    ? Vitamin D, Ergocalciferol, (DRISDOL) 1.25 MG (50000 UNIT) CAPS capsule Take 1 capsule (50,000 Units total) by mouth every 30 (thirty) days. 3 capsule 3  ? meloxicam (MOBIC) 7.5 MG tablet Take 1 tablet (7.5 mg total) by mouth daily. 90 tablet 1  ? ?No facility-administered medications prior to visit.  ? ?Allergies  ?Allergen Reactions  ? Lexapro [Escitalopram] Diarrhea and Other (See Comments)  ?  headache  ? Ace Inhibitors Cough  ? Codeine Nausea And Vomiting  ?  ?Objective:  ? ?Today's Vitals  ? 04/07/22 1547  ?BP: 136/82  ?Pulse: 77  ?Temp: 97.7 ?F (36.5 ?C)  ?TempSrc: Temporal  ?SpO2: 98%  ?Weight: 152 lb 3.2 oz (69 kg)  ?Height: 4' 11.5" (1.511 m)  ? ?Body mass index is 30.23 kg/m?.  ? ?General: Well developed, well nourished. No  acute distress. ?Extremities: Limited ROM of the left shoulder. No crepitance noted. Mild increased pain with resisted  ? rotator cuff testing. No pain on palpating the lateral joint line. Mild tenderness over the [facility] joint.  ? Good strength throughout.  ?Psych: Alert and oriented. Normal mood and affect. ? ?There are no preventive care reminders to display for this patient. ?   ?Assessment & Plan:  ? ?1. Chronic left shoulder pain ?I will have Ms. Dianah Field restart meloxicam. I will refer her to an orthopedist that specializes in shoulder joint issues. ? ?- Ambulatory  referral to Orthopedic Surgery ?- meloxicam (MOBIC) 7.5 MG tablet; Take 1 tablet (7.5 mg total) by mouth daily.  Dispense: 90 tablet; Refill: 1 ? ? ?Return if symptoms worsen or fail to improve.  ? ?Haydee Salter, MD ?

## 2022-04-07 NOTE — Therapy (Signed)
[facility] ?[facility] ?[place], [place], [address] ?Phone: [phone]   Fax:  [phone] ? ?Physical Therapy Treatment ? ?Patient Details  ?Name: Gloria Werner ?MRN: [id number] ?Date of Birth: [birth year] ?Referring Provider (PT): Dr Arlester Marker ? ? ?Encounter Date: 04/07/2022 ? ? PT End of Session - 04/07/22 0911   ? ? Visit Number 10   ? Date for PT Re-Evaluation 05/25/22   ? PT Start Time 0840   ? PT Stop Time 1941   ? PT Time Calculation (min) 40 min   ? ?  ?  ? ?  ? ? ?Past Medical History:  ?Diagnosis Date  ? Allergic rhinitis due to pollen   ? Cancer [facility])   ? Cataract   ? NS OU  ? Complication of anesthesia   ? Cough   ? De Quervain's syndrome (tenosynovitis) 07/03/2018  ? Gastroesophageal reflux disease without esophagitis   ? Heart murmur   ? Hypertension   ? Irritable bowel syndrome   ? 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 PONV (postoperative nausea and vomiting)   ? Unilateral primary osteoarthritis, 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  ? ? ?Past Surgical History:  ?Procedure Laterality Date  ? ABDOMINAL HYSTERECTOMY  1988  ? secondary to prolapse  ? BLADDER SUSPENSION    ? A-P with Hyst  ? INSERTION OF MESH  10/16/2018  ? Procedure: INSERTION OF MESH;  Surgeon: Ardis Hughs, MD;  Location: [facility];  Service: Urology;;  ? [place]  ? ROBOTIC ASSISTED LAPAROSCOPIC SACROCOLPOPEXY N/A 10/16/2018  ? Procedure: XI ROBOTIC ASSISTED LAPAROSCOPIC SACROCOLPOPEXY;  Surgeon: Ardis Hughs, MD;  Location: [facility];  Service: Urology;  Laterality: N/A;  ? TONSILLECTOMY    ? TOTAL HIP ARTHROPLASTY Right 11/04/2021  ? Procedure: RIGHT TOTAL HIP ARTHROPLASTY ANTERIOR APPROACH;  Surgeon: Mcarthur Rossetti, MD;  Location: [facility];  Service: Orthopedics;  Laterality: Right;  ? ? ?There were no vitals filed for this visit. ? ? Subjective Assessment - 04/07/22 0843   ? ? Subjective I feel like I shoudl be better. most trouble with reaching behind.  most issues down in arm vs shld   ? Currently in Pain? Yes   ? Pain Score 5    ? Pain Location Arm   ? Pain Orientation Left;Lateral;Upper;Anterior   ? ?  ?  ? ?  ? ? ? ? ? ? ? ? ? ? ? ? ? ? ? ? ? ? ? ? [facility] Adult PT Treatment/Exercise - 04/07/22 0001   ? ?  ? Shoulder Exercises: Supine  ? Other Supine Exercises 2# ER/IR with tactile cuing 2 sets 10   ?  ? Shoulder Exercises: Standing  ? Flexion Strengthening;Left;15 reps   ? Shoulder Flexion Weight (lbs) 2   ? ABduction Strengthening;Left;15 reps   ? Shoulder ABduction Weight (lbs) 2   ? Diagonals Strengthening;Left;15 reps   ? Diagonals Weight (lbs) 2   ? Other Standing Exercises cable pulleys 2 sets 10 5#   shld ext, row, IR and ER ( some assistance with ER) cuing needed  ? Other Standing Exercises empty cane 2# 10x   ?  ? Shoulder Exercises: ROM/Strengthening  ? UBE (Upper Arm Bike) L 3 3 mn fwd/3 min backward   ?  ? Modalities  ? Modalities Iontophoresis   ?  ? Iontophoresis  ? Type of Iontophoresis Dexamethasone   ? Location left bicep tendon origin   ? Dose  1.1 cc   ? Time 80 mA 4 hour patch   ?  ? Manual Therapy  ? Manual therapy comments very tender and pain limited over bicep origen and down tendon   ? Joint Mobilization left GH all motions grade II/III   ? Soft tissue mobilization to deltoid, biceps   ? Passive ROM all GH joint motions with some distraction to end range   ? Neural Stretch L brachial plexus., median nerve   ? ?  ?  ? ?  ? ? ? ? ? ? ? ? ? ? ? ? PT Short Term Goals - 03/07/22 1111   ? ?  ? PT SHORT TERM GOAL #1  ? Title I with inital HEP   ? Baseline Initiated   ? Time 4   ? Period Weeks   ? Status On-going   ? Target Date 03/30/22   ? ?  ?  ? ?  ? ? ? ? PT Long Term Goals - 04/03/22 1109   ? ?  ? PT LONG TERM GOAL #1  ? Title I with final HEP   ? Status On-going   ?  ? PT LONG TERM GOAL #2  ? Title Patient will achieve full, functional AROM of L shoulder with pain <3/10   ? Status On-going   ?  ? PT LONG TERM GOAL #3  ? Title Patient will  achieve L shoulder strength of at least 4-/5 with pain < 3/10   ? Status On-going   ? ?  ?  ? ?  ? ? ? ? ? ? ? ? Plan - 04/07/22 0912   ? ? Clinical Impression Statement pt arrived still with pain and frustration. feels she shoyudl be better by now. does verb some improvements but limited mostly withreaching and IR. painful with all STW and PROM-very tender over bicep so added ionto to see if helps with pain. rec calling MD to get follow up. goals were assessed last session and progressing slowly   ? PT Treatment/Interventions ADLs/Self Care Home Management;Iontophoresis '4mg'$ /ml Dexamethasone;Moist Heat;Ultrasound;Electrical Stimulation;Cryotherapy;Manual techniques;Dry needling;Passive range of motion;Functional mobility training;Therapeutic activities;Therapeutic exercise;Patient/family education;Vasopneumatic Device   ? PT Next Visit Plan assess ionto? MD appt?   ? ?  ?  ? ?  ? ? ?Patient will benefit from skilled therapeutic intervention in order to improve the following deficits and impairments:  Decreased range of motion, Increased fascial restricitons, Pain, Decreased strength, Increased edema, Postural dysfunction, Decreased mobility, Improper body mechanics, Impaired flexibility ? ?Visit Diagnosis: ?Chronic left shoulder pain ? ?Muscle weakness (generalized) ? ?Localized edema ? ? ? ? ?Problem List ?Patient Active Problem List  ? Diagnosis Date Noted  ? Diverticulosis 11/16/2021  ? Dyslipidemia 11/16/2021  ? History of Clostridium difficile infection 11/16/2021  ? 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 DDD (degenerative disc disease), lumbar 09/28/2019  ? Aortic atherosclerosis ([place]) 09/28/2019  ? OSA (obstructive sleep apnea) 01/27/2019  ? Pulmonary nodule 01/27/2019  ? Allergic rhinitis 11/06/2018  ? Upper airway cough syndrome 09/24/2018  ? Essential hypertension 06/03/2018  ? Anxiety and depression 01/17/2017  ? Vitamin D deficiency 01/17/2017  ? Osteopenia 04/08/2015  ? Irritable bowel  syndrome (IBS) 02/03/2008  ? ? ?Tasmin Exantus,ANGIE, PTA ?04/07/2022, 9:14 AM ? ?[facility] ?[address] ?[place], [place], [address] ?Phone: [phone]   Fax:  [phone] ? ?Name: Gloria Werner ?MRN: [id number] ?Date of Birth: 1941-05-25 ? ? ? ?

## 2022-04-11 ENCOUNTER — Ambulatory Visit: Payer: Medicare Other | Admitting: Physical Therapy

## 2022-04-12 ENCOUNTER — Telehealth: Payer: Self-pay

## 2022-04-12 NOTE — Progress Notes (Signed)
Chronic Care Management APPOINTMENT REMINDER ? ? ?Called Gloria Werner, No answer, left message of appointment on 04/13/2022 at 8:30 am via telephone visit with Junius Argyle , Pharm D. Notified to have all medications, supplements, blood pressure and/or blood sugar logs available during appointment and to return call if need to reschedule. ? ?Anderson Malta ?Clinical Pharmacist Assistant ?[phone]  ? ? ?

## 2022-04-13 ENCOUNTER — Ambulatory Visit: Payer: Medicare Other | Admitting: Physical Therapy

## 2022-04-13 ENCOUNTER — Ambulatory Visit (INDEPENDENT_AMBULATORY_CARE_PROVIDER_SITE_OTHER): Payer: Medicare Other

## 2022-04-13 DIAGNOSIS — E785 Hyperlipidemia, unspecified: Secondary | ICD-10-CM

## 2022-04-13 DIAGNOSIS — M8588 Other specified disorders of bone density and structure, other site: Secondary | ICD-10-CM

## 2022-04-13 DIAGNOSIS — I10 Essential (primary) hypertension: Secondary | ICD-10-CM

## 2022-04-13 NOTE — Patient Instructions (Signed)
Visit Information ?It was great speaking with you today!  Please let me know if you have any questions about our visit. ? ? Goals Addressed   ? ?  ?  ?  ?  ? This Visit's Progress  ?  Track and Manage My Blood Pressure-Hypertension   On track  ?  Timeframe:  Long-Range Goal ?Priority:  High ?Start Date: 10/17/2021                            ?Expected End Date: 10/17/2022                   ? ?Follow Up within 90 days ?  ?- check blood pressure weekly  ?  ?Why is this important?   ?You won't feel high blood pressure, but it can still hurt your blood vessels.  ?High blood pressure can cause heart or kidney problems. It can also cause a stroke.  ?Making lifestyle changes like losing a little weight or eating less salt will help.  ?Checking your blood pressure at home and at different times of the day can help to control blood pressure.  ?If the doctor prescribes medicine remember to take it the way the doctor ordered.  ?Call the office if you cannot afford the medicine or if there are questions about it.   ?  ?Notes:  ?  ? ?  ? ? ?Patient Care Plan: General Pharmacy (Adult)  ?  ? ?Problem Identified: Hypertension, Hyperlipidemia, Coronary Artery Disease, Anxiety, Osteopenia, Osteoarthritis, and Irritable Bowel Syndrome   ?Priority: High  ?  ? ?Long-Range Goal: Patient-Specific Goal   ?Start Date: 10/17/2021  ?Expected End Date: 10/17/2022  ?This Visit's Progress: On track  ?Recent Progress: On track  ?Priority: High  ?Note:   ?Current Barriers:  ?Unable to achieve control of cholesterol  ? ?Pharmacist Clinical Goal(s):  ?Patient will achieve control of cholesterol as evidenced by LDL less than 70 through collaboration with PharmD and provider.  ? ?Interventions: ?1:1 collaboration with Arlester Marker MD regarding development and update of comprehensive plan of care as evidenced by provider attestation and co-signature ?Inter-disciplinary care team collaboration (see longitudinal plan of care) ?Comprehensive medication  review performed; medication list updated in electronic medical record ? ?Hypertension (BP goal <140/90) ?-Controlled ?-Current treatment: ?Amlodipine 5 mg daily  ?-Medications previously tried: Lisinopril (Cough)   ?-Current home readings: Does not monitor regularly  ?-Current dietary habits: 3-4 cups of water daily, 1 diet coke daily. 2 cups of coffee in the mornings. Trying to eat less.  ?-Current exercise habits: Unable to exercise due to hip  ?-Continues to experience intermittent cough despite stopping lisinopril. Recent visit with pulmonology recommended gabapentin or PPI to see if cough improves, patient did not want any changes at that time.  ?-Denies hypotensive/hypertensive symptoms ?-Recommended to continue current medication ? ?Hyperlipidemia: (LDL goal < 70) ?-Controlled ?-History of aortic atherosclerosis  ?-Current treatment: ?Rosuvastatin 5 mg three times weekly (only taking 2-3 times weekly)  ?-Medications previously tried: NA  ?-Does report muscle aches. Overall bearable, but unable to tolerate more than 3x weekly dosing of rosuvastatin ? ?-Recommended to continue current medication ? ?Anxiety (Goal: Achieve symptom remission) ?-Not ideally controlled ?-Current treatment: ?Bupropion 75 mg twice daily  ?-Medications previously tried/failed: NA ?-Feels more stress, irritable recently.  ?-PHQ9: 0 ?-GAD7: 5 ?-Bupropion is questionably appropriate for use with anxiety, patient reports that it helps "take the edge off" so she feels less anxious  and sleep better.  ?-Recommended to continue current medication ? ?Osteopenia (Goal Prevent fractures) ?-Uncontrolled ?-Patient is a candidate for pharmacologic treatment due to T-Score -1.0 to -2.5 and 10-year risk of hip fracture > 3% ?-Current treatment  ?Calcium Carbonate 600 mg daily  ?Ergocalciferol 50,000 units monthly ?-Medications previously tried: NA  ?-Recommend Al[place]ronate 70 mg weekly  ? ?IBS (Goal: Improve symptoms) ?-Controlled ?-Current treatment   ?Dicylcomine 10 mg four times daily  ?Florastor 250 mg 2 caps twice daily  ?-Medications previously tried: Linzess (cost)  ?-Recommended to continue current medication ? ?Osteoarthritis (Goal: Improve symptoms ) ?-Controlled ?-Current treatment  ?Acetaminophen 500 mg 1-2 tablets nightly.  ?Meloxicam 7.5 mg daily  ?-Medications previously tried: NA  ?-Hip replacement scheduled for November  ?-Counseled on risks of long-term NSAID use ?-Recommended to continue current medication ? ?Patient Goals/Self-Care Activities ?Patient will:  ?- check blood pressure weekly, document, and provide at future appointments ? ?Follow Up Plan: Telephone follow up appointment with care management team member scheduled for:  10/12/2022 at 8:30 AM ?  ? ? ?Patient agreed to services and verbal consent obtained.  ? ?Patient verbalizes understanding of instructions and care plan provided today and agrees to view in [place]. Active MyChart status confirmed with patient.   ? ?Junius Argyle, PharmD, BCACP, CPP ?Clinical Pharmacist Practitioner  ?[facility]  ?[phone]  ?

## 2022-04-13 NOTE — Progress Notes (Signed)
? ?Chronic Care Management ?Pharmacy Note ? ?04/13/2022 ?Name:  Gloria Werner MRN:  [id number] DOB:  05/21/1941 ? ?Summary: ?Patient presents for CCM follow-up.  ?-Patient is a candidate for osteoporosis treatment due to Hip T-Score -1.0 to -2.5 and 10-year risk of hip fracture > 3% ? ?Recommendations/Changes made from today's visit: ?-Recommend Alendronate 70 mg weekly  ? ?Plan: ?CPP follow-up 6 months  ? ?Subjective: ?Gloria Werner is an 81 y.o. year old female who is a primary patient of Arlester Marker MD.  The CCM team was consulted for assistance with disease management and care coordination needs.   ? ?Engaged with patient by telephone for follow up visit in response to provider referral for pharmacy case management and/or care coordination services.  ? ?Consent to Services:  ?The patient was given information about Chronic Care Management services, agreed to services, and gave verbal consent prior to initiation of services.  Please see initial visit note for detailed documentation.  ? ?Patient Care Team: ?Haydee Salter, MD as PCP - General (Family Medicine) ?Irene Shipper, MD (Gastroenterology) ?Thalia Bloodgood, OD as Referring Physician (Optometry) ?Kem Boroughs, FNP as Nurse Practitioner (Nurse Practitioner) ?Germaine Pomfret, [facility] as Pharmacist (Pharmacist) ?Tanda Rockers, MD as Consulting Physician (Pulmonary Disease) ?Mcarthur Rossetti, MD as Consulting Physician (Orthopedic Surgery) ? ?Recent office visits: ?04/07/22: Patient presented to Dr. Gena Fray for shoulder pain. Referral to Ortho.  ? ?Recent consult visits: ?08/16/2021 Dr. Melvyn Novas MD (Pulmonology) No medication Changes noted ?08/16/2021 [name] NP (Gastroenterology) start Linzess 72 mcg 1 tab to be taken 30 minutes prior to breakfast,Miralax if needed at bedtime, IBgard 1 capsule twice a day as needed for abdominal pain.gas/bloating,Follow-up in the office in 2 to 3 months ? ? ?Hospital visits: ?None in previous 6  months ? ? ?Objective: ? ?Lab Results  ?Component Value Date  ? CREATININE 0.72 02/17/2022  ? BUN 15 02/17/2022  ? GFR 80.20 02/17/2022  ? GFRNONAA >60 11/05/2021  ? GFRAA >60 10/17/2018  ? NA 140 02/17/2022  ? K 4.6 02/17/2022  ? CALCIUM 9.3 02/17/2022  ? CO2 32 02/17/2022  ? GLUCOSE 95 02/17/2022  ? ? ?Lab Results  ?Component Value Date/Time  ? HGBA1C 5.6 02/17/2022 09:23 AM  ? GFR 80.20 02/17/2022 09:23 AM  ? GFR 64.04 08/03/2020 11:35 AM  ?  ?Last diabetic Eye exam: No results found for: HMDIABEYEEXA  ?Last diabetic Foot exam: No results found for: HMDIABFOOTEX  ? ?Lab Results  ?Component Value Date  ? CHOL 133 02/17/2022  ? HDL 48.10 02/17/2022  ? [place] 69 02/17/2022  ? LDLDIRECT 84.0 08/03/2020  ? TRIG 79.0 02/17/2022  ? CHOLHDL 3 02/17/2022  ? ? ? ?  Latest Ref Rng & Units 02/17/2022  ?  9:23 AM 08/03/2020  ? 11:35 AM 12/29/2019  ?  8:05 AM  ?Hepatic Function  ?Total Protein 6.0 - 8.3 g/dL 6.7   7.0   7.3    ?Albumin 3.5 - 5.2 g/dL 4.4   4.4   4.5    ?AST 0 - 37 U/L 19   21   27     ?ALT 0 - 35 U/L 13   16   21     ?Alk Phosphatase 39 - 117 U/L 54   54   53    ?Total Bilirubin 0.2 - 1.2 mg/dL 0.4   0.5   0.4    ? ? ?Lab Results  ?Component Value Date/Time  ? TSH 1.64 09/29/2019 08:39 AM  ?  TSH 2.02 09/04/2018 07:53 AM  ? ? ? ?  Latest Ref Rng & Units 02/17/2022  ?  9:23 AM 11/05/2021  ? 10:54 AM 10/31/2021  ?  8:25 AM  ?CBC  ?WBC 4.0 - 10.5 K/uL 4.4   10.9   5.0    ?Hemoglobin 12.0 - 15.0 g/dL 13.0   10.8   13.2    ?Hematocrit 36.0 - 46.0 % 39.8   32.6   40.3    ?Platelets 150.0 - 400.0 K/uL 185.0   146   179    ? ? ?Lab Results  ?Component Value Date/Time  ? VD25OH 35.18 02/17/2022 09:23 AM  ? VD25OH 33.36 08/03/2020 11:35 AM  ? ? ?Clinical ASCVD: No  ?The 10-year ASCVD risk score (Arnett DK, et al., 2019) is: 31.1% ?  Values used to calculate the score: ?    Age: 60 years ?    Sex: Female ?    Is Non-Hispanic African American: No ?    Diabetic: No ?    Tobacco smoker: No ?    Systolic Blood Pressure: 009 mmHg ?     Is BP treated: Yes ?    HDL Cholesterol: 48.1 mg/dL ?    Total Cholesterol: 133 mg/dL   ? ? ?  02/17/2022  ?  8:51 AM 09/20/2021  ?  8:43 AM 09/14/2020  ?  8:23 AM  ?Depression screen PHQ 2/9  ?Decreased Interest 0 0 0  ?Down, Depressed, Hopeless 0 0 0  ?PHQ - 2 Score 0 0 0  ?  ?Social History  ? ?Tobacco Use  ?Smoking Status Never  ?Smokeless Tobacco Never  ? ?BP Readings from Last 3 Encounters:  ?04/07/22 136/82  ?03/13/22 140/78  ?02/17/22 138/76  ? ?Pulse Readings from Last 3 Encounters:  ?04/07/22 77  ?03/13/22 80  ?02/17/22 76  ? ?Wt Readings from Last 3 Encounters:  ?04/07/22 152 lb 3.2 oz (69 kg)  ?03/13/22 152 lb 6.4 oz (69.1 kg)  ?02/17/22 150 lb 12.8 oz (68.4 kg)  ? ?BMI Readings from Last 3 Encounters:  ?04/07/22 30.23 kg/m?  ?03/13/22 29.96 kg/m?  ?02/17/22 29.65 kg/m?  ? ?-Last DEXA Scan: 12/21/20  ? T-Score femoral neck: -2.3 ? T-Score total hip: -1.5 ? T-Score lumbar spine: NA ? T-Score forearm radius: -2.0 ? 10-year probability of major osteoporotic fracture: 16.2% ? 10-year probability of hip fracture: 5.0% ? ?Assessment/Interventions: Review of patient past medical history, allergies, medications, health status, including review of consultants reports, laboratory and other test data, was performed as part of comprehensive evaluation and provision of chronic care management services.  ? ?SDOH:  (Social Determinants of Health) assessments and interventions performed: Yes ? ? ?SDOH Screenings  ? ?Alcohol Screen: Low Risk   ? Last Alcohol Screening Score (AUDIT): 0  ?Depression (PHQ2-9): Low Risk   ? PHQ-2 Score: 0  ?Financial Resource Strain: Low Risk   ? Difficulty of Paying Living Expenses: Not hard at all  ?Food Insecurity: No Food Insecurity  ? Worried About Charity fundraiser in the Last Year: Never true  ? Ran Out of Food in the Last Year: Never true  ?Housing: Low Risk   ? Last Housing Risk Score: 0  ?Physical Activity: Insufficiently Active  ? Days of Exercise per Week: 4 days  ? Minutes of  Exercise per Session: 30 min  ?Social Connections: Moderately Integrated  ? Frequency of Communication with Friends and Family: More than three times a week  ? Frequency of Social Gatherings  with Friends and Family: More than three times a week  ? Attends Religious Services: More than 4 times per year  ? Active Member of Clubs or Organizations: No  ? Attends Archivist Meetings: Never  ? Marital Status: Married  ?Stress: No Stress Concern Present  ? Feeling of Stress : Not at all  ?Tobacco Use: Low Risk   ? Smoking Tobacco Use: Never  ? Smokeless Tobacco Use: Never  ? Passive Exposure: Not on file  ?Transportation Needs: No Transportation Needs  ? Lack of Transportation (Medical): No  ? Lack of Transportation (Non-Medical): No  ? ? ?CCM Care Plan ? ?Allergies  ?Allergen Reactions  ? Lexapro [Escitalopram] Diarrhea and Other (See Comments)  ?  headache  ? Ace Inhibitors Cough  ? Codeine Nausea And Vomiting  ? ? ?Medications Reviewed Today   ? ? Reviewed by Germaine Pomfret, [place] (Pharmacist) on 04/13/22 at 1021  Med List Status: <None>  ? ?Medication Order Taking? Sig Documenting Provider Last Dose Status Informant  ?acetaminophen (TYLENOL) 500 MG tablet [id number] No Take 500-1,000 mg by mouth at bedtime as needed for moderate pain. [provider] Taking Active Self  ?albuterol (VENTOLIN HFA) 108 (90 Base) MCG/ACT inhaler [id number] No Inhale 1-2 puffs into the lungs every 6 (six) hours as needed. Parrett, Fonnie Mu, NP Taking Active   ?amLODipine (NORVASC) 5 MG tablet [id number] No Take 1 tablet (5 mg total) by mouth daily. Haydee Salter, MD Taking Active   ?Biotin 10000 MCG TABS [id number] No Take 10,000 mcg by mouth daily. [provider] Taking Active Self  ?buPROPion (WELLBUTRIN) 75 MG tablet [id number] No Take 1 tablet (75 mg total) by mouth daily.  ?Patient taking differently: Take 75 mg by mouth daily. Reports taking "as needed".  ? Haydee Salter, MD Taking Active   ?calcium  carbonate (OSCAL) 1500 (600 Ca) MG TABS tablet [id number] No Take 600 mg of elemental calcium by mouth daily with breakfast. [provider] Taking Active Self  ?Carboxymethylcellul-Glycerin (LUBRIC

## 2022-04-18 ENCOUNTER — Telehealth: Payer: Self-pay | Admitting: Family Medicine

## 2022-04-18 ENCOUNTER — Ambulatory Visit: Payer: Medicare Other | Admitting: Physical Therapy

## 2022-04-18 NOTE — Telephone Encounter (Signed)
Pt has appt on Thursday 9:45 and the Dr has requested her xray for this appt. Please advise her how or when to pick it up. ?

## 2022-04-18 NOTE — Telephone Encounter (Signed)
Patient notified VIA phone.  No questions.  Dm/cma ? ?

## 2022-04-18 NOTE — Telephone Encounter (Signed)
Spoke to patient, she has an appointment with [facility] on Thursday and they want her to bring a disc with the shoulder x rays.  Can you do that?  Dm/cma ? ?

## 2022-04-18 NOTE — Telephone Encounter (Signed)
Lft VM to rtn call patient needs to call number below to get CD. Dm/cma ? ? ? ?[facility] ? [address] [place],  [address] ? ? Phone [phone] [phone] ?

## 2022-04-20 ENCOUNTER — Ambulatory Visit: Payer: Medicare Other | Admitting: Physical Therapy

## 2022-04-20 DIAGNOSIS — M25512 Pain in left shoulder: Secondary | ICD-10-CM | POA: Diagnosis not present

## 2022-04-23 DIAGNOSIS — E785 Hyperlipidemia, unspecified: Secondary | ICD-10-CM

## 2022-04-23 DIAGNOSIS — I10 Essential (primary) hypertension: Secondary | ICD-10-CM

## 2022-04-30 DIAGNOSIS — M25512 Pain in left shoulder: Secondary | ICD-10-CM | POA: Diagnosis not present

## 2022-05-05 DIAGNOSIS — M75122 Complete rotator cuff tear or rupture of left shoulder, not specified as traumatic: Secondary | ICD-10-CM | POA: Diagnosis not present

## 2022-05-05 DIAGNOSIS — M19012 Primary osteoarthritis, left shoulder: Secondary | ICD-10-CM | POA: Diagnosis not present

## 2022-05-09 DIAGNOSIS — M546 Pain in thoracic spine: Secondary | ICD-10-CM | POA: Diagnosis not present

## 2022-05-09 DIAGNOSIS — R1033 Periumbilical pain: Secondary | ICD-10-CM | POA: Diagnosis not present

## 2022-05-15 ENCOUNTER — Ambulatory Visit: Payer: Medicare Other | Admitting: Adult Health

## 2022-05-15 ENCOUNTER — Encounter: Payer: Self-pay | Admitting: Adult Health

## 2022-05-15 VITALS — BP 140/80 | HR 90 | Temp 98.8°F | Ht 59.75 in | Wt 154.2 lb

## 2022-05-15 DIAGNOSIS — G4733 Obstructive sleep apnea (adult) (pediatric): Secondary | ICD-10-CM | POA: Diagnosis not present

## 2022-05-15 DIAGNOSIS — R058 Other specified cough: Secondary | ICD-10-CM

## 2022-05-15 LAB — POCT EXHALED NITRIC OXIDE: FeNO level (ppb): 18

## 2022-05-15 MED ORDER — BENZONATATE 200 MG PO CAPS: 200.0000 mg | ORAL_CAPSULE | Freq: Three times a day (TID) | ORAL | 3 refills | Status: DC | PRN

## 2022-05-15 NOTE — Assessment & Plan Note (Signed)
Mild obstructive sleep apnea.  Patient is trying to wear her CPAP.  Have encouraged her on increased compliance.  We also discussed oral appliance positional sleep and weight loss.  Patient states she is going to give CPAP about 6 more months and if unable to tolerate then she will change over to possible oral appliance.  Plan  Patient Instructions  Mucinex Twice daily  As needed  cough/congestion  Delsym 2 tsp Twice daily  for cough As needed   Tessalon Three times a day  for cough as needed.  Pepcid '20mg'$  At bedtime   Zyrtec '10mg'$  At bedtime   Saline nasal Twice daily   Saline nasal gel At bedtime  .  Albuterol inhaler 1-2 puffs every 6hrs as needed for cough /wheezing .  Avoid throat clearing , use sips of water to help with cough and throat clearing . May try honey to soothe cough .  NO MINTS .   Continue on CPAP at night , try to wear all night .  Change CPAP pressure to 8 to 12cm.  Work on healthy weight loss Do not drive if sleepy .  Melatonin At bedtime  for insomnia As needed     Follow up with Dr. Melvyn Novas  or Amire Leazer NP in 6 months and As needed   Please contact office for sooner follow up if symptoms do not improve or worsen or seek emergency care

## 2022-05-15 NOTE — Addendum Note (Signed)
Addended by: Vanessa Ericca on: 05/15/2022 11:25 AM   Modules accepted: Orders

## 2022-05-15 NOTE — Patient Instructions (Addendum)
Mucinex Twice daily  As needed  cough/congestion  Delsym 2 tsp Twice daily  for cough As needed   Tessalon Three times a day  for cough as needed.  Pepcid '20mg'$  At bedtime   Zyrtec '10mg'$  At bedtime   Saline nasal Twice daily   Saline nasal gel At bedtime  .  Albuterol inhaler 1-2 puffs every 6hrs as needed for cough /wheezing .  Avoid throat clearing , use sips of water to help with cough and throat clearing . May try honey to soothe cough .  NO MINTS .   Continue on CPAP at night , try to wear all night .  Change CPAP pressure to 8 to 12cm.  Work on healthy weight loss Do not drive if sleepy .  Melatonin At bedtime  for insomnia As needed     Follow up with Dr. Melvyn Novas  or Laquasha Groome NP in 6 months and As needed   Please contact office for sooner follow up if symptoms do not improve or worsen or seek emergency care

## 2022-05-15 NOTE — Assessment & Plan Note (Signed)
Patient has had an extensive work-up with high-resolution CT chest negative for ILD.  Pulmonary function testing has shown no airflow obstruction or restriction..  Have treated her for possible reactive airways component. Exhaled nitric oxide testing was normal.  Lab work with CBC shows normal eosinophils. Continue to treat for triggers and cough control   Plan  Patient Instructions  Mucinex Twice daily  As needed  cough/congestion  Delsym 2 tsp Twice daily  for cough As needed   Tessalon Three times a day  for cough as needed.  Pepcid '20mg'$  At bedtime   Zyrtec '10mg'$  At bedtime   Saline nasal Twice daily   Saline nasal gel At bedtime  .  Albuterol inhaler 1-2 puffs every 6hrs as needed for cough /wheezing .  Avoid throat clearing , use sips of water to help with cough and throat clearing . May try honey to soothe cough .  NO MINTS .   Continue on CPAP at night , try to wear all night .  Change CPAP pressure to 8 to 12cm.  Work on healthy weight loss Do not drive if sleepy .  Melatonin At bedtime  for insomnia As needed     Follow up with Dr. Melvyn Novas  or Jaecob Lowden NP in 6 months and As needed   Please contact office for sooner follow up if symptoms do not improve or worsen or seek emergency care

## 2022-05-15 NOTE — Progress Notes (Signed)
$'@Patient'Q$  ID: Gloria Werner, female    DOB: 07-Nov-1941, 81 y.o.   MRN: [id number]  Chief Complaint  Patient presents with   Follow-up    Referring provider: Haydee Salter, MD  HPI: 81 year old female never smoker followed for chronic cough and obstructive sleep apnea  TEST/EVENTS :  Mild obstructive sleep apnea occurred during this study (AHI = 8.3/h). - No significant central sleep apnea occurred during this study (CAI = 1.8/h). - Mild oxygen desaturation was noted during this study (Min O2 = 84.0%).   Spirometry 2020 normal .   HRCT chest 08/2019 -Neg for ILD  mild air trapping .   05/15/2022 Follow up : Chronic cough and OSA  Patient returns for 51-monthfollow-up.  She has underlying sleep apnea.  She says she is trying to use her CPAP every single night. Only 63% for >4hrs.  She is getting at least around 4 to 5 hours.  Last visit CPAP pressures were changed.  CPAP download shows excellent compliance with 93% usage.  Daily average usage at 5 hours.  Patient is on auto CPAP 8 to 12 cm H2O.  AHI 2.1.  Daily average pressure at 10.5 cm H2O. Patient says since last visit she has torn rotator cuff on the left and keeps her up at night. Hard to tell if CPAP is helping .  We talked about alternatives such as oral appliance positional sleep and weight loss.  Patient says she is going to try CPAP for a little while longer but if unable to tolerate she is changes something else.  Patient has a history of chronic cough this been going on for greater than 4 years.  Last visit she was having a flare.  She was recommended to begin Delsym and Tessalon.  She was treated for cough triggers for GERD and chronic rhinitis.  She was set up for pulmonary function testing that showed FEV1 at 149%, ratio 85, FVC 129%, no significant bronchodilator response, DLCO 122 %.  Chest x-ray August 16, 2021 showed clear lungs. Cough is some better but not gone .  Has thick clear mucus intermittently.  Exhaled  nitric oxide testing today is 18 ppb .  [facility].  Patient says overall she is doing okay.  Says she is mainly limited right now due to her shoulder issues.   Allergies  Allergen Reactions   Lexapro [Escitalopram] Diarrhea and Other (See Comments)    headache   Ace Inhibitors Cough   Codeine Nausea And Vomiting    Immunization History  Administered Date(s) Administered   Fluad Quad(high Dose 65+) 09/16/2019   H1N1 01/07/2009   Influenza Whole 09/23/2012   Influenza, High Dose Seasonal PF 10/11/2017   Influenza,inj,Quad PF,6+ Mos 09/28/2015, 08/02/2016, 09/04/2018   Influenza-Unspecified 08/25/2014, 09/06/2020, 10/14/2021   PFIZER(Purple Top)SARS-COV-2 Vaccination 01/09/2020, 02/11/2020   Pneumococcal Conjugate-13 02/18/2014   Pneumococcal Polysaccharide-23 10/22/2012   Td 10/22/2012   Zoster Recombinat (Shingrix) 12/10/2018, 04/01/2019   Zoster, Live 11/28/2012    Past Medical History:  Diagnosis Date   Allergic rhinitis due to pollen    Cancer ([facility]    Cataract    NS OU   Complication of anesthesia    Cough    De Quervain's syndrome (tenosynovitis) 07/03/2018   Gastroesophageal reflux disease without esophagitis    Heart murmur    Hypertension    Irritable bowel syndrome    Lichen simplex chronicus 09/2004  PONV (postoperative nausea and vomiting)    Unilateral primary osteoarthritis, 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     albuterol (VENTOLIN HFA) 108 (90 Base) MCG/ACT inhaler Inhale 1-2 puffs into the lungs every 6 (six) hours as needed. 8 g 2   amLODipine (NORVASC) 5 MG tablet Take 1 tablet  (5 mg total) by mouth daily. 90 tablet 1   Biotin 10000 MCG TABS Take 10,000 mcg by mouth daily.     buPROPion (WELLBUTRIN) 75 MG tablet Take 1 tablet (75 mg total) by mouth daily. (Patient taking differently: Take 75 mg by mouth daily. Reports taking "as needed".) 90 tablet 3   calcium carbonate (OSCAL) 1500 (600 Ca) MG TABS tablet Take 600 mg of elemental calcium by mouth daily with breakfast.     Carboxymethylcellul-Glycerin (LUBRICATING EYE DROPS OP) Place 1 drop into both eyes daily as needed (dry eyes).     Cyanocobalamin (VITAMIN B-12) 500 MCG LOZG Take 1,000 mcg by mouth daily.     dicyclomine (BENTYL) 10 MG capsule Take 1 capsule (10 mg total) by mouth 4 (four) times daily -  before meals and at bedtime. (Patient taking differently: Take 10 mg by mouth 4 (four) times daily as needed for spasms.) 120 capsule 0   meloxicam (MOBIC) 7.5 MG tablet Take 1 tablet (7.5 mg total) by mouth daily. 90 tablet 1   polyethylene glycol (MIRALAX / GLYCOLAX) 17 g packet Take 17 g by mouth daily. Takes an extra scoop in the evening as needed for moderate-severe constipation.     pyridOXINE (VITAMIN B-6) 100 MG tablet Take 100 mg by mouth daily.     rosuvastatin (CRESTOR) 5 MG tablet Take 1 tablet (5 mg total) by mouth 3 (three) times a week. 36 tablet 1   saccharomyces boulardii (FLORASTOR) 250 MG capsule Take 2 capsules (500 mg total) by mouth 2 (two) times daily. (Patient taking differently: Take 250 mg by mouth daily.) 120 capsule 3   sodium chloride (OCEAN) 0.65 % SOLN nasal spray Place 1 spray into both nostrils 2 (two) times daily as needed for congestion.     vitamin C (ASCORBIC ACID) 500 MG tablet Take 500 mg by mouth daily.     Vitamin D, Ergocalciferol, (DRISDOL) 1.25 MG (50000 UNIT) CAPS capsule Take 1 capsule (50,000 Units total) by mouth every 30 (thirty) days. 3 capsule 3   No facility-administered medications prior to visit.     Review of Systems:   Constitutional:   No  weight loss, night  sweats,  Fevers, chills, fatigue, or  lassitude.  HEENT:   No headaches,  Difficulty swallowing,  Tooth/dental problems, or  Sore throat,                No sneezing, itching, ear ache,  +nasal congestion, post nasal drip,   CV:  No chest pain,  Orthopnea, PND, swelling in lower extremities, anasarca, dizziness, palpitations, syncope.   GI  No heartburn, indigestion, abdominal pain, nausea, vomiting, diarrhea, change in bowel habits, loss of appetite, bloody stools.   Resp:   No chest wall deformity  Skin: no rash or lesions.  GU: no dysuria, change in color of urine, no urgency or  frequency.  No flank pain, no hematuria   MS:  No joint pain or swelling.  No decreased range of motion.  No back pain.    Physical Exam  BP 140/80 (BP Location: Left Arm, Patient Position: Sitting, Cuff Size: Normal)   Pulse 90   Temp 98.8 F (37.1 C) (Oral)   Ht 4' 11.75" (1.518 m)   Wt 154 lb 3.2 oz (69.9 kg)   LMP 12/25/1986 (Approximate)   SpO2 98%   BMI 30.37 kg/m   GEN: A/Ox3; pleasant , NAD, well nourished    HEENT:  [place]/AT,  NOSE-clear, THROAT-clear, no lesions, no postnasal drip or exudate noted.   NECK:  Supple w/ fair ROM; no JVD; normal carotid impulses w/o bruits; no thyromegaly or nodules palpated; no lymphadenopathy.    RESP  Clear  P & A; w/o, wheezes/ rales/ or rhonchi. no accessory muscle use, no dullness to percussion  CARD:  RRR, Gr 1/6 SM  no peripheral edema, pulses intact, no cyanosis or clubbing.  GI:   Soft & nt; nml bowel sounds; no organomegaly or masses detected.   Musco: Warm bil, no deformities or joint swelling noted.   Neuro: alert, no focal deficits noted.    Skin: Warm, no lesions or rashes    Lab Results:  CBC    Component Value Date/Time   WBC 4.4 02/17/2022 0923   RBC 4.45 02/17/2022 0923   HGB 13.0 02/17/2022 0923   HCT 39.8 02/17/2022 0923   PLT 185.0 02/17/2022 0923   MCV 89.3 02/17/2022 0923   MCH 30.1 11/05/2021 1054   MCHC 32.8  02/17/2022 0923   RDW 13.9 02/17/2022 0923   LYMPHSABS 1.2 09/29/2019 0839   MONOABS 0.4 09/29/2019 0839   EOSABS 0.1 09/29/2019 0839   BASOSABS 0.0 09/29/2019 0839    BMET    Component Value Date/Time   NA 140 02/17/2022 0923   K 4.6 02/17/2022 0923   CL 105 02/17/2022 0923   CO2 32 02/17/2022 0923   GLUCOSE 95 02/17/2022 0923   BUN 15 02/17/2022 0923   CREATININE 0.72 02/17/2022 0923   CALCIUM 9.3 02/17/2022 0923   GFRNONAA >60 11/05/2021 1054   GFRAA >60 10/17/2018 0514    BNP No results found for: BNP  ProBNP    Component Value Date/Time   PROBNP 31.0 09/29/2019 0839    Imaging: No results found.       Latest Ref Rng & Units 02/14/2022    8:58 AM 11/27/2018   10:53 AM  PFT Results  FVC-Pre L 2.87   3.04    FVC-Predicted Pre % 131   129    FVC-Post L 2.84   3.02    FVC-Predicted Post % 129   128    Pre FEV1/FVC % % 83   80    Post FEV1/FCV % % 85   85    FEV1-Pre L 2.37   2.44    FEV1-Predicted Pre % 146   139    FEV1-Post L 2.42   2.56    DLCO uncorrected ml/min/mmHg 20.00   20.78    DLCO UNC% % 122   112    DLCO corrected ml/min/mmHg 20.00   20.91    DLCO COR %Predicted % 122   112    DLVA Predicted % 102   104    TLC L 5.37   5.36    TLC % Predicted % 121   121    RV % Predicted % 116  117      Lab Results  Component Value Date   NITRICOXIDE 13 10/01/2018        Assessment & Plan:   OSA (obstructive sleep apnea) Mild obstructive sleep apnea.  Patient is trying to wear her CPAP.  Have encouraged her on increased compliance.  We also discussed oral appliance positional sleep and weight loss.  Patient states she is going to give CPAP about 6 more months and if unable to tolerate then she will change over to possible oral appliance.  Plan  Patient Instructions  Mucinex Twice daily  As needed  cough/congestion  Delsym 2 tsp Twice daily  for cough As needed   Tessalon Three times a day  for cough as needed.  Pepcid '20mg'$  At bedtime    Zyrtec '10mg'$  At bedtime   Saline nasal Twice daily   Saline nasal gel At bedtime  .  Albuterol inhaler 1-2 puffs every 6hrs as needed for cough /wheezing .  Avoid throat clearing , use sips of water to help with cough and throat clearing . May try honey to soothe cough .  NO MINTS .   Continue on CPAP at night , try to wear all night .  Change CPAP pressure to 8 to 12cm.  Work on healthy weight loss Do not drive if sleepy .  Melatonin At bedtime  for insomnia As needed     Follow up with Dr. Melvyn Novas  or Lindon Kiel NP in 6 months and As needed   Please contact office for sooner follow up if symptoms do not improve or worsen or seek emergency care        Upper airway cough syndrome Patient has had an extensive work-up with high-resolution CT chest negative for ILD.  Pulmonary function testing has shown no airflow obstruction or restriction..  Have treated her for possible reactive airways component. Exhaled nitric oxide testing was normal.  Lab work with CBC shows normal eosinophils. Continue to treat for triggers and cough control   Plan  Patient Instructions  Mucinex Twice daily  As needed  cough/congestion  Delsym 2 tsp Twice daily  for cough As needed   Tessalon Three times a day  for cough as needed.  Pepcid '20mg'$  At bedtime   Zyrtec '10mg'$  At bedtime   Saline nasal Twice daily   Saline nasal gel At bedtime  .  Albuterol inhaler 1-2 puffs every 6hrs as needed for cough /wheezing .  Avoid throat clearing , use sips of water to help with cough and throat clearing . May try honey to soothe cough .  NO MINTS .   Continue on CPAP at night , try to wear all night .  Change CPAP pressure to 8 to 12cm.  Work on healthy weight loss Do not drive if sleepy .  Melatonin At bedtime  for insomnia As needed     Follow up with Dr. Melvyn Novas  or Poetry Cerro NP in 6 months and As needed   Please contact office for sooner follow up if symptoms do not improve or worsen or seek emergency care            Rexene Edison, NP 05/15/2022

## 2022-05-31 DIAGNOSIS — M25812 Other specified joint disorders, left shoulder: Secondary | ICD-10-CM | POA: Diagnosis not present

## 2022-05-31 DIAGNOSIS — M25512 Pain in left shoulder: Secondary | ICD-10-CM | POA: Diagnosis not present

## 2022-05-31 NOTE — Progress Notes (Addendum)
COVID Vaccine Completed: yes x2  Date of COVID positive in last 90 days: no  PCP - Arlester Marker, MD Cardiologist - Jenkins Rouge, MD  Chest x-ray - 08/16/21 Epic EKG - 10/31/21 Epic Stress Test - n/a ECHO - 07/21/19 Epic  Cardiac Cath - n/a Pacemaker/ICD device last checked: n/a Spinal Cord Stimulator: n/a  Bowel Prep - no  Sleep Study - yes, positive CPAP -  yes every night  Fasting Blood Sugar - n/a Checks Blood Sugar _____ times a day  Blood Thinner Instructions: n/a Aspirin Instructions: Last Dose:  Activity level: Can go up a flight of stairs and perform activities of daily living without stopping and without symptoms of chest pain or shortness of breath.  Anesthesia review: atherosclerosis, HTN  Patient denies shortness of breath, fever, cough and chest pain at PAT appointment  Patient verbalized understanding of instructions that were given to them at the PAT appointment. Patient was also instructed that they will need to review over the PAT instructions again at home before surgery.

## 2022-05-31 NOTE — Progress Notes (Signed)
Please place orders for PAT appointment scheduled 06/01/22

## 2022-06-01 ENCOUNTER — Encounter (HOSPITAL_COMMUNITY)
Admission: RE | Admit: 2022-06-01 | Discharge: 2022-06-01 | Disposition: A | Discharge status: Home / Self Care | Payer: Medicare Other | Source: Ambulatory Visit | Attending: Orthopedic Surgery | Admitting: Orthopedic Surgery

## 2022-06-01 ENCOUNTER — Encounter (HOSPITAL_COMMUNITY): Payer: Self-pay

## 2022-06-01 VITALS — BP 144/90 | HR 82 | Temp 98.2°F | Resp 14 | Ht 59.75 in | Wt 153.4 lb

## 2022-06-01 DIAGNOSIS — M75102 Unspecified rotator cuff tear or rupture of left shoulder, not specified as traumatic: Secondary | ICD-10-CM | POA: Insufficient documentation

## 2022-06-01 DIAGNOSIS — I340 Nonrheumatic mitral (valve) insufficiency: Secondary | ICD-10-CM | POA: Diagnosis not present

## 2022-06-01 DIAGNOSIS — Z01812 Encounter for preprocedural laboratory examination: Secondary | ICD-10-CM | POA: Insufficient documentation

## 2022-06-01 DIAGNOSIS — I10 Essential (primary) hypertension: Secondary | ICD-10-CM | POA: Diagnosis not present

## 2022-06-01 DIAGNOSIS — R053 Chronic cough: Secondary | ICD-10-CM | POA: Diagnosis not present

## 2022-06-01 DIAGNOSIS — G4733 Obstructive sleep apnea (adult) (pediatric): Secondary | ICD-10-CM | POA: Insufficient documentation

## 2022-06-01 DIAGNOSIS — R011 Cardiac murmur, unspecified: Secondary | ICD-10-CM | POA: Insufficient documentation

## 2022-06-01 DIAGNOSIS — Z01818 Encounter for other preprocedural examination: Secondary | ICD-10-CM

## 2022-06-01 HISTORY — DX: Atherosclerotic heart disease of native coronary artery without angina pectoris: I25.10

## 2022-06-01 LAB — SURGICAL PCR SCREEN
MRSA, PCR: NEGATIVE
Staphylococcus aureus: NEGATIVE

## 2022-06-01 LAB — BASIC METABOLIC PANEL
Anion gap: 8 (ref 5–15)
BUN: 15 mg/dL (ref 8–23)
CO2: 28 mmol/L (ref 22–32)
Calcium: 9.6 mg/dL (ref 8.9–10.3)
Chloride: 104 mmol/L (ref 98–111)
Creatinine, Ser: 0.85 mg/dL (ref 0.44–1.00)
GFR, Estimated: >60 mL/min (ref >60)
Glucose, Bld: 130 mg/dL — ABNORMAL HIGH (ref 70–99)
Potassium: 4.2 mmol/L (ref 3.5–5.1)
Sodium: 140 mmol/L (ref 135–145)

## 2022-06-01 LAB — CBC
HCT: 40.9 % (ref 36.0–46.0)
Hemoglobin: 13.1 g/dL (ref 12.0–15.0)
MCH: 29.2 pg (ref 26.0–34.0)
MCHC: 32 g/dL (ref 30.0–36.0)
MCV: 91.3 fL (ref 80.0–100.0)
Platelets: 201 10*3/uL (ref 150–400)
RBC: 4.48 MIL/uL (ref 3.87–5.11)
RDW: 13.3 % (ref 11.5–15.5)
WBC: 7 10*3/uL (ref 4.0–10.5)
nRBC: 0 % (ref 0.0–0.2)

## 2022-06-01 NOTE — Patient Instructions (Addendum)
DUE TO COVID-19 ONLY TWO VISITORS  (aged 79 and older)  ARE ALLOWED TO COME WITH YOU AND STAY IN THE WAITING ROOM ONLY DURING PRE OP AND PROCEDURE.   **NO VISITORS ARE ALLOWED IN THE SHORT STAY AREA OR RECOVERY ROOM!!**    Your procedure is scheduled on: 06/08/22   Report to [facility] Main Entrance    Report to admitting at 9:45 AM   Call this number if you have problems the morning of surgery [phone]   Do not eat food :After Midnight.   After Midnight you may have the following liquids until 9:30 AM DAY OF SURGERY  Water Black Coffee (sugar ok, NO MILK/CREAM OR CREAMERS)  Tea (sugar ok, NO MILK/CREAM OR CREAMERS) regular and decaf                             Plain Jell-O (NO RED)                                           Fruit ices (not with fruit pulp, NO RED)                                     Popsicles (NO RED)                                                                  Juice: apple, WHITE grape, WHITE cranberry Sports drinks like Gatorade (NO RED) Clear broth(vegetable,chicken,beef)                  The day of surgery:  Drink ONE (1) Pre-Surgery Clear Ensure at 9:30 AM the morning of surgery. Drink in one sitting. Do not sip.  This drink was given to you during your hospital  pre-op appointment visit. Nothing else to drink after completing the  Pre-Surgery Clear Ensure.          If you have questions, please contact your surgeon's office.   FOLLOW BOWEL PREP AND ANY ADDITIONAL PRE OP INSTRUCTIONS YOU RECEIVED FROM YOUR SURGEON'S OFFICE!!!     Oral Hygiene is also important to reduce your risk of infection.                                    Remember - BRUSH YOUR TEETH THE MORNING OF SURGERY WITH YOUR REGULAR TOOTHPASTE   Take these medicines the morning of surgery with A SIP OF WATER: Tylenol, Amlodipine, Bupropion  Bring CPAP mask and tubing day of surgery.                              You may not have any metal on your body including hair  pins, jewelry, and body piercing             Do not wear make-up, lotions, powders, perfumes, or deodorant  Do not wear nail polish including gel and S&S, artificial/acrylic nails,  or any other type of covering on natural nails including finger and toenails. If you have artificial nails, gel coating, etc. that needs to be removed by a nail salon please have this removed prior to surgery or surgery may need to be canceled/ delayed if the surgeon/ anesthesia feels like they are unable to be safely monitored.   Do not shave  48 hours prior to surgery.    Do not bring valuables to the hospital. [place].  DO NOT [place]. PHARMACY WILL DISPENSE MEDICATIONS LISTED ON YOUR MEDICATION LIST TO YOU DURING YOUR ADMISSION [place]!    Patients discharged on the day of surgery will not be allowed to drive home.  Someone NEEDS to stay with you for the first 24 hours after anesthesia.              Please read over the following fact sheets you were given: IF YOU HAVE QUESTIONS ABOUT YOUR PRE-OP INSTRUCTIONS PLEASE CALL [facility] - Preparing for Surgery Before surgery, you can play an important role.  Because skin is not sterile, your skin needs to be as free of germs as possible.  You can reduce the number of germs on your skin by washing with CHG (chlorahexidine gluconate) soap before surgery.  CHG is an antiseptic cleaner which kills germs and bonds with the skin to continue killing germs even after washing. Please DO NOT use if you have an allergy to CHG or antibacterial soaps.  If your skin becomes reddened/irritated stop using the CHG and inform your nurse when you arrive at Short Stay. Do not shave (including legs and underarms) for at least 48 hours prior to the first CHG shower.  You may shave your face/neck.  Please follow these instructions carefully:  1.  Shower with CHG Soap  the night before surgery and the  morning of surgery.  2.  If you choose to wash your hair, wash your hair first as usual with your normal  shampoo.  3.  After you shampoo, rinse your hair and body thoroughly to remove the shampoo.                             4.  Use CHG as you would any other liquid soap.  You can apply chg directly to the skin and wash.  Gently with a scrungie or clean washcloth.  5.  Apply the CHG Soap to your body ONLY FROM THE NECK DOWN.   Do   not use on face/ open                           Wound or open sores. Avoid contact with eyes, ears mouth and   genitals (private parts).                       Wash face,  Genitals (private parts) with your normal soap.             6.  Wash thoroughly, paying special attention to the area where your    surgery  will be performed.  7.  Thoroughly rinse your body with warm water from the neck down.  8.  DO NOT shower/wash with your normal  soap after using and rinsing off the CHG Soap.                9.  Pat yourself dry with a clean towel.            10.  Wear clean pajamas.            11.  Place clean sheets on your bed the night of your first shower and do not  sleep with pets. Day of Surgery : Do not apply any lotions/deodorants the morning of surgery.  Please wear clean clothes to the hospital/surgery center.  FAILURE TO FOLLOW THESE INSTRUCTIONS MAY RESULT IN THE CANCELLATION OF YOUR SURGERY  PATIENT SIGNATURE_________________________________  NURSE SIGNATURE__________________________________  ________________________________________________________________________    Gloria Werner  An incentive spirometer is a tool that can help keep your lungs clear and active. This tool measures how well you are filling your lungs with each breath. Taking long deep breaths may help reverse or decrease the chance of developing breathing (pulmonary) problems (especially infection) following: A long period of time when you are unable  to move or be active. BEFORE THE PROCEDURE  If the spirometer includes an indicator to show your best effort, your nurse or respiratory therapist will set it to a desired goal. If possible, sit up straight or lean slightly forward. Try not to slouch. Hold the incentive spirometer in an upright position. INSTRUCTIONS FOR USE  Sit on the edge of your bed if possible, or sit up as far as you can in bed or on a chair. Hold the incentive spirometer in an upright position. Breathe out normally. Place the mouthpiece in your mouth and seal your lips tightly around it. Breathe in slowly and as deeply as possible, raising the piston or the ball toward the top of the column. Hold your breath for 3-5 seconds or for as long as possible. Allow the piston or ball to fall to the bottom of the column. Remove the mouthpiece from your mouth and breathe out normally. Rest for a few seconds and repeat Steps 1 through 7 at least 10 times every 1-2 hours when you are awake. Take your time and take a few normal breaths between deep breaths. The spirometer may include an indicator to show your best effort. Use the indicator as a goal to work toward during each repetition. After each set of 10 deep breaths, practice coughing to be sure your lungs are clear. If you have an incision (the cut made at the time of surgery), support your incision when coughing by placing a pillow or rolled up towels firmly against it. Once you are able to get out of bed, walk around indoors and cough well. You may stop using the incentive spirometer when instructed by your caregiver.  RISKS AND COMPLICATIONS Take your time so you do not get dizzy or light-headed. If you are in pain, you may need to take or ask for pain medication before doing incentive spirometry. It is harder to take a deep breath if you are having pain. AFTER USE Rest and breathe slowly and easily. It can be helpful to keep track of a log of your progress. Your caregiver  can provide you with a simple table to help with this. If you are using the spirometer at home, follow these instructions: [place] IF:  You are having difficultly using the spirometer. You have trouble using the spirometer as often as instructed. Your pain medication is not giving enough relief while  using the spirometer. You develop fever of 100.5 F (38.1 C) or higher. SEEK IMMEDIATE MEDICAL CARE IF:  You cough up bloody sputum that had not been present before. You develop fever of 102 F (38.9 C) or greater. You develop worsening pain at or near the incision site. MAKE SURE YOU:  Understand these instructions. Will watch your condition. Will get help right away if you are not doing well or get worse. Document Released: 04/23/2007 Document Revised: 03/04/2012 Document Reviewed: 06/24/2007 ExitCare Patient Information 2014 [place].   ________________________________________________________________________  [facility]- Preparing for Total Shoulder Arthroplasty    Before surgery, you can play an important role. Because skin is not sterile, your skin needs to be as free of germs as possible. You can reduce the number of germs on your skin by using the following products. Benzoyl Peroxide Gel Reduces the number of germs present on the skin Applied twice a day to shoulder area starting two days before surgery    ==================================================================  Please follow these instructions carefully:  BENZOYL PEROXIDE 5% GEL  Please do not use if you have an allergy to benzoyl peroxide.   If your skin becomes reddened/irritated stop using the benzoyl peroxide.  Starting two days before surgery, apply as follows: Apply benzoyl peroxide in the morning and at night. Apply after taking a shower. If you are not taking a shower clean entire shoulder front, back, and side along with the armpit with a clean wet washcloth.  Place a quarter-sized  dollop on your shoulder and rub in thoroughly, making sure to cover the front, back, and side of your shoulder, along with the armpit.   2 days before ____ AM   ____ PM              1 day before ____ AM   ____ PM                         Do this twice a day for two days.  (Last application is the night before surgery, AFTER using the CHG soap as described below).  Do NOT apply benzoyl peroxide gel on the day of surgery.

## 2022-06-02 NOTE — Anesthesia Preprocedure Evaluation (Addendum)
Anesthesia Evaluation  Patient identified by MRN, date of birth, ID band Patient awake    Reviewed: Allergy & Precautions, NPO status , Patient's Chart, lab work & pertinent test results  History of Anesthesia Complications (+) PONV and history of anesthetic complications  Airway Mallampati: II  TM Distance: >3 FB Neck ROM: Full    Dental no notable dental hx. (+) Teeth Intact, Dental Advisory Given   Pulmonary sleep apnea and Continuous Positive Airway Pressure Ventilation ,    Pulmonary exam normal breath sounds clear to auscultation       Cardiovascular hypertension, Normal cardiovascular exam Rhythm:Regular Rate:Normal  06/2019 ECHO 1. The left ventricle has normal systolic function with an ejection  fraction of 60-65%. The cavity size was normal. Left ventricular diastolic  Doppler parameters are consistent with impaired relaxation.  2. The interatrial septum appears to be lipomatous.  3. The right ventricle has normal systolic function. The cavity was  normal.  4. The mitral valve is abnormal. There is mild mitral annular  calcification present.  5. The tricuspid valve is grossly normal.  6. The aortic valve is tricuspid. Mild thickening of the aortic valve. No  stenosis of the aortic valve.  7. The aorta is normal in size and structure.  8. Normal LV systolic function; mild diastolic dysfunction; mild MR and  TR.    Neuro/Psych PSYCHIATRIC DISORDERS Anxiety Depression negative neurological ROS     GI/Hepatic Neg liver ROS, GERD  ,  Endo/Other  negative endocrine ROS  Renal/GU Lab Results      Component                Value               Date                      CREATININE               0.85                06/01/2022                BUN                      15                  06/01/2022                NA                       140                 06/01/2022                K                        4.2                  06/01/2022                CL                       104                 06/01/2022                CO2  28                  06/01/2022                Musculoskeletal  (+) Arthritis ,   Abdominal (+) + obese (BMI 30.21),   Peds  Hematology Lab Results      Component                Value               Date                      WBC                      7.0                 06/01/2022                HGB                      13.1                06/01/2022                HCT                      40.9                06/01/2022                MCV                      91.3                06/01/2022                PLT                      201                 06/01/2022              Anesthesia Other Findings ALL: lexapro AcE inhibitors  Reproductive/Obstetrics                           Anesthesia Physical Anesthesia Plan  ASA: 3  Anesthesia Plan: General   Post-op Pain Management: Regional block*   Induction: Intravenous  PONV Risk Score and Plan: Treatment may vary due to age or medical condition  Airway Management Planned: Oral ETT  Additional Equipment: None  Intra-op Plan:   Post-operative Plan: Extubation in OR  Informed Consent:     Dental advisory given  Plan Discussed with:   Anesthesia Plan Comments: (See APP note by Durel Salts, FNP   GA w L ISB)       Anesthesia Quick Evaluation

## 2022-06-02 NOTE — Progress Notes (Signed)
Anesthesia Chart Review:   Case: [id number] Date/Time: 06/08/22 1215   Procedure: REVERSE SHOULDER ARTHROPLASTY (Left: Shoulder)   Anesthesia type: General   Pre-op diagnosis: Left shoulder rotator cuff tear arthropathy   Location: [facility] ROOM 06 / [facility]   Surgeons: Justice Britain, MD       DISCUSSION: Pt is 81 years old with hx HTN, heart murmur (documented 1/6 systolic murmur at last pulmonology visit 05/15/22; last cardiology note 2021 documents 5/6 SM; mild MR on 2020 echo), OSA, chronic cough  S/p THA 11/04/21. Tolerated without anesthesia complications.   VS: BP (!) 144/90   Pulse 82   Temp 36.8 C (Oral)   Resp 14   Ht 4' 11.75" (1.518 m)   Wt 69.6 kg   LMP 12/25/1986 (Approximate)   SpO2 100%   BMI 30.21 kg/m   PROVIDERS: - PCP is Rudd, Lillette Boxer, MD - Used to see cardiologist Jenkins Rouge, MD for heart murmur. Last office visit 10/07/20 with Cadence Kathlen Mody, PA. 1 year f/u recommended but has not happened - Pulmonologist is Christinia Gully, MD who sees pt for chronic cough and OSA. Last office visit 05/15/22 with Rexene Edison, NP   LABS: Labs reviewed: Acceptable for surgery. (all labs ordered are listed, but only abnormal results are displayed)  Labs Reviewed  BASIC METABOLIC PANEL - Abnormal; Notable for the following components:      Result Value   Glucose, Bld 130 (*)    All other components within normal limits  SURGICAL PCR SCREEN  CBC    PROCEDURES: PFTs 02/14/22:  - Good patient effort. The results of this test meets ATS standards for acceptability and repeatability. Hgb default value of 13.4 used - The FVC, FEV1, FEV1/FVC ratio and FEF25-75% are within normal limits.  - Following administration of bronchodilators, there is no significant response. The diffusing capacity is high for the measured volume. - Pulmonary Function Diagnosis: Minimally increased Diffusion - this study is probably wnl   IMAGES: CXR 08/16/21:  - No active cardiopulmonary  disease.  EKG 10/31/21: NSR. Non-specific intra-ventricular conduction delay   CV: Echo 07/21/2019 1. The left ventricle has normal systolic function with an ejection fraction of 60-65%. The cavity size was normal. Left ventricular diastolic Doppler parameters are consistent with impaired relaxation.  2. The interatrial septum appears to be lipomatous.  3. The right ventricle has normal systolic function. The cavity was normal.  4. The mitral valve is abnormal. There is mild mitral annular calcification present.  5. The tricuspid valve is grossly normal.  6. The aortic valve is tricuspid. Mild thickening of the aortic valve. No stenosis of the aortic valve.  7. The aorta is normal in size and structure.  8. Normal LV systolic function; mild diastolic dysfunction; mild MR and TR.    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    Past Surgical History:  Procedure Laterality Date   ABDOMINAL HYSTERECTOMY  1988   secondary to prolapse  BLADDER SUSPENSION     A-P with Hyst   CATARACT EXTRACTION Bilateral    INSERTION OF MESH  10/16/2018   Procedure: INSERTION OF MESH;  Surgeon: Ardis Hughs, MD;  Location: [facility];  Service: Urology;;   NASAL SINUS SURGERY  1970   ROBOTIC ASSISTED LAPAROSCOPIC SACROCOLPOPEXY N/A 10/16/2018   Procedure: XI ROBOTIC ASSISTED LAPAROSCOPIC SACROCOLPOPEXY;  Surgeon: Ardis Hughs, MD;  Location: [facility];  Service: Urology;  Laterality: N/A;   TONSILLECTOMY     TOTAL HIP ARTHROPLASTY Right 11/04/2021    Procedure: RIGHT TOTAL HIP ARTHROPLASTY ANTERIOR APPROACH;  Surgeon: Mcarthur Rossetti, MD;  Location: [facility];  Service: Orthopedics;  Laterality: Right;    MEDICATIONS:  acetaminophen (TYLENOL) 500 MG tablet   albuterol (VENTOLIN HFA) 108 (90 Base) MCG/ACT inhaler   amLODipine (NORVASC) 5 MG tablet   benzonatate (TESSALON) 200 MG capsule   Biotin 10000 MCG TABS   buPROPion (WELLBUTRIN) 75 MG tablet   calcium carbonate (OSCAL) 1500 (600 Ca) MG TABS tablet   Carboxymethylcellul-Glycerin (LUBRICATING EYE DROPS OP)   dicyclomine (BENTYL) 10 MG capsule   Docusate Calcium (STOOL SOFTENER PO)   meloxicam (MOBIC) 7.5 MG tablet   polyethylene glycol (MIRALAX / GLYCOLAX) 17 g packet   pyridOXINE (VITAMIN B-6) 100 MG tablet   rosuvastatin (CRESTOR) 5 MG tablet   saccharomyces boulardii (FLORASTOR) 250 MG capsule   triamcinolone (NASACORT ALLERGY 24HR) 55 MCG/ACT AERO nasal inhaler   vitamin C (ASCORBIC ACID) 500 MG tablet   Vitamin D, Ergocalciferol, (DRISDOL) 1.25 MG (50000 UNIT) CAPS capsule   No current facility-administered medications for this encounter.    If no changes, I anticipate pt can proceed with surgery as scheduled.   Willeen Cass, PhD, FNP-BC [facility]/Anesthesiology Phone: [phone] 06/02/2022 11:15 AM

## 2022-06-08 ENCOUNTER — Other Ambulatory Visit: Payer: Self-pay

## 2022-06-08 ENCOUNTER — Ambulatory Visit (HOSPITAL_COMMUNITY): Payer: Medicare Other | Admitting: Emergency Medicine

## 2022-06-08 ENCOUNTER — Encounter (HOSPITAL_COMMUNITY): Payer: Self-pay | Admitting: Orthopedic Surgery

## 2022-06-08 ENCOUNTER — Encounter (HOSPITAL_COMMUNITY)
Admission: RE | Disposition: A | Discharge status: Home / Self Care | Payer: Self-pay | Source: Ambulatory Visit | Attending: Orthopedic Surgery

## 2022-06-08 ENCOUNTER — Ambulatory Visit (HOSPITAL_BASED_OUTPATIENT_CLINIC_OR_DEPARTMENT_OTHER): Payer: Medicare Other | Admitting: Anesthesiology

## 2022-06-08 ENCOUNTER — Ambulatory Visit (HOSPITAL_COMMUNITY)
Admission: RE | Admit: 2022-06-08 | Discharge: 2022-06-08 | Disposition: A | Discharge status: Home / Self Care | Payer: Medicare Other | Source: Ambulatory Visit | Attending: Orthopedic Surgery | Admitting: Orthopedic Surgery

## 2022-06-08 DIAGNOSIS — M75102 Unspecified rotator cuff tear or rupture of left shoulder, not specified as traumatic: Secondary | ICD-10-CM

## 2022-06-08 DIAGNOSIS — G4730 Sleep apnea, unspecified: Secondary | ICD-10-CM | POA: Insufficient documentation

## 2022-06-08 DIAGNOSIS — K219 Gastro-esophageal reflux disease without esophagitis: Secondary | ICD-10-CM | POA: Diagnosis not present

## 2022-06-08 DIAGNOSIS — M19012 Primary osteoarthritis, left shoulder: Secondary | ICD-10-CM | POA: Diagnosis not present

## 2022-06-08 DIAGNOSIS — I10 Essential (primary) hypertension: Secondary | ICD-10-CM | POA: Diagnosis not present

## 2022-06-08 DIAGNOSIS — E669 Obesity, unspecified: Secondary | ICD-10-CM | POA: Insufficient documentation

## 2022-06-08 DIAGNOSIS — F418 Other specified anxiety disorders: Secondary | ICD-10-CM | POA: Diagnosis not present

## 2022-06-08 DIAGNOSIS — M12812 Other specific arthropathies, not elsewhere classified, left shoulder: Secondary | ICD-10-CM | POA: Diagnosis not present

## 2022-06-08 DIAGNOSIS — F32A Depression, unspecified: Secondary | ICD-10-CM | POA: Diagnosis not present

## 2022-06-08 DIAGNOSIS — M1990 Unspecified osteoarthritis, unspecified site: Secondary | ICD-10-CM | POA: Diagnosis not present

## 2022-06-08 DIAGNOSIS — G4733 Obstructive sleep apnea (adult) (pediatric): Secondary | ICD-10-CM | POA: Diagnosis not present

## 2022-06-08 DIAGNOSIS — F419 Anxiety disorder, unspecified: Secondary | ICD-10-CM | POA: Insufficient documentation

## 2022-06-08 DIAGNOSIS — Z6830 Body mass index (BMI) 30.0-30.9, adult: Secondary | ICD-10-CM | POA: Insufficient documentation

## 2022-06-08 DIAGNOSIS — Z9989 Dependence on other enabling machines and devices: Secondary | ICD-10-CM | POA: Diagnosis not present

## 2022-06-08 DIAGNOSIS — G8918 Other acute postprocedural pain: Secondary | ICD-10-CM | POA: Diagnosis not present

## 2022-06-08 DIAGNOSIS — G8929 Other chronic pain: Secondary | ICD-10-CM

## 2022-06-08 DIAGNOSIS — Z01818 Encounter for other preprocedural examination: Secondary | ICD-10-CM

## 2022-06-08 HISTORY — PX: REVERSE SHOULDER ARTHROPLASTY: SHX5054

## 2022-06-08 SURGERY — ARTHROPLASTY, SHOULDER, TOTAL, REVERSE
Anesthesia: General | Site: Shoulder | Laterality: Left

## 2022-06-08 MED ORDER — LACTATED RINGERS IV SOLN: INTRAVENOUS | Status: DC

## 2022-06-08 MED ORDER — 0.9 % SODIUM CHLORIDE (POUR BTL) OPTIME
TOPICAL | Status: DC | PRN
  Administered 2022-06-08: 1000 mL

## 2022-06-08 MED ORDER — FENTANYL CITRATE PF 50 MCG/ML IJ SOSY
50.0000 ug | PREFILLED_SYRINGE | INTRAMUSCULAR | Status: DC
  Administered 2022-06-08: 50 ug via INTRAVENOUS
  Filled 2022-06-08: qty 2

## 2022-06-08 MED ORDER — VANCOMYCIN HCL 1000 MG IV SOLR
INTRAVENOUS | Status: AC
Start: 2022-06-08 — End: ?
  Filled 2022-06-08: qty 20

## 2022-06-08 MED ORDER — ROCURONIUM BROMIDE 100 MG/10ML IV SOLN
INTRAVENOUS | Status: DC | PRN
  Administered 2022-06-08: 70 mg via INTRAVENOUS

## 2022-06-08 MED ORDER — SUGAMMADEX SODIUM 200 MG/2ML IV SOLN
INTRAVENOUS | Status: DC | PRN
  Administered 2022-06-08: 200 mg via INTRAVENOUS

## 2022-06-08 MED ORDER — PHENYLEPHRINE HCL-NACL 20-0.9 MG/250ML-% IV SOLN
INTRAVENOUS | Status: AC
  Filled 2022-06-08: qty 250

## 2022-06-08 MED ORDER — PROPOFOL 10 MG/ML IV BOLUS
INTRAVENOUS | Status: AC
  Filled 2022-06-08: qty 20

## 2022-06-08 MED ORDER — ROCURONIUM BROMIDE 10 MG/ML (PF) SYRINGE
PREFILLED_SYRINGE | INTRAVENOUS | Status: AC
  Filled 2022-06-08: qty 10

## 2022-06-08 MED ORDER — OXYCODONE-ACETAMINOPHEN 5-325 MG PO TABS: 1.0000 | ORAL_TABLET | ORAL | 0 refills | Status: DC | PRN

## 2022-06-08 MED ORDER — ONDANSETRON HCL 4 MG/2ML IJ SOLN
INTRAMUSCULAR | Status: DC | PRN
  Administered 2022-06-08: 4 mg via INTRAVENOUS

## 2022-06-08 MED ORDER — PHENYLEPHRINE HCL-NACL 20-0.9 MG/250ML-% IV SOLN
INTRAVENOUS | Status: DC | PRN
  Administered 2022-06-08 (×2): 25 ug/min via INTRAVENOUS
  Administered 2022-06-08 (×2): 80 ug via INTRAVENOUS

## 2022-06-08 MED ORDER — BUPIVACAINE LIPOSOME 1.3 % IJ SUSP
INTRAMUSCULAR | Status: DC | PRN
  Administered 2022-06-08: 10 mL via PERINEURAL

## 2022-06-08 MED ORDER — LACTATED RINGERS IV BOLUS
250.0000 mL | Freq: Once | INTRAVENOUS | Status: AC
  Administered 2022-06-08: 250 mL via INTRAVENOUS

## 2022-06-08 MED ORDER — MIDAZOLAM HCL 2 MG/2ML IJ SOLN
INTRAMUSCULAR | Status: AC
  Filled 2022-06-08: qty 2

## 2022-06-08 MED ORDER — CHLORHEXIDINE GLUCONATE 0.12 % MT SOLN
15.0000 mL | Freq: Once | OROMUCOSAL | Status: AC
  Administered 2022-06-08: 15 mL via OROMUCOSAL

## 2022-06-08 MED ORDER — CYCLOBENZAPRINE HCL 10 MG PO TABS: 10.0000 mg | ORAL_TABLET | Freq: Three times a day (TID) | ORAL | 1 refills | Status: DC | PRN

## 2022-06-08 MED ORDER — ONDANSETRON HCL 4 MG/2ML IJ SOLN
INTRAMUSCULAR | Status: AC
  Filled 2022-06-08: qty 2

## 2022-06-08 MED ORDER — DEXAMETHASONE SODIUM PHOSPHATE 10 MG/ML IJ SOLN
INTRAMUSCULAR | Status: AC
  Filled 2022-06-08: qty 1

## 2022-06-08 MED ORDER — CEFAZOLIN SODIUM-DEXTROSE 2-4 GM/100ML-% IV SOLN
2.0000 g | INTRAVENOUS | Status: AC
  Administered 2022-06-08: 2 g via INTRAVENOUS
  Filled 2022-06-08: qty 100

## 2022-06-08 MED ORDER — MELOXICAM 7.5 MG PO TABS: 7.5000 mg | ORAL_TABLET | Freq: Every day | ORAL | 0 refills | Status: DC

## 2022-06-08 MED ORDER — LACTATED RINGERS IV BOLUS
500.0000 mL | Freq: Once | INTRAVENOUS | Status: AC
  Administered 2022-06-08: 500 mL via INTRAVENOUS

## 2022-06-08 MED ORDER — LIDOCAINE HCL (PF) 2 % IJ SOLN
INTRAMUSCULAR | Status: AC
  Filled 2022-06-08: qty 5

## 2022-06-08 MED ORDER — FENTANYL CITRATE (PF) 100 MCG/2ML IJ SOLN
INTRAMUSCULAR | Status: DC | PRN
Start: 2022-06-08 — End: 2022-06-08
  Administered 2022-06-08: 50 ug via INTRAVENOUS

## 2022-06-08 MED ORDER — MIDAZOLAM HCL 5 MG/5ML IJ SOLN
INTRAMUSCULAR | Status: DC | PRN
  Administered 2022-06-08: 2 mg via INTRAVENOUS

## 2022-06-08 MED ORDER — TRANEXAMIC ACID-NACL 1000-0.7 MG/100ML-% IV SOLN
1000.0000 mg | INTRAVENOUS | Status: AC
  Administered 2022-06-08: 1000 mg via INTRAVENOUS
  Filled 2022-06-08: qty 100

## 2022-06-08 MED ORDER — ONDANSETRON HCL 4 MG PO TABS: 4.0000 mg | ORAL_TABLET | Freq: Three times a day (TID) | ORAL | 0 refills | Status: DC | PRN

## 2022-06-08 MED ORDER — DEXAMETHASONE SODIUM PHOSPHATE 10 MG/ML IJ SOLN
INTRAMUSCULAR | Status: DC | PRN
  Administered 2022-06-08: 10 mg via INTRAVENOUS

## 2022-06-08 MED ORDER — BUPIVACAINE HCL (PF) 0.5 % IJ SOLN
INTRAMUSCULAR | Status: DC | PRN
  Administered 2022-06-08: 14 mL via PERINEURAL

## 2022-06-08 MED ORDER — VANCOMYCIN HCL 1000 MG IV SOLR
INTRAVENOUS | Status: DC | PRN
  Administered 2022-06-08: 1000 mg via TOPICAL

## 2022-06-08 MED ORDER — STERILE WATER FOR IRRIGATION IR SOLN
Status: DC | PRN
  Administered 2022-06-08: 2000 mL

## 2022-06-08 MED ORDER — PROPOFOL 10 MG/ML IV BOLUS
INTRAVENOUS | Status: DC | PRN
  Administered 2022-06-08: 120 mg via INTRAVENOUS

## 2022-06-08 MED ORDER — LIDOCAINE HCL (CARDIAC) PF 100 MG/5ML IV SOSY
PREFILLED_SYRINGE | INTRAVENOUS | Status: DC | PRN
  Administered 2022-06-08: 100 mg via INTRAVENOUS

## 2022-06-08 MED ORDER — FENTANYL CITRATE (PF) 100 MCG/2ML IJ SOLN
INTRAMUSCULAR | Status: AC
  Filled 2022-06-08: qty 2

## 2022-06-08 MED ORDER — ORAL CARE MOUTH RINSE: 15.0000 mL | Freq: Once | OROMUCOSAL | Status: AC

## 2022-06-08 SURGICAL SUPPLY — 76 items
ADH SKN CLS APL DERMABOND .7 (GAUZE/BANDAGES/DRESSINGS) ×1
AID PSTN UNV HD RSTRNT DISP (MISCELLANEOUS) ×1
BAG COUNTER SPONGE SURGICOUNT (BAG) ×1 IMPLANT
BAG SPEC THK2 15X12 ZIP CLS (MISCELLANEOUS) ×1
BAG SPNG CNTER NS LX DISP (BAG) ×1
BAG ZIPLOCK 12X15 (MISCELLANEOUS) ×2 IMPLANT
BLADE SAW SGTL 83.5X18.5 (BLADE) ×2 IMPLANT
BNDG COHESIVE 4X5 TAN ST LF (GAUZE/BANDAGES/DRESSINGS) ×2 IMPLANT
BSPLAT GLND +2X24 MDLR (Joint) ×1 IMPLANT
COOLER ICEMAN CLASSIC (MISCELLANEOUS) ×2 IMPLANT
COVER BACK TABLE 60X90IN (DRAPES) ×2 IMPLANT
COVER SURGICAL LIGHT HANDLE (MISCELLANEOUS) ×2 IMPLANT
CUP SUT UNIV REVERS 36 NEUTRAL (Cup) ×1 IMPLANT
DERMABOND ADVANCED (GAUZE/BANDAGES/DRESSINGS) ×1
DERMABOND ADVANCED .7 DNX12 (GAUZE/BANDAGES/DRESSINGS) ×1 IMPLANT
DRAPE INCISE IOBAN 66X45 STRL (DRAPES) IMPLANT
DRAPE ORTHO SPLIT 77X108 STRL (DRAPES) ×4
DRAPE SHEET LG 3/4 BI-LAMINATE (DRAPES) ×2 IMPLANT
DRAPE SURG 17X11 SM STRL (DRAPES) ×2 IMPLANT
DRAPE SURG ORHT 6 SPLT 77X108 (DRAPES) ×2 IMPLANT
DRAPE TOP 10253 STERILE (DRAPES) ×2 IMPLANT
DRAPE U-SHAPE 47X51 STRL (DRAPES) ×2 IMPLANT
DRESSING AQUACEL AG SP 3.5X6 (GAUZE/BANDAGES/DRESSINGS) ×1 IMPLANT
DRSG AQUACEL AG ADV 3.5X 6 (GAUZE/BANDAGES/DRESSINGS) ×1 IMPLANT
DRSG AQUACEL AG ADV 3.5X10 (GAUZE/BANDAGES/DRESSINGS) IMPLANT
DRSG AQUACEL AG SP 3.5X6 (GAUZE/BANDAGES/DRESSINGS) ×2
DRSG TEGADERM 8X12 (GAUZE/BANDAGES/DRESSINGS) ×2 IMPLANT
DURAPREP 26ML APPLICATOR (WOUND CARE) ×2 IMPLANT
ELECT BLADE TIP CTD 4 INCH (ELECTRODE) ×2 IMPLANT
ELECT PENCIL ROCKER SW 15FT (MISCELLANEOUS) ×2 IMPLANT
ELECT REM PT RETURN 15FT ADLT (MISCELLANEOUS) ×2 IMPLANT
FACESHIELD WRAPAROUND (MASK) ×8 IMPLANT
FACESHIELD WRAPAROUND OR TEAM (MASK) ×4 IMPLANT
GLENOID UNI REV MOD 24 +2 LAT (Joint) ×1 IMPLANT
GLENOSPHERE 36 +4 LAT/24 (Joint) ×1 IMPLANT
GLOVE BIO SURGEON STRL SZ7.5 (GLOVE) ×2 IMPLANT
GLOVE BIO SURGEON STRL SZ8 (GLOVE) ×2 IMPLANT
GLOVE SS BIOGEL STRL SZ 7 (GLOVE) ×1 IMPLANT
GLOVE SS BIOGEL STRL SZ 7.5 (GLOVE) ×1 IMPLANT
GLOVE SUPERSENSE BIOGEL SZ 7 (GLOVE) ×1
GLOVE SUPERSENSE BIOGEL SZ 7.5 (GLOVE) ×1
GOWN STRL REIN XL XLG (GOWN DISPOSABLE) ×4 IMPLANT
KIT BASIN OR (CUSTOM PROCEDURE TRAY) ×2 IMPLANT
KIT TURNOVER KIT A (KITS) ×1 IMPLANT
LINER HUMERAL 36 +3MM SM (Shoulder) ×1 IMPLANT
MANIFOLD NEPTUNE II (INSTRUMENTS) ×2 IMPLANT
NDL TAPERED W/ NITINOL LOOP (MISCELLANEOUS) ×1 IMPLANT
NEEDLE TAPERED W/ NITINOL LOOP (MISCELLANEOUS) ×2 IMPLANT
NS IRRIG 1000ML POUR BTL (IV SOLUTION) ×2 IMPLANT
PACK SHOULDER (CUSTOM PROCEDURE TRAY) ×2 IMPLANT
PAD ARMBOARD 7.5X6 YLW CONV (MISCELLANEOUS) ×2 IMPLANT
PAD COLD SHLDR WRAP-ON (PAD) ×2 IMPLANT
PIN NITINOL TARGETER 2.8 (PIN) IMPLANT
PIN SET MODULAR GLENOID SYSTEM (PIN) IMPLANT
RESTRAINT HEAD UNIVERSAL NS (MISCELLANEOUS) ×2 IMPLANT
SCREW CENTRAL MOD 30MM (Screw) ×1 IMPLANT
SCREW PERI LOCK 5.5X16 (Screw) ×2 IMPLANT
SCREW PERI LOCK 5.5X32 (Screw) ×2 IMPLANT
SLING ARM FOAM STRAP LRG (SOFTGOODS) ×1 IMPLANT
SLING ARM FOAM STRAP MED (SOFTGOODS) IMPLANT
SPONGE T-LAP 4X18 ~~LOC~~+RFID (SPONGE) ×2 IMPLANT
STEM HUMERAL UNI REVERS SZ9 (Stem) ×1 IMPLANT
SUCTION FRAZIER HANDLE 12FR (TUBING) ×2
SUCTION TUBE FRAZIER 12FR DISP (TUBING) ×1 IMPLANT
SUT FIBERWIRE #2 38 T-5 BLUE (SUTURE)
SUT MNCRL AB 3-0 PS2 18 (SUTURE) ×2 IMPLANT
SUT MON AB 2-0 CT1 36 (SUTURE) ×2 IMPLANT
SUT VIC AB 1 CT1 36 (SUTURE) ×2 IMPLANT
SUTURE FIBERWR #2 38 T-5 BLUE (SUTURE) IMPLANT
SUTURE TAPE 1.3 40 TPR END (SUTURE) ×2 IMPLANT
SUTURETAPE 1.3 40 TPR END (SUTURE) ×4
TOWEL OR 17X26 10 PK STRL BLUE (TOWEL DISPOSABLE) ×2 IMPLANT
TOWEL OR NON WOVEN STRL DISP B (DISPOSABLE) ×2 IMPLANT
TUBE SUCTION HIGH CAP CLEAR NV (SUCTIONS) ×2 IMPLANT
WATER STERILE IRR 1000ML POUR (IV SOLUTION) ×4 IMPLANT
YANKAUER SUCT BULB TIP 10FT TU (MISCELLANEOUS) IMPLANT

## 2022-06-08 NOTE — Anesthesia Procedure Notes (Signed)
Procedure Name: Intubation Date/Time: 06/08/2022 12:37 PM  Performed by: Archita Lomeli, Forest Gleason, CRNAPre-anesthesia Checklist: Patient identified, Emergency Drugs available, Suction available, Patient being monitored and Timeout performed Patient Re-evaluated:Patient Re-evaluated prior to induction Oxygen Delivery Method: Circle system utilized Preoxygenation: Pre-oxygenation with 100% oxygen Induction Type: IV induction Ventilation: Mask ventilation without difficulty Laryngoscope Size: Mac and 4 Grade View: Grade I Tube type: Oral Tube size: 7.0 mm Number of attempts: 1 Airway Equipment and Method: Stylet Placement Confirmation: ETT inserted through vocal cords under direct vision, positive ETCO2, CO2 detector and breath sounds checked- equal and bilateral Secured at: 20 cm Tube secured with: Tape Dental Injury: Teeth and Oropharynx as per pre-operative assessment

## 2022-06-08 NOTE — Care Plan (Signed)
Ortho Bundle Case Management Note  Patient Details  Name: Gloria Werner MRN: [id number] Date of Birth: Feb 07, 1941                  L Rev TSA on 06/08/22. DCP: Home with husband and daughter. DME: Sling and CTU provided by hospital. PT: EO   DME Arranged:  N/A DME Agency:     HH Arranged:    [place] Agency:     Additional Comments: Please contact me with any questions of if this plan should need to change.  Marianne Sofia, RN,CCM [facility]  [phone] 06/08/2022, 10:23 AM

## 2022-06-08 NOTE — Anesthesia Postprocedure Evaluation (Signed)
Anesthesia Post Note  Patient: Gloria Werner  Procedure(s) Performed: REVERSE SHOULDER ARTHROPLASTY (Left: Shoulder)     Patient location during evaluation: PACU Anesthesia Type: General and Regional Level of consciousness: awake and alert Pain management: pain level controlled Vital Signs Assessment: post-procedure vital signs reviewed and stable Respiratory status: spontaneous breathing, nonlabored ventilation, respiratory function stable and patient connected to nasal cannula oxygen Cardiovascular status: blood pressure returned to baseline and stable Postop Assessment: no apparent nausea or vomiting Anesthetic complications: no   No notable events documented.  Last Vitals:  Vitals:   06/08/22 1150 06/08/22 1420  BP: (!) 144/127 (!) 153/81  Pulse: 79 97  Resp: 15 15  Temp:  (!) 36.4 C  SpO2: 97% 95%    Last Pain:  Vitals:   06/08/22 1420  TempSrc:   PainSc: 0-No pain                 Barnet [place]

## 2022-06-08 NOTE — H&P (Signed)
Gloria Werner    Chief Complaint: Left shoulder rotator cuff tear arthropathy HPI: The patient is a 81 y.o. female with chronic and progressive increasing left shoulder pain related to severe rotator cuff tear arthropathy.  Due to her increasing pain and functional limitations and failure to respond to prolonged attempts at conservative management, she is brought to the operating room at this time for planned left shoulder reverse arthroplasty  Past Medical History:  Diagnosis Date   Allergic rhinitis due to pollen    Cancer [facility])    skin   Cataract    NS OU   Complication of anesthesia    Coronary artery disease    Cough    De Quervain's syndrome (tenosynovitis) 07/03/2018   Gastroesophageal reflux disease without esophagitis    Heart murmur    Hypertension    Irritable bowel syndrome    Lichen simplex chronicus 09/2004   PONV (postoperative nausea and vomiting)    Unilateral primary osteoarthritis, 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      Past Surgical History:  Procedure Laterality Date   ABDOMINAL HYSTERECTOMY  1988   secondary to prolapse   BLADDER SUSPENSION     A-P with Hyst   CATARACT EXTRACTION Bilateral    INSERTION OF MESH  10/16/2018   Procedure: INSERTION OF MESH;  Surgeon: Ardis Hughs, MD;  Location: [facility];  Service: Urology;;   NASAL SINUS SURGERY  1970   ROBOTIC ASSISTED LAPAROSCOPIC SACROCOLPOPEXY N/A 10/16/2018   Procedure: XI ROBOTIC ASSISTED LAPAROSCOPIC SACROCOLPOPEXY;  Surgeon: Ardis Hughs, MD;  Location: [facility];  Service: Urology;  Laterality: N/A;   TONSILLECTOMY     TOTAL HIP ARTHROPLASTY Right 11/04/2021   Procedure: RIGHT TOTAL HIP ARTHROPLASTY ANTERIOR APPROACH;  Surgeon: Mcarthur Rossetti, MD;  Location: [facility];  Service: Orthopedics;  Laterality: Right;    Family History  Problem Relation Age of Onset   Emphysema Mother    Lymphoma Mother    Asthma Mother    Cancer Father 59       lung cancer   Coronary artery disease  Brother    Cancer Brother        breast cancer   Breast cancer Brother    Cancer Brother        lung cancer   Diabetes Neg Hx    Hypertension Neg Hx    Colon cancer Neg Hx    Esophageal cancer Neg Hx    Rectal cancer Neg Hx    Stomach cancer Neg Hx     Social History:  reports that she has never smoked. She has never used smokeless tobacco. She reports that she does not drink alcohol and does not use drugs.  BMI: Estimated body mass index is 30.21 kg/m as calculated from the following:   Height as of this encounter: 4' 11.75" (1.518 m).   Weight as of this encounter: 69.6 kg.  Lab Results  Component Value Date   ALBUMIN 4.4 02/17/2022   Diabetes: Patient does not have a diagnosis of diabetes. Lab Results  Component Value Date   HGBA1C 5.6 02/17/2022     Smoking Status:   reports that she has never smoked. She has never used smokeless tobacco.     Medications Prior to Admission  Medication Sig Dispense Refill   acetaminophen (TYLENOL) 500 MG tablet Take 500-1,000 mg by mouth at bedtime as needed for moderate pain.     amLODipine (NORVASC) 5 MG tablet Take 1 tablet (5 mg total) by mouth  daily. 90 tablet 1   Biotin 10000 MCG TABS Take 10,000 mcg by mouth daily.     buPROPion (WELLBUTRIN) 75 MG tablet Take 1 tablet (75 mg total) by mouth daily. 90 tablet 3   calcium carbonate (OSCAL) 1500 (600 Ca) MG TABS tablet Take 600 mg of elemental calcium by mouth daily with breakfast.     Carboxymethylcellul-Glycerin (LUBRICATING EYE DROPS OP) Place 1 drop into both eyes daily as needed (dry eyes). Visine     dicyclomine (BENTYL) 10 MG capsule Take 1 capsule (10 mg total) by mouth 4 (four) times daily -  before meals and at bedtime. (Patient taking differently: Take 10 mg by mouth 3 (three) times daily as needed for spasms.) 120 capsule 0   Docusate Calcium (STOOL SOFTENER PO) Take 1 tablet by mouth daily.     polyethylene glycol (MIRALAX / GLYCOLAX) 17 g packet Take 17 g by mouth  daily. Takes an extra scoop in the evening as needed for moderate-severe constipation.     pyridOXINE (VITAMIN B-6) 100 MG tablet Take 100 mg by mouth daily.     rosuvastatin (CRESTOR) 5 MG tablet Take 1 tablet (5 mg total) by mouth 3 (three) times a week. (Patient taking differently: Take 5 mg by mouth every Monday, Wednesday, and Friday.) 36 tablet 1   saccharomyces boulardii (FLORASTOR) 250 MG capsule Take 2 capsules (500 mg total) by mouth 2 (two) times daily. (Patient taking differently: Take 250 mg by mouth daily.) 120 capsule 3   triamcinolone (NASACORT ALLERGY 24HR) 55 MCG/ACT AERO nasal inhaler Place 1 spray into the nose daily.     vitamin C (ASCORBIC ACID) 500 MG tablet Take 500 mg by mouth daily.     Vitamin D, Ergocalciferol, (DRISDOL) 1.25 MG (50000 UNIT) CAPS capsule Take 1 capsule (50,000 Units total) by mouth every 30 (thirty) days. 3 capsule 3   albuterol (VENTOLIN HFA) 108 (90 Base) MCG/ACT inhaler Inhale 1-2 puffs into the lungs every 6 (six) hours as needed. (Patient not taking: Reported on 05/31/2022) 8 g 2   benzonatate (TESSALON) 200 MG capsule Take 1 capsule (200 mg total) by mouth 3 (three) times daily as needed for cough. (Patient not taking: Reported on 05/31/2022) 45 capsule 3   meloxicam (MOBIC) 7.5 MG tablet Take 1 tablet (7.5 mg total) by mouth daily. (Patient not taking: Reported on 05/31/2022) 90 tablet 1     Physical Exam: Left shoulder demonstrates painful and severely restricted motion as noted at her recent office visits.  She has globally decreased strength to manual muscle testing secondary to pain.  Otherwise neurovascular intact in left upper extremity.  Radiographs  Plain films of the left shoulder confirm severe osteoarthritis and changes consistent with chronic rotator cuff tear arthropathy.  Vitals  Temp:  [98.4 F (36.9 C)] 98.4 F (36.9 C) (06/15 1045) Pulse Rate:  [75] 75 (06/15 1045) Resp:  [17] 17 (06/15 1045) BP: (163)/(86) 163/86 (06/15  1045) SpO2:  [98 %] 98 % (06/15 1045) Weight:  [69.6 kg] 69.6 kg (06/15 1035)  Assessment/Plan  Impression: Left shoulder rotator cuff tear arthropathy  Plan of Action: Procedure(s): REVERSE SHOULDER ARTHROPLASTY  Myliyah Rebuck M Maddix Heinz 06/08/2022, 11:39 AM Contact # [phone]

## 2022-06-08 NOTE — Discharge Instructions (Signed)

## 2022-06-08 NOTE — Transfer of Care (Signed)
Immediate Anesthesia Transfer of Care Note  Patient: Gloria Werner  Procedure(s) Performed: REVERSE SHOULDER ARTHROPLASTY (Left: Shoulder)  Patient Location: PACU  Anesthesia Type:General  Level of Consciousness: awake  Airway & Oxygen Therapy: Patient Spontanous Breathing  Post-op Assessment: Report given to RN  Post vital signs: stable  Last Vitals:  Vitals Value Taken Time  BP 153/81 06/08/22 1420  Temp    Pulse 94 06/08/22 1423  Resp 17 06/08/22 1423  SpO2 94 % 06/08/22 1423  Vitals shown include unvalidated device data.  Last Pain:  Vitals:   06/08/22 1045  TempSrc: Oral  PainSc:       Patients Stated Pain Goal: 5 (35/70/17 7939)  Complications: No notable events documented.

## 2022-06-08 NOTE — Op Note (Signed)
06/08/2022  1:54 PM  PATIENT:   Gloria Werner  81 y.o. female  PRE-OPERATIVE DIAGNOSIS:  Left shoulder rotator cuff tear arthropathy  POST-OPERATIVE DIAGNOSIS: Same  PROCEDURE: Left shoulder reverse arthroplasty utilizing a press-fit size 9 Arthrex stem with a neutral metaphysis, +3 polyethylene insert, 36/+4 glenosphere and a small/+2 baseplate  SURGEON:  Kristie Bracewell, Metta Clines M.D.  ASSISTANTS: Jenetta Loges, PA-C  ANESTHESIA:   General endotracheal and interscalene block with Exparel  EBL: 100 cc  SPECIMEN: None  Drains: None   PATIENT DISPOSITION:  PACU - hemodynamically stable.    PLAN OF CARE: Discharge to home after PACU  Brief history:  Patient is a 81 year old female who has been followed for chronic and progressively increasing left shoulder pain related to severe rotator cuff tear arthropathy.  Due to her increasing functional limitations and failure to respond to prolonged attempts at conservative management, she is brought to the operating room at this time for planned left shoulder reverse arthroplasty.  Preoperatively, I counseled the patient regarding treatment options and risks versus benefits thereof.  Possible surgical complications were all reviewed including potential for bleeding, infection, neurovascular injury, persistent pain, loss of motion, anesthetic complication, failure of the implant, and possible need for additional surgery. They understand and accept and agrees with our planned procedure.   Procedure in detail:  After undergoing routine preop evaluation the patient received prophylactic antibiotics and interscalene block with Exparel was established in the holding area by the anesthesia department.  Subsequently placed spine on the operating table and underwent the smooth induction of a general endotracheal anesthesia.  Placed into the beachchair position and appropriately padded and protected.  The left shoulder girdle region was sterilely  prepped and draped in standard fashion.  Timeout was called.  A deltopectoral approach to the left shoulder was made to an approximately 8 cm incision.  Skin flaps were elevated dissection carried deeply the deltopectoral interval was then developed from proximal to distal with the vein taken laterally.  Conjoined tendon mobilized and retracted medially and the upper centimeter the pectoralis major was tenotomized for exposure.  The long head bicep tendon was then tenodesed at the upper border the pectoralis major tendon with the proximal segment unroofed and excised.  The rotator cuff was then divided along with the rotator interval from the apex of the bicipital groove to the base of the coracoid and the subscap was then elevated from the lesser tuberosity using electrocautery and was tagged with a pair of suture tape sutures.  Capsular attachments were then divided from the anterior and inferior margins of the humeral neck and humeral head was then delivered through the wound.  An extra medullary guide was then used to outline a proposed humeral head resection which we performed with an oscillating saw at approximate 20 degrees retroversion.  Peripheral osteophytes were then removed with a rondure and a metal cap was placed over the cut proximal humeral surface.  Glenoid was then exposed and a circumferential labral resection was completed.  A guidepin was then directed into the center of the glenoid and it was then prepared with the central followed by the peripheral reamer and then the drill and tapped for a 30 mm lag screw.  Our baseplate was then assembled and inserted with some vancomycin powder applied to the threads of the lag screw with excellent purchase achieved.  The peripheral locking screws were all then placed using standard technique with good purchase.  A 36/+4 glenosphere was then impacted onto  the baseplate and the central locking screw was placed.  We returned our attention back to the proximal  humerus where the canal was opened and we ultimately broached up to a size 9 stem at approximately 20 degrees retroversion.  A neutral metaphyseal reaming guide was then used to prepare the metaphysis.  A trial implant was then placed and trial reduction showed good motion good stability and good soft tissue balance.  The trial was removed.  The final implant was assembled.  The canal was cleaned and dried with vancomycin powder was applied liberally in the canal.  The implant was then seated with excellent purchase and fixation.  Final reduction again showed the +3 to give [name] good motion good stability good soft tissue balance.  The trial was removed the final +3 poly was then impacted final reduction was performed again showing excellent motion stability and soft tissue balance.  Subscap was confirmed to be mobile and was repaired back to the eyelets on the collar of the implant with the previously placed suture tape sutures.  The wound was then copious irrigated.  Final hemostasis was obtained.  The balance of the vancomycin powder was then spread liberally throughout the deep soft tissue planes.  The deltopectoral interval was reapproximated with a series of figure-of-eight and 1 Vicryl sutures.  2-0 Monocryl used to close the subcu layer and intracuticular 3-0 Monocryl used to close the skin followed by Dermabond and Aquacel dressing.  Left arm was then placed into a sling and the patient was awakened, extubated, and taken to the recovery room in stable condition.  Jenetta Loges, PA-C was utilized as an Environmental consultant throughout this case, essential for help with positioning the patient, positioning extremity, tissue manipulation, implantation of the prosthesis, suture management, wound closure, and intraoperative decision-making.  Marin Shutter MD   Contact # [phone]

## 2022-06-08 NOTE — Anesthesia Procedure Notes (Signed)
Anesthesia Regional Block: Interscalene brachial plexus block   Pre-Anesthetic Checklist: , timeout performed,  Correct Patient, Correct Site, Correct Laterality,  Correct Procedure, Correct Position, site marked,  Risks and benefits discussed,  Surgical consent,  Pre-op evaluation,  At surgeon's request and post-op pain management  Laterality: Upper and Left  Prep: Maximum Sterile Barrier Precautions used, chloraprep       Needles:  Injection technique: Single-shot  Needle Type: Echogenic Needle     Needle Length: 5cm  Needle Gauge: 21     Additional Needles:   Procedures:,,,, ultrasound used (permanent image in chart),,    Narrative:  Start time: 06/08/2022 11:38 AM End time: 06/08/2022 11:44 AM Injection made incrementally with aspirations every 5 mL.  Performed by: Personally  Anesthesiologist: Barnet Glasgow, MD  Additional Notes: Block assessed prior to procedure. Patient tolerated procedure well.

## 2022-06-08 NOTE — Evaluation (Signed)
Occupational Therapy Evaluation Patient Details Name: Gloria Werner MRN: [id number] DOB: [birth year] Today's Date: 06/08/2022   History of Present Illness Patient s/p left reverse shoulder arthroplasty   Clinical Impression   Mrs. Gloria Werner is a 81 year old woman s/p shoulder replacement without functional use of left upper extremity secondary to effects of surgery and interscalene block and shoulder precautions. Therapist provided education and instruction to patient and spouse in regards to exercises, precautions, positioning, donning upper extremity clothing and bathing while maintaining shoulder precautions, ice and edema management and donning/doffing sling. Patient and spouse verbalized understanding and handouts provided to maximize retention of education. Patient's daughter also present for education and instruction. Patient needed assistance to donn shirt, underwear, pants, socks and shoes and provided with instruction on compensatory strategies to perform ADLs. Patient to follow up with MD for further therapy needs.        Recommendations for follow up therapy are one component of a multi-disciplinary discharge planning process, led by the attending physician.  Recommendations may be updated based on patient status, additional functional criteria and insurance authorization.   Follow Up Recommendations  Follow physician's recommendations for discharge plan and follow up therapies    Assistance Recommended at Discharge Intermittent Supervision/Assistance  Patient can return home with the following A little help with bathing/dressing/bathroom;Assistance with cooking/housework    Functional Status Assessment  Patient has had a recent decline in their functional status and demonstrates the ability to make significant improvements in function in a reasonable and predictable amount of time.  Equipment Recommendations  None recommended by OT    Recommendations for Other  Services       Precautions / Restrictions Precautions Precautions: Shoulder Shoulder Interventions: Shoulder sling/immobilizer;Off for dressing/bathing/exercises Precaution Booklet Issued:  (handouts) Precaution Comments: If sitting in controlled environment, ok to come out of sling to give neck a break. Please sleep in it to protect until follow up in office.     OK to use operative arm for feeding, hygiene and ADLs.   Ok to instruct Pendulums and lap slides as exercises. Ok to use operative arm within the following parameters for ADL purposes     New ROM (8/18)   Ok for PROM, AAROM, AROM within pain tolerance and within the following ROM   ER 20   ABD 45   FE 60 Restrictions Weight Bearing Restrictions: Yes LUE Weight Bearing: Non weight bearing      Mobility Bed Mobility Overal bed mobility: Modified Independent                  Transfers Overall transfer level: Modified independent                        Balance Overall balance assessment: Mild deficits observed, not formally tested                                         ADL either performed or assessed with clinical judgement   ADL                                               Vision Patient Visual Report: No change from baseline       Perception     Praxis  Pertinent Vitals/Pain Pain Assessment Pain Assessment: No/denies pain     Hand Dominance     Extremity/Trunk Assessment Upper Extremity Assessment Upper Extremity Assessment: LUE deficits/detail LUE Deficits / Details: impaired motor control and sensation secondary to block   Lower Extremity Assessment Lower Extremity Assessment: Overall WFL for tasks assessed   Cervical / Trunk Assessment Cervical / Trunk Assessment: Normal   Communication     Cognition Arousal/Alertness: Awake/alert Behavior During Therapy: WFL for tasks assessed/performed Overall Cognitive Status: Within Functional  Limits for tasks assessed                                       General Comments       Exercises     Shoulder Instructions Shoulder Instructions Donning/doffing shirt without moving shoulder: Caregiver independent with task Method for sponge bathing under operated UE: Independent Donning/doffing sling/immobilizer: Independent Correct positioning of sling/immobilizer: Independent Pendulum exercises (written home exercise program): Independent ROM for elbow, wrist and digits of operated UE: Independent Sling wearing schedule (on at all times/off for ADL's): Independent Proper positioning of operated UE when showering: Independent Dressing change: Independent Positioning of UE while sleeping: [place] expects to be discharged to:: Private residence Living Arrangements: Spouse/significant other Available Help at Discharge: Family;Available 24 hours/day                                    Prior Functioning/Environment                          OT Problem List: Decreased strength;Decreased range of motion;Impaired UE functional use      OT Treatment/Interventions:      OT Goals(Current goals can be found in the care plan section) Acute Rehab OT Goals OT Goal Formulation: All assessment and education complete, DC therapy  OT Frequency:      Co-evaluation              AM-PAC OT "6 Clicks" Daily Activity     Outcome Measure Help from another person eating meals?: A Little Help from another person taking care of personal grooming?: A Little Help from another person toileting, which includes using toliet, bedpan, or urinal?: A Little Help from another person bathing (including washing, rinsing, drying)?: A Little Help from another person to put on and taking off regular upper body clothing?: A Lot Help from another person to put on and taking off regular lower body clothing?: A Lot 6 Click Score: 16    End of Session Nurse Communication:  (OT education complete)  Activity Tolerance: Patient tolerated treatment well Patient left: in chair;with family/visitor present  OT Visit Diagnosis: Muscle weakness (generalized) (M62.81)                Time: 4503-8882 OT Time Calculation (min): 23 min Charges:  OT General Charges $OT Visit: 1 Visit OT Evaluation $OT Eval Low Complexity: 1 Low  Yvetta Drotar, OTR/L [place]  Office [phone] Pager: [place] 06/08/2022, 4:30 PM

## 2022-06-08 NOTE — Progress Notes (Signed)
AssistedDr. Valma Cava with left, interscalene  block. Side rails up, monitors on throughout procedure. See vital signs in flow sheet. Tolerated Procedure well.

## 2022-06-09 ENCOUNTER — Encounter (HOSPITAL_COMMUNITY): Payer: Self-pay | Admitting: Orthopedic Surgery

## 2022-06-19 DIAGNOSIS — Z96612 Presence of left artificial shoulder joint: Secondary | ICD-10-CM | POA: Diagnosis not present

## 2022-06-22 ENCOUNTER — Other Ambulatory Visit: Payer: Self-pay | Admitting: Family Medicine

## 2022-06-22 DIAGNOSIS — I700 Atherosclerosis of aorta: Secondary | ICD-10-CM

## 2022-06-26 DIAGNOSIS — M25512 Pain in left shoulder: Secondary | ICD-10-CM | POA: Diagnosis not present

## 2022-07-04 DIAGNOSIS — M25512 Pain in left shoulder: Secondary | ICD-10-CM | POA: Diagnosis not present

## 2022-07-06 DIAGNOSIS — M25512 Pain in left shoulder: Secondary | ICD-10-CM | POA: Diagnosis not present

## 2022-07-11 ENCOUNTER — Telehealth: Payer: Self-pay

## 2022-07-11 DIAGNOSIS — M25512 Pain in left shoulder: Secondary | ICD-10-CM | POA: Diagnosis not present

## 2022-07-11 NOTE — Progress Notes (Signed)
Chronic Care Management Pharmacy Assistant   Name: Gloria Werner  MRN: [id number] DOB: Sep 02, 1941  Reason for Encounter: Hypertension Disease State Call.   Recent office visits:  No recent office visit  Recent consult visits:  06/19/2022 Jenetta Loges PA-C ([facility])-No Medication Changes noted 05/31/2022 Dr. Onnie Graham MD ([facility])-No Medication Changes noted 05/15/2022 Rexene Edison NP (Pulmonary) Start Benzonatate 200 mg PRN 05/05/2022 Sydnee Cabal ([facility])-No Medication Changes noted 04/30/2022 Sydnee Cabal [facility])- No Medication Changes noted 04/20/2022 Gerrit Halls PA [facility])- No Medication Changes noted  Hospital visits:  Medication Reconciliation was completed by comparing discharge summary, patient's EMR and Pharmacy list, and upon discussion with patient.  Admitted to the hospital on 06/08/2022  due to Essential Hypertension. Discharge date was 06/08/2022. Discharged from [place]?Medications Started at [facility] Discharge:?? -started None ID  Medication Changes at Hospital Discharge: -Changed None ID  Medications Discontinued at Hospital Discharge: -Stopped None ID  Medications that remain the same after Hospital Discharge:??  -All other medications will remain the same.    Medications: Outpatient Encounter Medications as of 07/11/2022  Medication Sig   acetaminophen (TYLENOL) 500 MG tablet Take 500-1,000 mg by mouth at bedtime as needed for moderate pain.   albuterol (VENTOLIN HFA) 108 (90 Base) MCG/ACT inhaler Inhale 1-2 puffs into the lungs every 6 (six) hours as needed. (Patient not taking: Reported on 05/31/2022)   amLODipine (NORVASC) 5 MG tablet Take 1 tablet (5 mg total) by mouth daily.   benzonatate (TESSALON) 200 MG capsule Take 1 capsule (200 mg total) by mouth 3 (three) times daily as needed for cough. (Patient not taking: Reported on 05/31/2022)   Biotin 10000 MCG TABS Take 10,000 mcg by mouth daily.    buPROPion (WELLBUTRIN) 75 MG tablet Take 1 tablet (75 mg total) by mouth daily.   calcium carbonate (OSCAL) 1500 (600 Ca) MG TABS tablet Take 600 mg of elemental calcium by mouth daily with breakfast.   Carboxymethylcellul-Glycerin (LUBRICATING EYE DROPS OP) Place 1 drop into both eyes daily as needed (dry eyes). Visine   cyclobenzaprine (FLEXERIL) 10 MG tablet Take 1 tablet (10 mg total) by mouth 3 (three) times daily as needed for muscle spasms.   dicyclomine (BENTYL) 10 MG capsule Take 1 capsule (10 mg total) by mouth 4 (four) times daily -  before meals and at bedtime. (Patient taking differently: Take 10 mg by mouth 3 (three) times daily as needed for spasms.)   Docusate Calcium (STOOL SOFTENER PO) Take 1 tablet by mouth daily.   meloxicam (MOBIC) 7.5 MG tablet Take 1 tablet (7.5 mg total) by mouth daily.   ondansetron (ZOFRAN) 4 MG tablet Take 1 tablet (4 mg total) by mouth every 8 (eight) hours as needed for nausea or vomiting.   oxyCODONE-acetaminophen (PERCOCET) 5-325 MG tablet Take 1 tablet by mouth every 4 (four) hours as needed (max 6 q).   polyethylene glycol (MIRALAX / GLYCOLAX) 17 g packet Take 17 g by mouth daily. Takes an extra scoop in the evening as needed for moderate-severe constipation.   pyridOXINE (VITAMIN B-6) 100 MG tablet Take 100 mg by mouth daily.   rosuvastatin (CRESTOR) 5 MG tablet TAKE 1 TABLET BY MOUTH THREE TIMES A WEEK   saccharomyces boulardii (FLORASTOR) 250 MG capsule Take 2 capsules (500 mg total) by mouth 2 (two) times daily. (Patient taking differently: Take 250 mg by mouth daily.)   triamcinolone (NASACORT ALLERGY 24HR) 55 MCG/ACT AERO nasal inhaler Place 1 spray into the nose  daily.   vitamin C (ASCORBIC ACID) 500 MG tablet Take 500 mg by mouth daily.   Vitamin D, Ergocalciferol, (DRISDOL) 1.25 MG (50000 UNIT) CAPS capsule Take 1 capsule (50,000 Units total) by mouth every 30 (thirty) days.   No facility-administered encounter medications on file as of  07/11/2022.   Care Gaps: None ID     Star Rating Drugs: Rosuvastatin 5 mg last filled 06/22/2022 84 day supply at [facility].   Medication Fill Gaps: None ID  Reviewed chart prior to disease state call. Spoke with patient regarding BP  Recent Office Vitals: BP Readings from Last 3 Encounters:  06/08/22 128/66  06/01/22 (!) 144/90  05/15/22 140/80   Pulse Readings from Last 3 Encounters:  06/08/22 88  06/01/22 82  05/15/22 90    Wt Readings from Last 3 Encounters:  06/08/22 153 lb 6.4 oz (69.6 kg)  06/01/22 153 lb 6.4 oz (69.6 kg)  05/15/22 154 lb 3.2 oz (69.9 kg)     Kidney Function Lab Results  Component Value Date/Time   CREATININE 0.85 06/01/2022 02:11 PM   CREATININE 0.72 02/17/2022 09:23 AM   GFR 80.20 02/17/2022 09:23 AM   GFRNONAA >60 06/01/2022 02:11 PM   GFRAA >60 10/17/2018 05:14 AM       Latest Ref Rng & Units 06/01/2022    2:11 PM 02/17/2022    9:23 AM 11/05/2021   10:54 AM  BMP  Glucose 70 - 99 mg/dL 130  95  133   BUN 8 - 23 mg/dL '15  15  15   '$ Creatinine 0.44 - 1.00 mg/dL 0.85  0.72  0.66   Sodium 135 - 145 mmol/L 140  140  136   Potassium 3.5 - 5.1 mmol/L 4.2  4.6  3.4   Chloride 98 - 111 mmol/L 104  105  107   CO2 22 - 32 mmol/L 28  32  23   Calcium 8.9 - 10.3 mg/dL 9.6  9.3  8.1     Current antihypertensive regimen:  Amlodipine 5 mg daily   How often are you checking your Blood Pressure? infrequently  Current home BP readings:  Patient states her blood pressure is doing better ranging around 130's-80's.  What recent interventions/DTPs have been made by any provider to improve Blood Pressure control since last CPP Visit: None ID  Any recent hospitalizations or ED visits since last visit with CPP? Yes  What diet changes have been made to improve Blood Pressure Control?  Patient denies any changes made to her diet.   What exercise is being done to improve your Blood Pressure Control?  Patient reports she is unable to exercise  due to her shoulder replacement surgery she recently had.  Adherence Review: Is the patient currently on ACE/ARB medication? No Does the patient have >5 day gap between last estimated fill dates? No  Anderson Malta Clinical Production designer, theatre/television/film [phone]

## 2022-07-13 DIAGNOSIS — M25512 Pain in left shoulder: Secondary | ICD-10-CM | POA: Diagnosis not present

## 2022-07-18 DIAGNOSIS — Z96612 Presence of left artificial shoulder joint: Secondary | ICD-10-CM | POA: Insufficient documentation

## 2022-07-18 DIAGNOSIS — M25512 Pain in left shoulder: Secondary | ICD-10-CM | POA: Diagnosis not present

## 2022-07-20 DIAGNOSIS — M25512 Pain in left shoulder: Secondary | ICD-10-CM | POA: Diagnosis not present

## 2022-07-25 DIAGNOSIS — M25512 Pain in left shoulder: Secondary | ICD-10-CM | POA: Diagnosis not present

## 2022-07-27 DIAGNOSIS — M25512 Pain in left shoulder: Secondary | ICD-10-CM | POA: Diagnosis not present

## 2022-08-03 DIAGNOSIS — M25512 Pain in left shoulder: Secondary | ICD-10-CM | POA: Diagnosis not present

## 2022-08-10 ENCOUNTER — Ambulatory Visit: Payer: Medicare Other | Admitting: Physician Assistant

## 2022-08-10 ENCOUNTER — Encounter: Payer: Self-pay | Admitting: Physician Assistant

## 2022-08-10 VITALS — BP 140/86 | HR 83 | Ht 59.0 in | Wt 154.0 lb

## 2022-08-10 DIAGNOSIS — E7800 Pure hypercholesterolemia, unspecified: Secondary | ICD-10-CM

## 2022-08-10 DIAGNOSIS — I340 Nonrheumatic mitral (valve) insufficiency: Secondary | ICD-10-CM

## 2022-08-10 DIAGNOSIS — R011 Cardiac murmur, unspecified: Secondary | ICD-10-CM

## 2022-08-10 DIAGNOSIS — I10 Essential (primary) hypertension: Secondary | ICD-10-CM | POA: Diagnosis not present

## 2022-08-10 DIAGNOSIS — M25512 Pain in left shoulder: Secondary | ICD-10-CM | POA: Diagnosis not present

## 2022-08-10 NOTE — Patient Instructions (Signed)
Medication Instructions:  Your physician recommends that you continue on your current medications as directed. Please refer to the Current Medication list given to you today.  *If you need a refill on your cardiac medications before your next appointment, please call your pharmacy*  Testing/Procedures: Your physician has requested that you have an echocardiogram. Echocardiography is a painless test that uses sound waves to create images of your heart. It provides your doctor with information about the size and shape of your heart and how well your heart's chambers and valves are working. This procedure takes approximately one hour. There are no restrictions for this procedure.  Follow-Up: At [facility], you and your health needs are our priority.  As part of our continuing mission to provide you with exceptional heart care, we have created designated Provider Care Teams.  These Care Teams include your primary Cardiologist (physician) and Advanced Practice Providers (APPs -  Physician Assistants and Nurse Practitioners) who all work together to provide you with the care you need, when you need it.  Your next appointment:   1 year(s)  The format for your next appointment:   In Person  Provider:   Jenkins Rouge, MD    Important Information About Sugar

## 2022-08-10 NOTE — Progress Notes (Signed)
Cardiology Office Note:    Date:  08/10/2022   ID:  Alveta, Quintela 1941-12-19, MRN [id number]  PCP:  Gloria Salter, MD  [facility] Cardiologist:  Gloria Rouge, MD  [facility] Electrophysiologist:  None   Chief Complaint: 2 yr follow up   History of Present Illness:    Gloria Werner is a 81 y.o. female with a hx of no cardiac disease, chronic cough, non smoker, HTN, HLD, chronic edema, GERD, plantar fascitis (L foot), arthtitis who is being seen for  follow-up. Previously seen for evaluation of a murmur. Echo showed normal EF and mild MR.  Last seen by APP 09/2020.   Here today for follow up.  Patient with a history of chronic cough.  Also evaluated by pulmonary without evidence of interstitial lung disease.  Felt upper airway reactive disease.  She denies chest pain, shortness of breath, orthopnea, PND, syncope, lower extremity edema or melena.  At home, blood pressure runs in 140 over 80s.  Here 150/92, repeat check 140/86.  Does not want to increase medication currently.  She will follow-up with PCP.  Mild intermittent ankle edema.    Underwent right hip replacement November 2022 and left shoulder replacement June 2023.  Recovering well.  Past Medical History:  Diagnosis Date   Allergic rhinitis due to pollen    Cancer [facility])    skin   Cataract    NS OU   Complication of anesthesia    Coronary artery disease    Cough    De Quervain's syndrome (tenosynovitis) 07/03/2018   Gastroesophageal reflux disease without esophagitis    Heart murmur    Hypertension    Irritable bowel syndrome    Lichen simplex chronicus 09/2004   PONV (postoperative nausea and vomiting)    Unilateral primary osteoarthritis, 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    Past Surgical History:  Procedure Laterality Date   ABDOMINAL HYSTERECTOMY  1988   secondary to prolapse   BLADDER SUSPENSION     A-P with Hyst   CATARACT EXTRACTION Bilateral    INSERTION OF MESH  10/16/2018   Procedure:  INSERTION OF MESH;  Surgeon: Gloria Hughs, MD;  Location: [facility];  Service: Urology;;   [place] Left 06/08/2022   Procedure: REVERSE SHOULDER ARTHROPLASTY;  Surgeon: Gloria Britain, MD;  Location: [facility];  Service: Orthopedics;  Laterality: Left;   ROBOTIC ASSISTED LAPAROSCOPIC SACROCOLPOPEXY N/A 10/16/2018   Procedure: XI ROBOTIC ASSISTED LAPAROSCOPIC SACROCOLPOPEXY;  Surgeon: Gloria Hughs, MD;  Location: [facility];  Service: Urology;  Laterality: N/A;   TONSILLECTOMY     TOTAL HIP ARTHROPLASTY Right 11/04/2021   Procedure: RIGHT TOTAL HIP ARTHROPLASTY ANTERIOR APPROACH;  Surgeon: Gloria Rossetti, MD;  Location: [facility];  Service: Orthopedics;  Laterality: Right;    Current Medications: Current Meds  Medication Sig   acetaminophen (TYLENOL) 500 MG tablet Take 500-1,000 mg by mouth at bedtime as needed for moderate pain.   albuterol (VENTOLIN HFA) 108 (90 Base) MCG/ACT inhaler Inhale 1-2 puffs into the lungs every 6 (six) hours as needed.   amLODipine (NORVASC) 5 MG tablet Take 1 tablet (5 mg total) by mouth daily.   Biotin 10000 MCG TABS Take 10,000 mcg by mouth daily.   buPROPion (WELLBUTRIN) 75 MG tablet Take 1 tablet (75 mg total) by mouth daily.   calcium carbonate (OSCAL) 1500 (600 Ca) MG TABS tablet Take 600 mg of elemental calcium by mouth daily with breakfast.  Carboxymethylcellul-Glycerin (LUBRICATING EYE DROPS OP) Place 1 drop into both eyes daily as needed (dry eyes). Visine   cyclobenzaprine (FLEXERIL) 10 MG tablet Take 1 tablet (10 mg total) by mouth 3 (three) times daily as needed for muscle spasms.   Docusate Calcium (STOOL SOFTENER PO) Take 1 tablet by mouth daily.   polyethylene glycol (MIRALAX / GLYCOLAX) 17 g packet Take 17 g by mouth daily. Takes an extra scoop in the evening as needed for moderate-severe constipation.   pyridOXINE (VITAMIN B-6) 100 MG tablet Take 100 mg by mouth daily.   rosuvastatin  (CRESTOR) 5 MG tablet TAKE 1 TABLET BY MOUTH THREE TIMES A WEEK   saccharomyces boulardii (FLORASTOR) 250 MG capsule Take 2 capsules (500 mg total) by mouth 2 (two) times daily. (Patient taking differently: Take 250 mg by mouth daily.)   triamcinolone (NASACORT ALLERGY 24HR) 55 MCG/ACT AERO nasal inhaler Place 1 spray into the nose daily.   vitamin C (ASCORBIC ACID) 500 MG tablet Take 500 mg by mouth daily.   Vitamin D, Ergocalciferol, (DRISDOL) 1.25 MG (50000 UNIT) CAPS capsule Take 1 capsule (50,000 Units total) by mouth every 30 (thirty) days.     Allergies:   Lexapro [escitalopram], Ace inhibitors, and Codeine   Social History   Socioeconomic History   Marital status: Married    Spouse name: Gloria Werner   Number of children: 2   Years of education: 12   Highest education level: Not on file  Occupational History   Occupation: Environmental health practitioner   Occupation: Retired  Tobacco Use   Smoking status: Never   Smokeless tobacco: Never  Scientific laboratory technician Use: Never used  Substance and Sexual Activity   Alcohol use: No   Drug use: No   Sexual activity: Not Currently    Partners: Male    Birth control/protection: Surgical    Comment: hysterectomy  Other Topics Concern   Not on file  Social History Narrative   HSG. Married '63. 1 son- 34'; 1-daughter-'68; grandchildren 3.  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 Provided lead to [email or web address].   Social Determinants of Health   Financial Resource Strain: Low Risk  (10/17/2021)   Overall Financial Resource Strain (CARDIA)    Difficulty of Paying Living Expenses: Not hard at all  Food Insecurity: No Food Insecurity (09/20/2021)   Hunger Vital Sign    Worried About Running Out of Food in the Last Year: Never true    Ran Out of Food in the Last Year: Never true  Transportation Needs: No Transportation Needs (09/20/2021)   PRAPARE - Hydrologist (Medical): No    Lack of  Transportation (Non-Medical): No  Physical Activity: Insufficiently Active (09/20/2021)   Exercise Vital Sign    Days of Exercise per Week: 4 days    Minutes of Exercise per Session: 30 min  Stress: No Stress Concern Present (09/20/2021)   [place]    Feeling of Stress : Not at all  Social Connections: Moderately Integrated (09/20/2021)   Social Connection and Isolation Panel [NHANES]    Frequency of Communication with Friends and Family: More than three times a week    Frequency of Social Gatherings with Friends and Family: More than three times a week    Attends Religious Services: More than 4 times per year    Active Member of [employer] or Organizations: No    Attends [employer]  or Organization Meetings: Never    Marital Status: Married     Family History: The patient's family history includes Asthma in her mother; Breast cancer in her brother; Cancer in her brother and brother; Cancer (age of onset: 29) in her father; Coronary artery disease in her brother; Emphysema in her mother; Lymphoma in her mother. There is no history of Diabetes, Hypertension, Colon cancer, Esophageal cancer, Rectal cancer, or Stomach cancer.    ROS:   Please see the history of present illness.    All other systems reviewed and are negative.   EKGs/Labs/Other Studies Reviewed:    The following studies were reviewed today:  Echo 06/2019 1. The left ventricle has normal systolic function with an ejection  fraction of 60-65%. The cavity size was normal. Left ventricular diastolic  Doppler parameters are consistent with impaired relaxation.   2. The interatrial septum appears to be lipomatous.   3. The right ventricle has normal systolic function. The cavity   normal.   4. The mitral valve is abnormal. There is mild mitral annular  calcification present.   5. The tricuspid valve is grossly normal.   6. The aortic valve is tricuspid. Mild thickening  of the aortic valve. No  stenosis of the aortic valve.   7. The aorta is normal in size and structure.   8. Normal LV systolic function; mild diastolic dysfunction; mild MR and  TR.   EKG:  EKG is  ordered today.  The ekg ordered today demonstrates NSR  Recent Labs: 02/17/2022: ALT 13 06/01/2022: BUN 15; Creatinine, Ser 0.85; Hemoglobin 13.1; Platelets 201; Potassium 4.2; Sodium 140  Recent Lipid Panel    Component Value Date/Time   CHOL 133 02/17/2022 0923   TRIG 79.0 02/17/2022 0923   HDL 48.10 02/17/2022 0923   CHOLHDL 3 02/17/2022 0923   VLDL 15.8 02/17/2022 0923   LDLCALC 69 02/17/2022 0923   LDLDIRECT 84.0 08/03/2020 1135     Physical Exam:    VS:  BP (!) 140/86   Pulse 83   Ht '4\' 11"'$  (1.499 m)   Wt 154 lb (69.9 kg)   LMP 12/25/1986 (Approximate)   SpO2 97%   BMI 31.10 kg/m     Wt Readings from Last 3 Encounters:  08/10/22 154 lb (69.9 kg)  06/08/22 153 lb 6.4 oz (69.6 kg)  06/01/22 153 lb 6.4 oz (69.6 kg)     GEN:  Well nourished, well developed in no acute distress HEENT: Normal NECK: No JVD; No carotid bruits LYMPHATICS: No lymphadenopathy CARDIAC: RRR, 2/6 murmurs, rubs, gallops RESPIRATORY:  Clear to auscultation without rales, wheezing or rhonchi  ABDOMEN: Soft, non-tender, non-distended MUSCULOSKELETAL:  No edema; No deformity  SKIN: Warm and dry NEUROLOGIC:  Alert and oriented x 3 PSYCHIATRIC:  Normal affect   ASSESSMENT AND PLAN:    Hypertension At home, blood pressure runs in 140 over 80s.  Here 150/92, repeat check 140/86.  Does not want to increase medication currently.  She will follow-up with PCP.  2.  Murmur/mild MR -Update echocardiogram  3.  Chronic cough -Stable  4. HLD - Per PCP - Continue statin   Medication Adjustments/Labs and Tests Ordered: Current medicines are reviewed at length with the patient today.  Concerns regarding medicines are outlined above.  Orders Placed This Encounter  Procedures   EKG 12-Lead    ECHOCARDIOGRAM COMPLETE   No orders of the defined types were placed in this encounter.   Patient Instructions  Medication Instructions:  Your physician  recommends that you continue on your current medications as directed. Please refer to the Current Medication list given to you today.  *If you need a refill on your cardiac medications before your next appointment, please call your pharmacy*  Testing/Procedures: Your physician has requested that you have an echocardiogram. Echocardiography is a painless test that uses sound waves to create images of your heart. It provides your doctor with information about the size and shape of your heart and how well your heart's chambers and valves are working. This procedure takes approximately one hour. There are no restrictions for this procedure.  Follow-Up: At [facility], you and your health needs are our priority.  As part of our continuing mission to provide you with exceptional heart care, we have created designated Provider Care Teams.  These Care Teams include your primary Cardiologist (physician) and Advanced Practice Providers (APPs -  Physician Assistants and Nurse Practitioners) who all work together to provide you with the care you need, when you need it.  Your next appointment:   1 year(s)  The format for your next appointment:   In Person  Provider:   Jenkins Rouge, MD    Important Information About Sugar         Signed, Leanor Kail, [place]  08/10/2022 9:22 AM    [place]

## 2022-08-15 DIAGNOSIS — M25512 Pain in left shoulder: Secondary | ICD-10-CM | POA: Diagnosis not present

## 2022-08-17 ENCOUNTER — Ambulatory Visit (INDEPENDENT_AMBULATORY_CARE_PROVIDER_SITE_OTHER): Payer: Medicare Other

## 2022-08-17 ENCOUNTER — Ambulatory Visit: Payer: Medicare Other | Admitting: Orthopaedic Surgery

## 2022-08-17 ENCOUNTER — Encounter: Payer: Self-pay | Admitting: Orthopaedic Surgery

## 2022-08-17 ENCOUNTER — Ambulatory Visit (INDEPENDENT_AMBULATORY_CARE_PROVIDER_SITE_OTHER): Payer: Medicare Other | Admitting: Family Medicine

## 2022-08-17 ENCOUNTER — Encounter: Payer: Self-pay | Admitting: Family Medicine

## 2022-08-17 VITALS — BP 138/80 | HR 84 | Temp 96.9°F | Ht 59.0 in | Wt 152.8 lb

## 2022-08-17 DIAGNOSIS — M25512 Pain in left shoulder: Secondary | ICD-10-CM | POA: Diagnosis not present

## 2022-08-17 DIAGNOSIS — I10 Essential (primary) hypertension: Secondary | ICD-10-CM

## 2022-08-17 DIAGNOSIS — Z96612 Presence of left artificial shoulder joint: Secondary | ICD-10-CM | POA: Diagnosis not present

## 2022-08-17 DIAGNOSIS — Z96641 Presence of right artificial hip joint: Secondary | ICD-10-CM

## 2022-08-17 DIAGNOSIS — F32A Depression, unspecified: Secondary | ICD-10-CM | POA: Diagnosis not present

## 2022-08-17 DIAGNOSIS — G8929 Other chronic pain: Secondary | ICD-10-CM | POA: Diagnosis not present

## 2022-08-17 DIAGNOSIS — E785 Hyperlipidemia, unspecified: Secondary | ICD-10-CM | POA: Diagnosis not present

## 2022-08-17 DIAGNOSIS — F419 Anxiety disorder, unspecified: Secondary | ICD-10-CM | POA: Diagnosis not present

## 2022-08-17 MED ORDER — AMLODIPINE BESYLATE 5 MG PO TABS: 5.0000 mg | ORAL_TABLET | Freq: Every day | ORAL | 1 refills | Status: DC

## 2022-08-17 MED ORDER — BUPROPION HCL 75 MG PO TABS: 75.0000 mg | ORAL_TABLET | Freq: Every day | ORAL | 3 refills | Status: DC

## 2022-08-17 MED ORDER — MELOXICAM 15 MG PO TABS: 15.0000 mg | ORAL_TABLET | Freq: Every day | ORAL | 6 refills | Status: DC | PRN

## 2022-08-17 NOTE — Progress Notes (Signed)
  The patient is about 9 months status post a right total hip arthroplasty.  She is an active 81 year old female.  She did let me know in June of this year she did have a shoulder arthroplasty by Dr. Onnie Graham in town.  This was a reverse shoulder and she is doing very well with that.  We had seen that she had had significant arthritis in that shoulder and an MRI confirmed rotator cuff arthropathy.  She says her right hip is doing well.  She still gets pain in both of her legs and some groin pain but this is more on the left side.  An AP pelvis and lateral of her right hip shows a well-seated total hip arthroplasty on the right side.  Her left hip shows no significant arthritis at all.  However I can see the lower aspect of her lumbar spine on the AP view and it does show to significant degenerative changes at the lower lumbar spine.  This could be contributing to her leg pain.  Her right operative hip moves smoothly and fluidly and her left hip moves smoothly and fluidly.  She is walking without a limp or an assistive device and has good strength in her bilateral lower extremities.  She did let me know that she had been on meloxicam for a long period time but had stopped that since surgery.  She is interested in restarting meloxicam and I agree with this as well.  I did send some into her pharmacy.  At this point follow-up for her hip can be as needed.  If she does have worsening bilateral lower extremity symptoms as a relates to her back she will let us know.

## 2022-08-17 NOTE — Progress Notes (Signed)
[facility] [address] [place] [address] Dept: [phone] Dept Fax: [phone]  Chronic Care Office Visit  Subjective:    Patient ID: Gloria Werner, female    DOB: [birth year], 81 y.o..   MRN: [id number]  Chief Complaint  Patient presents with   Follow-up    6 month f/u.     History of Present Illness:  Patient is in today for reassessment of chronic medical issues.  Ms. Fleeger has a history of hypertension. She is managed on amlodipine 5 mg daily.   Ms. Rosch has a history of abdominal atherosclerosis and dyslipidemia. Her cardiologist has her taking rosuvastatin 5 mg three times a week to try and prevent further ASCVD. However, she notes that she continues to have generalized achiness that she attributes to the medicine.   Ms. Arreaga has a history of anxiety with depression . She is managed on bupropion 75 mg daily. Overall, her mood has been doing well.  Ms. Bills underwent a reverse shoulder arthroplasty on the left shoulder in mid-June. She continues with her physical therapy. She has not regained full motion of the shoulder, but is using the arm fairly well.  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   DDD (degenerative disc disease), lumbar 09/28/2019   Aortic atherosclerosis ([place]) 09/28/2019   OSA (obstructive sleep apnea) 01/27/2019   Pulmonary nodule 01/27/2019   Allergic rhinitis 11/06/2018   Upper airway cough syndrome 09/24/2018   Essential hypertension 06/03/2018   Anxiety and depression 01/17/2017   Vitamin D deficiency 01/17/2017   Osteopenia 04/08/2015   Irritable bowel syndrome (IBS) 02/03/2008   Past Surgical  History:  Procedure Laterality Date   ABDOMINAL HYSTERECTOMY  1988   secondary to prolapse   BLADDER SUSPENSION     A-P with Hyst   CATARACT EXTRACTION Bilateral    INSERTION OF MESH  10/16/2018   Procedure: INSERTION OF MESH;  Surgeon: Ardis Hughs, MD;  Location: [facility];  Service: Urology;;   [place] ARTHROPLASTY Left 06/08/2022   Procedure: REVERSE SHOULDER ARTHROPLASTY;  Surgeon: Justice Britain, MD;  Location: [facility];  Service: Orthopedics;  Laterality: Left;   ROBOTIC ASSISTED LAPAROSCOPIC SACROCOLPOPEXY N/A 10/16/2018   Procedure: XI ROBOTIC ASSISTED LAPAROSCOPIC SACROCOLPOPEXY;  Surgeon: Ardis Hughs, MD;  Location: [facility];  Service: Urology;  Laterality: N/A;   TONSILLECTOMY     TOTAL HIP ARTHROPLASTY Right 11/04/2021   Procedure: RIGHT TOTAL HIP ARTHROPLASTY ANTERIOR APPROACH;  Surgeon: Mcarthur Rossetti, MD;  Location: [facility];  Service: Orthopedics;  Laterality: Right;   Family History  Problem Relation Age of Onset   Emphysema Mother    Lymphoma Mother    Asthma Mother    Cancer Father 90       lung cancer   Coronary artery disease Brother    Cancer Brother        breast cancer   Breast cancer Brother    Cancer Brother        lung cancer   Diabetes Neg Hx    Hypertension Neg Hx    Colon cancer Neg Hx    Esophageal cancer Neg Hx    Rectal cancer Neg Hx  Stomach cancer Neg Hx    Outpatient Medications Prior to Visit  Medication Sig Dispense Refill   acetaminophen (TYLENOL) 500 MG tablet Take 500-1,000 mg by mouth at bedtime as needed for moderate pain.     albuterol (VENTOLIN HFA) 108 (90 Base) MCG/ACT inhaler Inhale 1-2 puffs into the lungs every 6 (six) hours as needed. 8 g 2   Biotin 10000 MCG TABS Take 10,000 mcg by mouth daily.     calcium carbonate (OSCAL) 1500 (600 Ca) MG TABS tablet Take 600 mg of elemental calcium by mouth daily with breakfast.     Carboxymethylcellul-Glycerin (LUBRICATING EYE DROPS  OP) Place 1 drop into both eyes daily as needed (dry eyes). Visine     cyclobenzaprine (FLEXERIL) 10 MG tablet Take 1 tablet (10 mg total) by mouth 3 (three) times daily as needed for muscle spasms. 30 tablet 1   Docusate Calcium (STOOL SOFTENER PO) Take 1 tablet by mouth daily.     polyethylene glycol (MIRALAX / GLYCOLAX) 17 g packet Take 17 g by mouth daily. Takes an extra scoop in the evening as needed for moderate-severe constipation.     pyridOXINE (VITAMIN B-6) 100 MG tablet Take 100 mg by mouth daily.     rosuvastatin (CRESTOR) 5 MG tablet TAKE 1 TABLET BY MOUTH THREE TIMES A WEEK 36 tablet 0   saccharomyces boulardii (FLORASTOR) 250 MG capsule Take 2 capsules (500 mg total) by mouth 2 (two) times daily. (Patient taking differently: Take 250 mg by mouth daily.) 120 capsule 3   triamcinolone (NASACORT ALLERGY 24HR) 55 MCG/ACT AERO nasal inhaler Place 1 spray into the nose daily.     vitamin C (ASCORBIC ACID) 500 MG tablet Take 500 mg by mouth daily.     Vitamin D, Ergocalciferol, (DRISDOL) 1.25 MG (50000 UNIT) CAPS capsule Take 1 capsule (50,000 Units total) by mouth every 30 (thirty) days. 3 capsule 3   amLODipine (NORVASC) 5 MG tablet Take 1 tablet (5 mg total) by mouth daily. 90 tablet 1   buPROPion (WELLBUTRIN) 75 MG tablet Take 1 tablet (75 mg total) by mouth daily. 90 tablet 3   meloxicam (MOBIC) 7.5 MG tablet Take 1 tablet (7.5 mg total) by mouth daily. (Patient not taking: Reported on 08/17/2022) 30 tablet 0   No facility-administered medications prior to visit.   Allergies  Allergen Reactions   Lexapro [Escitalopram] Diarrhea and Other (See Comments)    headache   Ace Inhibitors Cough   Codeine Nausea And Vomiting   Objective:   Today's Vitals   08/17/22 0902  BP: 138/80  Pulse: 84  Temp: (!) 96.9 F (36.1 C)  TempSrc: Temporal  SpO2: 97%  Weight: 152 lb 12.8 oz (69.3 kg)  Height: '4\' 11"'$  (1.499 m)   Body mass index is 30.86 kg/m.   General: Well developed, well  nourished. No acute distress. HEENT: Normocephalic, non-traumatic. External ears normal. EAC and TMs normal bilaterally. PERRL, EOMI. Conjunctiva clear. Nose    clear without congestion or rhinorrhea. Mucous membranes moist. Oropharynx clear. Good dentition. Neck: Supple. No lymphadenopathy. No thyromegaly. Lungs: Clear to auscultation bilaterally. No wheezing, rales or rhonchi. CV: RRR without murmurs or rubs. Pulses 2+ bilaterally. Abdomen: Soft, mild generalized tenderness. Bowel sounds positive, normal pitch and frequency. No hepatosplenomegaly. No rebound or guarding. Extremities: No joint swelling or tenderness. Fair ROM of left shoulder with mild limitation of extension and internal rotation. No edema noted. Skin: Warm and dry. No rashes. Psych: Alert and oriented. Normal mood and affect.  Health Maintenance Due  Topic Date Due   INFLUENZA VACCINE  07/25/2022     Lab Results: Lab Results  Component Value Date   CHOL 133 02/17/2022   HDL 48.10 02/17/2022   LDLCALC 69 02/17/2022   LDLDIRECT 84.0 08/03/2020   TRIG 79.0 02/17/2022   CHOLHDL 3 02/17/2022    Assessment & Plan:   1. Essential hypertension Blood pressure is adequately controlled on amlodipine.  - amLODipine (NORVASC) 5 MG tablet; Take 1 tablet (5 mg total) by mouth daily.  Dispense: 90 tablet; Refill: 1  2. Anxiety and depression Stable on bupropion.  - buPROPion (WELLBUTRIN) 75 MG tablet; Take 1 tablet (75 mg total) by mouth daily.  Dispense: 90 tablet; Refill: 3  3. S/P reverse total shoulder arthroplasty, left Function improving with physical therapy.  4. Dyslipidemia We discussed that there may be only marginal benefit to her regarding her related to taking a statin three days a week.  If this is truly the cause of her myalgias, she might consider a trial off of the medicine. If the myalgias improve and the cholesterol does not significantly worsen, she may not need to continue taking this. I will plan to  see her in 6 months and we will reassess her cholesterol level.  Return in about 6 months (around 02/17/2023) for Reassessment.   Haydee Salter, MD

## 2022-08-24 ENCOUNTER — Ambulatory Visit (HOSPITAL_COMMUNITY): Payer: Medicare Other | Attending: Internal Medicine

## 2022-08-24 DIAGNOSIS — I340 Nonrheumatic mitral (valve) insufficiency: Secondary | ICD-10-CM | POA: Insufficient documentation

## 2022-08-24 DIAGNOSIS — R011 Cardiac murmur, unspecified: Secondary | ICD-10-CM | POA: Diagnosis not present

## 2022-08-24 LAB — ECHOCARDIOGRAM COMPLETE
Area-P 1/2: 2.58 cm2
S' Lateral: 2 cm

## 2022-08-29 DIAGNOSIS — M25512 Pain in left shoulder: Secondary | ICD-10-CM | POA: Diagnosis not present

## 2022-09-01 DIAGNOSIS — Z96612 Presence of left artificial shoulder joint: Secondary | ICD-10-CM | POA: Diagnosis not present

## 2022-09-08 ENCOUNTER — Encounter: Payer: Self-pay | Admitting: Family Medicine

## 2022-09-08 ENCOUNTER — Ambulatory Visit (INDEPENDENT_AMBULATORY_CARE_PROVIDER_SITE_OTHER): Payer: Medicare Other | Admitting: Family Medicine

## 2022-09-08 VITALS — BP 126/86 | HR 78 | Temp 98.8°F | Wt 152.8 lb

## 2022-09-08 DIAGNOSIS — R509 Fever, unspecified: Secondary | ICD-10-CM | POA: Diagnosis not present

## 2022-09-08 DIAGNOSIS — J029 Acute pharyngitis, unspecified: Secondary | ICD-10-CM | POA: Diagnosis not present

## 2022-09-08 LAB — POCT INFLUENZA A/B
Influenza A, POC: NEGATIVE
Influenza B, POC: NEGATIVE

## 2022-09-08 LAB — POCT RAPID STREP A (OFFICE): Rapid Strep A Screen: NEGATIVE

## 2022-09-08 LAB — POC COVID19 BINAXNOW: SARS Coronavirus 2 Ag: NEGATIVE

## 2022-09-08 MED ORDER — NYSTATIN 100000 UNIT/ML MT SUSP: 5.0000 mL | Freq: Three times a day (TID) | OROMUCOSAL | 0 refills | Status: DC | PRN

## 2022-09-08 MED ORDER — ACETAMINOPHEN 500 MG PO TABS: 500.0000 mg | ORAL_TABLET | Freq: Every evening | ORAL | 0 refills | Status: AC | PRN

## 2022-09-08 NOTE — Progress Notes (Signed)
Right it is almost 10 you almost a year long long 1 Friday not but not been too exciting but history is more present but mildly warm  Assessment/Plan:   Problem List Items Addressed This Visit       Other   Sore throat    Sore throat Associated with low grade fever Likely viral, cannot rule out bacterial cause COVID eng Influenza neg Strep neg Recommend OTC supportive care e.g.  ibuprofen, tylenol Add magic mouth wash Return precautions discussed Follow-up as needed       Relevant Medications   magic mouthwash (nystatin, lidocaine, diphenhydrAMINE) suspension   acetaminophen (TYLENOL) 500 MG tablet   Other Relevant Orders   POCT rapid strep A (Completed)   Other Visit Diagnoses     Fever, unspecified fever cause    -  Primary   Relevant Medications   magic mouthwash (nystatin, lidocaine, diphenhydrAMINE) suspension   acetaminophen (TYLENOL) 500 MG tablet   Other Relevant Orders   POC COVID-19 (Completed)   POCT Influenza A/B (Completed)          Subjective:  HPI:  Gloria Werner is a 81 y.o. female who has Irritable bowel syndrome (IBS); Osteopenia; Anxiety and depression; Vitamin D deficiency; Essential hypertension; Upper airway cough syndrome; Allergic rhinitis; OSA (obstructive sleep apnea); Pulmonary nodule; DDD (degenerative disc disease), lumbar; Aortic atherosclerosis ([place]); Status post total replacement of right hip; Diverticulosis; Dyslipidemia; History of Clostridium difficile infection; S/P reverse total shoulder arthroplasty, left; and Sore throat on their problem list..   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   She presents with chief complaint of Fever (Fever since Sunday (100); today it was 99 and chills) .   Sore Throat: Patient complains of sore throat. Associated symptoms include low grade fevers and chills and chills.Onset of symptoms was 5 days ago, gradually improving since that time. She is drinking plenty of fluids. She has not had recent close exposure to someone with proven streptococcal pharyngitis.  At home COVID test were negative x2. The patient denies cough, chest pain, dyspnea, wheezing or hemoptysis, rash, abdominal pain.   Past Surgical History:  Procedure Laterality Date   ABDOMINAL HYSTERECTOMY  1988   secondary to prolapse   BLADDER SUSPENSION     A-P with Hyst   CATARACT EXTRACTION Bilateral    INSERTION OF MESH  10/16/2018   Procedure: INSERTION OF MESH;  Surgeon: Ardis Hughs, MD;  Location: [facility];  Service: Urology;;   NASAL SINUS SURGERY  1970   REVERSE SHOULDER ARTHROPLASTY Left 06/08/2022   Procedure: REVERSE SHOULDER ARTHROPLASTY;  Surgeon: Justice Britain, MD;  Location: [facility];  Service: Orthopedics;  Laterality: Left;   ROBOTIC ASSISTED LAPAROSCOPIC SACROCOLPOPEXY N/A 10/16/2018   Procedure: XI ROBOTIC ASSISTED LAPAROSCOPIC SACROCOLPOPEXY;  Surgeon: Ardis Hughs, MD;  Location: [facility];  Service: Urology;  Laterality: N/A;   TONSILLECTOMY     TOTAL HIP ARTHROPLASTY Right 11/04/2021   Procedure: RIGHT TOTAL HIP ARTHROPLASTY ANTERIOR APPROACH;  Surgeon: Mcarthur Rossetti, MD;  Location: [facility];  Service: Orthopedics;  Laterality: Right;    Outpatient Medications Prior to Visit  Medication Sig Dispense Refill   albuterol (VENTOLIN HFA) 108 (90 Base) MCG/ACT inhaler Inhale  1-2 puffs into the lungs every 6 (six) hours as needed. 8 g 2   amLODipine (NORVASC) 5 MG tablet Take 1 tablet (5 mg total) by mouth daily. 90 tablet 1   Biotin 10000 MCG TABS Take 10,000 mcg by mouth  daily.     buPROPion (WELLBUTRIN) 75 MG tablet Take 1 tablet (75 mg total) by mouth daily. 90 tablet 3   calcium carbonate (OSCAL) 1500 (600 Ca) MG TABS tablet Take 600 mg of elemental calcium by mouth daily with breakfast.     Carboxymethylcellul-Glycerin (LUBRICATING EYE DROPS OP) Place 1 drop into both eyes daily as needed (dry eyes). Visine     cyclobenzaprine (FLEXERIL) 10 MG tablet Take 1 tablet (10 mg total) by mouth 3 (three) times daily as needed for muscle spasms. 30 tablet 1   Docusate Calcium (STOOL SOFTENER PO) Take 1 tablet by mouth daily.     meloxicam (MOBIC) 15 MG tablet Take 1 tablet (15 mg total) by mouth daily as needed for pain. 30 tablet 6   polyethylene glycol (MIRALAX / GLYCOLAX) 17 g packet Take 17 g by mouth daily. Takes an extra scoop in the evening as needed for moderate-severe constipation.     pyridOXINE (VITAMIN B-6) 100 MG tablet Take 100 mg by mouth daily.     saccharomyces boulardii (FLORASTOR) 250 MG capsule Take 2 capsules (500 mg total) by mouth 2 (two) times daily. (Patient taking differently: Take 250 mg by mouth daily.) 120 capsule 3   triamcinolone (NASACORT ALLERGY 24HR) 55 MCG/ACT AERO nasal inhaler Place 1 spray into the nose daily.     vitamin C (ASCORBIC ACID) 500 MG tablet Take 500 mg by mouth daily.     Vitamin D, Ergocalciferol, (DRISDOL) 1.25 MG (50000 UNIT) CAPS capsule Take 1 capsule (50,000 Units total) by mouth every 30 (thirty) days. 3 capsule 3   acetaminophen (TYLENOL) 500 MG tablet Take 500-1,000 mg by mouth at bedtime as needed for moderate pain.     No facility-administered medications prior to visit.    Family History  Problem Relation Age of Onset   Emphysema Mother    Lymphoma Mother    Asthma Mother    Cancer Father 19       lung cancer   Coronary artery disease Brother    Cancer Brother        breast cancer   Breast cancer Brother    Cancer Brother        lung cancer   Diabetes Neg Hx    Hypertension Neg Hx    Colon  cancer Neg Hx    Esophageal cancer Neg Hx    Rectal cancer Neg Hx    Stomach cancer Neg Hx     Social History   Socioeconomic History   Marital status: Married    Spouse name: Ilona Sorrel   Number of children: 2   Years of education: 12   Highest education level: Not on file  Occupational History   Occupation: Environmental health practitioner   Occupation: Retired  Tobacco Use   Smoking status: Never   Smokeless tobacco: Never  Scientific laboratory technician Use: Never used  Substance and Sexual Activity   Alcohol use: No   Drug use: No   Sexual activity: Not Currently    Partners: Male    Birth control/protection: Surgical    Comment: hysterectomy  Other Topics Concern   Not on file  Social History Narrative   HSG. Married '63. 1 son- 69'; 1-daughter-'68;  grandchildren 3.  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 Provided lead to [email or web address].   Social Determinants of Health   Financial Resource Strain: Low Risk  (10/17/2021)   Overall Financial Resource Strain (CARDIA)    Difficulty of Paying Living Expenses: Not hard at all  Food Insecurity: No Food Insecurity (09/20/2021)   Hunger Vital Sign    Worried About Running Out of Food in the Last Year: Never true    Ran Out of Food in the Last Year: Never true  Transportation Needs: No Transportation Needs (09/20/2021)   PRAPARE - Hydrologist (Medical): No    Lack of Transportation (Non-Medical): No  Physical Activity: Insufficiently Active (09/20/2021)   Exercise Vital Sign    Days of Exercise per Week: 4 days    Minutes of Exercise per Session: 30 min  Stress: No Stress Concern Present (09/20/2021)   [place]    Feeling of Stress : Not at all  Social Connections: Moderately Integrated (09/20/2021)   Social Connection and Isolation Panel [NHANES]    Frequency of Communication with Friends and Family: More than  three times a week    Frequency of Social Gatherings with Friends and Family: More than three times a week    Attends Religious Services: More than 4 times per year    Active Member of [employer] or Organizations: No    Attends Archivist Meetings: Never    Marital Status: Married  Human resources officer Violence: Not At Risk (09/20/2021)   Humiliation, Afraid, Rape, and Kick questionnaire    Fear of Current or Ex-Partner: No    Emotionally Abused: No    Physically Abused: No    Sexually Abused: No                                                                                                 Objective:  Physical Exam: BP 126/86 (BP Location: Left Arm, Patient Position: Sitting, Cuff Size: Large)   Pulse 78   Temp 98.8 F (37.1 C) (Oral)   Wt 152 lb 12.8 oz (69.3 kg)   LMP 12/25/1986 (Approximate)   SpO2 98%   BMI 30.86 kg/m    General: No acute distress. Awake and conversant.  Eyes: Normal conjunctiva, anicteric. Round symmetric pupils.  ENT: Hearing grossly intact. No nasal discharge.  No oropharyngeal lesions, MMM Neck: Neck is supple. No masses or thyromegaly.  No lymphadenopathy Respiratory: Respirations are non-labored. No auditory wheezing.  CTA B Skin: Warm. No rashes or ulcers.  Psych: Alert and oriented. Cooperative, Appropriate mood and affect, Normal judgment.  CV: No cyanosis or JVD, RRR, no MRG MSK: Normal ambulation. No clubbing  Neuro: Sensation and CN II-XII grossly normal.   Results for orders placed or performed in visit on 09/08/22 (from the past 24 hour(s))  POC COVID-19     Status: None   Collection Time: 09/08/22  4:20 PM  Result Value Ref Range   SARS Coronavirus 2 Ag Negative Negative  POCT rapid strep A  Status: None   Collection Time: 09/08/22  4:21 PM  Result Value Ref Range   Rapid Strep A Screen Negative Negative  POCT Influenza A/B     Status: None   Collection Time: 09/08/22  4:21 PM  Result Value Ref Range   Influenza A, POC  Negative Negative   Influenza B, POC Negative Negative        Alesia Banda, MD, MS

## 2022-09-08 NOTE — Assessment & Plan Note (Signed)
Sore throat Associated with low grade fever Likely viral, cannot rule out bacterial cause COVID eng Influenza neg Strep neg Recommend OTC supportive care e.g.  ibuprofen, tylenol Add magic mouth wash Return precautions discussed Follow-up as needed

## 2022-09-11 ENCOUNTER — Emergency Department (HOSPITAL_COMMUNITY)
Admission: EM | Admit: 2022-09-11 | Discharge: 2022-09-12 | Disposition: A | Discharge status: Home / Self Care | Payer: Medicare Other | Attending: Emergency Medicine | Admitting: Emergency Medicine

## 2022-09-11 ENCOUNTER — Other Ambulatory Visit: Payer: Self-pay

## 2022-09-11 ENCOUNTER — Encounter (HOSPITAL_COMMUNITY): Payer: Self-pay | Admitting: Emergency Medicine

## 2022-09-11 ENCOUNTER — Emergency Department (HOSPITAL_COMMUNITY): Payer: Medicare Other

## 2022-09-11 DIAGNOSIS — Z79899 Other long term (current) drug therapy: Secondary | ICD-10-CM | POA: Insufficient documentation

## 2022-09-11 DIAGNOSIS — M25552 Pain in left hip: Secondary | ICD-10-CM | POA: Diagnosis not present

## 2022-09-11 DIAGNOSIS — S80811A Abrasion, right lower leg, initial encounter: Secondary | ICD-10-CM | POA: Insufficient documentation

## 2022-09-11 DIAGNOSIS — S0012XA Contusion of left eyelid and periocular area, initial encounter: Secondary | ICD-10-CM | POA: Insufficient documentation

## 2022-09-11 DIAGNOSIS — R930 Abnormal findings on diagnostic imaging of skull and head, not elsewhere classified: Secondary | ICD-10-CM | POA: Insufficient documentation

## 2022-09-11 DIAGNOSIS — I2510 Atherosclerotic heart disease of native coronary artery without angina pectoris: Secondary | ICD-10-CM | POA: Insufficient documentation

## 2022-09-11 DIAGNOSIS — S0993XA Unspecified injury of face, initial encounter: Secondary | ICD-10-CM | POA: Diagnosis present

## 2022-09-11 DIAGNOSIS — I10 Essential (primary) hypertension: Secondary | ICD-10-CM | POA: Insufficient documentation

## 2022-09-11 DIAGNOSIS — S0003XA Contusion of scalp, initial encounter: Secondary | ICD-10-CM | POA: Diagnosis not present

## 2022-09-11 DIAGNOSIS — M542 Cervicalgia: Secondary | ICD-10-CM | POA: Diagnosis not present

## 2022-09-11 DIAGNOSIS — Z23 Encounter for immunization: Secondary | ICD-10-CM | POA: Diagnosis not present

## 2022-09-11 DIAGNOSIS — M25551 Pain in right hip: Secondary | ICD-10-CM | POA: Diagnosis not present

## 2022-09-11 DIAGNOSIS — Z85828 Personal history of other malignant neoplasm of skin: Secondary | ICD-10-CM | POA: Insufficient documentation

## 2022-09-11 DIAGNOSIS — M545 Low back pain, unspecified: Secondary | ICD-10-CM | POA: Diagnosis not present

## 2022-09-11 DIAGNOSIS — W19XXXA Unspecified fall, initial encounter: Secondary | ICD-10-CM

## 2022-09-11 DIAGNOSIS — Y9301 Activity, walking, marching and hiking: Secondary | ICD-10-CM | POA: Diagnosis not present

## 2022-09-11 DIAGNOSIS — W1830XA Fall on same level, unspecified, initial encounter: Secondary | ICD-10-CM | POA: Insufficient documentation

## 2022-09-11 MED ORDER — TETANUS-DIPHTH-ACELL PERTUSSIS 5-2.5-18.5 LF-MCG/0.5 IM SUSY
0.5000 mL | PREFILLED_SYRINGE | Freq: Once | INTRAMUSCULAR | Status: AC
  Administered 2022-09-12: 0.5 mL via INTRAMUSCULAR
  Filled 2022-09-11: qty 0.5

## 2022-09-11 NOTE — ED Provider Triage Note (Signed)
Emergency Medicine Provider Triage Evaluation Note  Gloria Werner , a 81 y.o. female  was evaluated in triage.  Pt complains of fall, states that she was walking the stairs lost her balance causing fall onto her right side, states that she struck her head on the stairs, denies loss of conscious, states that she had to crawl for help, no endorsing any headaches change in vision numbness or tingling arms or legs, no chest pain back pain neck pain or abdominal pain.  She is not on blood thinners.  Main complaint is her headache..  Review of Systems  Positive: Head pain, abrasion to the leg Negative: Nausea or vomiting  Physical Exam  BP (!) 161/90 (BP Location: Right Arm)   Pulse 86   Temp 98.4 F (36.9 C) (Oral)   Resp 16   Ht '4\' 11"'$  (1.499 m)   Wt 69.3 kg   LMP 12/25/1986 (Approximate)   SpO2 97%   BMI 30.86 kg/m  Gen:   Awake, no distress   Resp:  Normal effort  MSK:   Moves extremities without difficulty  Other:  No facial asymmetry no difficulty with word finding following two-step commands no regular weakness present.  Medical Decision Making  Medically screening exam initiated at 11:39 PM.  Appropriate orders placed.  Gloria Werner was informed that the remainder of the evaluation will be completed by another provider, this initial triage assessment does not replace that evaluation, and the importance of remaining in the ED until their evaluation is complete.  Imaging has been ordered will need further work-up.   Marcello Fennel, PA-C 09/11/22 2341

## 2022-09-11 NOTE — ED Triage Notes (Signed)
Pt states she lost her balance and fell to the right.  He did hit her head on the right side of her head.  She denies LOC or blood thinners.  She does have an wound w/ controlled bleeding to the right lower leg.

## 2022-09-12 ENCOUNTER — Ambulatory Visit: Payer: Medicare Other | Admitting: Family Medicine

## 2022-09-12 DIAGNOSIS — S0003XA Contusion of scalp, initial encounter: Secondary | ICD-10-CM | POA: Diagnosis not present

## 2022-09-12 NOTE — ED Provider Notes (Signed)
[facility] EMERGENCY DEPARTMENT Provider Note   CSN: [id number] Arrival date & time: 09/11/22  2147     History Past Medical History:  Diagnosis Date   Allergic rhinitis due to pollen    Cancer [facility])    skin   Cataract    NS OU   Complication of anesthesia    Coronary artery disease    Cough    De Quervain's syndrome (tenosynovitis) 07/03/2018   Gastroesophageal reflux disease without esophagitis    Heart murmur    Hypertension    Irritable bowel syndrome    Lichen simplex chronicus 09/2004   PONV (postoperative nausea and vomiting)    Unilateral primary osteoarthritis, 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 female.  Patient reports the night of presenting to the ED she was walking from her back porch to a shed in her backyard.  She said there were 2 steps that she had to navigate and she unfortunately either missed a step or one of her legs gave away causing her to fall.  She denies any loss of consciousness, presyncope, acute changes in vision, or any preceding symptoms such as palpitations, nausea vomiting, diaphoresis.  Patient does have multiple MSK complaints but notes that these are chronic and are being worked up in the outpatient setting.  She does state that she has some neck stiffness after the fall as well as some lower back pain.  The history is provided by the patient. No language interpreter was used.  Fall       Home Medications Prior to Admission medications   Medication Sig Start Date End Date Taking? Authorizing Provider  acetaminophen (TYLENOL) 500 MG tablet Take 1-2 tablets (500-1,000 mg total) by mouth at bedtime as needed for moderate pain. 09/08/22   Bonnita Hollow, MD  albuterol (VENTOLIN HFA) 108 (90 Base) MCG/ACT inhaler Inhale 1-2 puffs into the lungs every 6 (six) hours as needed. 02/14/22   Parrett, Fonnie Mu, NP  amLODipine (NORVASC) 5 MG tablet Take  1 tablet (5 mg total) by mouth daily. 08/17/22   Haydee Salter, MD  Biotin 10000 MCG TABS Take 10,000 mcg by mouth daily.    [provider]  buPROPion (WELLBUTRIN) 75 MG tablet Take 1 tablet (75 mg total) by mouth daily. 08/17/22   Haydee Salter, MD  calcium carbonate (OSCAL) 1500 (600 Ca) MG TABS tablet Take 600 mg of elemental calcium by mouth daily with breakfast.    [provider]  Carboxymethylcellul-Glycerin (LUBRICATING EYE DROPS OP) Place 1 drop into both eyes daily as needed (dry eyes). Visine    [provider]  cyclobenzaprine (FLEXERIL) 10 MG tablet Take 1 tablet (10 mg total) by mouth 3 (three) times daily as needed for muscle spasms. 06/08/22   Shuford, Olivia Mackie, PA-C  Docusate Calcium (STOOL SOFTENER PO) Take 1 tablet by mouth daily.    [provider]  magic mouthwash (nystatin, lidocaine, diphenhydrAMINE) suspension Take 5 mLs by mouth 3 (three) times daily as needed (sore throat). 09/08/22   Bonnita Hollow, MD  meloxicam (MOBIC) 15 MG tablet Take 1 tablet (15 mg total) by mouth daily as needed for pain. 08/17/22   Mcarthur Rossetti, MD  polyethylene glycol (MIRALAX / GLYCOLAX) 17 g packet Take 17 g by mouth daily. Takes an extra scoop in the evening as needed for moderate-severe constipation.    [provider]  pyridOXINE (VITAMIN B-6) 100 MG tablet Take 100 mg by mouth daily.    [provider]  saccharomyces boulardii (FLORASTOR) 250 MG capsule Take 2 capsules (500 mg total) by mouth 2 (two) times daily. Patient taking differently: Take 250 mg by mouth daily. 11/05/15   Esterwood, Amy S, PA-C  triamcinolone (NASACORT ALLERGY 24HR) 55 MCG/ACT AERO nasal inhaler Place 1 spray into the nose daily.    [provider]  vitamin C (ASCORBIC ACID) 500 MG tablet Take 500 mg by mouth daily.    [provider]  Vitamin D, Ergocalciferol, (DRISDOL) 1.25 MG (50000 UNIT) CAPS capsule Take 1 capsule (50,000 Units  total) by mouth every 30 (thirty) days. 02/17/22   Haydee Salter, MD      Allergies    Lexapro [escitalopram], Ace inhibitors, and Codeine    Review of Systems   Review of Systems  All other systems reviewed and are negative.   Physical Exam Updated Vital Signs BP (!) 167/91   Pulse 80   Temp 98 F (36.7 C) (Oral)   Resp 16   Ht '4\' 11"'$  (1.499 m)   Wt 69.3 kg   LMP 12/25/1986 (Approximate)   SpO2 100%   BMI 30.86 kg/m  Physical Exam Constitutional:      General: She is not in acute distress.    Appearance: Normal appearance. She is not ill-appearing.  HENT:     Head: Normocephalic and atraumatic.  Eyes:     Extraocular Movements: Extraocular movements intact.     Comments: Left eye periorbital ecchymosis  Cardiovascular:     Rate and Rhythm: Normal rate and regular rhythm.     Heart sounds: Normal heart sounds.  Pulmonary:     Effort: Pulmonary effort is normal.     Breath sounds: Normal breath sounds.  Abdominal:     General: Abdomen is flat.     Palpations: Abdomen is soft.     Tenderness: There is no abdominal tenderness.  Musculoskeletal:        General: No swelling or tenderness.     Cervical back: Normal range of motion and neck supple. No rigidity or tenderness.     Comments: Mildly antalgic gait.  Patient favors right side  Skin:    Findings: Lesion present.     Comments: Lateral aspect of right lower extremity with abrasion  Neurological:     General: No focal deficit present.     Mental Status: She is alert and oriented to person, place, and time.     Motor: No weakness.     Coordination: Coordination normal.     Gait: Gait normal.  Psychiatric:        Mood and Affect: Mood normal.     ED Results / Procedures / Treatments   Labs (all labs ordered are listed, but only abnormal results are displayed) Labs Reviewed - No data to display  EKG None  Radiology CT Head Wo Contrast  Result Date: 09/12/2022 CLINICAL DATA:  Head trauma, minor  (Age >= 65y); Facial trauma, blunt. Vertigo, fall on stairs EXAM: CT HEAD WITHOUT CONTRAST CT MAXILLOFACIAL WITHOUT CONTRAST TECHNIQUE: Multidetector CT imaging of the head and maxillofacial structures were performed using the standard protocol without intravenous contrast. Multiplanar CT image reconstructions of the maxillofacial structures were also generated. RADIATION DOSE REDUCTION: This exam was performed according to the departmental dose-optimization program which includes automated exposure control, adjustment of the mA and/or kV according to patient size and/or use  of iterative reconstruction technique. COMPARISON:  None Available. FINDINGS: CT HEAD FINDINGS Brain: Normal anatomic configuration. Parenchymal volume loss is commensurate with the patient's age. Mild periventricular white matter changes are present likely reflecting the sequela of small vessel ischemia. No abnormal intra or extra-axial mass lesion or fluid collection. No abnormal mass effect or midline shift. No evidence of acute intracranial hemorrhage or infarct. Ventricular size is normal. Cerebellum unremarkable. Vascular: No asymmetric hyperdense vasculature at the skull base. Skull: Intact. Stable large arachnoid granulation within the left occipital bone. Other: Mastoid air cells and middle ear cavities are clear. Moderate left frontal scalp hematoma. CT MAXILLOFACIAL FINDINGS Osseous: No fracture or mandibular dislocation. No destructive process. Orbits: Ocular lenses have been removed. Moderate left preseptal soft tissue swelling. Orbits are otherwise unremarkable. Sinuses: Paranasal sinuses are clear. Soft tissues: Otherwise unremarkable. IMPRESSION: 1. No acute intracranial abnormality. No calvarial fracture. Moderate left frontal scalp hematoma. 2. No acute facial fracture. 3. Moderate left preseptal soft tissue swelling. Electronically Signed   By: Fidela Salisbury M.D.   On: 09/12/2022 00:22   CT Maxillofacial Wo  Contrast  Result Date: 09/12/2022 CLINICAL DATA:  Head trauma, minor (Age >= 65y); Facial trauma, blunt. Vertigo, fall on stairs EXAM: CT HEAD WITHOUT CONTRAST CT MAXILLOFACIAL WITHOUT CONTRAST TECHNIQUE: Multidetector CT imaging of the head and maxillofacial structures were performed using the standard protocol without intravenous contrast. Multiplanar CT image reconstructions of the maxillofacial structures were also generated. RADIATION DOSE REDUCTION: This exam was performed according to the departmental dose-optimization program which includes automated exposure control, adjustment of the mA and/or kV according to patient size and/or use of iterative reconstruction technique. COMPARISON:  None Available. FINDINGS: CT HEAD FINDINGS Brain: Normal anatomic configuration. Parenchymal volume loss is commensurate with the patient's age. Mild periventricular white matter changes are present likely reflecting the sequela of small vessel ischemia. No abnormal intra or extra-axial mass lesion or fluid collection. No abnormal mass effect or midline shift. No evidence of acute intracranial hemorrhage or infarct. Ventricular size is normal. Cerebellum unremarkable. Vascular: No asymmetric hyperdense vasculature at the skull base. Skull: Intact. Stable large arachnoid granulation within the left occipital bone. Other: Mastoid air cells and middle ear cavities are clear. Moderate left frontal scalp hematoma. CT MAXILLOFACIAL FINDINGS Osseous: No fracture or mandibular dislocation. No destructive process. Orbits: Ocular lenses have been removed. Moderate left preseptal soft tissue swelling. Orbits are otherwise unremarkable. Sinuses: Paranasal sinuses are clear. Soft tissues: Otherwise unremarkable. IMPRESSION: 1. No acute intracranial abnormality. No calvarial fracture. Moderate left frontal scalp hematoma. 2. No acute facial fracture. 3. Moderate left preseptal soft tissue swelling. Electronically Signed   By: Fidela Salisbury M.D.   On: 09/12/2022 00:22      Medications Ordered in ED Medications  Tdap (BOOSTRIX) injection 0.5 mL (0.5 mLs Intramuscular Given 09/12/22 0920)    ED Course/ Medical Decision Making/ A&P                           Medical Decision Making Patient with likely mechanical fall.  She takes amlodipine and meloxicam for bilateral hip pain.  Low suspicion for orthostasis.  She had no syncope or presyncope.  Her last A1c was within normal limits.  She has not had any vitamin studies performed recently.  She has had imaging was negative for intracranial bleed or fracture to the orbit or face.  Patient did note some neck stiffness and lower back discomfort but felt these were  related to her recent fall and did not feel imaging was warranted after discussion.  Discussed with patient that she should follow-up with her primary care physician this week to ensure that her symptoms have completely improved.   Final Clinical Impression(s) / ED Diagnoses Final diagnoses:  None    Rx / DC Orders ED Discharge Orders     None         Rick Duff, MD 09/12/22 [place], [place], DO 09/12/22 1256

## 2022-09-12 NOTE — Discharge Instructions (Addendum)
If you develop a worsening headache or other pain that is not relieved with over the counter medications please return to the ED for further evaluation. Please schedule an appointment with your primary care physician this week or early next week to ensure that your pains from the fall have resolved. You may also need vitamin studies performed at that time. Please also ensure that you mention your chronic issues.

## 2022-09-20 ENCOUNTER — Encounter: Payer: Self-pay | Admitting: Family Medicine

## 2022-09-20 ENCOUNTER — Ambulatory Visit (INDEPENDENT_AMBULATORY_CARE_PROVIDER_SITE_OTHER): Payer: Medicare Other | Admitting: Family Medicine

## 2022-09-20 VITALS — BP 144/90 | HR 85 | Temp 97.5°F | Wt 153.2 lb

## 2022-09-20 DIAGNOSIS — W108XXD Fall (on) (from) other stairs and steps, subsequent encounter: Secondary | ICD-10-CM

## 2022-09-20 DIAGNOSIS — S0083XD Contusion of other part of head, subsequent encounter: Secondary | ICD-10-CM | POA: Diagnosis not present

## 2022-09-20 DIAGNOSIS — S7001XD Contusion of right hip, subsequent encounter: Secondary | ICD-10-CM

## 2022-09-20 NOTE — Progress Notes (Signed)
[facility] [address] [place] [address] Dept: [phone] Dept Fax: [phone]  Office Visit  Subjective:    Patient ID: Gloria Werner, female    DOB: Oct 08, 1941, 81 y.o..   MRN: [id number]  Chief Complaint  Patient presents with   Follow-up    F/U  ER visit after fall. [place] three days later, didn't go to the ER. Fell on rt side hip.tender to the touch.Tylenol for pain w/ a little relief. Pt stated having pain in the mid rib area. Lt Eye fuzzy.    History of Present Illness:  Patient is in today for follow-up form a recent ED visit. She had experienced a fall at home. She was coming down some steps at the rear of the house and apparently lost her balance. she is not sure what she may have fallen into. However, she had a hematoma develop on her left forehead. she also abraded her right lower leg.  She was seen at [facility] on 9/18. X-rays were normal. She notes that three days later, she fell again. This time she was walking a dog on a leash. Coming back int he house, the dog suddenly pulled on the leash, causing her to lose her balance and fall over on her right hip. Ms. Rape has a history of a prior right hip joint replacement. She notes that she has had some tenderness in the right thigh, but notes she is able to ambulate without trouble.  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   DDD (degenerative disc disease), lumbar 09/28/2019   Aortic atherosclerosis ([place]) 09/28/2019   OSA (obstructive sleep apnea) 01/27/2019   Pulmonary nodule 01/27/2019   Allergic rhinitis 11/06/2018   Upper airway cough syndrome 09/24/2018   Essential  hypertension 06/03/2018   Anxiety and depression 01/17/2017   Vitamin D deficiency 01/17/2017   Osteopenia 04/08/2015   Irritable bowel syndrome (IBS) 02/03/2008    Past Surgical History:  Procedure Laterality Date   ABDOMINAL HYSTERECTOMY  1988   secondary to prolapse   BLADDER SUSPENSION     A-P with Hyst   CATARACT EXTRACTION Bilateral    INSERTION OF MESH  10/16/2018   Procedure: INSERTION OF MESH;  Surgeon: Ardis Hughs, MD;  Location: [facility];  Service: Urology;;   [place] ARTHROPLASTY Left 06/08/2022   Procedure: REVERSE SHOULDER ARTHROPLASTY;  Surgeon: Justice Britain, MD;  Location: [facility];  Service: Orthopedics;  Laterality: Left;   ROBOTIC ASSISTED LAPAROSCOPIC SACROCOLPOPEXY N/A 10/16/2018   Procedure: XI ROBOTIC ASSISTED LAPAROSCOPIC SACROCOLPOPEXY;  Surgeon: Ardis Hughs, MD;  Location: [facility];  Service: Urology;  Laterality: N/A;   TONSILLECTOMY     TOTAL HIP ARTHROPLASTY Right 11/04/2021   Procedure: RIGHT TOTAL HIP ARTHROPLASTY ANTERIOR APPROACH;  Surgeon: Mcarthur Rossetti, MD;  Location: [facility];  Service: Orthopedics;  Laterality: Right;    Family History  Problem Relation Age of Onset   Emphysema Mother    Lymphoma Mother    Asthma Mother    Cancer Father 28       lung cancer   Coronary artery disease Brother    Cancer Brother  breast cancer   Breast cancer Brother    Cancer Brother        lung cancer   Diabetes Neg Hx    Hypertension Neg Hx    Colon cancer Neg Hx    Esophageal cancer Neg Hx    Rectal cancer Neg Hx    Stomach cancer Neg Hx     Outpatient Medications Prior to Visit  Medication Sig Dispense Refill   acetaminophen (TYLENOL) 500 MG tablet Take 1-2 tablets (500-1,000 mg total) by mouth at bedtime as needed for moderate pain. 30 tablet 0   albuterol (VENTOLIN HFA) 108 (90 Base) MCG/ACT inhaler Inhale 1-2 puffs into the lungs every 6 (six) hours as needed. 8 g 2   amLODipine  (NORVASC) 5 MG tablet Take 1 tablet (5 mg total) by mouth daily. 90 tablet 1   Biotin 10000 MCG TABS Take 10,000 mcg by mouth daily.     buPROPion (WELLBUTRIN) 75 MG tablet Take 1 tablet (75 mg total) by mouth daily. 90 tablet 3   calcium carbonate (OSCAL) 1500 (600 Ca) MG TABS tablet Take 600 mg of elemental calcium by mouth daily with breakfast.     Carboxymethylcellul-Glycerin (LUBRICATING EYE DROPS OP) Place 1 drop into both eyes daily as needed (dry eyes). Visine     cyclobenzaprine (FLEXERIL) 10 MG tablet Take 1 tablet (10 mg total) by mouth 3 (three) times daily as needed for muscle spasms. 30 tablet 1   Docusate Calcium (STOOL SOFTENER PO) Take 1 tablet by mouth daily.     meloxicam (MOBIC) 15 MG tablet Take 1 tablet (15 mg total) by mouth daily as needed for pain. 30 tablet 6   polyethylene glycol (MIRALAX / GLYCOLAX) 17 g packet Take 17 g by mouth daily. Takes an extra scoop in the evening as needed for moderate-severe constipation.     pyridOXINE (VITAMIN B-6) 100 MG tablet Take 100 mg by mouth daily.     saccharomyces boulardii (FLORASTOR) 250 MG capsule Take 2 capsules (500 mg total) by mouth 2 (two) times daily. (Patient taking differently: Take 250 mg by mouth daily.) 120 capsule 3   triamcinolone (NASACORT ALLERGY 24HR) 55 MCG/ACT AERO nasal inhaler Place 1 spray into the nose daily.     vitamin C (ASCORBIC ACID) 500 MG tablet Take 500 mg by mouth daily.     Vitamin D, Ergocalciferol, (DRISDOL) 1.25 MG (50000 UNIT) CAPS capsule Take 1 capsule (50,000 Units total) by mouth every 30 (thirty) days. 3 capsule 3   magic mouthwash (nystatin, lidocaine, diphenhydrAMINE) suspension Take 5 mLs by mouth 3 (three) times daily as needed (sore throat). (Patient not taking: Reported on 09/20/2022) 180 mL 0   No facility-administered medications prior to visit.    Allergies  Allergen Reactions   Lexapro [Escitalopram] Diarrhea and Other (See Comments)    headache   Ace Inhibitors Cough    Codeine Nausea And Vomiting      Objective:   Today's Vitals   09/20/22 1313  BP: (!) 144/90  Pulse: 85  Temp: (!) 97.5 F (36.4 C)  TempSrc: Temporal  SpO2: 96%  Weight: 153 lb 3.2 oz (69.5 kg)   Body mass index is 30.94 kg/m.   General: Well developed, well nourished. No acute distress. HEENT: Normocephalic. There is ecchymosis around the left eye and forehead. PERRL,   EOMI. Conjunctiva clear. Extremities: Abrasions on right shin that are helaing. There is mild tenderness ot deep   palpation over the lateral right thigh. FROM of  hip. No increased pain with FABER or resisted   internal rotation.  Psych: Alert and oriented. Normal mood and affect.  Health Maintenance Due  Topic Date Due   INFLUENZA VACCINE  07/25/2022   Imaging: CT Maxillofacial wo contrast (09/11/2022) CT of Head wo contrast (09/11/2022) IMPRESSION: 1. No acute intracranial abnormality. No calvarial fracture. Moderate left frontal scalp hematoma. 2. No acute facial fracture. 3. Moderate left preseptal soft tissue swelling.    Assessment & Plan:   1. Fall (on) (from) other stairs and steps, subsequent encounter I am concerned with Ms. Tedeschi having had two falls recently. I recommend we have her seen for balance assessment and strengthening to try and repeat further falls.  - Ambulatory referral to Physical Therapy  2. Contusion of face, subsequent encounter Improving. Will monitor for now.  3. Contusion of right hip, subsequent encounter Improving. No sign of fracture. Will monitor for now   Return for As scheduled.   Haydee Salter, MD

## 2022-09-21 ENCOUNTER — Telehealth: Payer: Self-pay

## 2022-09-21 NOTE — Telephone Encounter (Signed)
     Patient  visit on 9/19  at [facility] Have you been able to follow up with your primary care physician?YES  The patient was or was not able to obtain any needed medicine or equipment.YES  Are there diet recommendations that you are having difficulty following?NA  Patient expresses understanding of discharge instructions and education provided has no other needs at this time. D[place], [facility], Care Management  [phone] [address], [place], [place] [address] Phone: [phone] Email: [email or web address]

## 2022-09-25 DIAGNOSIS — Z961 Presence of intraocular lens: Secondary | ICD-10-CM | POA: Diagnosis not present

## 2022-09-25 DIAGNOSIS — H33312 Horseshoe tear of retina without detachment, left eye: Secondary | ICD-10-CM | POA: Diagnosis not present

## 2022-09-25 DIAGNOSIS — H04123 Dry eye syndrome of bilateral lacrimal glands: Secondary | ICD-10-CM | POA: Diagnosis not present

## 2022-09-25 DIAGNOSIS — H2000 Unspecified acute and subacute iridocyclitis: Secondary | ICD-10-CM | POA: Diagnosis not present

## 2022-09-27 NOTE — Therapy (Signed)
OUTPATIENT PHYSICAL THERAPY LOWER EXTREMITY EVALUATION   Patient Name: Gloria Werner MRN: [id number] DOB:02-Dec-1941, 81 y.o., female Today's Date: 09/28/2022   PT End of Session - 09/28/22 1508     Visit Number 1    Number of Visits --   one time visit   PT Start Time 1400    PT Stop Time 1440    PT Time Calculation (min) 40 min    Activity Tolerance Patient tolerated treatment well    Behavior During Therapy [facility] for tasks assessed/performed             Past Medical History:  Diagnosis Date   Allergic rhinitis due to pollen    Cancer [facility])    skin   Cataract    NS OU   Complication of anesthesia    Coronary artery disease    Cough    De Quervain's syndrome (tenosynovitis) 07/03/2018   Gastroesophageal reflux disease without esophagitis    Heart murmur    Hypertension    Irritable bowel syndrome    Lichen simplex chronicus 09/2004   PONV (postoperative nausea and vomiting)    Unilateral primary osteoarthritis, 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   Past Surgical History:  Procedure Laterality Date   ABDOMINAL HYSTERECTOMY  1988   secondary to prolapse   BLADDER SUSPENSION     A-P with Hyst   CATARACT EXTRACTION Bilateral    INSERTION OF MESH  10/16/2018   Procedure: INSERTION OF MESH;  Surgeon: Ardis Hughs, MD;  Location: [facility];  Service: Urology;;   [place] Left 06/08/2022   Procedure: REVERSE SHOULDER ARTHROPLASTY;  Surgeon: Justice Britain, MD;  Location: [facility];  Service: Orthopedics;  Laterality: Left;   ROBOTIC ASSISTED LAPAROSCOPIC SACROCOLPOPEXY N/A 10/16/2018   Procedure: XI ROBOTIC ASSISTED LAPAROSCOPIC SACROCOLPOPEXY;  Surgeon: Ardis Hughs, MD;  Location: [facility];  Service: Urology;  Laterality: N/A;   TONSILLECTOMY     TOTAL HIP ARTHROPLASTY Right 11/04/2021   Procedure: RIGHT TOTAL HIP ARTHROPLASTY ANTERIOR APPROACH;  Surgeon: Mcarthur Rossetti, MD;  Location: [facility];  Service:  Orthopedics;  Laterality: Right;   Patient Active Problem List   Diagnosis Date Noted   Sore throat 09/08/2022   S/P reverse total shoulder arthroplasty, left 07/18/2022   Diverticulosis 11/16/2021   Dyslipidemia 11/16/2021   History of Clostridium difficile infection 11/16/2021   Status post total replacement of right hip 11/04/2021   DDD (degenerative disc disease), lumbar 09/28/2019   Aortic atherosclerosis ([place]) 09/28/2019   OSA (obstructive sleep apnea) 01/27/2019   Pulmonary nodule 01/27/2019   Allergic rhinitis 11/06/2018   Upper airway cough syndrome 09/24/2018   Essential hypertension 06/03/2018   Anxiety and depression 01/17/2017   Vitamin D deficiency 01/17/2017   Osteopenia 04/08/2015   Irritable bowel syndrome (IBS) 02/03/2008    PCP:  Haydee Salter, MD  REFERRING PROVIDER:  Haydee Salter, MD  REFERRING DIAG: W10.8XXD (ICD-10-CM) - Fall (on) (from) other stairs and steps, subsequent encounter  THERAPY DIAG:  Fall (on) (from) other stairs and steps, subsequent encounter  Rationale for Evaluation and Treatment Rehabilitation  ONSET DATE: 09/11/2022  SUBJECTIVE:   SUBJECTIVE STATEMENT: Pt presents to PT s/p fall on 09/11/2022 while walking down her back stairs. This is the first and only fall she has had, has some residual discomfort in recent L reverse TSA afterwards. Reports longstanding L hip pain, denies that is makes her unsteady or increase in pain post fall.  PERTINENT HISTORY: R THA, L Reverse TSA, Hx of cancer  PAIN:  Are you having pain?  Yes: NPRS scale: 2/10 Pain location: L hip Pain description: sharp Aggravating factors: car transfer, hip flexion Relieving factors: rest  PRECAUTIONS: None  WEIGHT BEARING RESTRICTIONS No  FALLS:  Has patient fallen in last 6 months? Yes. Number of falls one - fell down her back stairs while carrying groceries in both hands  LIVING ENVIRONMENT: Lives with: lives with their family Lives in:  House/apartment Stairs: Yes: External: 2 steps; building rails soon Has following equipment at home: None  OCCUPATION: Retired  PLOF: Independent and Independent with basic ADLs  PATIENT GOALS: improve balance and steadiness   OBJECTIVE:   DIAGNOSTIC FINDINGS:   See imaging  PATIENT SURVEYS:  FOTO: 54% function; 61% predicted   COGNITION:  Overall cognitive status: Within functional limits for tasks assessed     SENSATION: WFL  POSTURE: rounded shoulders and forward head  PALPATION: TTP to L anterior thigh   LOWER EXTREMITY MMT:  MMT Right eval Left eval  Hip flexion 5/5 3+/5 p!  Hip extension    Hip abduction 5/5 4/5  Hip adduction 5/5 4/5  Hip internal rotation    Hip external rotation    Knee flexion 5/5 5/5  Knee extension 5/5 5/5  Ankle dorsiflexion    Ankle plantarflexion    Ankle inversion    Ankle eversion     (Blank rows = not tested)   FUNCTIONAL TESTS:  Five Time Sit to Stand: 11 seconds Tandem stance: 30 seconds each SLS: 10 seconds each  GAIT: Distance walked: 78f Assistive device utilized: None Level of assistance: Complete Independence Comments: slight hip drop on L, decreased step width   TODAY'S TREATMENT: [facility] Adult PT Treatment:                                                DATE: 09/28/2022 Therapeutic Exercise: SLR x 10 each Supine clamshell x 10 BTB Bridge x 10 Tandem stance x 30"  PATIENT EDUCATION:  Education details: eval findings, FOTO, HEP, POC Person educated: Patient Education method: Explanation, Demonstration, and Handouts Education comprehension: verbalized understanding and returned demonstration   HOME EXERCISE PROGRAM: Access Code: [email or web address] URL: [email or web address] Date: 09/28/2022 Prepared by: DOctavio Manns Exercises - Active Straight Leg Raise with Quad Set  - 1 x daily - 7 x weekly - 2-3 sets - 10 reps - Hooklying Clamshell with Resistance  - 1 x daily - 7 x weekly - 2-3 sets -  10-15 reps - Supine Bridge  - 1 x daily - 7 x weekly - 3 sets - 10 reps - Standing Tandem Balance with Counter Support  - 1 x daily - 7 x weekly - 2-3 sets - 30 seconds hold  ASSESSMENT:  CLINICAL IMPRESSION: Patient is a 81y.o. F who was seen today for physical therapy evaluation and treatment s/p fall on 09/11/2022. Physical findings show pt is at a limited fall risk, with only SLS time below age-related norms. She does have some L proximal hip weakness, could also be secondary to pain. After discussion and HEP review, PT will not pick patient up on caseload as she is at or near her PLOF and should improve with HEP. She is also making home modifications with addition of handrails on back stairs which should also improve  safety.     OBJECTIVE IMPAIRMENTS decreased balance, decreased endurance, decreased strength, and pain  ACTIVITY LIMITATIONS squatting  PARTICIPATION LIMITATIONS: community activity, occupation, and yard work  PERSONAL FACTORS None   REHAB POTENTIAL: Excellent  CLINICAL DECISION MAKING: Stable/uncomplicated  EVALUATION COMPLEXITY: Low   GOALS: Goals reviewed with patient? Yes  SHORT TERM GOALS: Target date: 10/12/2022  Pt will be compliant and knowledgeable with initial HEP for improved comfort and carryover Baseline: initial HEP given  Goal status: MET   PLAN: PT FREQUENCY: one time visit  PT DURATION: N/A  PLANNED INTERVENTIONS: Therapeutic exercises, Therapeutic activity, Neuromuscular re-education, Balance training, Gait training, Patient/Family education, Self Care, Joint mobilization, Electrical stimulation, Cryotherapy, Moist heat, Manual therapy, and Re-evaluation  PLAN FOR NEXT SESSION: N/A   Ward Chatters, PT 09/28/2022, 3:09 PM

## 2022-09-28 ENCOUNTER — Ambulatory Visit: Payer: Medicare Other | Attending: Family Medicine

## 2022-09-28 ENCOUNTER — Other Ambulatory Visit: Payer: Self-pay

## 2022-09-28 DIAGNOSIS — S7001XD Contusion of right hip, subsequent encounter: Secondary | ICD-10-CM | POA: Diagnosis not present

## 2022-09-28 DIAGNOSIS — W108XXD Fall (on) (from) other stairs and steps, subsequent encounter: Secondary | ICD-10-CM | POA: Diagnosis not present

## 2022-09-28 DIAGNOSIS — S0083XD Contusion of other part of head, subsequent encounter: Secondary | ICD-10-CM | POA: Insufficient documentation

## 2022-10-02 DIAGNOSIS — H2000 Unspecified acute and subacute iridocyclitis: Secondary | ICD-10-CM | POA: Diagnosis not present

## 2022-10-10 ENCOUNTER — Ambulatory Visit (INDEPENDENT_AMBULATORY_CARE_PROVIDER_SITE_OTHER): Payer: Medicare Other

## 2022-10-10 VITALS — Ht 60.0 in | Wt 149.0 lb

## 2022-10-10 DIAGNOSIS — Z0000 Encounter for general adult medical examination without abnormal findings: Secondary | ICD-10-CM | POA: Diagnosis not present

## 2022-10-10 NOTE — Patient Instructions (Signed)
Gloria Werner , Thank you for taking time to come for your Medicare Wellness Visit. I appreciate your ongoing commitment to your health goals. Please review the following plan we discussed and let me know if I can assist you in the future.   These are the goals we discussed:  Goals      Patient Stated     Continue current level of activity     Patient Stated     Continue current lifestyle     Track and Manage My Blood Pressure-Hypertension     Timeframe:  Long-Range Goal Priority:  High Start Date: 10/17/2021                            Expected End Date: 10/17/2022                    Follow Up within 90 days   - check blood pressure weekly    Why is this important?   You won't feel high blood pressure, but it can still hurt your blood vessels.  High blood pressure can cause heart or kidney problems. It can also cause a stroke.  Making lifestyle changes like losing a little weight or eating less salt will help.  Checking your blood pressure at home and at different times of the day can help to control blood pressure.  If the doctor prescribes medicine remember to take it the way the doctor ordered.  Call the office if you cannot afford the medicine or if there are questions about it.     Notes:      Weight (lb) < 140 lb (63.5 kg)        This is a list of the screening recommended for you and due dates:  Health Maintenance  Topic Date Due   Flu Shot  07/25/2022   Mammogram  02/10/2023   Tetanus Vaccine  09/12/2032   Pneumonia Vaccine  Completed   DEXA scan (bone density measurement)  Completed   Hepatitis C Screening: USPSTF Recommendation to screen - Ages 79-79 yo.  Completed   Zoster (Shingles) Vaccine  Completed   HPV Vaccine  Aged Out   COVID-19 Vaccine  Discontinued   Cologuard (Stool DNA test)  Discontinued    Advanced directives: In Chart   Conditions/risks identified: Aim for 30 minutes of exercise or brisk walking, 6-8 glasses of water, and 5 servings of  fruits and vegetables each day.   Next appointment: Follow up in one year for your annual wellness visit    Preventive Care 65 Years and Older, Female Preventive care refers to lifestyle choices and visits with your health care provider that can promote health and wellness. What does preventive care include? A yearly physical exam. This is also called an annual well check. Dental exams once or twice a year. Routine eye exams. Ask your health care provider how often you should have your eyes checked. Personal lifestyle choices, including: Daily care of your teeth and gums. Regular physical activity. Eating a healthy diet. Avoiding tobacco and drug use. Limiting alcohol use. Practicing safe sex. Taking low-dose aspirin every day. Taking vitamin and mineral supplements as recommended by your health care provider. What happens during an annual well check? The services and screenings done by your health care provider during your annual well check will depend on your age, overall health, lifestyle risk factors, and family history of disease. Counseling  Your health care provider may  ask you questions about your: Alcohol use. Tobacco use. Drug use. Emotional well-being. Home and relationship well-being. Sexual activity. Eating habits. History of falls. Memory and ability to understand (cognition). Work and work Statistician. Reproductive health. Screening  You may have the following tests or measurements: Height, weight, and BMI. Blood pressure. Lipid and cholesterol levels. These may be checked every 5 years, or more frequently if you are over 81 years old. Skin check. Lung cancer screening. You may have this screening every year starting at age 41 if you have a 30-pack-year history of smoking and currently smoke or have quit within the past 15 years. Fecal occult blood test (FOBT) of the stool. You may have this test every year starting at age 45. Flexible sigmoidoscopy or  colonoscopy. You may have a sigmoidoscopy every 5 years or a colonoscopy every 10 years starting at age 59. Hepatitis C blood test. Hepatitis B blood test. Sexually transmitted disease (STD) testing. Diabetes screening. This is done by checking your blood sugar (glucose) after you have not eaten for a while (fasting). You may have this done every 1-3 years. Bone density scan. This is done to screen for osteoporosis. You may have this done starting at age 25. Mammogram. This may be done every 1-2 years. Talk to your health care provider about how often you should have regular mammograms. Talk with your health care provider about your test results, treatment options, and if necessary, the need for more tests. Vaccines  Your health care provider may recommend certain vaccines, such as: Influenza vaccine. This is recommended every year. Tetanus, diphtheria, and acellular pertussis (Tdap, Td) vaccine. You may need a Td booster every 10 years. Zoster vaccine. You may need this after age 23. Pneumococcal 13-valent conjugate (PCV13) vaccine. One dose is recommended after age 70. Pneumococcal polysaccharide (PPSV23) vaccine. One dose is recommended after age 31. Talk to your health care provider about which screenings and vaccines you need and how often you need them. This information is not intended to replace advice given to you by your health care provider. Make sure you discuss any questions you have with your health care provider. Document Released: 01/07/2016 Document Revised: 08/30/2016 Document Reviewed: 10/12/2015 Elsevier Interactive Patient Education  2017 [employer] Prevention in the Home Falls can cause injuries. They can happen to people of all ages. There are many things you can do to make your home safe and to help prevent falls. What can I do on the outside of my home? Regularly fix the edges of walkways and driveways and fix any cracks. Remove anything that might make you  trip as you walk through a door, such as a raised step or threshold. Trim any bushes or trees on the path to your home. Use bright outdoor lighting. Clear any walking paths of anything that might make someone trip, such as rocks or tools. Regularly check to see if handrails are loose or broken. Make sure that both sides of any steps have handrails. Any raised decks and porches should have guardrails on the edges. Have any leaves, snow, or ice cleared regularly. Use sand or salt on walking paths during winter. Clean up any spills in your garage right away. This includes oil or grease spills. What can I do in the bathroom? Use night lights. Install grab bars by the toilet and in the tub and shower. Do not use towel bars as grab bars. Use non-skid mats or decals in the tub or shower. If you need  to sit down in the shower, use a plastic, non-slip stool. Keep the floor dry. Clean up any water that spills on the floor as soon as it happens. Remove soap buildup in the tub or shower regularly. Attach bath mats securely with double-sided non-slip rug tape. Do not have throw rugs and other things on the floor that can make you trip. What can I do in the bedroom? Use night lights. Make sure that you have a light by your bed that is easy to reach. Do not use any sheets or blankets that are too big for your bed. They should not hang down onto the floor. Have a firm chair that has side arms. You can use this for support while you get dressed. Do not have throw rugs and other things on the floor that can make you trip. What can I do in the kitchen? Clean up any spills right away. Avoid walking on wet floors. Keep items that you use a lot in easy-to-reach places. If you need to reach something above you, use a strong step stool that has a grab bar. Keep electrical cords out of the way. Do not use floor polish or wax that makes floors slippery. If you must use wax, use non-skid floor wax. Do not have  throw rugs and other things on the floor that can make you trip. What can I do with my stairs? Do not leave any items on the stairs. Make sure that there are handrails on both sides of the stairs and use them. Fix handrails that are broken or loose. Make sure that handrails are as long as the stairways. Check any carpeting to make sure that it is firmly attached to the stairs. Fix any carpet that is loose or worn. Avoid having throw rugs at the top or bottom of the stairs. If you do have throw rugs, attach them to the floor with carpet tape. Make sure that you have a light switch at the top of the stairs and the bottom of the stairs. If you do not have them, ask someone to add them for you. What else can I do to help prevent falls? Wear shoes that: Do not have high heels. Have rubber bottoms. Are comfortable and fit you well. Are closed at the toe. Do not wear sandals. If you use a stepladder: Make sure that it is fully opened. Do not climb a closed stepladder. Make sure that both sides of the stepladder are locked into place. Ask someone to hold it for you, if possible. Clearly mark and make sure that you can see: Any grab bars or handrails. First and last steps. Where the edge of each step is. Use tools that help you move around (mobility aids) if they are needed. These include: Canes. Walkers. Scooters. Crutches. Turn on the lights when you go into a dark area. Replace any light bulbs as soon as they burn out. Set up your furniture so you have a clear path. Avoid moving your furniture around. If any of your floors are uneven, fix them. If there are any pets around you, be aware of where they are. Review your medicines with your doctor. Some medicines can make you feel dizzy. This can increase your chance of falling. Ask your doctor what other things that you can do to help prevent falls. This information is not intended to replace advice given to you by your health care provider.  Make sure you discuss any questions you have with your health  care provider. Document Released: 10/07/2009 Document Revised: 05/18/2016 Document Reviewed: 01/15/2015 Elsevier Interactive Patient Education  2017 [employer].

## 2022-10-10 NOTE — Progress Notes (Addendum)
Subjective:   Gloria Werner is a 81 y.o. female who presents for Medicare Annual (Subsequent) preventive examination.   I connected with  Gearlean Alf on 10/10/22 by a audio enabled telemedicine application and verified that I am speaking with the correct person using two identifiers.  Patient Location: Home  Provider Location: Home Office  I discussed the limitations of evaluation and management by telemedicine. The patient expressed understanding and agreed to proceed.  Review of Systems     Cardiac Risk Factors include: advanced age (>37mn, >>59women);hypertension     Objective:    Today's Vitals   10/10/22 1318  Weight: 149 lb (67.6 kg)  Height: 5' (1.524 m)   Body mass index is 29.1 kg/m.     10/10/2022    1:22 PM 09/28/2022    2:10 PM 06/01/2022    1:55 PM 11/04/2021    1:00 PM 10/31/2021    8:33 AM 09/20/2021    8:44 AM 09/14/2020    8:21 AM  Advanced Directives  Does Patient Have a Medical Advance Directive? Yes Yes Yes Yes Yes Yes Yes  Type of [place]aramed[place]of [place] will  [place]Living will [place]Living will [place]Living will [place]Living will [place]Living will  Does patient want to make changes to medical advance directive? No - Patient declined   No - Patient declined     Copy of HMurphyin Chart? Yes - validated most recent copy scanned in chart (See row information)  Yes - validated most recent copy scanned in chart (See row information)   No - copy requested No - copy requested    Current Medications (verified) Outpatient Encounter Medications as of 10/10/2022  Medication Sig   acetaminophen (TYLENOL) 500 MG tablet Take 1-2 tablets (500-1,000 mg total) by mouth at bedtime as needed for moderate pain.   albuterol (VENTOLIN HFA) 108 (90 Base) MCG/ACT inhaler Inhale 1-2 puffs into  the lungs every 6 (six) hours as needed.   amLODipine (NORVASC) 5 MG tablet Take 1 tablet (5 mg total) by mouth daily.   Biotin 10000 MCG TABS Take 10,000 mcg by mouth daily.   buPROPion (WELLBUTRIN) 75 MG tablet Take 1 tablet (75 mg total) by mouth daily.   calcium carbonate (OSCAL) 1500 (600 Ca) MG TABS tablet Take 600 mg of elemental calcium by mouth daily with breakfast.   Carboxymethylcellul-Glycerin (LUBRICATING EYE DROPS OP) Place 1 drop into both eyes daily as needed (dry eyes). Visine   cyclobenzaprine (FLEXERIL) 10 MG tablet Take 1 tablet (10 mg total) by mouth 3 (three) times daily as needed for muscle spasms.   Docusate Calcium (STOOL SOFTENER PO) Take 1 tablet by mouth daily.   magic mouthwash (nystatin, lidocaine, diphenhydrAMINE) suspension Take 5 mLs by mouth 3 (three) times daily as needed (sore throat).   meloxicam (MOBIC) 15 MG tablet Take 1 tablet (15 mg total) by mouth daily as needed for pain.   polyethylene glycol (MIRALAX / GLYCOLAX) 17 g packet Take 17 g by mouth daily. Takes an extra scoop in the evening as needed for moderate-severe constipation.   pyridOXINE (VITAMIN B-6) 100 MG tablet Take 100 mg by mouth daily.   saccharomyces boulardii (FLORASTOR) 250 MG capsule Take 2 capsules (500 mg total) by mouth 2 (two) times daily. (Patient taking differently: Take 250 mg by mouth daily.)   triamcinolone (NASACORT ALLERGY 24HR) 55 MCG/ACT AERO nasal  inhaler Place 1 spray into the nose daily.   vitamin C (ASCORBIC ACID) 500 MG tablet Take 500 mg by mouth daily.   Vitamin D, Ergocalciferol, (DRISDOL) 1.25 MG (50000 UNIT) CAPS capsule Take 1 capsule (50,000 Units total) by mouth every 30 (thirty) days.   No facility-administered encounter medications on file as of 10/10/2022.    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   Past Surgical History:  Procedure Laterality Date   ABDOMINAL HYSTERECTOMY  1988   secondary to prolapse   BLADDER SUSPENSION     A-P with Hyst   CATARACT EXTRACTION Bilateral    INSERTION OF MESH  10/16/2018   Procedure: INSERTION OF MESH;  Surgeon: Ardis Hughs, MD;  Location: [facility];  Service: Urology;;   Kerr Left 06/08/2022   Procedure: REVERSE SHOULDER ARTHROPLASTY;  Surgeon: Justice Britain, MD;  Location: [facility];  Service: Orthopedics;  Laterality: Left;   ROBOTIC ASSISTED LAPAROSCOPIC SACROCOLPOPEXY N/A 10/16/2018   Procedure: XI ROBOTIC ASSISTED LAPAROSCOPIC SACROCOLPOPEXY;  Surgeon: Ardis Hughs, MD;  Location: [facility];  Service: Urology;  Laterality: N/A;   TONSILLECTOMY     TOTAL HIP ARTHROPLASTY Right 11/04/2021   Procedure: RIGHT TOTAL HIP ARTHROPLASTY ANTERIOR APPROACH;  Surgeon: Mcarthur Rossetti, MD;  Location: [facility];  Service: Orthopedics;  Laterality: Right;   Family History  Problem Relation Age of Onset   Emphysema Mother    Lymphoma Mother    Asthma Mother    Cancer Father 21       lung cancer   Coronary artery disease Brother    Cancer Brother        breast cancer   Breast cancer Brother    Cancer Brother        lung cancer   Diabetes Neg Hx    Hypertension Neg Hx    Colon cancer Neg Hx    Esophageal cancer Neg Hx    Rectal cancer Neg Hx    Stomach cancer Neg Hx    Social History   Socioeconomic History   Marital status: Married    Spouse name: Ilona Sorrel   Number of children: 2   Years of education:  12   Highest education level: Not on file  Occupational History   Occupation: Environmental health practitioner   Occupation: Retired  Tobacco Use   Smoking status: Never   Smokeless tobacco: Never  Scientific laboratory technician Use: Never used  Substance and Sexual Activity   Alcohol use: No   Drug use: No   Sexual activity: Not Currently    Partners: Male    Birth control/protection: Surgical    Comment: hysterectomy  Other Topics Concern   Not on file  Social History Narrative   HSG. Married '63. 1 son- 77'; 1-daughter-'68; grandchildren 3.  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 Provided lead to [email or web address].   Social Determinants of Health   Financial Resource Strain: Low Risk  (10/10/2022)   Overall Financial Resource Strain (CARDIA)    Difficulty of Paying Living Expenses: Not hard at all  Food Insecurity: No Food Insecurity (10/10/2022)   Hunger Vital Sign    Worried About Running Out of Food in the Last Year: Never true    Ran Out of Food in the Last Year: Never true  Transportation Needs: No Transportation Needs (10/10/2022)   PRAPARE - Hydrologist (Medical): No    Lack of Transportation (Non-Medical): No  Physical Activity: Sufficiently Active (10/10/2022)   Exercise Vital Sign    Days of Exercise per Week: 3 days    Minutes of Exercise per Session: 50 min  Stress: No Stress Concern Present (10/10/2022)   [place]ita    Feeling of Stress : Not at all  Social Connections: Moderately Integrated (10/10/2022)   Social Connection and Isolation Panel [NHANES]    Frequency of Communication with Friends and Family: More than three times a week    Frequency of Social Gatherings with Friends and Family: More than three times a week    Attends Religious Services: More than 4 times per year    Active Member of [employer] or Organizations: No    Attends Programme researcher, broadcasting/film/video: Never    Marital Status: Married    Tobacco Counseling Counseling given: Not Answered   Clinical Intake:  Pre-visit preparation completed: Yes  Pain : No/denies pain     Nutritional Risks: None Diabetes: No  How often do you need to have someone help you when you read instructions, pamphlets, or other written materials from your doctor or pharmacy?: 1 - Never  Diabetic?no   Interpreter Needed?: No  Information entered by :: Jadene Pierini, LPN   Activities of Daily Living    10/10/2022    1:22 PM 06/01/2022    1:57 PM  In your present state of health, do you have any difficulty performing the following activities:  Hearing? 0   Vision? 0   Difficulty concentrating or making decisions? 0   Walking or climbing stairs? 0   Dressing or bathing? 0   Doing errands, shopping? 0 0  Preparing Food and eating ? N   Using the Toilet? N   In the past six months, have you accidently leaked urine? N   Do you have problems with loss of bowel control? N   Managing your Medications? N   Managing your Finances? N   Housekeeping or managing your Housekeeping? N     Patient Care Team: Haydee Salter, MD as PCP - General (Family Medicine) Josue Hector, MD as PCP - Cardiology (Cardiology) Irene Shipper, MD (Gastroenterology) Thalia Bloodgood, [place] as Referring Physician (Optometry) Kem Boroughs, [place] as Nurse Practitioner (Nurse Practitioner) Germaine Pomfret, [facility] as Pharmacist (Pharmacist) Tanda Rockers, MD as Consulting Physician (Pulmonary Disease) Mcarthur Rossetti, MD as Consulting Physician (Orthopedic Surgery)  Indicate any recent Medical Services you may have received from other than [facility] providers in the past year (date may be approximate).     Assessment:   This is a routine wellness examination for [address].  Hearing/Vision screen Vision Screening - Comments:: Annual eye exams wears glasses   Dietary issues and exercise  activities discussed: Current Exercise Habits: Home exercise routine, Type of exercise: walking, Time (  Minutes): 45, Frequency (Times/Week): 3, Weekly Exercise (Minutes/Week): 135, Intensity: Mild, Exercise limited by: orthopedic condition(s)   Goals Addressed             This Visit's Progress    Patient Stated   On track    Continue current level of activity       Depression Screen    10/10/2022    1:21 PM 02/17/2022    8:51 AM 09/20/2021    8:43 AM 09/14/2020    8:23 AM 08/03/2020   11:10 AM 09/29/2019    8:07 AM 09/10/2019   10:11 AM  PHQ 2/9 Scores  PHQ - 2 Score 0 0 0 0 0  0  Exception Documentation      Other- indicate reason in comment box   Not completed      Just completed     Fall Risk    10/10/2022    1:19 PM 02/17/2022    8:51 AM 09/20/2021    8:36 AM 09/14/2020    8:22 AM 08/03/2020   11:10 AM  Fall Risk   Falls in the past year? 1 0 0 1 1  Number falls in past yr: 1 0 0 0 0  Injury with Fall? 1 0 0 0 0  Comment     tripped over cord at home.  Risk for fall due to : History of fall(s);Impaired balance/gait;Orthopedic patient No Fall Risks     Follow up Education provided;Falls prevention discussed Falls evaluation completed Falls evaluation completed;Falls prevention discussed Falls prevention discussed     FALL RISK PREVENTION PERTAINING TO THE HOME:  Any stairs in or around the home? No  If so, are there any without handrails? No  Home free of loose throw rugs in walkways, pet beds, electrical cords, etc? Yes  Adequate lighting in your home to reduce risk of falls? Yes   ASSISTIVE DEVICES UTILIZED TO PREVENT FALLS:  Life alert? No  Use of a cane, walker or w/c? No  Grab bars in the bathroom? Yes  Shower chair or bench in shower? No  Elevated toilet seat or a handicapped toilet? No       10/10/2022    1:23 PM  6CIT Screen  What Year? 0 points  What month? 0 points  What time? 0 points  Count back from 20 0 points  Months in reverse 0 points   Repeat phrase 0 points  Total Score 0 points    Immunizations Immunization History  Administered Date(s) Administered   Fluad Quad(high Dose 65+) 09/16/2019   H1N1 01/07/2009   Influenza Whole 09/23/2012   Influenza, High Dose Seasonal PF 10/11/2017   Influenza,inj,Quad PF,6+ Mos 09/28/2015, 08/02/2016, 09/04/2018   Influenza-Unspecified 08/25/2014, 09/06/2020, 10/14/2021   PFIZER(Purple Top)SARS-COV-2 Vaccination 01/09/2020, 02/11/2020   Pneumococcal Conjugate-13 02/18/2014   Pneumococcal Polysaccharide-23 10/22/2012   Td 10/22/2012   Tdap 09/12/2022   Zoster Recombinat (Shingrix) 12/10/2018, 04/01/2019   Zoster, Live 11/28/2012    TDAP status: Up to date  Flu Vaccine status: Up to date  Pneumococcal vaccine status: Up to date  Covid-19 vaccine status: Completed vaccines  Qualifies for Shingles Vaccine? Yes   Zostavax completed No   Shingrix Completed?: No.    Education has been provided regarding the importance of this vaccine. Patient has been advised to call insurance company to determine out of pocket expense if they have not yet received this vaccine. Advised may also receive vaccine at local pharmacy or Health Dept. Verbalized acceptance and understanding.  Screening  Tests Health Maintenance  Topic Date Due   INFLUENZA VACCINE  07/25/2022   MAMMOGRAM  02/10/2023   TETANUS/TDAP  09/12/2032   Pneumonia Vaccine 52+ Years old  Completed   DEXA SCAN  Completed   Hepatitis C Screening  Completed   Zoster Vaccines- Shingrix  Completed   HPV VACCINES  Aged Out   COVID-19 Vaccine  Discontinued   Fecal DNA (Cologuard)  Discontinued    Health Maintenance  Health Maintenance Due  Topic Date Due   INFLUENZA VACCINE  07/25/2022    Colorectal cancer screening: No longer required.   Mammogram status: No longer required due to age.  Bone Density status: Completed 12/21/2020. Results reflect: Bone density results: OSTEOPENIA. Repeat every 5 years.  Lung Cancer  Screening: (Low Dose CT Chest recommended if Age 38-80 years, 30 pack-year currently smoking OR have quit w/in 15years.) does not qualify.   Lung Cancer Screening Referral: n/a  Additional Screening:  Hepatitis C Screening: does not qualify;   Vision Screening: Recommended annual ophthalmology exams for early detection of glaucoma and other disorders of the eye. Is the patient up to date with their annual eye exam?  Yes  Who is the provider or what is the name of the office in which the patient attends annual eye exams? Dr.Groat  If pt is not established with a provider, would they like to be referred to a provider to establish care? No .   Dental Screening: Recommended annual dental exams for proper oral hygiene  Community Resource Referral / Chronic Care Management: CRR required this visit?  No   CCM required this visit?  No      Plan:     I have personally reviewed and noted the following in the patient's chart:   Medical and social history Use of alcohol, tobacco or illicit drugs  Current medications and supplements including opioid prescriptions. Patient is not currently taking opioid prescriptions. Functional ability and status Nutritional status Physical activity Advanced directives List of other physicians Hospitalizations, surgeries, and ER visits in previous 12 months Vitals Screenings to include cognitive, depression, and falls Referrals and appointments  In addition, I have reviewed and discussed with patient certain preventive protocols, quality metrics, and best practice recommendations. A written personalized care plan for preventive services as well as general preventive health recommendations were provided to patient.     Daphane Shepherd, LPN   [id number]   Nurse Notes: none

## 2022-10-12 ENCOUNTER — Ambulatory Visit (INDEPENDENT_AMBULATORY_CARE_PROVIDER_SITE_OTHER): Payer: Medicare Other

## 2022-10-12 DIAGNOSIS — E785 Hyperlipidemia, unspecified: Secondary | ICD-10-CM

## 2022-10-12 DIAGNOSIS — F419 Anxiety disorder, unspecified: Secondary | ICD-10-CM

## 2022-10-12 DIAGNOSIS — I10 Essential (primary) hypertension: Secondary | ICD-10-CM

## 2022-10-12 NOTE — Progress Notes (Signed)
Chronic Care Management Pharmacy Note  10/12/2022 Name:  JOELEE Werner MRN:  [id number] DOB:  09/12/1941  Summary: Patient presents for CCM follow-up. Blood pressure mildly elevated. Cholesterol is at goal, but rosuvastatin was stopped. Patient continues to note muscle aches and thinks they are unrelated to her previous statin use.   Recommendations/Changes made from today's visit: -Continue current medications  Plan: CPP follow-up 6 months   Subjective: Gloria Werner is an 81 y.o. year old female who is a primary patient of Arlester Marker MD.  The CCM team was consulted for assistance with disease management and care coordination needs.    Engaged with patient by telephone for follow up visit in response to provider referral for pharmacy case management and/or care coordination services.   Consent to Services:  The patient was given information about Chronic Care Management services, agreed to services, and gave verbal consent prior to initiation of services.  Please see initial visit note for detailed documentation.   Patient Care Team: Haydee Salter, MD as PCP - General (Family Medicine) Josue Hector, MD as PCP - Cardiology (Cardiology) Irene Shipper, MD (Gastroenterology) Thalia Bloodgood, [place] as Referring Physician (Optometry) Kem Boroughs, [place] as Nurse Practitioner (Nurse Practitioner) Germaine Pomfret, [facility] as Pharmacist (Pharmacist) Tanda Rockers, MD as Consulting Physician (Pulmonary Disease) Mcarthur Rossetti, MD as Consulting Physician (Orthopedic Surgery)  Recent office visits: 09/20/22: Patient presented to Dr. Gena Fray for fall.  09/08/22: Patient presented to Dr. Grandville Silos for fever.  08/17/22:  Patient presented to Dr. Gena Fray for follow-up. Rosuvastatin stopped.   Recent consult visits: None in past 6 months   Hospital visits: 09/11/22: Patient presented to ED for fall  06/08/22: Patient presented for reverse shoulder surgery.     Objective:  Lab Results  Component Value Date   CREATININE 0.85 06/01/2022   BUN 15 06/01/2022   GFR 80.20 02/17/2022   GFRNONAA >60 06/01/2022   GFRAA >60 10/17/2018   NA 140 06/01/2022   K 4.2 06/01/2022   CALCIUM 9.6 06/01/2022   CO2 28 06/01/2022   GLUCOSE 130 (H) 06/01/2022    Lab Results  Component Value Date/Time   HGBA1C 5.6 02/17/2022 09:23 AM   GFR 80.20 02/17/2022 09:23 AM   GFR 64.04 08/03/2020 11:35 AM    Last diabetic Eye exam: No results found for: "HMDIABEYEEXA"  Last diabetic Foot exam: No results found for: "HMDIABFOOTEX"   Lab Results  Component Value Date   CHOL 133 02/17/2022   HDL 48.10 02/17/2022   LDLCALC 69 02/17/2022   LDLDIRECT 84.0 08/03/2020   TRIG 79.0 02/17/2022   CHOLHDL 3 02/17/2022       Latest Ref Rng & Units 02/17/2022    9:23 AM 08/03/2020   11:35 AM 12/29/2019    8:05 AM  Hepatic Function  Total Protein 6.0 - 8.3 g/dL 6.7  7.0  7.3   Albumin 3.5 - 5.2 g/dL 4.4  4.4  4.5   AST 0 - 37 U/L 19  21  27    ALT 0 - 35 U/L 13  16  21    Alk Phosphatase 39 - 117 U/L 54  54  53   Total Bilirubin 0.2 - 1.2 mg/dL 0.4  0.5  0.4     Lab Results  Component Value Date/Time   TSH 1.64 09/29/2019 08:39 AM   TSH 2.02 09/04/2018 07:53 AM       Latest Ref Rng & Units 06/01/2022    2:11 PM 02/17/2022  9:23 AM 11/05/2021   10:54 AM  CBC  WBC 4.0 - 10.5 K/uL 7.0  4.4  10.9   Hemoglobin 12.0 - 15.0 g/dL 13.1  13.0  10.8   Hematocrit 36.0 - 46.0 % 40.9  39.8  32.6   Platelets 150 - 400 K/uL 201  185.0  146     Lab Results  Component Value Date/Time   VD25OH 35.18 02/17/2022 09:23 AM   VD25OH 33.36 08/03/2020 11:35 AM    Clinical ASCVD: No  The ASCVD Risk score (Arnett DK, et al., 2019) failed to calculate for the following reasons:   The systolic blood pressure is missing       10/10/2022    1:21 PM 02/17/2022    8:51 AM 09/20/2021    8:43 AM  Depression screen PHQ 2/9  Decreased Interest 0 0 0  Down, Depressed, Hopeless 0 0  0  PHQ - 2 Score 0 0 0    Social History   Tobacco Use  Smoking Status Never  Smokeless Tobacco Never   BP Readings from Last 3 Encounters:  09/20/22 (!) 144/90  09/12/22 (!) 147/74  09/08/22 126/86   Pulse Readings from Last 3 Encounters:  09/20/22 85  09/12/22 73  09/08/22 78   Wt Readings from Last 3 Encounters:  10/10/22 149 lb (67.6 kg)  09/20/22 153 lb 3.2 oz (69.5 kg)  09/11/22 152 lb 12.5 oz (69.3 kg)   BMI Readings from Last 3 Encounters:  10/10/22 29.10 kg/m  09/20/22 30.94 kg/m  09/11/22 30.86 kg/m   -Last DEXA Scan: 12/21/20   T-Score femoral neck: -2.3  T-Score total hip: -1.5  T-Score lumbar spine: NA  T-Score forearm radius: -2.0  10-year probability of major osteoporotic fracture: 16.2%  10-year probability of hip fracture: 5.0%  Assessment/Interventions: Review of patient past medical history, allergies, medications, health status, including review of consultants reports, laboratory and other test data, was performed as part of comprehensive evaluation and provision of chronic care management services.   SDOH:  (Social Determinants of Health) assessments and interventions performed: Yes SDOH Interventions    Flowsheet Row Clinical Support from 10/10/2022 in [place] Management from 10/13/2021 in [place] from 09/20/2021 in [place] Interventions Intervention Not Indicated -- Intervention Not Indicated  Housing Interventions Intervention Not Indicated -- Intervention Not Indicated  Transportation Interventions Intervention Not Indicated -- Intervention Not Indicated  Financial Strain Interventions Intervention Not Indicated Intervention Not Indicated Intervention Not Indicated  Physical Activity Interventions Intervention Not Indicated -- Intervention Not Indicated  Stress Interventions Intervention Not  Indicated -- Intervention Not Indicated  Social Connections Interventions Intervention Not Indicated -- Intervention Not Indicated       SDOH Screenings   Food Insecurity: No Food Insecurity (10/10/2022)  Housing: Low Risk  (10/10/2022)  Transportation Needs: No Transportation Needs (10/10/2022)  Utilities: Not At Risk (10/10/2022)  Alcohol Screen: Low Risk  (10/10/2022)  Depression (PHQ2-9): Low Risk  (10/10/2022)  Financial Resource Strain: Low Risk  (10/10/2022)  Physical Activity: Sufficiently Active (10/10/2022)  Social Connections: Moderately Integrated (10/10/2022)  Stress: No Stress Concern Present (10/10/2022)  Tobacco Use: Low Risk  (10/10/2022)    CCM Care Plan  Allergies  Allergen Reactions   Lexapro [Escitalopram] Diarrhea and Other (See Comments)    headache   Ace Inhibitors Cough   Codeine Nausea And Vomiting    Medications Reviewed Today  Reviewed by Daphane Shepherd, LPN (Licensed Practical Nurse) on 10/10/22 at 29  Med List Status: <None>   Medication Order Taking? Sig Documenting Provider Last Dose Status Informant  acetaminophen (TYLENOL) 500 MG tablet [id number] Yes Take 1-2 tablets (500-1,000 mg total) by mouth at bedtime as needed for moderate pain. Bonnita Hollow, MD Taking Active   albuterol (VENTOLIN HFA) 108 [phone] Base) MCG/ACT inhaler [id number] Yes Inhale 1-2 puffs into the lungs every 6 (six) hours as needed. Parrett, Fonnie Mu, NP Taking Active Self  amLODipine (NORVASC) 5 MG tablet [id number] Yes Take 1 tablet (5 mg total) by mouth daily. Haydee Salter, MD Taking Active   Biotin 10000 MCG TABS [id number] Yes Take 10,000 mcg by mouth daily. [provider] Taking Active Self  buPROPion (WELLBUTRIN) 75 MG tablet [id number] Yes Take 1 tablet (75 mg total) by mouth daily. Haydee Salter, MD Taking Active   calcium carbonate (OSCAL) 1500 (600 Ca) MG TABS tablet [id number] Yes Take 600 mg of elemental calcium by mouth daily with breakfast.  [provider] Taking Active Self  Carboxymethylcellul-Glycerin (LUBRICATING EYE DROPS OP) [id number] Yes Place 1 drop into both eyes daily as needed (dry eyes). Visine [provider] Taking Active Self  cyclobenzaprine (FLEXERIL) 10 MG tablet [id number] Yes Take 1 tablet (10 mg total) by mouth 3 (three) times daily as needed for muscle spasms. Shuford, [place], PA-C Taking Active   Docusate Calcium (STOOL SOFTENER PO) [id number] Yes Take 1 tablet by mouth daily. [provider] Taking Active Self  magic mouthwash (nystatin, lidocaine, diphenhydrAMINE) suspension [id number] Yes Take 5 mLs by mouth 3 (three) times daily as needed (sore throat). Bonnita Hollow, MD Taking Active   meloxicam [facility]) 15 MG tablet [id number] Yes Take 1 tablet (15 mg total) by mouth daily as needed for pain. Mcarthur Rossetti, MD Taking Active   polyethylene glycol [facility] / GLYCOLAX) 17 g packet [id number] Yes Take 17 g by mouth daily. Takes an extra scoop in the evening as needed for moderate-severe constipation. [provider] Taking Active Self  pyridOXINE (VITAMIN B-6) 100 MG tablet [id number] Yes Take 100 mg by mouth daily. [provider] Taking Active Self  saccharomyces boulardii (FLORASTOR) 250 MG capsule [id number] Yes Take 2 capsules (500 mg total) by mouth 2 (two) times daily.  Patient taking differently: Take 250 mg by mouth daily.   Esterwood, Amy S, PA-C Taking Active Self  triamcinolone (NASACORT ALLERGY 24HR) 55 MCG/ACT AERO nasal inhaler [id number] Yes Place 1 spray into the nose daily. [provider] Taking Active Self  vitamin C (ASCORBIC ACID) 500 MG tablet [id number] Yes Take 500 mg by mouth daily. [provider] Taking Active Self  Vitamin D, Ergocalciferol, (DRISDOL) 1.25 MG (50000 UNIT) CAPS capsule [id number] Yes Take 1 capsule (50,000 Units total) by mouth every 30 (thirty) days. 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   DDD (degenerative disc disease), lumbar 09/28/2019   Aortic atherosclerosis ([place]) 09/28/2019   OSA (obstructive sleep apnea) 01/27/2019   Pulmonary nodule 01/27/2019   Allergic rhinitis 11/06/2018   Upper airway cough syndrome 09/24/2018   Essential hypertension 06/03/2018  Anxiety and depression 01/17/2017   Vitamin D deficiency 01/17/2017   Osteopenia 04/08/2015   Irritable bowel syndrome (IBS) 02/03/2008    Immunization History  Administered Date(s) Administered   Fluad Quad(high Dose 65+) 09/16/2019   H1N1 01/07/2009   Influenza Whole 09/23/2012   Influenza, High Dose Seasonal PF 10/11/2017   Influenza,inj,Quad PF,6+ Mos 09/28/2015, 08/02/2016, 09/04/2018   Influenza-Unspecified 08/25/2014, 09/06/2020, 10/14/2021   PFIZER(Purple Top)SARS-COV-2 Vaccination 01/09/2020, 02/11/2020   Pneumococcal Conjugate-13 02/18/2014   Pneumococcal Polysaccharide-23 10/22/2012   Td 10/22/2012   Tdap 09/12/2022   Zoster Recombinat (Shingrix) 12/10/2018, 04/01/2019   Zoster, Live 11/28/2012    Conditions to be addressed/monitored:  Hypertension, Hyperlipidemia, Coronary Artery Disease, Anxiety, Osteopenia, Osteoarthritis, and Irritable Bowel Syndrome  Care Plan : General Pharmacy (Adult)  Updates made by Germaine Pomfret, RPH since 10/12/2022 12:00 AM     Problem: Hypertension, Hyperlipidemia, Coronary Artery Disease, Anxiety, Osteopenia, Osteoarthritis, and Irritable Bowel Syndrome   Priority: High     Long-Range Goal: Patient-Specific Goal   Start Date: 10/17/2021  Expected End Date: 10/13/2023  This Visit's Progress: On track  Recent Progress: On track  Priority: High   Note:   Current Barriers:  Unable to achieve control of cholesterol   Pharmacist Clinical Goal(s):  Patient will achieve control of cholesterol as evidenced by LDL less than 70 through collaboration with PharmD and provider.   Interventions: 1:1 collaboration with Arlester Marker MD regarding development and update of comprehensive plan of care as evidenced by provider attestation and co-signature Inter-disciplinary care team collaboration (see longitudinal plan of care) Comprehensive medication review performed; medication list updated in electronic medical record  Hypertension (BP goal <140/90) -Controlled -Current treatment: Amlodipine 5 mg daily  -Medications previously tried: Lisinopril (Cough)   -Current home readings: 140s/85, highest in recall was 157/90 -Current dietary habits: 3-4 cups of water daily, 1 diet coke daily. 2 cups of coffee in the mornings. Trying to eat less.  -Current exercise habits: Walking 3 times weekly 45 minutes  -Reports/hypertensive symptoms -Recommended to continue current medication  Hyperlipidemia: (LDL goal < 70) -Controlled -History of aortic atherosclerosis  -Current treatment: None  -Medications previously tried: Rosuvastatin -Has not noticed a significant change.  -Does report muscle aches. Overall bearable, but unable to tolerate more than 3x weekly dosing of rosuvastatin  -Recommended to continue current medication  Depression / Anxiety (Goal: Achieve symptom remission) -Not ideally controlled -Current treatment: Bupropion 75 mg daily  -Medications previously tried/failed: NA -Feels more stress, irritable recently.  -PHQ9: 0 -GAD7: 5 -Bupropion is questionably appropriate for use with anxiety, patient reports that it helps "take the edge off" so she feels less anxious and sleep better.  -Recommended to continue current medication  Osteopenia (Goal Prevent fractures) -Uncontrolled -Patient is a candidate for pharmacologic  treatment due to T-Score -1.0 to -2.5 and 10-year risk of hip fracture > 3% -Current treatment  Calcium Carbonate 600 mg daily  Ergocalciferol 50,000 units monthly -Medications previously tried: NA   IBS (Goal: Improve symptoms) -Controlled -Current treatment  Florastor 250 mg 2 caps twice daily  -Medications previously tried: Linzess (cost)  -Recommended to continue current medication  Osteoarthritis (Goal: Improve symptoms ) -Controlled -Current treatment  Acetaminophen 500 mg 1-2 tablets nightly.  Cyclobenzaprine 10 mg three times daily as needed Meloxicam 15 mg daily  -Medications previously tried: NA  -Recommended to continue current medication  Patient Goals/Self-Care Activities Patient will:  - check blood pressure weekly, document, and provide at future appointments  Follow Up Plan:  Telephone follow up appointment with care management team member scheduled for:  04/26/2023 at 8:30 AM     Medication Assistance: None required.  Patient affirms current coverage meets needs.  Compliance/Adherence/Medication fill history: Care Gaps: Covid-19 Vaccine (Dose 3- Booster for [employer]) Influenza Vaccine  Star-Rating Drugs: Rosuvastatin 5 mg last filled 01/13/2021 90 day supply at [facility].  Patient's preferred pharmacy is:  [facility], [address] [place] [address] Phone: [phone] Fax: [facility] [place] [address] Phone: [phone] Fax: [phone]   Uses pill box? Yes Pt endorses 100% compliance  We discussed: Current pharmacy is preferred with insurance plan and patient is satisfied with pharmacy services Patient decided to: Continue current medication management strategy  Care Plan and Follow Up Patient Decision:  Patient agrees to Care Plan and Follow-up.  Plan: Telephone follow up appointment with care  management team member scheduled for:  04/26/2023 at 8:30 AM  Junius Argyle, PharmD, Para [place], CPP Clinical Pharmacist [facility] at [facility]  [phone]

## 2022-10-12 NOTE — Patient Instructions (Signed)
Visit Information It was great speaking with you today!  Please let me know if you have any questions about our visit.   Goals Addressed             This Visit's Progress    Track and Manage My Blood Pressure-Hypertension   On track    Timeframe:  Long-Range Goal Priority:  High Start Date: 10/17/2021                            Expected End Date: 10/17/2022                    Follow Up within 90 days   - check blood pressure weekly    Why is this important?   You won't feel high blood pressure, but it can still hurt your blood vessels.  High blood pressure can cause heart or kidney problems. It can also cause a stroke.  Making lifestyle changes like losing a little weight or eating less salt will help.  Checking your blood pressure at home and at different times of the day can help to control blood pressure.  If the doctor prescribes medicine remember to take it the way the doctor ordered.  Call the office if you cannot afford the medicine or if there are questions about it.     Notes:         Patient Care Plan: General Pharmacy (Adult)     Problem Identified: Hypertension, Hyperlipidemia, Coronary Artery Disease, Anxiety, Osteopenia, Osteoarthritis, and Irritable Bowel Syndrome   Priority: High     Long-Range Goal: Patient-Specific Goal   Start Date: 10/17/2021  Expected End Date: 10/13/2023  This Visit's Progress: On track  Recent Progress: On track  Priority: High  Note:   Current Barriers:  Unable to achieve control of cholesterol   Pharmacist Clinical Goal(s):  Patient will achieve control of cholesterol as evidenced by LDL less than 70 through collaboration with PharmD and provider.   Interventions: 1:1 collaboration with Arlester Marker MD regarding development and update of comprehensive plan of care as evidenced by provider attestation and co-signature Inter-disciplinary care team collaboration (see longitudinal plan of care) Comprehensive medication  review performed; medication list updated in electronic medical record  Hypertension (BP goal <140/90) -Controlled -Current treatment: Amlodipine 5 mg daily  -Medications previously tried: Lisinopril (Cough)   -Current home readings: 140s/85, highest in recall was 157/90 -Current dietary habits: 3-4 cups of water daily, 1 diet coke daily. 2 cups of coffee in the mornings. Trying to eat less.  -Current exercise habits: Walking 3 times weekly 45 minutes  -Reports/hypertensive symptoms -Recommended to continue current medication  Hyperlipidemia: (LDL goal < 70) -Controlled -History of aortic atherosclerosis  -Current treatment: None  -Medications previously tried: Rosuvastatin -Has not noticed a significant change.  -Does report muscle aches. Overall bearable, but unable to tolerate more than 3x weekly dosing of rosuvastatin  -Recommended to continue current medication  Depression / Anxiety (Goal: Achieve symptom remission) -Not ideally controlled -Current treatment: Bupropion 75 mg daily  -Medications previously tried/failed: NA -Feels more stress, irritable recently.  -PHQ9: 0 -GAD7: 5 -Bupropion is questionably appropriate for use with anxiety, patient reports that it helps "take the edge off" so she feels less anxious and sleep better.  -Recommended to continue current medication  Osteopenia (Goal Prevent fractures) -Uncontrolled -Patient is a candidate for pharmacologic treatment due to T-Score -1.0 to -2.5 and 10-year risk of hip fracture >  3% -Current treatment  Calcium Carbonate 600 mg daily  Ergocalciferol 50,000 units monthly -Medications previously tried: NA   IBS (Goal: Improve symptoms) -Controlled -Current treatment  Florastor 250 mg 2 caps twice daily  -Medications previously tried: Linzess (cost)  -Recommended to continue current medication  Osteoarthritis (Goal: Improve symptoms ) -Controlled -Current treatment  Acetaminophen 500 mg 1-2 tablets  nightly.  Cyclobenzaprine 10 mg three times daily as needed Meloxicam 15 mg daily  -Medications previously tried: NA  -Recommended to continue current medication  Patient Goals/Self-Care Activities Patient will:  - check blood pressure weekly, document, and provide at future appointments  Follow Up Plan: Telephone follow up appointment with care management team member scheduled for:  04/26/2023 at 8:30 AM    Patient agreed to services and verbal consent obtained.   Patient verbalizes understanding of instructions and care plan provided today and agrees to view in [place]. Active MyChart status and patient understanding of how to access instructions and care plan via MyChart confirmed with patient.     Junius Argyle, PharmD, Para [place], CPP Clinical Pharmacist Practitioner  [facility] at [facility]  [phone]

## 2022-10-17 DIAGNOSIS — Z961 Presence of intraocular lens: Secondary | ICD-10-CM | POA: Diagnosis not present

## 2022-10-17 DIAGNOSIS — H2000 Unspecified acute and subacute iridocyclitis: Secondary | ICD-10-CM | POA: Diagnosis not present

## 2022-10-24 DIAGNOSIS — I10 Essential (primary) hypertension: Secondary | ICD-10-CM

## 2022-10-24 DIAGNOSIS — F32A Depression, unspecified: Secondary | ICD-10-CM | POA: Diagnosis not present

## 2022-10-24 DIAGNOSIS — E785 Hyperlipidemia, unspecified: Secondary | ICD-10-CM | POA: Diagnosis not present

## 2022-10-24 DIAGNOSIS — M1990 Unspecified osteoarthritis, unspecified site: Secondary | ICD-10-CM

## 2022-11-01 DIAGNOSIS — M9904 Segmental and somatic dysfunction of sacral region: Secondary | ICD-10-CM | POA: Diagnosis not present

## 2022-11-01 DIAGNOSIS — M9903 Segmental and somatic dysfunction of lumbar region: Secondary | ICD-10-CM | POA: Diagnosis not present

## 2022-11-01 DIAGNOSIS — M5442 Lumbago with sciatica, left side: Secondary | ICD-10-CM | POA: Diagnosis not present

## 2022-11-01 DIAGNOSIS — M9901 Segmental and somatic dysfunction of cervical region: Secondary | ICD-10-CM | POA: Diagnosis not present

## 2022-11-08 DIAGNOSIS — M9904 Segmental and somatic dysfunction of sacral region: Secondary | ICD-10-CM | POA: Diagnosis not present

## 2022-11-08 DIAGNOSIS — M9903 Segmental and somatic dysfunction of lumbar region: Secondary | ICD-10-CM | POA: Diagnosis not present

## 2022-11-08 DIAGNOSIS — M9901 Segmental and somatic dysfunction of cervical region: Secondary | ICD-10-CM | POA: Diagnosis not present

## 2022-11-08 DIAGNOSIS — M5442 Lumbago with sciatica, left side: Secondary | ICD-10-CM | POA: Diagnosis not present

## 2022-11-14 IMAGING — MG MM DIGITAL SCREENING BILAT W/ TOMO AND CAD
6 of 10 series · 6 of 30 positions shown · non-contrast
Comparison: Previous exam(s).

CLINICAL DATA: Screening.

EXAM:
DIGITAL SCREENING BILATERAL MAMMOGRAM WITH TOMOSYNTHESIS AND CAD
TECHNIQUE: Bilateral screening digital craniocaudal and mediolateral oblique
mammograms were obtained. Bilateral screening digital breast
tomosynthesis was performed. The images were evaluated with
computer-aided detection.

[L CC synth-2D (1 of 2)]
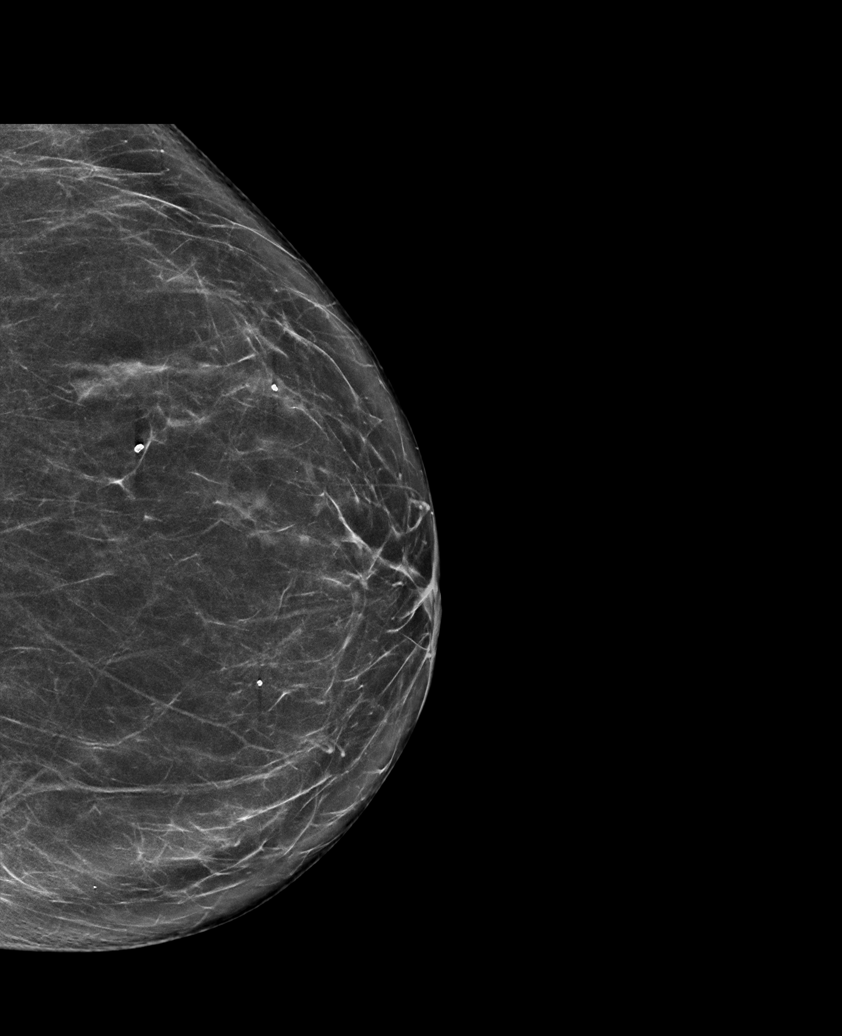

[R MLO synth-2D]
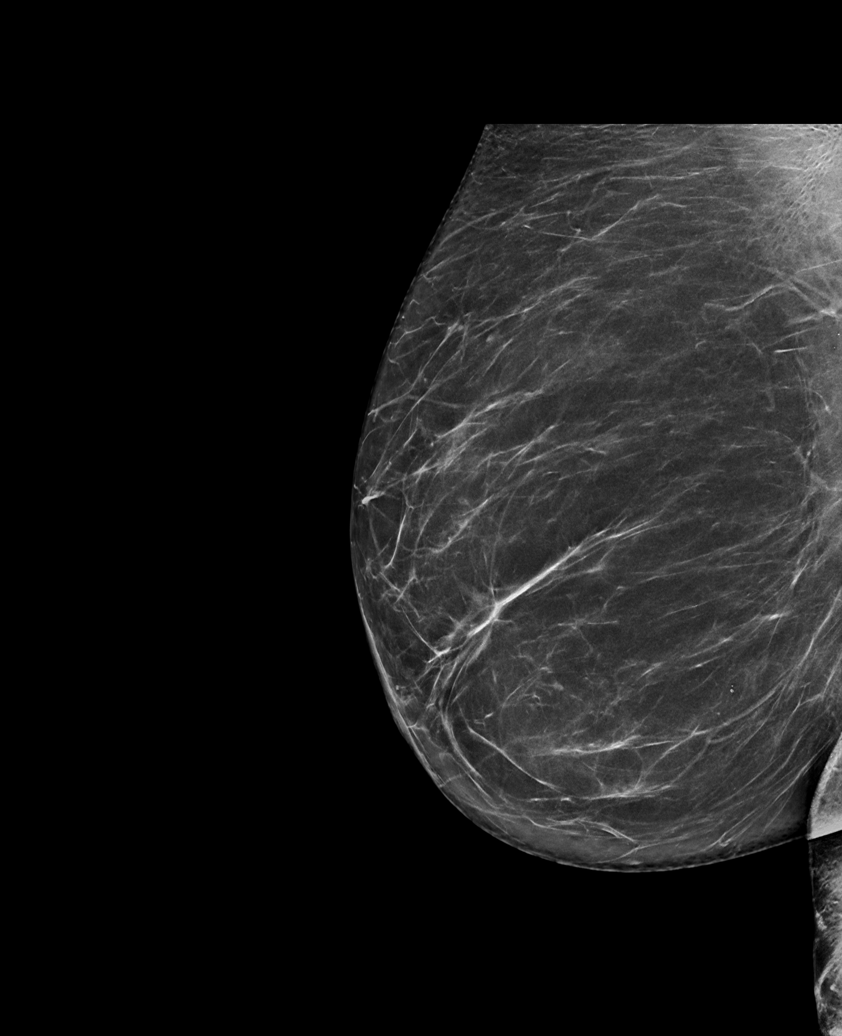

[L CC synth-2D (2 of 2)]
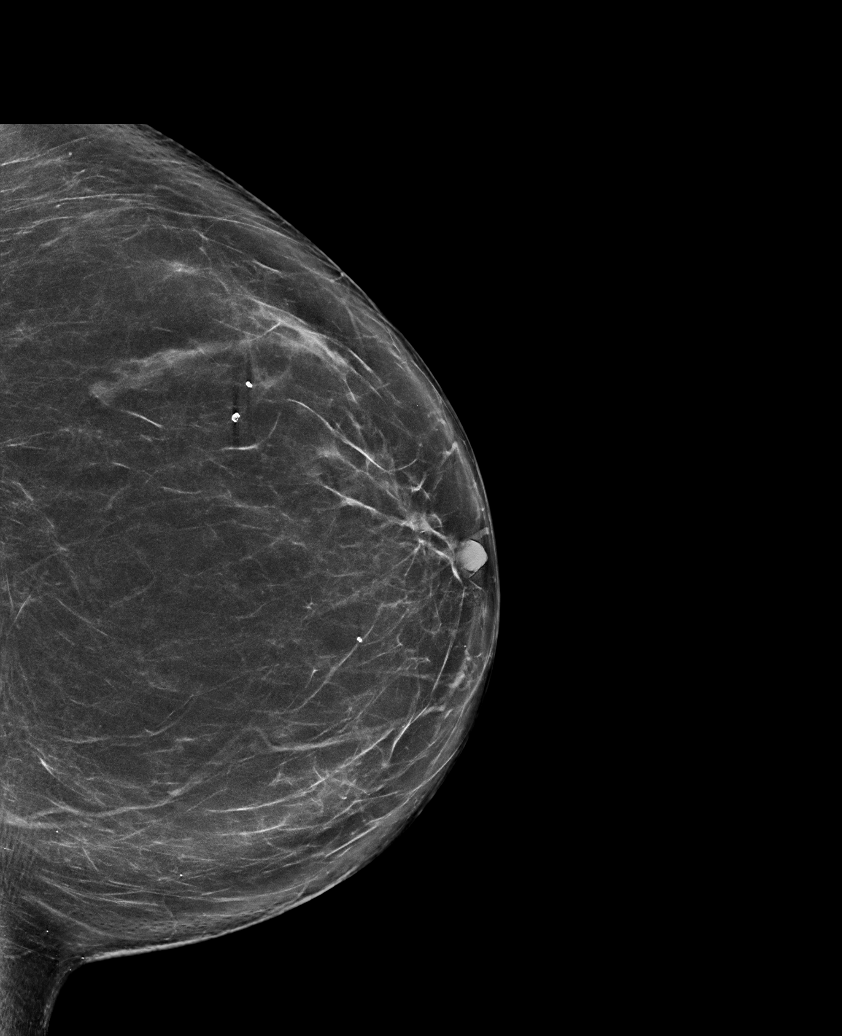

[R CC synth-2D]
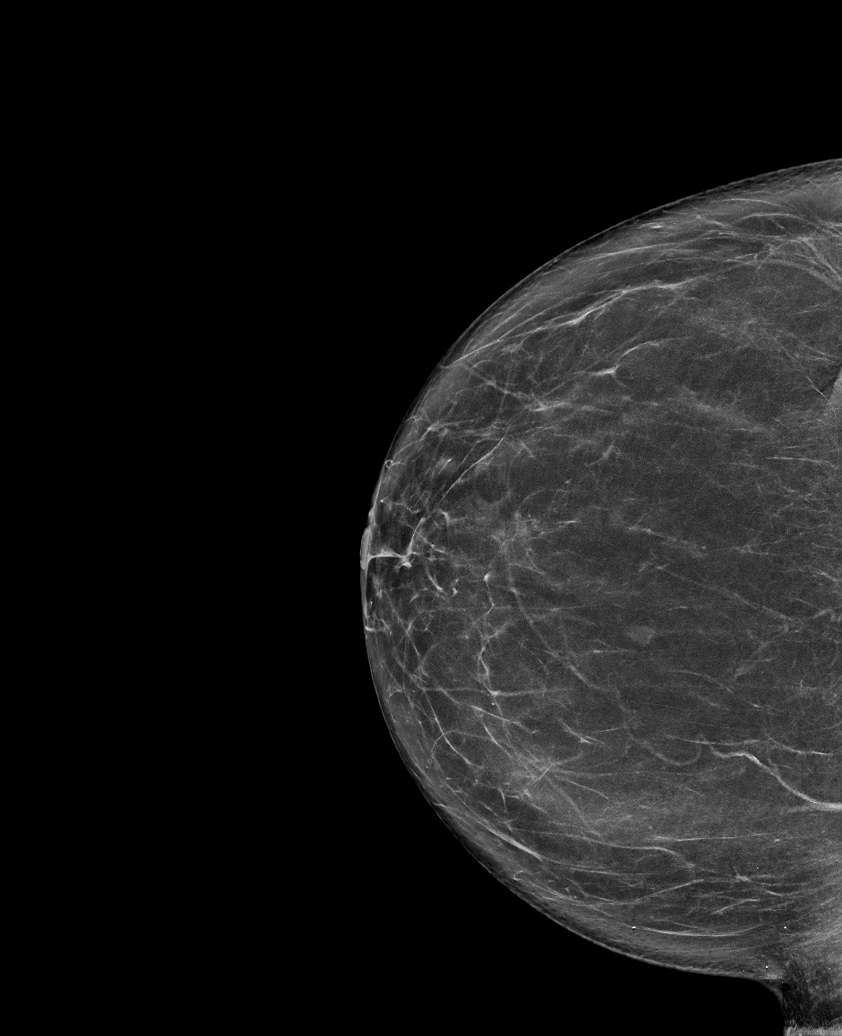

[L MLO synth-2D]
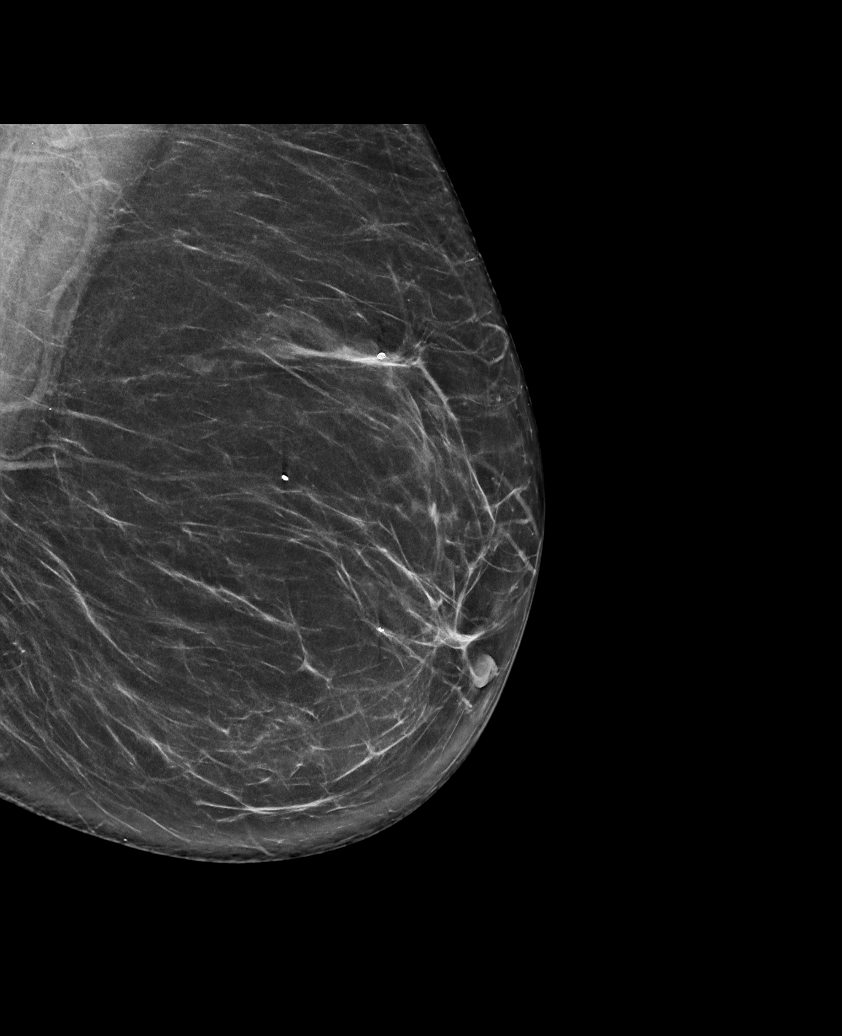

[L CC tomo · tomo slice 38/75.0]
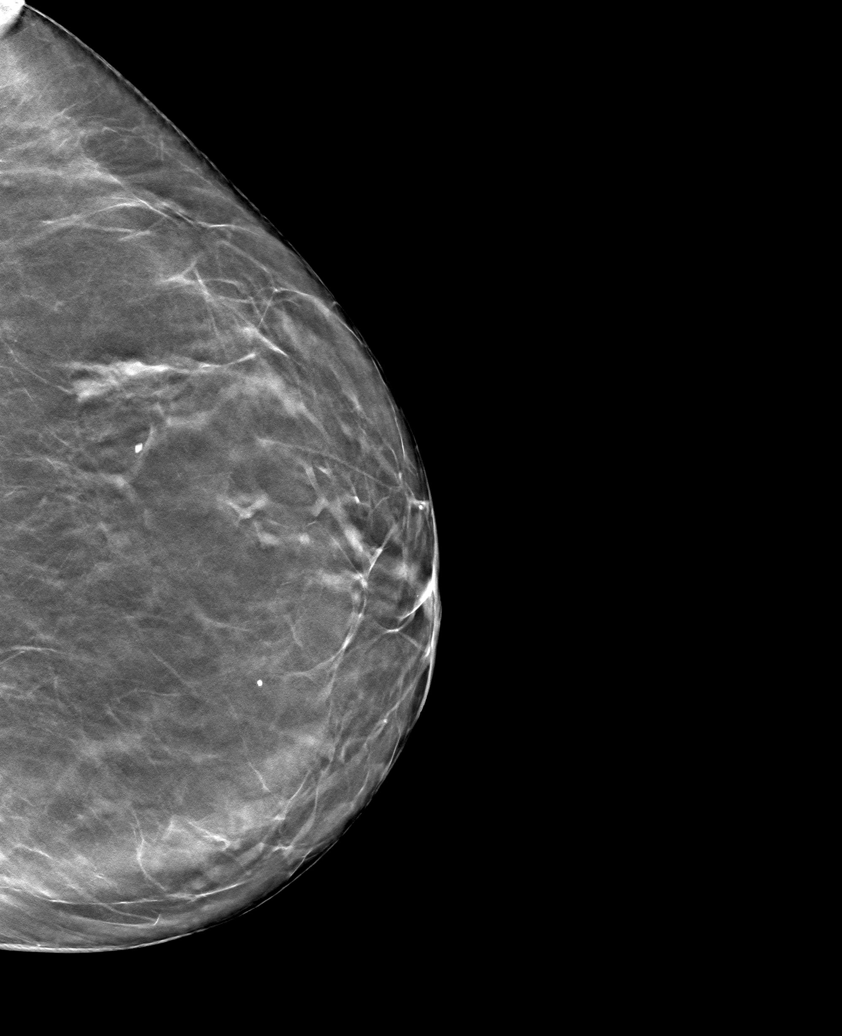

[6 of 30 positions shown; findings below may reference images not displayed]

ACR Breast Density Category b: There are scattered areas of
fibroglandular density.
FINDINGS: There are no findings suspicious for malignancy.
IMPRESSION: No mammographic evidence of malignancy. A result letter of this
screening mammogram will be mailed directly to the patient.

RECOMMENDATION:
Screening mammogram in one year. (Code:51-O-LD2)

BI-RADS CATEGORY  1: Negative.

## 2022-11-21 DIAGNOSIS — D2239 Melanocytic nevi of other parts of face: Secondary | ICD-10-CM | POA: Diagnosis not present

## 2022-11-21 DIAGNOSIS — C44311 Basal cell carcinoma of skin of nose: Secondary | ICD-10-CM | POA: Diagnosis not present

## 2022-11-21 DIAGNOSIS — L720 Epidermal cyst: Secondary | ICD-10-CM | POA: Diagnosis not present

## 2022-11-22 DIAGNOSIS — M5442 Lumbago with sciatica, left side: Secondary | ICD-10-CM | POA: Diagnosis not present

## 2022-11-22 DIAGNOSIS — M9903 Segmental and somatic dysfunction of lumbar region: Secondary | ICD-10-CM | POA: Diagnosis not present

## 2022-11-22 DIAGNOSIS — M9904 Segmental and somatic dysfunction of sacral region: Secondary | ICD-10-CM | POA: Diagnosis not present

## 2022-11-22 DIAGNOSIS — M9901 Segmental and somatic dysfunction of cervical region: Secondary | ICD-10-CM | POA: Diagnosis not present

## 2022-11-29 DIAGNOSIS — M9903 Segmental and somatic dysfunction of lumbar region: Secondary | ICD-10-CM | POA: Diagnosis not present

## 2022-11-29 DIAGNOSIS — M9901 Segmental and somatic dysfunction of cervical region: Secondary | ICD-10-CM | POA: Diagnosis not present

## 2022-11-29 DIAGNOSIS — M5442 Lumbago with sciatica, left side: Secondary | ICD-10-CM | POA: Diagnosis not present

## 2022-11-29 DIAGNOSIS — M9904 Segmental and somatic dysfunction of sacral region: Secondary | ICD-10-CM | POA: Diagnosis not present

## 2022-12-20 ENCOUNTER — Encounter: Payer: Self-pay | Admitting: Family Medicine

## 2022-12-20 ENCOUNTER — Ambulatory Visit (INDEPENDENT_AMBULATORY_CARE_PROVIDER_SITE_OTHER): Payer: Medicare Other | Admitting: Family Medicine

## 2022-12-20 VITALS — BP 134/86 | HR 94 | Temp 97.3°F | Ht 60.0 in | Wt 151.8 lb

## 2022-12-20 DIAGNOSIS — L723 Sebaceous cyst: Secondary | ICD-10-CM | POA: Diagnosis not present

## 2022-12-20 DIAGNOSIS — J209 Acute bronchitis, unspecified: Secondary | ICD-10-CM | POA: Diagnosis not present

## 2022-12-20 LAB — POCT INFLUENZA A/B
Influenza A, POC: NEGATIVE
Influenza B, POC: NEGATIVE

## 2022-12-20 LAB — POC COVID19 BINAXNOW: SARS Coronavirus 2 Ag: NEGATIVE

## 2022-12-20 NOTE — Progress Notes (Signed)
[facility] [address] [place] [address] Dept: [phone] Dept Fax: [phone]  Office Visit  Subjective:    Patient ID: Gloria Werner, female    DOB: September 16, 1941, 81 y.o..   MRN: [id number]  Chief Complaint  Patient presents with   Acute Visit    C/o having nasal/chest congestion, cough, wheezing x 1 week.   She has taken Mucinex.     History of Present Illness:  Patient is in today for with a 1 week history of nasal congestion, cough, and wheezing.  She notes her husband had been sick for several days prior to this. She notes the cough is worse at night and first thing int he morning. She has used her albuterol inhaler a small amount. She is on Mucinex Cold & Flu.  [name] had recently been on a course of antibiotics prescribed by dermatology for an infected area on her back. She feels this is doing better. the dermatologist did not discuss any long term plan for this.  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   DDD (degenerative disc disease), lumbar 09/28/2019   Aortic atherosclerosis ([place]) 09/28/2019   OSA (obstructive sleep apnea) 01/27/2019   Pulmonary nodule 01/27/2019   Allergic rhinitis 11/06/2018   Upper airway cough syndrome 09/24/2018   Essential hypertension 06/03/2018   Anxiety and depression 01/17/2017   Vitamin D deficiency 01/17/2017   Osteopenia 04/08/2015   Irritable bowel syndrome (IBS) 02/03/2008   Past Surgical History:  Procedure Laterality Date   ABDOMINAL HYSTERECTOMY  1988   secondary to prolapse   BLADDER SUSPENSION     A-P with Hyst   CATARACT EXTRACTION Bilateral    INSERTION OF MESH   10/16/2018   Procedure: INSERTION OF MESH;  Surgeon: Ardis Hughs, MD;  Location: [facility];  Service: Urology;;   [place] ARTHROPLASTY Left 06/08/2022   Procedure: REVERSE SHOULDER ARTHROPLASTY;  Surgeon: Justice Britain, MD;  Location: [facility];  Service: Orthopedics;  Laterality: Left;   ROBOTIC ASSISTED LAPAROSCOPIC SACROCOLPOPEXY N/A 10/16/2018   Procedure: XI ROBOTIC ASSISTED LAPAROSCOPIC SACROCOLPOPEXY;  Surgeon: Ardis Hughs, MD;  Location: [facility];  Service: Urology;  Laterality: N/A;   TONSILLECTOMY     TOTAL HIP ARTHROPLASTY Right 11/04/2021   Procedure: RIGHT TOTAL HIP ARTHROPLASTY ANTERIOR APPROACH;  Surgeon: Mcarthur Rossetti, MD;  Location: [facility];  Service: Orthopedics;  Laterality: Right;   Family History  Problem Relation Age of Onset   Emphysema Mother    Lymphoma Mother    Asthma Mother    Cancer Father 60       lung cancer   Coronary artery disease Brother    Cancer Brother        breast cancer   Breast cancer Brother    Cancer Brother        lung cancer   Diabetes Neg Hx    Hypertension Neg Hx    Colon cancer Neg Hx    Esophageal cancer Neg Hx    Rectal cancer Neg Hx    Stomach cancer Neg Hx    Outpatient Medications Prior to Visit  Medication Sig Dispense Refill   acetaminophen (TYLENOL)  500 MG tablet Take 1-2 tablets (500-1,000 mg total) by mouth at bedtime as needed for moderate pain. 30 tablet 0   albuterol (VENTOLIN HFA) 108 (90 Base) MCG/ACT inhaler Inhale 1-2 puffs into the lungs every 6 (six) hours as needed. 8 g 2   amLODipine (NORVASC) 5 MG tablet Take 1 tablet (5 mg total) by mouth daily. 90 tablet 1   Biotin 10000 MCG TABS Take 10,000 mcg by mouth daily.     buPROPion (WELLBUTRIN) 75 MG tablet Take 1 tablet (75 mg total) by mouth daily. 90 tablet 3   calcium carbonate (OSCAL) 1500 (600 Ca) MG TABS tablet Take 600 mg of elemental calcium by mouth daily with breakfast.      Carboxymethylcellul-Glycerin (LUBRICATING EYE DROPS OP) Place 1 drop into both eyes daily as needed (dry eyes). Visine     cyclobenzaprine (FLEXERIL) 10 MG tablet Take 1 tablet (10 mg total) by mouth 3 (three) times daily as needed for muscle spasms. 30 tablet 1   Docusate Calcium (STOOL SOFTENER PO) Take 1 tablet by mouth daily.     polyethylene glycol (MIRALAX / GLYCOLAX) 17 g packet Take 17 g by mouth daily. Takes an extra scoop in the evening as needed for moderate-severe constipation.     pyridOXINE (VITAMIN B-6) 100 MG tablet Take 100 mg by mouth daily.     saccharomyces boulardii (FLORASTOR) 250 MG capsule Take 2 capsules (500 mg total) by mouth 2 (two) times daily. (Patient taking differently: Take 250 mg by mouth daily.) 120 capsule 3   triamcinolone (NASACORT ALLERGY 24HR) 55 MCG/ACT AERO nasal inhaler Place 1 spray into the nose daily.     vitamin C (ASCORBIC ACID) 500 MG tablet Take 500 mg by mouth daily.     Vitamin D, Ergocalciferol, (DRISDOL) 1.25 MG (50000 UNIT) CAPS capsule Take 1 capsule (50,000 Units total) by mouth every 30 (thirty) days. 3 capsule 3   meloxicam (MOBIC) 15 MG tablet Take 1 tablet (15 mg total) by mouth daily as needed for pain. (Patient not taking: Reported on 12/20/2022) 30 tablet 6   magic mouthwash (nystatin, lidocaine, diphenhydrAMINE) suspension Take 5 mLs by mouth 3 (three) times daily as needed (sore throat). 180 mL 0   No facility-administered medications prior to visit.   Allergies  Allergen Reactions   Lexapro [Escitalopram] Diarrhea and Other (See Comments)    headache   Ace Inhibitors Cough   Codeine Nausea And Vomiting     Objective:   Today's Vitals   12/20/22 1012  BP: 134/86  Pulse: 94  Temp: (!) 97.3 F (36.3 C)  TempSrc: Temporal  SpO2: 94%  Weight: 151 lb 12.8 oz (68.9 kg)  Height: 5' (1.524 m)   Body mass index is 29.65 kg/m.   General: Well developed, well nourished. No acute distress. HEENT: Normocephalic, non-traumatic.  Conjunctiva clear. External ears normal. EAC and TMs normal   bilaterally. Nose with mild congestion. Mucous membranes moist. Oropharynx clear. Good dentition. Neck: Supple. No lymphadenopathy. No thyromegaly. Lungs: Clear to auscultation bilaterally. No wheezing, rales or rhonchi. Skin: There is a 2 cm subcutaneous nodule. No sign of redness or fluctuance. There is a black pit   overlying this. Psych: Alert and oriented. Normal mood and affect.  There are no preventive care reminders to display for this patient.  Lab Results POCT Covid: Neg. POCT Influenza A& B: Neg.    Assessment & Plan:   1. Acute bronchitis, unspecified organism Discussed home care for viral illness, including rest,  pushing fluids, and OTC medications as needed for symptom relief. Recommend hot tea with honey for sore throat symptoms. She should use her albuterol as needed. Follow-up if needed for worsening or persistent symptoms.  - POC COVID-19 - POCT Influenza A/B  2. Sebaceous cyst Recommend she return once her respiratory illness is better for a sebaceous cyst removal.   Return if symptoms worsen or fail to improve.   Haydee Salter, MD

## 2022-12-27 DIAGNOSIS — M5442 Lumbago with sciatica, left side: Secondary | ICD-10-CM | POA: Diagnosis not present

## 2022-12-27 DIAGNOSIS — M9901 Segmental and somatic dysfunction of cervical region: Secondary | ICD-10-CM | POA: Diagnosis not present

## 2022-12-27 DIAGNOSIS — M9903 Segmental and somatic dysfunction of lumbar region: Secondary | ICD-10-CM | POA: Diagnosis not present

## 2022-12-27 DIAGNOSIS — M9904 Segmental and somatic dysfunction of sacral region: Secondary | ICD-10-CM | POA: Diagnosis not present

## 2022-12-28 ENCOUNTER — Other Ambulatory Visit: Payer: Self-pay | Admitting: Family Medicine

## 2022-12-28 DIAGNOSIS — Z1231 Encounter for screening mammogram for malignant neoplasm of breast: Secondary | ICD-10-CM

## 2023-01-03 ENCOUNTER — Ambulatory Visit: Payer: Medicare Other | Admitting: Family Medicine

## 2023-01-12 ENCOUNTER — Telehealth: Payer: Self-pay

## 2023-01-12 NOTE — Progress Notes (Signed)
Care Management & Coordination Services Pharmacy Team  Reason for Encounter: Hypertension  Contacted patient on 01/12/2023 to discuss hypertension disease state.   Recent office visits:  12/20/2022 Dr. Gena Fray MD (PCP) No medication Changes noted  Recent consult visits:  None ID  Hospital visits:  None in previous 6 months  Medications: Outpatient Encounter Medications as of 01/12/2023  Medication Sig   acetaminophen (TYLENOL) 500 MG tablet Take 1-2 tablets (500-1,000 mg total) by mouth at bedtime as needed for moderate pain.   albuterol (VENTOLIN HFA) 108 (90 Base) MCG/ACT inhaler Inhale 1-2 puffs into the lungs every 6 (six) hours as needed.   amLODipine (NORVASC) 5 MG tablet Take 1 tablet (5 mg total) by mouth daily.   Biotin 10000 MCG TABS Take 10,000 mcg by mouth daily.   buPROPion (WELLBUTRIN) 75 MG tablet Take 1 tablet (75 mg total) by mouth daily.   calcium carbonate (OSCAL) 1500 (600 Ca) MG TABS tablet Take 600 mg of elemental calcium by mouth daily with breakfast.   Carboxymethylcellul-Glycerin (LUBRICATING EYE DROPS OP) Place 1 drop into both eyes daily as needed (dry eyes). Visine   cyclobenzaprine (FLEXERIL) 10 MG tablet Take 1 tablet (10 mg total) by mouth 3 (three) times daily as needed for muscle spasms.   Docusate Calcium (STOOL SOFTENER PO) Take 1 tablet by mouth daily.   meloxicam (MOBIC) 15 MG tablet Take 1 tablet (15 mg total) by mouth daily as needed for pain. (Patient not taking: Reported on 12/20/2022)   polyethylene glycol (MIRALAX / GLYCOLAX) 17 g packet Take 17 g by mouth daily. Takes an extra scoop in the evening as needed for moderate-severe constipation.   pyridOXINE (VITAMIN B-6) 100 MG tablet Take 100 mg by mouth daily.   saccharomyces boulardii (FLORASTOR) 250 MG capsule Take 2 capsules (500 mg total) by mouth 2 (two) times daily. (Patient taking differently: Take 250 mg by mouth daily.)   triamcinolone (NASACORT ALLERGY 24HR) 55 MCG/ACT AERO nasal inhaler  Place 1 spray into the nose daily.   vitamin C (ASCORBIC ACID) 500 MG tablet Take 500 mg by mouth daily.   Vitamin D, Ergocalciferol, (DRISDOL) 1.25 MG (50000 UNIT) CAPS capsule Take 1 capsule (50,000 Units total) by mouth every 30 (thirty) days.   No facility-administered encounter medications on file as of 01/12/2023.    Recent Office Vitals: BP Readings from Last 3 Encounters:  12/20/22 134/86  09/20/22 (!) 144/90  09/12/22 (!) 147/74   Pulse Readings from Last 3 Encounters:  12/20/22 94  09/20/22 85  09/12/22 73    Wt Readings from Last 3 Encounters:  12/20/22 151 lb 12.8 oz (68.9 kg)  10/10/22 149 lb (67.6 kg)  09/20/22 153 lb 3.2 oz (69.5 kg)     Kidney Function Lab Results  Component Value Date/Time   CREATININE 0.85 06/01/2022 02:11 PM   CREATININE 0.72 02/17/2022 09:23 AM   GFR 80.20 02/17/2022 09:23 AM   GFRNONAA >60 06/01/2022 02:11 PM   GFRAA >60 10/17/2018 05:14 AM       Latest Ref Rng & Units 06/01/2022    2:11 PM 02/17/2022    9:23 AM 11/05/2021   10:54 AM  BMP  Glucose 70 - 99 mg/dL 130  95  133   BUN 8 - 23 mg/dL '15  15  15   '$ Creatinine 0.44 - 1.00 mg/dL 0.85  0.72  0.66   Sodium 135 - 145 mmol/L 140  140  136   Potassium 3.5 - 5.1 mmol/L 4.2  4.6  3.4   Chloride 98 - 111 mmol/L 104  105  107   CO2 22 - 32 mmol/L 28  32  23   Calcium 8.9 - 10.3 mg/dL 9.6  9.3  8.1      Current antihypertensive regimen:  Amlodipine 5 mg daily   Patient verbally confirms she is taking the above medications as directed. Yes  How often are you checking your Blood Pressure? infrequently  Patient states she has not check her blood pressure in the last month or so.  Current home BP readings: None ID   Wrist or arm cuff: Patient reports she has a arm cuff.  Caffeine intake: None ID  Salt intake: None ID  Any readings above 180/120? No    What recent interventions/DTPs have been made by any provider to improve Blood Pressure control since last CPP Visit:  None ID  Any recent hospitalizations or ED visits since last visit with CPP? No  What diet changes have been made to improve Blood Pressure Control?  Patient denies any changes made to her diet.   What exercise is being done to improve your Blood Pressure Control?  Patient denies any changes at this time.  Adherence Review: Is the patient currently on ACE/ARB medication? No Does the patient have >5 day gap between last estimated fill dates? No  Star Rating Drugs:  None ID  Care Gaps: None ID   Anderson Malta Clinical Pharmacist Assistant [phone]

## 2023-01-18 ENCOUNTER — Encounter: Payer: Self-pay | Admitting: Family Medicine

## 2023-01-18 ENCOUNTER — Ambulatory Visit (INDEPENDENT_AMBULATORY_CARE_PROVIDER_SITE_OTHER): Payer: Medicare Other | Admitting: Family Medicine

## 2023-01-18 VITALS — BP 134/82 | HR 82 | Temp 97.9°F | Ht 60.0 in | Wt 155.0 lb

## 2023-01-18 DIAGNOSIS — K5909 Other constipation: Secondary | ICD-10-CM | POA: Diagnosis not present

## 2023-01-18 DIAGNOSIS — R058 Other specified cough: Secondary | ICD-10-CM

## 2023-01-18 DIAGNOSIS — N644 Mastodynia: Secondary | ICD-10-CM

## 2023-01-18 NOTE — Assessment & Plan Note (Signed)
We discussed the challenges of managing the symptoms of this. Her current approaches seem to make this more tolerable, but she is disappointed that the issue has not resolved.

## 2023-01-18 NOTE — Patient Instructions (Signed)
Constipation: Our goal is to achieve formed bowel movements daily or every-other-day.  You may need to try different combinations of the following options to find what works best for you - everybody's body works differently so feel free to adjust the dosages as needed.  Some options to help maintain bowel health include:   Dietary changes (more leafy greens, vegetables and fruits; less processed foods) Fiber supplementation (Benefiber, FiberCon, Metamucil, Psyllium, or fiber capsules or gummies). Start slow and increase gradually to full dose. Over-the-counter agents such as: stool softeners (Docusate or Colace) and/or laxatives (Miralax, milk of magnesia)  "Power Pudding" is a natural mixture that may help your constipation.  To make blend 1 cup applesauce, 1 cup wheat bran, and 3/4 cup prune juice, refrigerate and then take 1 tablespoon daily with a large glass of water as needed.

## 2023-01-18 NOTE — Assessment & Plan Note (Signed)
Gloria Werner will continue use of Miralax as needed. I encouraged her to add in a fiber supplement, such as fiber gummies. I also recommend she eat a few dried apricots or plums each day. she should keep hydrated and remain active.

## 2023-01-18 NOTE — Progress Notes (Signed)
[facility] [address] [place] [address] Dept: [phone] Dept Fax: [phone]  Office Visit  Subjective:    Patient ID: Gloria Werner, female    DOB: [birth year], 83 y.o..   MRN: [id number]  Chief Complaint  Patient presents with   Acute Visit    Concerned with having a sore spot under left axilla for 2 months    History of Present Illness:  Patient is in today to discuss her mammogram. She notes that [facility]. They asked her about any breast issues. she mentioned she had a localized area of tenderness and a possible small lump in the outer portion of her left breast. She notes that she has felt this intermittently. [facility] advised ehr that she would need a diagnostic vs. a screening mammogram. She notes her left breast has become heavier with time. She denies any skin changes. She has not had any nipple discharge. She has had no lumps under the arm pits.  Gloria Werner has a history of chronic constipation. She uses Miralax regularly, but notes it is a juggle, as her chronic cough can cause her to sometimes leak stool. She does get crampy pain at times across the upper abdomen. She is not using any sort of fiber supplement.  Gloria Werner  has a history of an upper airway cough syndrome. She has been seeing Dr. Melvyn Novas. She finds this continues to be irritating and a challenge for her. She keeps hard candies in her purse for managing cough during the day. She finds that a sip of Diet Coke seems to help in the evenings/at night.  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   Diverticulosis 11/16/2021   Dyslipidemia 11/16/2021   History of Clostridium difficile infection 11/16/2021   Status post  total replacement of right hip 11/04/2021   DDD (degenerative disc disease), lumbar 09/28/2019   Aortic atherosclerosis ([place]) 09/28/2019   OSA (obstructive sleep apnea) 01/27/2019   Pulmonary nodule 01/27/2019   Allergic rhinitis 11/06/2018   Upper airway cough syndrome 09/24/2018   Essential hypertension 06/03/2018   Anxiety and depression 01/17/2017   Vitamin D deficiency 01/17/2017   Osteopenia 04/08/2015   Irritable bowel syndrome (IBS) 02/03/2008   Past Surgical History:  Procedure Laterality Date   ABDOMINAL HYSTERECTOMY  1988   secondary to prolapse   BLADDER SUSPENSION     A-P with Hyst   CATARACT EXTRACTION Bilateral    INSERTION OF MESH  10/16/2018   Procedure: INSERTION OF MESH;  Surgeon: Ardis Hughs, MD;  Location: [facility];  Service: Urology;;   [place] ARTHROPLASTY Left 06/08/2022   Procedure: REVERSE SHOULDER ARTHROPLASTY;  Surgeon: Justice Britain, MD;  Location: [facility];  Service: Orthopedics;  Laterality: Left;   ROBOTIC ASSISTED LAPAROSCOPIC SACROCOLPOPEXY N/A 10/16/2018   Procedure: XI ROBOTIC ASSISTED LAPAROSCOPIC SACROCOLPOPEXY;  Surgeon: Ardis Hughs, MD;  Location: [facility];  Service: Urology;  Laterality: N/A;   TONSILLECTOMY     TOTAL HIP ARTHROPLASTY Right 11/04/2021   Procedure: RIGHT TOTAL HIP ARTHROPLASTY ANTERIOR APPROACH;  Surgeon: Mcarthur Rossetti, MD;  Location: [facility];  Service: Orthopedics;  Laterality: Right;   Family History  Problem Relation Age of Onset   Emphysema Mother  Lymphoma Mother    Asthma Mother    Cancer Father 63       lung cancer   Coronary artery disease Brother    Cancer Brother        breast cancer   Breast cancer Brother    Cancer Brother        lung cancer   Diabetes Neg Hx    Hypertension Neg Hx    Colon cancer Neg Hx    Esophageal cancer Neg Hx    Rectal cancer Neg Hx    Stomach cancer Neg Hx    Outpatient Medications Prior to Visit  Medication Sig  Dispense Refill   acetaminophen (TYLENOL) 500 MG tablet Take 1-2 tablets (500-1,000 mg total) by mouth at bedtime as needed for moderate pain. 30 tablet 0   albuterol (VENTOLIN HFA) 108 (90 Base) MCG/ACT inhaler Inhale 1-2 puffs into the lungs every 6 (six) hours as needed. 8 g 2   amLODipine (NORVASC) 5 MG tablet Take 1 tablet (5 mg total) by mouth daily. 90 tablet 1   Biotin 10000 MCG TABS Take 10,000 mcg by mouth daily.     buPROPion (WELLBUTRIN) 75 MG tablet Take 1 tablet (75 mg total) by mouth daily. 90 tablet 3   calcium carbonate (OSCAL) 1500 (600 Ca) MG TABS tablet Take 600 mg of elemental calcium by mouth daily with breakfast.     Carboxymethylcellul-Glycerin (LUBRICATING EYE DROPS OP) Place 1 drop into both eyes daily as needed (dry eyes). Visine     cyclobenzaprine (FLEXERIL) 10 MG tablet Take 1 tablet (10 mg total) by mouth 3 (three) times daily as needed for muscle spasms. 30 tablet 1   Docusate Calcium (STOOL SOFTENER PO) Take 1 tablet by mouth daily.     polyethylene glycol (MIRALAX / GLYCOLAX) 17 g packet Take 17 g by mouth daily. Takes an extra scoop in the evening as needed for moderate-severe constipation.     pyridOXINE (VITAMIN B-6) 100 MG tablet Take 100 mg by mouth daily.     saccharomyces boulardii (FLORASTOR) 250 MG capsule Take 2 capsules (500 mg total) by mouth 2 (two) times daily. (Patient taking differently: Take 250 mg by mouth daily.) 120 capsule 3   triamcinolone (NASACORT ALLERGY 24HR) 55 MCG/ACT AERO nasal inhaler Place 1 spray into the nose daily.     vitamin C (ASCORBIC ACID) 500 MG tablet Take 500 mg by mouth daily.     Vitamin D, Ergocalciferol, (DRISDOL) 1.25 MG (50000 UNIT) CAPS capsule Take 1 capsule (50,000 Units total) by mouth every 30 (thirty) days. 3 capsule 3   meloxicam (MOBIC) 15 MG tablet Take 1 tablet (15 mg total) by mouth daily as needed for pain. (Patient not taking: Reported on 01/18/2023) 30 tablet 6   No facility-administered medications prior  to visit.   Allergies  Allergen Reactions   Lexapro [Escitalopram] Diarrhea and Other (See Comments)    headache   Ace Inhibitors Cough   Codeine Nausea And Vomiting     Objective:   Today's Vitals   01/18/23 0903  BP: 134/82  Pulse: 82  Temp: 97.9 F (36.6 C)  TempSrc: Tympanic  SpO2: 98%  Weight: 155 lb (70.3 kg)  Height: 5' (1.524 m)   Body mass index is 30.27 kg/m.   General: Well developed, well nourished. No acute distress. Breast: No lumps are palpable int he outer margins of the left breast. No adnexal masses. Psych: Alert and oriented. Normal mood and affect.  There are no  preventive care reminders to display for this patient.    Assessment & Plan:   Problem List Items Addressed This Visit       Digestive   Chronic constipation    Gloria Werner will continue use of Miralax as needed. I encouraged her to add in a fiber supplement, such as fiber gummies. I also recommend she eat a few dried apricots or plums each day. she should keep hydrated and remain active.        Other   Upper airway cough syndrome    We discussed the challenges of managing the symptoms of this. Her current approaches seem to make this more tolerable, but she is disappointed that the issue has not resolved.      Other Visit Diagnoses     Breast tenderness in female, left    -  Primary   Exam is normal. We will proceed with a diagnostic mammogram to assess.   Relevant Orders   MM Digital Diagnostic Bilat       Return if symptoms worsen or fail to improve.   Haydee Salter, MD

## 2023-01-29 ENCOUNTER — Other Ambulatory Visit: Payer: Self-pay | Admitting: Family Medicine

## 2023-01-29 DIAGNOSIS — N644 Mastodynia: Secondary | ICD-10-CM

## 2023-01-30 DIAGNOSIS — D485 Neoplasm of uncertain behavior of skin: Secondary | ICD-10-CM | POA: Diagnosis not present

## 2023-01-30 DIAGNOSIS — Z08 Encounter for follow-up examination after completed treatment for malignant neoplasm: Secondary | ICD-10-CM | POA: Diagnosis not present

## 2023-01-30 DIAGNOSIS — Z85828 Personal history of other malignant neoplasm of skin: Secondary | ICD-10-CM | POA: Diagnosis not present

## 2023-01-30 DIAGNOSIS — C44311 Basal cell carcinoma of skin of nose: Secondary | ICD-10-CM | POA: Diagnosis not present

## 2023-01-31 DIAGNOSIS — M9901 Segmental and somatic dysfunction of cervical region: Secondary | ICD-10-CM | POA: Diagnosis not present

## 2023-01-31 DIAGNOSIS — M9904 Segmental and somatic dysfunction of sacral region: Secondary | ICD-10-CM | POA: Diagnosis not present

## 2023-01-31 DIAGNOSIS — M5442 Lumbago with sciatica, left side: Secondary | ICD-10-CM | POA: Diagnosis not present

## 2023-01-31 DIAGNOSIS — M9903 Segmental and somatic dysfunction of lumbar region: Secondary | ICD-10-CM | POA: Diagnosis not present

## 2023-02-08 ENCOUNTER — Other Ambulatory Visit: Payer: Self-pay | Admitting: Family Medicine

## 2023-02-08 DIAGNOSIS — I10 Essential (primary) hypertension: Secondary | ICD-10-CM

## 2023-02-12 DIAGNOSIS — H1012 Acute atopic conjunctivitis, left eye: Secondary | ICD-10-CM | POA: Diagnosis not present

## 2023-02-14 ENCOUNTER — Emergency Department (HOSPITAL_BASED_OUTPATIENT_CLINIC_OR_DEPARTMENT_OTHER)
Admission: EM | Admit: 2023-02-14 | Discharge: 2023-02-14 | Disposition: A | Discharge status: Home / Self Care | Payer: Medicare Other | Attending: Emergency Medicine | Admitting: Emergency Medicine

## 2023-02-14 ENCOUNTER — Encounter (HOSPITAL_BASED_OUTPATIENT_CLINIC_OR_DEPARTMENT_OTHER): Payer: Self-pay | Admitting: Pediatrics

## 2023-02-14 ENCOUNTER — Emergency Department (HOSPITAL_BASED_OUTPATIENT_CLINIC_OR_DEPARTMENT_OTHER): Payer: Medicare Other

## 2023-02-14 ENCOUNTER — Other Ambulatory Visit: Payer: Self-pay

## 2023-02-14 DIAGNOSIS — R09A2 Foreign body sensation, throat: Secondary | ICD-10-CM | POA: Diagnosis not present

## 2023-02-14 DIAGNOSIS — J029 Acute pharyngitis, unspecified: Secondary | ICD-10-CM | POA: Diagnosis not present

## 2023-02-14 DIAGNOSIS — R0989 Other specified symptoms and signs involving the circulatory and respiratory systems: Secondary | ICD-10-CM | POA: Diagnosis not present

## 2023-02-14 MED ORDER — DEXAMETHASONE SODIUM PHOSPHATE 10 MG/ML IJ SOLN
10.0000 mg | Freq: Once | INTRAMUSCULAR | Status: AC
  Administered 2023-02-14: 10 mg via INTRAMUSCULAR
  Filled 2023-02-14: qty 1

## 2023-02-14 MED ORDER — LIDOCAINE HCL URETHRAL/MUCOSAL 2 % EX GEL
1.0000 | Freq: Once | CUTANEOUS | Status: DC
  Filled 2023-02-14: qty 11

## 2023-02-14 NOTE — ED Triage Notes (Signed)
Reported might have swallowed a chicken bone last Thursday; stated she feels like her throat is sore and something is sharp is stuck there.

## 2023-02-14 NOTE — ED Provider Notes (Signed)
[facility] Provider Note   CSN: [id number] Arrival date & time: 02/14/23  1621     History Chief Complaint  Patient presents with   Sore Throat    HPI Gloria Werner is a 82 y.o. female presenting for chief complaint of 5 days of sore throat symptoms.  She states that it started after she swallowed a chicken bone.  She did not have any symptoms until yesterday.  Today she started having worsening globus sensation.  It is actually improving this evening.  Has been tolerating p.o. intake.  Denies fevers or chills, nausea vomiting, syncope shortness of breath.  She is otherwise ambulatory tolerating p.o. intake..   Patient's recorded medical, surgical, social, medication list and allergies were reviewed in the Snapshot window as part of the initial history.   Review of Systems   Review of Systems  Constitutional:  Negative for chills and fever.  HENT:  Positive for trouble swallowing. Negative for ear pain and sore throat.   Eyes:  Negative for pain and visual disturbance.  Respiratory:  Negative for cough and shortness of breath.   Cardiovascular:  Negative for chest pain and palpitations.  Gastrointestinal:  Negative for abdominal pain and vomiting.  Genitourinary:  Negative for dysuria and hematuria.  Musculoskeletal:  Negative for arthralgias and back pain.  Skin:  Negative for color change and rash.  Neurological:  Negative for seizures and syncope.  All other systems reviewed and are negative.   Physical Exam Updated Vital Signs BP (!) 174/99 (BP Location: Left Arm)   Pulse 88   Temp 98.2 F (36.8 C) (Oral)   Resp 18   Ht 5' (1.524 m)   Wt 68 kg   LMP 12/25/1986 (Approximate)   SpO2 98%   BMI 29.29 kg/m  Physical Exam Vitals and nursing note reviewed.  Constitutional:      General: She is not in acute distress.    Appearance: She is well-developed.  HENT:     Head: Normocephalic and atraumatic.      Mouth/Throat:     Mouth: Mucous membranes are moist. No oral lesions.     Pharynx: Uvula midline. No pharyngeal swelling, oropharyngeal exudate or posterior oropharyngeal erythema.  Eyes:     Conjunctiva/sclera: Conjunctivae normal.  Cardiovascular:     Rate and Rhythm: Normal rate and regular rhythm.     Heart sounds: No murmur heard. Pulmonary:     Effort: Pulmonary effort is normal. No respiratory distress.     Breath sounds: Normal breath sounds.  Abdominal:     General: There is no distension.     Palpations: Abdomen is soft.     Tenderness: There is no abdominal tenderness. There is no right CVA tenderness or left CVA tenderness.  Musculoskeletal:        General: No swelling or tenderness. Normal range of motion.     Cervical back: Neck supple.  Skin:    General: Skin is warm and dry.  Neurological:     General: No focal deficit present.     Mental Status: She is alert and oriented to person, place, and time. Mental status is at baseline.     Cranial Nerves: No cranial nerve deficit.      ED Course/ Medical Decision Making/ A&P    Procedures Procedures   Medications Ordered in ED Medications  lidocaine (XYLOCAINE) 2 % jelly 1 Application (has no administration in time range)  dexamethasone (DECADRON) injection 10 mg (has  no administration in time range)    Medical Decision Making:    Gloria Werner is a 82 y.o. female who presented to the ED today with chief complaint of globus sensation detailed above.     Complete initial physical exam performed, notably the patient  was hemodynamically stable in no acute distress.      Reviewed and confirmed nursing documentation for past medical history, family history, social history.    Initial Assessment:   Patient presenting with globus sensation after swallowing a chicken bone.  CT revealed no perforation nor foreign body.  Discussed possibility of radiolucent foreign body with the patient.  Offered upper  endoscopy in the emergency room.  However she is already arranged follow-up with ENT and would feel more comfortable following up with ENT in the outpatient setting.  Prescribe dexamethasone to be administered tonight and give patient supportive care as well as strict return precautions.  Patient ambulatory tolerating p.o. intake in no acute distress stable for outpatient care pending symptomatic reassessment.  Disposition:  I have considered need for hospitalization, however, considering all of the above, I believe this patient is stable for discharge at this time.  Patient/family educated about specific return precautions for given chief complaint and symptoms.  Patient/family educated about follow-up with PCP.     Patient/family expressed understanding of return precautions and need for follow-up. Patient spoken to regarding all imaging and laboratory results and appropriate follow up for these results. All education provided in verbal form with additional information in written form. Time was allowed for answering of patient questions. Patient discharged.    Emergency Department Medication Summary:   Medications  lidocaine (XYLOCAINE) 2 % jelly 1 Application (has no administration in time range)  dexamethasone (DECADRON) injection 10 mg (has no administration in time range)         Clinical Impression:  1. Globus syndrome      Discharge   Final Clinical Impression(s) / ED Diagnoses Final diagnoses:  Globus syndrome    Rx / DC Orders ED Discharge Orders     None         Tretha Sciara, MD 02/14/23 [phone]

## 2023-02-16 ENCOUNTER — Encounter: Payer: Self-pay | Admitting: Pharmacist

## 2023-02-16 NOTE — Progress Notes (Signed)
[facility])                                            [facility]ney Team                                        Statin Quality Measure Assessment    02/16/2023  TICEY COTTLE 1941/11/22 [id number]  Dr. Gena Fray,   I am a [facility] clinical pharmacist that reviews patients for statin quality initiatives.     Per review of chart and payor information, Ms. Gloria Werner has a diagnosis of cardiovascular disease but is not currently filling a statin prescription.  This places patient into the [facility] (Statin Use In Patients with Cardiovascular Disease) measure for CMS.    Patient has documented trials of rosuvastatin  with reported muscle aches, but no corresponding CPT codes that would exclude patient from [facility] measure.  Code for past statin intolerance  (required annually)  Provider Requirements: Must asociate code during an office visit or telehealth encounter   Drug Induced Myopathy G72.0   Myalgia M79.1   Myositis, unspecified M60.9   Myopathy, unspecified G72.9   Rhabdomyolysis M62.82    Thank you!  Curlene Labrum, PharmD [place] Pharmacist Office: [phone]

## 2023-02-19 ENCOUNTER — Encounter: Payer: Self-pay | Admitting: Family Medicine

## 2023-02-19 ENCOUNTER — Ambulatory Visit (INDEPENDENT_AMBULATORY_CARE_PROVIDER_SITE_OTHER): Payer: Medicare Other | Admitting: Family Medicine

## 2023-02-19 VITALS — BP 120/72 | HR 82 | Temp 97.0°F | Ht 60.0 in | Wt 151.4 lb

## 2023-02-19 DIAGNOSIS — T466X5A Adverse effect of antihyperlipidemic and antiarteriosclerotic drugs, initial encounter: Secondary | ICD-10-CM | POA: Diagnosis not present

## 2023-02-19 DIAGNOSIS — K5909 Other constipation: Secondary | ICD-10-CM | POA: Diagnosis not present

## 2023-02-19 DIAGNOSIS — R058 Other specified cough: Secondary | ICD-10-CM | POA: Diagnosis not present

## 2023-02-19 DIAGNOSIS — E559 Vitamin D deficiency, unspecified: Secondary | ICD-10-CM

## 2023-02-19 DIAGNOSIS — M791 Myalgia, unspecified site: Secondary | ICD-10-CM

## 2023-02-19 DIAGNOSIS — F419 Anxiety disorder, unspecified: Secondary | ICD-10-CM

## 2023-02-19 DIAGNOSIS — J309 Allergic rhinitis, unspecified: Secondary | ICD-10-CM

## 2023-02-19 DIAGNOSIS — I10 Essential (primary) hypertension: Secondary | ICD-10-CM

## 2023-02-19 DIAGNOSIS — F32A Depression, unspecified: Secondary | ICD-10-CM | POA: Diagnosis not present

## 2023-02-19 DIAGNOSIS — E785 Hyperlipidemia, unspecified: Secondary | ICD-10-CM | POA: Diagnosis not present

## 2023-02-19 LAB — LIPID PANEL
Cholesterol: 192 mg/dL (ref 0–200)
HDL: 44.2 mg/dL (ref >39.00)
LDL Cholesterol: 121 mg/dL — ABNORMAL HIGH (ref 0–99)
NonHDL: 147.66
Total CHOL/HDL Ratio: 4
Triglycerides: 131 mg/dL (ref 0.0–149.0)
VLDL: 26.2 mg/dL (ref 0.0–40.0)

## 2023-02-19 LAB — VITAMIN D 25 HYDROXY (VIT D DEFICIENCY, FRACTURES): VITD: 22.35 ng/mL — ABNORMAL LOW (ref 30.00–100.00)

## 2023-02-19 MED ORDER — AMLODIPINE BESYLATE 5 MG PO TABS: 5.0000 mg | ORAL_TABLET | Freq: Every day | ORAL | 0 refills | Status: DC

## 2023-02-19 MED ORDER — VITAMIN D (ERGOCALCIFEROL) 1.25 MG (50000 UNIT) PO CAPS: 50000.0000 [IU] | ORAL_CAPSULE | ORAL | 3 refills | Status: DC

## 2023-02-19 NOTE — Assessment & Plan Note (Signed)
Intolerant of statins. We will recheck her lipids today.

## 2023-02-19 NOTE — Assessment & Plan Note (Signed)
Fiber approach has helped. She should continue this. We didi discuss doing daily Kegel exercises to reduce stool leakage with cough.

## 2023-02-19 NOTE — Assessment & Plan Note (Signed)
We discussed how chronic cough can be related to allergic rhinitis. I recommend she continue use of her nasal steroid spray and consider taking an antihistamine daily.

## 2023-02-19 NOTE — Assessment & Plan Note (Signed)
As above.

## 2023-02-19 NOTE — Assessment & Plan Note (Signed)
Discussed use of hard candies to reduce cough. We will continue to follow this.

## 2023-02-19 NOTE — Assessment & Plan Note (Signed)
I will recheck her Vit D level today 

## 2023-02-19 NOTE — Assessment & Plan Note (Signed)
Gloria Werner PHQ is only mildly above 10. We discussed continue her current bupropion dose of 75 mg daily for now. We will continue to monitor her mood.

## 2023-02-19 NOTE — Assessment & Plan Note (Signed)
Blood pressure is in good control. Continue amlodipine 5 mg daily.

## 2023-02-19 NOTE — Progress Notes (Signed)
[facility] [address] [place] [address] Dept: [phone] Dept Fax: [phone]  Chronic Care Office Visit  Subjective:    Patient ID: Gloria Werner, female    DOB: 1941/11/09, 82 y.o..   MRN: [id number]  Chief Complaint  Patient presents with   Medical Management of Chronic Issues    6 month f/u.  Fasting today.  C/o still having lingering cough. Taking OTC cough meds and allergy meds.  Also wants to discuss Bupropion dose.    History of Present Illness:  Patient is in today for reassessment of chronic medical issues.  Gloria Werner has a history of hypertension. She is managed on amlodipine 5 mg daily.   Gloria Werner has a history of abdominal atherosclerosis and dyslipidemia. Her cardiologist had her taking rosuvastatin 5 mg three times a week to try and prevent further ASCVD. However, she notes that she continues to have generalized muscle aches that she attributed to the medication. She has since stopped this.   Gloria Werner has a history of anxiety with depression . She is managed on bupropion 75 mg daily. Overall, her mood has been doing well. She states her daughter was concerned that maybe she needed a higher dose. However, Gloria Werner feels she is doing well currently.  Gloria Werner has a history of an upper airway cough syndrome. She continues to have this persistent cough. She notes occasionally when she coughs , she will leak a small amount of stool.  She does not leak urine.  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   DDD (degenerative disc disease), lumbar  09/28/2019   Aortic atherosclerosis ([place]) 09/28/2019   Gastroesophageal reflux disease without esophagitis 02/04/2019   OSA (obstructive sleep apnea) 01/27/2019   Pulmonary nodule 01/27/2019   Allergic rhinitis 11/06/2018   Upper airway cough syndrome 09/24/2018   Primary osteoarthritis of first carpometacarpal joint of left hand 07/03/2018   Essential hypertension 06/03/2018   Anxiety and depression 01/17/2017   Vitamin D deficiency 01/17/2017   Osteopenia 04/08/2015   Irritable bowel syndrome (IBS) 02/03/2008   Past Surgical History:  Procedure Laterality Date   ABDOMINAL HYSTERECTOMY  1988   secondary to prolapse   BLADDER SUSPENSION     A-P with Hyst   CATARACT EXTRACTION Bilateral    INSERTION OF MESH  10/16/2018   Procedure: INSERTION OF MESH;  Surgeon: Ardis Hughs, MD;  Location: [facility];  Service: Urology;;   [place] ARTHROPLASTY Left 06/08/2022   Procedure: REVERSE SHOULDER ARTHROPLASTY;  Surgeon: Justice Britain, MD;  Location: [facility];  Service: Orthopedics;  Laterality: Left;   ROBOTIC ASSISTED LAPAROSCOPIC SACROCOLPOPEXY N/A 10/16/2018   Procedure: XI ROBOTIC ASSISTED LAPAROSCOPIC SACROCOLPOPEXY;  Surgeon: Ardis Hughs, MD;  Location: [facility];  Service: Urology;  Laterality: N/A;   TONSILLECTOMY     TOTAL HIP ARTHROPLASTY Right 11/04/2021   Procedure: RIGHT TOTAL HIP ARTHROPLASTY ANTERIOR APPROACH;  Surgeon: Mcarthur Rossetti, MD;  Location: [facility];  Service: Orthopedics;  Laterality: Right;   Family History  Problem Relation Age of Onset   Emphysema Mother    Lymphoma Mother  Asthma Mother    Cancer Father 27       lung cancer   Coronary artery disease Brother    Cancer Brother        breast cancer   Breast cancer Brother    Cancer Brother        lung cancer   Diabetes Neg Hx    Hypertension Neg Hx    Colon cancer Neg Hx    Esophageal cancer Neg Hx    Rectal cancer Neg Hx    Stomach cancer Neg Hx     Outpatient Medications Prior to Visit  Medication Sig Dispense Refill   acetaminophen (TYLENOL) 500 MG tablet Take 1-2 tablets (500-1,000 mg total) by mouth at bedtime as needed for moderate pain. 30 tablet 0   albuterol (VENTOLIN HFA) 108 (90 Base) MCG/ACT inhaler Inhale 1-2 puffs into the lungs every 6 (six) hours as needed. 8 g 2   Biotin 10000 MCG TABS Take 10,000 mcg by mouth daily.     buPROPion (WELLBUTRIN) 75 MG tablet Take 1 tablet (75 mg total) by mouth daily. 90 tablet 3   calcium carbonate (OSCAL) 1500 (600 Ca) MG TABS tablet Take 600 mg of elemental calcium by mouth daily with breakfast.     Carboxymethylcellul-Glycerin (LUBRICATING EYE DROPS OP) Place 1 drop into both eyes daily as needed (dry eyes). Visine     cyclobenzaprine (FLEXERIL) 10 MG tablet Take 1 tablet (10 mg total) by mouth 3 (three) times daily as needed for muscle spasms. 30 tablet 1   Docusate Calcium (STOOL SOFTENER PO) Take 1 tablet by mouth daily.     polyethylene glycol (MIRALAX / GLYCOLAX) 17 g packet Take 17 g by mouth daily. Takes an extra scoop in the evening as needed for moderate-severe constipation.     pyridOXINE (VITAMIN B-6) 100 MG tablet Take 100 mg by mouth daily.     saccharomyces boulardii (FLORASTOR) 250 MG capsule Take 2 capsules (500 mg total) by mouth 2 (two) times daily. (Patient taking differently: Take 250 mg by mouth daily.) 120 capsule 3   triamcinolone (NASACORT ALLERGY 24HR) 55 MCG/ACT AERO nasal inhaler Place 1 spray into the nose daily.     vitamin C (ASCORBIC ACID) 500 MG tablet Take 500 mg by mouth daily.     amLODipine (NORVASC) 5 MG tablet Take 1 tablet by mouth once daily 90 tablet 0   Vitamin D, Ergocalciferol, (DRISDOL) 1.25 MG (50000 UNIT) CAPS capsule Take 1 capsule (50,000 Units total) by mouth every 30 (thirty) days. 3 capsule 3   meloxicam (MOBIC) 15 MG tablet Take 1 tablet (15 mg total) by mouth daily as needed for pain. (Patient not taking: Reported on 01/18/2023) 30  tablet 6   No facility-administered medications prior to visit.   Allergies  Allergen Reactions   Lexapro [Escitalopram] Diarrhea and Other (See Comments)    headache   Ace Inhibitors Cough   Codeine Nausea And Vomiting   Objective:   Today's Vitals   02/19/23 0857  BP: 120/72  Pulse: 82  Temp: (!) 97 F (36.1 C)  TempSrc: Temporal  SpO2: 95%  Weight: 151 lb 6.4 oz (68.7 kg)  Height: 5' (1.524 m)   Body mass index is 29.57 kg/m.   General: Well developed, well nourished. No acute distress. Psych: Alert and oriented. Normal mood and affect.  Health Maintenance Due  Topic Date Due   MAMMOGRAM  02/10/2023        02/19/2023    9:18 AM  01/18/2023    9:09 AM 10/10/2022    1:21 PM  Depression screen PHQ 2/9  Decreased Interest 1 0 0  Down, Depressed, Hopeless 1 0 0  PHQ - 2 Score 2 0 0  Altered sleeping 3    Tired, decreased energy 3    Change in appetite 3    Feeling bad or failure about yourself  0    Trouble concentrating 0    Moving slowly or fidgety/restless 0    Suicidal thoughts 0    PHQ-9 Score 11    Difficult doing work/chores Somewhat difficult        02/19/2023    9:18 AM 10/13/2021   11:37 AM  GAD 7 : Generalized Anxiety Score  Nervous, Anxious, on Edge 1 1  Control/stop worrying 1 1  Worry too much - different things 1 0  Trouble relaxing 1 1  Restless 0 1  Easily annoyed or irritable 0 1  Afraid - awful might happen 0 0  Total GAD 7 Score 4 5  Anxiety Difficulty  Not difficult at all    Assessment & Plan:   Problem List Items Addressed This Visit       Cardiovascular and Mediastinum   Essential hypertension - Primary    Blood pressure is in good control. Continue amlodipine 5 mg daily.      Relevant Medications   amLODipine (NORVASC) 5 MG tablet     Respiratory   Allergic rhinitis    We discussed how chronic cough can be related to allergic rhinitis. I recommend she continue use of her nasal steroid spray and consider taking an  antihistamine daily.        Digestive   Chronic constipation    Fiber approach has helped. She should continue this. We didi discuss doing daily Kegel exercises to reduce stool leakage with cough.        Other   Anxiety and depression    Ms. Mccorvey's PHQ is only mildly above 10. We discussed continue her current bupropion dose of 75 mg daily for now. We will continue to monitor her mood.      Vitamin D deficiency    I will recheck her Vit. D level today.      Relevant Medications   Vitamin D, Ergocalciferol, (DRISDOL) 1.25 MG (50000 UNIT) CAPS capsule   Other Relevant Orders   VITAMIN D 25 Hydroxy (Vit-D Deficiency, Fractures)   Upper airway cough syndrome    Discussed use of hard candies to reduce cough. We will continue to follow this.      Dyslipidemia    Intolerant of statins. We will recheck her lipids today.      Relevant Orders   Lipid panel   Myalgia due to statin    As above.       Return in about 3 months (around 05/20/2023) for Reassessment.   Haydee Salter, MD

## 2023-02-21 DIAGNOSIS — R053 Chronic cough: Secondary | ICD-10-CM | POA: Diagnosis not present

## 2023-02-26 ENCOUNTER — Telehealth (INDEPENDENT_AMBULATORY_CARE_PROVIDER_SITE_OTHER): Payer: Medicare Other | Admitting: Family Medicine

## 2023-02-26 ENCOUNTER — Encounter: Payer: Self-pay | Admitting: Family Medicine

## 2023-02-26 VITALS — Ht 60.0 in | Wt 150.0 lb

## 2023-02-26 DIAGNOSIS — E785 Hyperlipidemia, unspecified: Secondary | ICD-10-CM

## 2023-02-26 DIAGNOSIS — E559 Vitamin D deficiency, unspecified: Secondary | ICD-10-CM | POA: Diagnosis not present

## 2023-02-26 DIAGNOSIS — Z20822 Contact with and (suspected) exposure to covid-19: Secondary | ICD-10-CM | POA: Diagnosis not present

## 2023-02-26 MED ORDER — VITAMIN D (ERGOCALCIFEROL) 1.25 MG (50000 UNIT) PO CAPS: 50000.0000 [IU] | ORAL_CAPSULE | ORAL | 3 refills | Status: DC

## 2023-02-26 MED ORDER — EZETIMIBE 10 MG PO TABS: 10.0000 mg | ORAL_TABLET | Freq: Every day | ORAL | 3 refills | Status: DC

## 2023-02-26 NOTE — Progress Notes (Signed)
[facility] [address] [place] [address] Dept: [phone] Dept Fax: [phone]  Virtual Video Visit  I connected with Gearlean Alf on 02/26/23 at  1:20 PM EST by a video enabled telemedicine application and verified that I am speaking with the correct person using two identifiers.  Location patient: Home Location provider: Clinic Persons participating in the virtual visit: Patient, Provider  I discussed the limitations of evaluation and management by telemedicine and the availability of in person appointments. The patient expressed understanding and agreed to proceed.  Chief Complaint  Patient presents with   Medical Management of Chronic Issues    F/u to discuss labs. Also c/o having some sinus drainage, and low grade fever x 3 days.  Neg home covid test, but husband tested positive for covid 02/25/23.      SUBJECTIVE:  HPI: Gloria Werner is a 82 y.o. female who presents to discuss results of her recent lab tests.   Ms. Marinas has a history of abdominal atherosclerosis and dyslipidemia. Her cardiologist had her taking rosuvastatin 5 mg three times a week to try and prevent further ASCVD. However, she noted that she continues to have generalized muscle aches that she attributed to the medication. She has since stopped this.   Ms. Paolo has a history of Vitamin D deficiency. She has been being treated with Vitamin D 50,000 units once a month.  Ms. Mundie notes her husband has been sick recently and tested positive for COVID yesterday. She has had some developing symptoms as well. Her test was neg. 2 days ago. She is overall tolerating her symptoms. She is having some nasal congestion and sinus pressure.  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   DDD (degenerative disc disease), lumbar 09/28/2019   Aortic atherosclerosis ([place]) 09/28/2019   Gastroesophageal reflux disease without esophagitis 02/04/2019   OSA (obstructive sleep apnea) 01/27/2019   Pulmonary nodule 01/27/2019   Allergic rhinitis 11/06/2018   Upper airway cough syndrome 09/24/2018   Primary osteoarthritis of first carpometacarpal joint of left hand 07/03/2018   Essential hypertension 06/03/2018   Anxiety and depression 01/17/2017   Vitamin D insufficiency 01/17/2017   Osteopenia 04/08/2015   Irritable bowel syndrome (IBS) 02/03/2008   Past Surgical History:  Procedure Laterality Date   ABDOMINAL HYSTERECTOMY  1988   secondary to prolapse   BLADDER SUSPENSION     A-P with Hyst   CATARACT EXTRACTION Bilateral    INSERTION OF MESH  10/16/2018   Procedure: INSERTION OF MESH;  Surgeon: Ardis Hughs, MD;  Location: [facility];  Service: Urology;;   NASAL SINUS [place] ARTHROPLASTY Left 06/08/2022   Procedure: REVERSE SHOULDER ARTHROPLASTY;  Surgeon: Justice Britain, MD;  Location: [facility];  Service: Orthopedics;  Laterality: Left;   ROBOTIC ASSISTED LAPAROSCOPIC SACROCOLPOPEXY N/A 10/16/2018   Procedure: XI ROBOTIC ASSISTED LAPAROSCOPIC SACROCOLPOPEXY;  Surgeon: Ardis Hughs, MD;  Location: [facility];  Service: Urology;  Laterality: N/A;   TONSILLECTOMY     TOTAL HIP ARTHROPLASTY Right 11/04/2021   Procedure: RIGHT TOTAL HIP ARTHROPLASTY ANTERIOR APPROACH;  Surgeon: Mcarthur Rossetti, MD;  Location: [facility];  Service: Orthopedics;  Laterality: Right;   Family History  Problem Relation Age of Onset   Emphysema Mother    Lymphoma Mother    Asthma Mother    Cancer Father 42       lung cancer   Coronary artery disease Brother    Cancer Brother        breast cancer    Breast cancer Brother    Cancer Brother        lung cancer   Diabetes Neg Hx    Hypertension Neg Hx    Colon cancer Neg Hx    Esophageal cancer Neg Hx    Rectal cancer Neg Hx    Stomach cancer Neg Hx    Social History   Tobacco Use   Smoking status: Never   Smokeless tobacco: Never  Vaping Use   Vaping Use: Never used  Substance Use Topics   Alcohol use: No   Drug use: No    Current Outpatient Medications:    acetaminophen (TYLENOL) 500 MG tablet, Take 1-2 tablets (500-1,000 mg total) by mouth at bedtime as needed for moderate pain., Disp: 30 tablet, Rfl: 0   albuterol (VENTOLIN HFA) 108 (90 Base) MCG/ACT inhaler, Inhale 1-2 puffs into the lungs every 6 (six) hours as needed., Disp: 8 g, Rfl: 2   amLODipine (NORVASC) 5 MG tablet, Take 1 tablet (5 mg total) by mouth daily., Disp: 90 tablet, Rfl: 0   Biotin 10000 MCG TABS, Take 10,000 mcg by mouth daily., Disp: , Rfl:    buPROPion (WELLBUTRIN) 75 MG tablet, Take 1 tablet (75 mg total) by mouth daily., Disp: 90 tablet, Rfl: 3   calcium carbonate (OSCAL) 1500 (600 Ca) MG TABS tablet, Take 600 mg of elemental calcium by mouth daily with breakfast., Disp: , Rfl:    Carboxymethylcellul-Glycerin (LUBRICATING EYE DROPS OP), Place 1 drop into both eyes daily as needed (dry eyes). Visine, Disp: , Rfl:    cyclobenzaprine (FLEXERIL) 10 MG tablet, Take 1 tablet (10 mg total) by mouth 3 (three) times daily as needed for muscle spasms., Disp: 30 tablet, Rfl: 1   Docusate Calcium (STOOL SOFTENER PO), Take 1 tablet by mouth daily., Disp: , Rfl:    meloxicam (MOBIC) 15 MG tablet, Take 1 tablet (15 mg total) by mouth daily as needed for pain., Disp: 30 tablet, Rfl: 6   polyethylene glycol (MIRALAX / GLYCOLAX) 17 g packet, Take 17 g by mouth daily. Takes an extra scoop in the evening as needed for moderate-severe constipation., Disp: , Rfl:    pyridOXINE (VITAMIN B-6) 100 MG tablet, Take 100 mg by mouth daily., Disp: , Rfl:    saccharomyces  boulardii (FLORASTOR) 250 MG capsule, Take 2 capsules (500 mg total) by mouth 2 (two) times daily. (Patient taking differently: Take 250 mg by mouth daily.), Disp: 120 capsule, Rfl: 3   triamcinolone (NASACORT ALLERGY 24HR) 55 MCG/ACT AERO nasal inhaler, Place 1 spray into the nose daily., Disp: , Rfl:    vitamin C (ASCORBIC ACID) 500 MG tablet, Take 500 mg by mouth daily., Disp: , Rfl:    Vitamin D, Ergocalciferol, (DRISDOL) 1.25 MG (50000 UNIT) CAPS capsule, Take 1 capsule (50,000 Units total) by mouth every 30 (thirty) days., Disp: 3 capsule, Rfl: 3 Allergies  Allergen Reactions   Lexapro [Escitalopram] Diarrhea and Other (See Comments)    headache   Ace Inhibitors Cough   Codeine Nausea And Vomiting   ROS: See pertinent positives and negatives per HPI.  OBSERVATIONS/OBJECTIVE:  VITALS per patient if  applicable: Today's Vitals   02/26/23 1314  Weight: 150 lb (68 kg)  Height: 5' (1.524 m)   Body mass index is 29.29 kg/m.    GENERAL: Alert and oriented. Appears well and in no acute distress. HEENT: Atraumatic. Eyes clear. No obvious abnormalities on inspection of external nose and ears. NECK: Normal movements of the head and neck. LUNGS: On inspection, no signs of respiratory distress. Breathing rate appears normal. No obvious gross SOB, gasping or wheezing, and no conversational dyspnea. CV: No obvious cyanosis. MS: Moves all visible extremities without noticeable abnormality. PSYCH/NEURO: Pleasant and cooperative. No obvious depression or anxiety. Speech and thought processing grossly intact.  Lab Results: Lab Results  Component Value Date   CHOL 192 02/19/2023   HDL 44.20 02/19/2023   LDLCALC 121 (H) 02/19/2023   LDLDIRECT 84.0 08/03/2020   TRIG 131.0 02/19/2023   CHOLHDL 4 02/19/2023   Lab Results  Component Value Date   VD25OH 22.35 (L) 02/19/2023   ASSESSMENT AND PLAN:  Problem List Items Addressed This Visit       Other   Vitamin D insufficiency     Vitamin D level remains insufficient.  I think she is under dosing taking her Vitamin D only once a month. I will move her up to weekly.      Relevant Medications   Vitamin D, Ergocalciferol, (DRISDOL) 1.25 MG (50000 UNIT) CAPS capsule   Dyslipidemia - Primary    Lipids are above goal. I recommend we start her on Zetia and reassess her lipids in 6 weeks.      Relevant Medications   ezetimibe (ZETIA) 10 MG tablet   Other Visit Diagnoses     Exposure to COVID-19 virus       I recommend she retest. If positive, she can follow up with me. Otherwise routine care for viral illness.        I discussed the assessment and treatment plan with the patient. The patient was provided an opportunity to ask questions and all were answered. The patient agreed with the plan and demonstrated an understanding of the instructions.   The patient was advised to call back or seek an in-person evaluation if the symptoms worsen or if the condition fails to improve as anticipated.  Return in about 6 weeks (around 04/09/2023) for Reassessment.   Haydee Salter, MD

## 2023-02-26 NOTE — Assessment & Plan Note (Signed)
Lipids are above goal. I recommend we start her on Zetia and reassess her lipids in 6 weeks.

## 2023-02-26 NOTE — Assessment & Plan Note (Signed)
Vitamin D level remains insufficient.  I think she is under dosing taking her Vitamin D only once a month. I will move her up to weekly.

## 2023-03-12 DIAGNOSIS — J019 Acute sinusitis, unspecified: Secondary | ICD-10-CM | POA: Diagnosis not present

## 2023-03-12 DIAGNOSIS — R051 Acute cough: Secondary | ICD-10-CM | POA: Diagnosis not present

## 2023-03-13 ENCOUNTER — Ambulatory Visit
Admission: RE | Admit: 2023-03-13 | Discharge: 2023-03-13 | Disposition: A | Discharge status: Home / Self Care | Payer: Medicare Other | Source: Ambulatory Visit | Attending: Family Medicine | Admitting: Family Medicine

## 2023-03-13 DIAGNOSIS — N644 Mastodynia: Secondary | ICD-10-CM

## 2023-03-13 DIAGNOSIS — R928 Other abnormal and inconclusive findings on diagnostic imaging of breast: Secondary | ICD-10-CM | POA: Diagnosis not present

## 2023-04-26 ENCOUNTER — Ambulatory Visit: Payer: Medicare Other

## 2023-04-26 ENCOUNTER — Telehealth: Payer: Self-pay

## 2023-04-26 DIAGNOSIS — F32A Depression, unspecified: Secondary | ICD-10-CM

## 2023-04-26 DIAGNOSIS — M791 Myalgia, unspecified site: Secondary | ICD-10-CM

## 2023-04-26 DIAGNOSIS — E785 Hyperlipidemia, unspecified: Secondary | ICD-10-CM

## 2023-04-26 MED ORDER — BUPROPION HCL ER (XL) 150 MG PO TB24: 150.0000 mg | ORAL_TABLET | ORAL | 1 refills | Status: DC

## 2023-04-26 NOTE — Progress Notes (Signed)
Per Clinical pharmacist, please let patient know that I spoke with Dr. Veto Kemps and we were in agreement about increasing her bupropion to bupropion XL 150 mg daily (this is slightly different than what I had spoken with her about).   Patient is asking if it is the same medication just with a higher dose because of the XL.Notified Clinical pharmacist.   Per Clinical pharmacist, Yes it is same medication just a higher dose, and longer lasting. It will still be one tablet in the mornings.  Patient verbalized understanding, and confirm her pharmacy is [facility] in [place].Patient is aware to return my call if she has any questions that may arise.  Everlean Cherry Clinical Pharmacist Assistant [phone]

## 2023-04-26 NOTE — Progress Notes (Signed)
Care Management & Coordination Services Pharmacy Note  04/26/2023 Name:  Gloria Werner MRN:  [id number] DOB:  1941/06/25  Summary: Patient presents for follow-up consult.   -Patient reports she is tolerating Ezetimibe well so far. She denies worsening muscle aches or cramping.    -Patient reports worsening depression symptoms, primarily fatigue and anhedonia. She has support within her family, but declines counseling at this visit. She was interested in adjusting her medication regimen to help with her symptoms.   Recommendations/Changes made from today's visit:  -CHANGE bupropion to bupropion XL 150 mg daily.   Follow up plan: -HC PHQ9 assessment in 4 weeks.  -CPP follow-up 6 months    Subjective: Gloria Werner is an 82 y.o. year old female who is a primary patient of Rudd, Bertram Millard, MD.  The care coordination team was consulted for assistance with disease management and care coordination needs.    Engaged with patient by telephone for follow up visit.  Recent office visits: 02/26/23: Patient presented to Dr. Veto Kemps for follow-up. Ezetimibe 10 mg daily.  02/19/23: Patient presented to Dr. Veto Kemps for follow-up. 01/18/23: Patient presented to Dr. Veto Kemps for follow-up.  Recent consult visits: 02/21/23: Patient presented to Dr. Jenne Pane [facility] visits: 02/14/23: patient presented to ED for Globus syndrome   Objective:  Lab Results  Component Value Date   CREATININE 0.85 06/01/2022   BUN 15 06/01/2022   GFR 80.20 02/17/2022   GFRNONAA >60 06/01/2022   GFRAA >60 10/17/2018   NA 140 06/01/2022   K 4.2 06/01/2022   CALCIUM 9.6 06/01/2022   CO2 28 06/01/2022   GLUCOSE 130 (H) 06/01/2022    Lab Results  Component Value Date/Time   HGBA1C 5.6 02/17/2022 09:23 AM   GFR 80.20 02/17/2022 09:23 AM   GFR 64.04 08/03/2020 11:35 AM    Last diabetic Eye exam: No results found for: "HMDIABEYEEXA"  Last diabetic Foot exam: No results found for: "HMDIABFOOTEX"    Lab Results  Component Value Date   CHOL 192 02/19/2023   HDL 44.20 02/19/2023   LDLCALC 121 (H) 02/19/2023   LDLDIRECT 84.0 08/03/2020   TRIG 131.0 02/19/2023   CHOLHDL 4 02/19/2023       Latest Ref Rng & Units 02/17/2022    9:23 AM 08/03/2020   11:35 AM 12/29/2019    8:05 AM  Hepatic Function  Total Protein 6.0 - 8.3 g/dL 6.7  7.0  7.3   Albumin 3.5 - 5.2 g/dL 4.4  4.4  4.5   AST 0 - 37 U/L 19  21  27    ALT 0 - 35 U/L 13  16  21    Alk Phosphatase 39 - 117 U/L 54  54  53   Total Bilirubin 0.2 - 1.2 mg/dL 0.4  0.5  0.4     Lab Results  Component Value Date/Time   TSH 1.64 09/29/2019 08:39 AM   TSH 2.02 09/04/2018 07:53 AM       Latest Ref Rng & Units 06/01/2022    2:11 PM 02/17/2022    9:23 AM 11/05/2021   10:54 AM  CBC  WBC 4.0 - 10.5 K/uL 7.0  4.4  10.9   Hemoglobin 12.0 - 15.0 g/dL 40.9  81.1  91.4   Hematocrit 36.0 - 46.0 % 40.9  39.8  32.6   Platelets 150 - 400 K/uL 201  185.0  146     Lab Results  Component Value Date/Time   VD25OH 22.35 (L) 02/19/2023 09:59 AM  VD25OH 35.18 02/17/2022 09:23 AM   VITAMINB12 454 04/08/2015 09:04 AM    Clinical ASCVD: No  The 10-year ASCVD risk score (Arnett DK, et al., 2019) is: 27%   Values used to calculate the score:     Age: 72 years     Sex: Female     Is Non-Hispanic African American: No     Diabetic: No     Tobacco smoker: No     Systolic Blood Pressure: 120 mmHg     Is BP treated: Yes     HDL Cholesterol: 44.2 mg/dL     Total Cholesterol: 192 mg/dL       1/61/0960    4:54 AM 01/18/2023    9:09 AM 10/10/2022    1:21 PM  Depression screen PHQ 2/9  Decreased Interest 1 0 0  Down, Depressed, Hopeless 1 0 0  PHQ - 2 Score 2 0 0  Altered sleeping 3    Tired, decreased energy 3    Change in appetite 3    Feeling bad or failure about yourself  0    Trouble concentrating 0    Moving slowly or fidgety/restless 0    Suicidal thoughts 0    PHQ-9 Score 11    Difficult doing work/chores Somewhat difficult        Social History   Tobacco Use  Smoking Status Never  Smokeless Tobacco Never   BP Readings from Last 3 Encounters:  02/19/23 120/72  02/14/23 (!) 172/91  01/18/23 134/82   Pulse Readings from Last 3 Encounters:  02/19/23 82  02/14/23 83  01/18/23 82   Wt Readings from Last 3 Encounters:  02/26/23 150 lb (68 kg)  02/19/23 151 lb 6.4 oz (68.7 kg)  02/14/23 150 lb (68 kg)   BMI Readings from Last 3 Encounters:  02/26/23 29.29 kg/m  02/19/23 29.57 kg/m  02/14/23 29.29 kg/m    Allergies  Allergen Reactions   Lexapro [Escitalopram] Diarrhea and Other (See Comments)    headache   Ace Inhibitors Cough   Codeine Nausea And Vomiting    Medications Reviewed Today     Reviewed by Waymond Cera, CMA (Certified Medical Assistant) on 02/26/23 at 1316  Med List Status: <None>   Medication Order Taking? Sig Documenting Provider Last Dose Status Informant  acetaminophen (TYLENOL) 500 MG tablet [id number] Yes Take 1-2 tablets (500-1,000 mg total) by mouth at bedtime as needed for moderate pain. Garnette Gunner, MD Taking Active   albuterol (VENTOLIN HFA) 108 [phone] Base) MCG/ACT inhaler [id number] Yes Inhale 1-2 puffs into the lungs every 6 (six) hours as needed. Parrett, Virgel Bouquet, NP Taking Active Self  amLODipine (NORVASC) 5 MG tablet [id number] Yes Take 1 tablet (5 mg total) by mouth daily. Loyola Mast, MD Taking Active   Biotin 52841 MCG TABS [id number] Yes Take 10,000 mcg by mouth daily. [provider] Taking Active Self  buPROPion (WELLBUTRIN) 75 MG tablet [id number] Yes Take 1 tablet (75 mg total) by mouth daily. Loyola Mast, MD Taking Active   calcium carbonate (OSCAL) 1500 (600 Ca) MG TABS tablet [id number] Yes Take 600 mg of elemental calcium by mouth daily with breakfast. [provider] Taking Active Self  Carboxymethylcellul-Glycerin (LUBRICATING EYE DROPS OP) [id number] Yes Place 1 drop into both eyes daily as needed (dry eyes). Visine [provider] Taking Active Self  cyclobenzaprine (FLEXERIL) 10 MG tablet [id number] Yes Take 1 tablet (10 mg total) by mouth 3 (three) times daily as needed  for muscle spasms. Shuford, Queets, PA-C Taking Active   Docusate Calcium (STOOL SOFTENER PO) [id number] Yes Take 1 tablet by mouth daily. [provider] Taking Active Self  meloxicam (MOBIC) 15 MG tablet [id number] Yes Take 1 tablet (15 mg total) by mouth daily as needed for pain. Kathryne Hitch, MD Taking Active   polyethylene glycol [facility] / GLYCOLAX) 17 g packet [id number] Yes Take 17 g by mouth daily. Takes an extra scoop in the evening as needed for moderate-severe constipation. [provider] Taking Active Self  pyridOXINE (VITAMIN B-6) 100 MG tablet [id number] Yes Take 100 mg by mouth daily. [provider] Taking Active Self  saccharomyces boulardii (FLORASTOR) 250 MG capsule [id number] Yes Take 2 capsules (500 mg total) by mouth 2 (two) times daily.  Patient taking differently: Take 250 mg by mouth daily.   Esterwood, Amy S, PA-C Taking Active Self  triamcinolone (NASACORT ALLERGY 24HR) 55 MCG/ACT AERO nasal inhaler [id number] Yes Place 1 spray into the nose daily. [provider] Taking Active Self  vitamin C (ASCORBIC ACID) 500 MG tablet [id number] Yes Take 500 mg by mouth daily. [provider] Taking Active Self  Vitamin D, Ergocalciferol, (DRISDOL) 1.25 MG (50000 UNIT) CAPS capsule [id number] Yes Take 1 capsule (50,000 Units total) by mouth every 30 (thirty) days. Loyola Mast, MD Taking Active             SDOH:  (Social Determinants of Health) assessments and interventions performed: Yes SDOH Interventions    Flowsheet Row Office Visit from 02/19/2023 in [facility] Clinical Support from 10/10/2022 in [facility] Chronic Care Management from 10/13/2021 in [facility]  [facility] Clinical Support from 09/20/2021 in [facility]  SDOH Interventions      Food Insecurity Interventions -- Intervention Not Indicated -- Intervention Not Indicated  Housing Interventions -- Intervention Not Indicated -- Intervention Not Indicated  Transportation Interventions -- Intervention Not Indicated -- Intervention Not Indicated  Depression Interventions/Treatment  Currently on Treatment -- -- --  Financial Strain Interventions -- Intervention Not Indicated Intervention Not Indicated Intervention Not Indicated  Physical Activity Interventions -- Intervention Not Indicated -- Intervention Not Indicated  Stress Interventions -- Intervention Not Indicated -- Intervention Not Indicated  Social Connections Interventions -- Intervention Not Indicated -- Intervention Not Indicated       Medication Assistance: None required.  Patient affirms current coverage meets needs.  Medication Access: Within the past 30 days, how often has patient missed a dose of medication? None Is a pillbox or other method used to improve adherence? Yes  Factors that may affect medication adherence? no barriers identified Are meds synced by current pharmacy? No  Are meds delivered by current pharmacy? No  Does patient experience delays in picking up medications due to transportation concerns? No   [facility] Services Reviewed: Is patient disadvantaged to use [facility]?: No  Current Rx insurance plan: [facility] Name and location of Current pharmacy:  [facility] [address], [place] - [address] [place] [address] Phone: [phone] Fax: [phone]  [facility] [address] [place] [address] Phone: [phone] Fax: [phone]  [facility] services reviewed with patient today?: No  Patient requests to transfer care to [facility]?: No  Reason patient  declined to change pharmacies: Loyalty to other pharmacy/Patient preference  Compliance/Adherence/Medication fill history:  Care Gaps: None noted  Star-Rating Drugs: None noted   Assessment/Plan  Hypertension (BP goal <140/90) -Controlled -Current treatment: Amlodipine 5 mg daily  -Medications previously tried: Lisinopril (Cough)   -Current home readings: 140s/85, highest in recall was 157/90 -Current dietary habits: 3-4 cups of water daily, 1 diet coke daily. 2 cups of coffee in the mornings. Trying to eat less.  -Current exercise habits: Walking 3 times weekly 45 minutes  -Reports/hypertensive symptoms -Recommended to continue current medication  Hyperlipidemia: (LDL goal < 70) -Uncontrolled -History of aortic atherosclerosis  -Current treatment: Ezetimibe 10 mg daily   -Medications previously tried: Rosuvastatin -Patient reports she is tolerating Ezetimibe well so far. She denies worsening muscle aches or cramping.   -Recommended to continue current medication  Depression / Anxiety (Goal: Achieve symptom remission) -Not ideally controlled -Current treatment: Bupropion 75 mg daily  -Medications previously tried/failed: NA -PHQ9: 10 -GAD7: 4 -Patient reports worsening depression symptoms, primarily fatigue and anhedonia. She has support within her family, but declines counseling at this visit. She was interested in adjusting her medication regimen to help with her symptoms.  -CHANGE bupropion to bupropion XL 150 mg daily.  -HC PHQ9 assessment in 4 weeks.   Osteopenia (Goal Prevent fractures) -Uncontrolled -Patient is a candidate for pharmacologic treatment due to T-Score -1.0 to -2.5 and 10-year risk of hip fracture > 3% -Current treatment  Calcium Carbonate 600 mg daily  Ergocalciferol 50,000 units monthly -Medications previously tried: NA   IBS (Goal: Improve symptoms) -Controlled -Current treatment  Florastor 250 mg 2 caps twice daily  -Medications previously  tried: Linzess (cost)  -Recommended to continue current medication  Osteoarthritis (Goal: Improve symptoms ) -Controlled -Current treatment  Acetaminophen 500 mg 1-2 tablets nightly.  Cyclobenzaprine 10 mg three times daily as needed Meloxicam 15 mg daily  -Medications previously tried: NA  -Recommended to continue current medication  Follow Up Plan: Telephone follow up appointment with care management team member scheduled for:  11/01/2023 at 8:30 AM  Angelena Sole, PharmD, Patsy Baltimore, CPP Clinical Pharmacist Practitioner  [facility]  [phone]

## 2023-06-04 ENCOUNTER — Telehealth: Payer: Self-pay

## 2023-06-04 NOTE — Progress Notes (Unsigned)
Care Management & Coordination Services Pharmacy Team  Reason for Encounter: General adherence update   Contacted patient for general health update and medication adherence call.   Spoke with patient on 06/04/2023    What concerns do you have about your medications? Patient denies any concerns regarding her medications.  The patient denies side effects with their medications.   How often do you forget or accidentally miss a dose? Never  Do you use a pillbox? No  Are you having any problems getting your medications from your pharmacy? No  Has the cost of your medications been a concern? No  Since last visit with PharmD, no interventions have been made.   The patient has not had an ED visit since last contact.   The patient denies problems with their health.   Patient reports the following concerns or questions for Julious Payer, PharmD.  Patient states she has notice her Blood pressure has been elevated a few times this month and last month. Patient reports her vision becomes blurry which made her check her bp resulting it to be elevated ranging around 150-160/90's. Patient explains  this only happens once a week, and the rest of the week her blood pressure is normal. Patient is asking if she should schedule appointment with her PCP or cardiologist, or if the clinical pharmacist would  recommend increasing  her amlodipine. Notified Clinical pharmacist.  Per Clinical pharmacist, Ok for now I would recommend we increase how often she monitors her blood pressure. Can you ask her to check her blood pressure daily for the next week and record them, as well as to check them if she feels symptomatic? She should record her blood pressure and report them to you next week. That will help [name] determine what changes we should make to her blood pressure medications.    Counseled patient on: Access to carecoordination team for any cost, medication or pharmacy concerns.     04/26/2023    9:43 AM  02/19/2023    9:18 AM 01/18/2023    9:09 AM 10/10/2022    1:21 PM 02/17/2022    8:51 AM  Depression screen PHQ 2/9  Decreased Interest 2 1 0 0 0  Down, Depressed, Hopeless 1 1 0 0 0  PHQ - 2 Score 3 2 0 0 0  Altered sleeping 1 3     Tired, decreased energy 2 3     Change in appetite 2 3     Feeling bad or failure about yourself  0 0     Trouble concentrating 2 0     Moving slowly or fidgety/restless 0 0     Suicidal thoughts 0 0     PHQ-9 Score 10 11     Difficult doing work/chores Somewhat difficult Somewhat difficult       PHQ9 Screening  Not at all(0)  Several days  (1) More than half the days  (2) Nearly every day (3)\  1. Little interest or pleasure in doing things - 2  2. Feeling down, depressed, or hopeless - 2  3. Trouble falling or staying asleep, or sleeping too much -0  4. Feeling tired or having little energy - 2  5. Poor appetite or overeating -2  6. Feeling bad about yourself or that you are a failure or  have let yourself or your family down- 0   7. Trouble concentrating on things, such as reading the  newspaper or watching television- 2  8. Moving or speaking so slowly  that other people could  have noticed. Or the opposite being so fidgety or  restless that you have been moving around a lot more  than usual -0  9. Thoughts that you would be better off dead, or of  hurting yourself - 0   10. If you checked off any problems, how difficult have these problems made it for you to do  your work, take care of things at home, or get  along with other people?  - somewhat  Total = 10  Patient reports since the change with her bupropion she has felt slightly better, and reports her daughter notice she is in a better mood.Patient denies any concerns or side effects at this time.  Star Rating Drugs:  None ID   Care Gaps: None ID  Chart Updates:  Recent office visits:  None ID  Recent consult visits:  None ID  Hospital visits:  None in previous 6  months  Medications: Outpatient Encounter Medications as of 06/04/2023  Medication Sig   acetaminophen (TYLENOL) 500 MG tablet Take 1-2 tablets (500-1,000 mg total) by mouth at bedtime as needed for moderate pain.   albuterol (VENTOLIN HFA) 108 (90 Base) MCG/ACT inhaler Inhale 1-2 puffs into the lungs every 6 (six) hours as needed.   amLODipine (NORVASC) 5 MG tablet Take 1 tablet (5 mg total) by mouth daily.   Biotin 16109 MCG TABS Take 10,000 mcg by mouth daily.   buPROPion (WELLBUTRIN XL) 150 MG 24 hr tablet Take 1 tablet (150 mg total) by mouth every morning.   calcium carbonate (OSCAL) 1500 (600 Ca) MG TABS tablet Take 600 mg of elemental calcium by mouth daily with breakfast.   Carboxymethylcellul-Glycerin (LUBRICATING EYE DROPS OP) Place 1 drop into both eyes daily as needed (dry eyes). Visine   cyclobenzaprine (FLEXERIL) 10 MG tablet Take 1 tablet (10 mg total) by mouth 3 (three) times daily as needed for muscle spasms.   Docusate Calcium (STOOL SOFTENER PO) Take 1 tablet by mouth daily.   ezetimibe (ZETIA) 10 MG tablet Take 1 tablet (10 mg total) by mouth daily.   meloxicam (MOBIC) 15 MG tablet Take 1 tablet (15 mg total) by mouth daily as needed for pain.   polyethylene glycol (MIRALAX / GLYCOLAX) 17 g packet Take 17 g by mouth daily. Takes an extra scoop in the evening as needed for moderate-severe constipation.   pyridOXINE (VITAMIN B-6) 100 MG tablet Take 100 mg by mouth daily.   saccharomyces boulardii (FLORASTOR) 250 MG capsule Take 2 capsules (500 mg total) by mouth 2 (two) times daily. (Patient taking differently: Take 250 mg by mouth daily.)   triamcinolone (NASACORT ALLERGY 24HR) 55 MCG/ACT AERO nasal inhaler Place 1 spray into the nose daily.   vitamin C (ASCORBIC ACID) 500 MG tablet Take 500 mg by mouth daily.   Vitamin D, Ergocalciferol, (DRISDOL) 1.25 MG (50000 UNIT) CAPS capsule Take 1 capsule (50,000 Units total) by mouth every 7 (seven) days.   No facility-administered  encounter medications on file as of 06/04/2023.    Recent vitals BP Readings from Last 3 Encounters:  02/19/23 120/72  02/14/23 (!) 172/91  01/18/23 134/82   Pulse Readings from Last 3 Encounters:  02/19/23 82  02/14/23 83  01/18/23 82   Wt Readings from Last 3 Encounters:  02/26/23 150 lb (68 kg)  02/19/23 151 lb 6.4 oz (68.7 kg)  02/14/23 150 lb (68 kg)   BMI Readings from Last 3 Encounters:  02/26/23 29.29 kg/m  02/19/23  29.57 kg/m  02/14/23 29.29 kg/m    Recent lab results    Component Value Date/Time   NA 140 06/01/2022 1411   K 4.2 06/01/2022 1411   CL 104 06/01/2022 1411   CO2 28 06/01/2022 1411   GLUCOSE 130 (H) 06/01/2022 1411   BUN 15 06/01/2022 1411   CREATININE 0.85 06/01/2022 1411   CALCIUM 9.6 06/01/2022 1411    Lab Results  Component Value Date   CREATININE 0.85 06/01/2022   GFR 80.20 02/17/2022   GFRNONAA >60 06/01/2022   GFRAA >60 10/17/2018   Lab Results  Component Value Date/Time   HGBA1C 5.6 02/17/2022 09:23 AM    Lab Results  Component Value Date   CHOL 192 02/19/2023   HDL 44.20 02/19/2023   LDLCALC 121 (H) 02/19/2023   LDLDIRECT 84.0 08/03/2020   TRIG 131.0 02/19/2023   CHOLHDL 4 02/19/2023     Everlean Cherry Clinical Pharmacist Assistant [phone]

## 2023-06-21 DIAGNOSIS — Z471 Aftercare following joint replacement surgery: Secondary | ICD-10-CM | POA: Diagnosis not present

## 2023-06-21 DIAGNOSIS — Z96612 Presence of left artificial shoulder joint: Secondary | ICD-10-CM | POA: Diagnosis not present

## 2023-06-27 ENCOUNTER — Telehealth: Payer: Self-pay | Admitting: Family Medicine

## 2023-06-27 NOTE — Telephone Encounter (Addendum)
LVM for daughter/EC, Eunice Blase, to call the office.Checking to make sure pt is ok since she didn't answer for NT or for me when I attempted to call.

## 2023-06-27 NOTE — Telephone Encounter (Signed)
Called and spoke to patient's husband, Adela Glimpse, he states that he was at work and had left the house about an hour ago.  He will try to call her to check on her and will call us back if she is needing to be seen. Dm/cma

## 2023-06-27 NOTE — Telephone Encounter (Signed)
NT states they placed pt on hold and pt disconnected the call. Attempted several times to call the pt back without success. Will attempt to contact the pt.

## 2023-06-27 NOTE — Telephone Encounter (Signed)
LVM for pt to call the office to get reconnected with NT.

## 2023-06-27 NOTE — Telephone Encounter (Signed)
Caller Name: Mylasia Hedman Ph #: [phone] Chief Complaint: Pt has been experiencing dizziness at random times and when this happens her vision goes blurry.   This call was transferred to Nurse Triage/Access Nurse. This is for documentation purposes. No follow up required at this time.

## 2023-06-29 ENCOUNTER — Encounter: Payer: Self-pay | Admitting: Family Medicine

## 2023-06-29 ENCOUNTER — Ambulatory Visit (INDEPENDENT_AMBULATORY_CARE_PROVIDER_SITE_OTHER): Payer: Medicare Other | Admitting: Family Medicine

## 2023-06-29 VITALS — BP 130/86 | HR 80 | Temp 97.2°F | Ht 60.0 in | Wt 154.6 lb

## 2023-06-29 DIAGNOSIS — H532 Diplopia: Secondary | ICD-10-CM

## 2023-06-29 DIAGNOSIS — R42 Dizziness and giddiness: Secondary | ICD-10-CM | POA: Diagnosis not present

## 2023-06-29 DIAGNOSIS — R479 Unspecified speech disturbances: Secondary | ICD-10-CM | POA: Diagnosis not present

## 2023-06-29 LAB — COMPREHENSIVE METABOLIC PANEL
ALT: 15 U/L (ref 0–35)
AST: 24 U/L (ref 0–37)
Albumin: 4.4 g/dL (ref 3.5–5.2)
Alkaline Phosphatase: 53 U/L (ref 39–117)
BUN: 27 mg/dL — ABNORMAL HIGH (ref 6–23)
CO2: 25 mEq/L (ref 19–32)
Calcium: 10.1 mg/dL (ref 8.4–10.5)
Chloride: 104 mEq/L (ref 96–112)
Creatinine, Ser: 0.81 mg/dL (ref 0.40–1.20)
GFR: 68.96 mL/min (ref >60.00)
Glucose, Bld: 107 mg/dL — ABNORMAL HIGH (ref 70–99)
Potassium: 4.1 mEq/L (ref 3.5–5.1)
Sodium: 139 mEq/L (ref 135–145)
Total Bilirubin: 0.3 mg/dL (ref 0.2–1.2)
Total Protein: 6.9 g/dL (ref 6.0–8.3)

## 2023-06-29 LAB — CBC
HCT: 42.8 % (ref 36.0–46.0)
Hemoglobin: 13.6 g/dL (ref 12.0–15.0)
MCHC: 31.7 g/dL (ref 30.0–36.0)
MCV: 91 fl (ref 78.0–100.0)
Platelets: 223 10*3/uL (ref 150.0–400.0)
RBC: 4.71 Mil/uL (ref 3.87–5.11)
RDW: 13.6 % (ref 11.5–15.5)
WBC: 6.2 10*3/uL (ref 4.0–10.5)

## 2023-06-29 NOTE — Progress Notes (Signed)
[facility] [address] [place] [address] Dept: [phone] Dept Fax: [phone]  Office Visit  Subjective:    Patient ID: Gloria Werner, female    DOB: January 17, 1941, 82 y.o..   MRN: [id number]  Chief Complaint  Patient presents with   Dizziness    C/o having some dizziness/vision issues off/on x 3-4 months (once a week).  Also persistent cough.  Has been using cough drops.     History of Present Illness:  Patient is in today with persistent issue with periodic episodes of "dizziness" associated with double vision. The initial episode occurred while lying on the bed. Since then, she has had episodes while up and moving. She cannot relate these episodes to any particular movement, such as turning or bending. She notes the episodes last about 5 minutes. These occur about once a week. A recent episode 2 days ago, also was associated with difficulty with been able to say or think of words. She denies any weakness in either arms or legs. She has a history of tinnitus for quite some time, but this is not necessarily different. She has not noted any change in her hearing.  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   DDD (degenerative disc disease), lumbar 09/28/2019   Aortic atherosclerosis (HCC) 09/28/2019   Gastroesophageal reflux disease without esophagitis 02/04/2019   OSA (obstructive sleep apnea) 01/27/2019   Pulmonary nodule 01/27/2019   Allergic rhinitis 11/06/2018   Upper airway cough syndrome 09/24/2018   Primary osteoarthritis of first carpometacarpal joint of left hand 07/03/2018    Essential hypertension 06/03/2018   Anxiety and depression 01/17/2017   Vitamin D insufficiency 01/17/2017   Osteopenia 04/08/2015   Irritable bowel syndrome (IBS) 02/03/2008   Past Surgical History:  Procedure Laterality Date   ABDOMINAL HYSTERECTOMY  1988   secondary to prolapse   BLADDER SUSPENSION     A-P with Hyst   CATARACT EXTRACTION Bilateral    INSERTION OF MESH  10/16/2018   Procedure: INSERTION OF MESH;  Surgeon: Crist Fat, MD;  Location: [facility];  Service: Urology;;   NASAL SINUS SURGERY  1970   REVERSE SHOULDER ARTHROPLASTY Left 06/08/2022   Procedure: REVERSE SHOULDER ARTHROPLASTY;  Surgeon: Francena Hanly, MD;  Location: [facility];  Service: Orthopedics;  Laterality: Left;   ROBOTIC ASSISTED LAPAROSCOPIC SACROCOLPOPEXY N/A 10/16/2018   Procedure: XI ROBOTIC ASSISTED LAPAROSCOPIC SACROCOLPOPEXY;  Surgeon: Crist Fat, MD;  Location: [facility];  Service: Urology;  Laterality: N/A;   TONSILLECTOMY     TOTAL HIP ARTHROPLASTY Right 11/04/2021   Procedure: RIGHT TOTAL HIP ARTHROPLASTY ANTERIOR APPROACH;  Surgeon: Kathryne Hitch, MD;  Location: [facility];  Service: Orthopedics;  Laterality: Right;   Family History  Problem Relation Age of Onset   Emphysema Mother    Lymphoma Mother    Asthma Mother    Cancer Father 58       lung cancer   Coronary artery disease Brother    Cancer Brother        breast cancer   Breast cancer Brother    Cancer Brother        lung cancer   Diabetes Neg  Hx    Hypertension Neg Hx    Colon cancer Neg Hx    Esophageal cancer Neg Hx    Rectal cancer Neg Hx    Stomach cancer Neg Hx    Outpatient Medications Prior to Visit  Medication Sig Dispense Refill   acetaminophen (TYLENOL) 500 MG tablet Take 1-2 tablets (500-1,000 mg total) by mouth at bedtime as needed for moderate pain. 30 tablet 0   albuterol (VENTOLIN HFA) 108 (90 Base) MCG/ACT inhaler Inhale 1-2 puffs into the lungs every 6 (six) hours as needed. 8 g 2    amLODipine (NORVASC) 5 MG tablet Take 1 tablet (5 mg total) by mouth daily. 90 tablet 0   Biotin 69629 MCG TABS Take 10,000 mcg by mouth daily.     buPROPion (WELLBUTRIN XL) 150 MG 24 hr tablet Take 1 tablet (150 mg total) by mouth every morning. 90 tablet 1   calcium carbonate (OSCAL) 1500 (600 Ca) MG TABS tablet Take 600 mg of elemental calcium by mouth daily with breakfast.     Carboxymethylcellul-Glycerin (LUBRICATING EYE DROPS OP) Place 1 drop into both eyes daily as needed (dry eyes). Visine     Docusate Calcium (STOOL SOFTENER PO) Take 1 tablet by mouth daily.     ezetimibe (ZETIA) 10 MG tablet Take 1 tablet (10 mg total) by mouth daily. 90 tablet 3   polyethylene glycol (MIRALAX / GLYCOLAX) 17 g packet Take 17 g by mouth daily. Takes an extra scoop in the evening as needed for moderate-severe constipation.     pyridOXINE (VITAMIN B-6) 100 MG tablet Take 100 mg by mouth daily.     saccharomyces boulardii (FLORASTOR) 250 MG capsule Take 2 capsules (500 mg total) by mouth 2 (two) times daily. (Patient taking differently: Take 250 mg by mouth daily.) 120 capsule 3   triamcinolone (NASACORT ALLERGY 24HR) 55 MCG/ACT AERO nasal inhaler Place 1 spray into the nose daily.     vitamin C (ASCORBIC ACID) 500 MG tablet Take 500 mg by mouth daily.     Vitamin D, Ergocalciferol, (DRISDOL) 1.25 MG (50000 UNIT) CAPS capsule Take 1 capsule (50,000 Units total) by mouth every 7 (seven) days. 12 capsule 3   cyclobenzaprine (FLEXERIL) 10 MG tablet Take 1 tablet (10 mg total) by mouth 3 (three) times daily as needed for muscle spasms. (Patient not taking: Reported on 06/29/2023) 30 tablet 1   meloxicam (MOBIC) 15 MG tablet Take 1 tablet (15 mg total) by mouth daily as needed for pain. 30 tablet 6   prednisoLONE acetate (PRED FORTE) 1 % ophthalmic suspension INSTILL 1 DROP INTO LEFT EYE 4 TIMES DAILY     No facility-administered medications prior to visit.   Allergies  Allergen Reactions   Lexapro [Escitalopram]  Diarrhea and Other (See Comments)    headache   Ace Inhibitors Cough   Codeine Nausea And Vomiting     Objective:   Today's Vitals   06/29/23 1103  BP: 130/86  Pulse: 80  Temp: (!) 97.2 F (36.2 C)  TempSrc: Temporal  SpO2: 99%  Weight: 154 lb 9.6 oz (70.1 kg)  Height: 5' (1.524 m)   Body mass index is 30.19 kg/m.   General: Well developed, well nourished. No acute distress. HEENT: Normocephalic, non-traumatic. PERRL, EOMI. Conjunctiva clear. External ears normal. EAC and TMs normal bilaterally.   Nose clear without congestion or rhinorrhea. Mucous membranes moist. Oropharynx clear. Good dentition. Neck: Supple. No lymphadenopathy. No thyromegaly. No bruits. CV: RRR with a II/VI systolic murmur.  Pulses 2+ bilaterally. Neuro: CN II-XII grossly intact. Moves all extremities. Dix-Hallpike maneuver is normal. Psych: Alert and oriented. Normal mood and affect.  There are no preventive care reminders to display for this patient.    Assessment & Plan:  1. Dysequilibrium 2. Speech disturbance- temporary expressive aphasia 3. Diplopia  The etiology of these spells is uncertain. I will order some screening labs for potential underlying causes. I will check a CT scan of the brain to look for any sign of stroke. I will refer her to neurology for further evaluation.  - CBC - Comprehensive metabolic panel - CT HEAD WO CONTRAST ( ); Future - Ambulatory referral to Neurology  Return for Follow-up after testing/imaging.   Loyola Mast, MD

## 2023-07-05 ENCOUNTER — Encounter: Payer: Self-pay | Admitting: Neurology

## 2023-07-19 ENCOUNTER — Ambulatory Visit
Admission: RE | Admit: 2023-07-19 | Discharge: 2023-07-19 | Disposition: A | Discharge status: Home / Self Care | Payer: Medicare Other | Source: Ambulatory Visit | Attending: Family Medicine | Admitting: Family Medicine

## 2023-07-19 DIAGNOSIS — H532 Diplopia: Secondary | ICD-10-CM

## 2023-07-19 DIAGNOSIS — R4701 Aphasia: Secondary | ICD-10-CM | POA: Diagnosis not present

## 2023-07-19 DIAGNOSIS — R42 Dizziness and giddiness: Secondary | ICD-10-CM

## 2023-07-19 DIAGNOSIS — R479 Unspecified speech disturbances: Secondary | ICD-10-CM

## 2023-08-06 ENCOUNTER — Encounter: Payer: Self-pay | Admitting: Neurology

## 2023-08-06 ENCOUNTER — Other Ambulatory Visit (INDEPENDENT_AMBULATORY_CARE_PROVIDER_SITE_OTHER): Payer: Medicare Other

## 2023-08-06 ENCOUNTER — Ambulatory Visit: Payer: Medicare Other | Admitting: Neurology

## 2023-08-06 VITALS — BP 151/89 | HR 75 | Ht 60.0 in | Wt 155.0 lb

## 2023-08-06 DIAGNOSIS — H538 Other visual disturbances: Secondary | ICD-10-CM

## 2023-08-06 DIAGNOSIS — H532 Diplopia: Secondary | ICD-10-CM

## 2023-08-06 DIAGNOSIS — R42 Dizziness and giddiness: Secondary | ICD-10-CM

## 2023-08-06 LAB — B12 AND FOLATE PANEL
Folate: 16.9 ng/mL (ref >5.9)
Vitamin B-12: 249 pg/mL (ref 211–911)

## 2023-08-06 LAB — TSH: TSH: 1.65 u[IU]/mL (ref 0.35–5.50)

## 2023-08-06 NOTE — Progress Notes (Signed)
[facility] Clinic Note - Initial Visit   Date: 08/06/2023   [name] MRN: [id number] DOB: 10-Mar-1941   Dear Dr. Veto Kemps:  Thank you for your kind referral of Gloria Werner for consultation of dizziness and double vision. Although her history is well known to you, please allow [name] to reiterate it for the purpose of our medical record. The patient was accompanied to the clinic by husband who also provides collateral information.     [name] is a 82 y.o. right-handed female with GERD, CAD, hypertension, hyperlipidemia, and chronic cough presenting for evaluation of dizziness and double vision.   IMPRESSION/PLAN: Intermittent lightheadedness.   Intermittent diplopia involving the right eye  Orthostatic vital signs are negative.  CT head was negative. Neurological exam does not suggest vertigo or sensory ataxia.    Etiology of symptoms remains unclear.  I will check MRI brain wo contrast MRA head and neck to be complete.  Check vitamin B12, folate, TSH Given that her double vision is worse with left eye closure, I want her to see her ophthalmologist.  Neuromuscular ocular weakness, such as myasthenia, would resolve with eye closure.  Further recommendations pending results.   ------------------------------------------------------------- History of present illness: For the past 4-5 months, she reports having spells of lightheadedness which is triggered by positional changes, such as when standing or turning. She specifically denies spinning sensation.  It lasts 10-15 seconds and self resolved.  She also has double vision in the right eye which occurs in spells.  This can occur at anytime, without any triggers.  She reports that her double vision is worse if she closes the left eye.  No associated headaches. CT head on 7/26 was unremarkable.     She is a nonsmoker and does not drink alcohol. She previously worked for [employer].   She lives at home with husband.    Out-side paper records, electronic medical record, and images have been reviewed where available and summarized as:  Lab Results  Component Value Date   HGBA1C 5.6 02/17/2022   Lab Results  Component Value Date   VITAMINB12 454 04/08/2015   Lab Results  Component Value Date   TSH 1.64 09/29/2019    Past Medical History:  Diagnosis Date   Allergic rhinitis due to pollen    Cancer [facility])    skin   Cataract    NS OU   Complication of anesthesia    Coronary artery disease    Cough    De Quervain's syndrome (tenosynovitis) 07/03/2018   Gastroesophageal reflux disease without esophagitis    Heart murmur    Hypertension    Irritable bowel syndrome    Lichen simplex chronicus 09/2004   PONV (postoperative nausea and vomiting)    Unilateral primary osteoarthritis, 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    Past Surgical History:  Procedure Laterality Date   ABDOMINAL HYSTERECTOMY  1988   secondary to prolapse   BLADDER SUSPENSION     A-P with Hyst   CATARACT EXTRACTION Bilateral    INSERTION OF MESH  10/16/2018   Procedure: INSERTION OF MESH;  Surgeon: Crist Fat, MD;  Location: [facility];  Service: Urology;;   NASAL SINUS SURGERY  1970   REVERSE SHOULDER ARTHROPLASTY Left 06/08/2022   Procedure: REVERSE SHOULDER ARTHROPLASTY;  Surgeon: Francena Hanly, MD;  Location: [facility];  Service: Orthopedics;  Laterality: Left;   ROBOTIC ASSISTED LAPAROSCOPIC SACROCOLPOPEXY N/A 10/16/2018   Procedure: XI ROBOTIC ASSISTED LAPAROSCOPIC SACROCOLPOPEXY;  Surgeon: Marlou Porch,  Earle Gell, MD;  Location: [facility];  Service: Urology;  Laterality: N/A;   TONSILLECTOMY     TOTAL HIP ARTHROPLASTY Right 11/04/2021   Procedure: RIGHT TOTAL HIP ARTHROPLASTY ANTERIOR APPROACH;  Surgeon: Kathryne Hitch, MD;  Location: [facility];  Service: Orthopedics;  Laterality: Right;     Medications:  Outpatient Encounter Medications as of 08/06/2023  Medication Sig   acetaminophen  (TYLENOL) 500 MG tablet Take 1-2 tablets (500-1,000 mg total) by mouth at bedtime as needed for moderate pain.   albuterol (VENTOLIN HFA) 108 (90 Base) MCG/ACT inhaler Inhale 1-2 puffs into the lungs every 6 (six) hours as needed.   amLODipine (NORVASC) 5 MG tablet Take 1 tablet (5 mg total) by mouth daily.   Biotin 09811 MCG TABS Take 10,000 mcg by mouth daily.   buPROPion (WELLBUTRIN XL) 150 MG 24 hr tablet Take 1 tablet (150 mg total) by mouth every morning.   calcium carbonate (OSCAL) 1500 (600 Ca) MG TABS tablet Take 600 mg of elemental calcium by mouth daily with breakfast.   Carboxymethylcellul-Glycerin (LUBRICATING EYE DROPS OP) Place 1 drop into both eyes daily as needed (dry eyes). Visine   Docusate Calcium (STOOL SOFTENER PO) Take 1 tablet by mouth daily.   ezetimibe (ZETIA) 10 MG tablet Take 1 tablet (10 mg total) by mouth daily.   polyethylene glycol (MIRALAX / GLYCOLAX) 17 g packet Take 17 g by mouth daily. Takes an extra scoop in the evening as needed for moderate-severe constipation.   pyridOXINE (VITAMIN B-6) 100 MG tablet Take 100 mg by mouth daily.   saccharomyces boulardii (FLORASTOR) 250 MG capsule Take 2 capsules (500 mg total) by mouth 2 (two) times daily. (Patient taking differently: Take 250 mg by mouth daily.)   triamcinolone (NASACORT ALLERGY 24HR) 55 MCG/ACT AERO nasal inhaler Place 1 spray into the nose daily.   vitamin C (ASCORBIC ACID) 500 MG tablet Take 500 mg by mouth daily.   Vitamin D, Ergocalciferol, (DRISDOL) 1.25 MG (50000 UNIT) CAPS capsule Take 1 capsule (50,000 Units total) by mouth every 7 (seven) days.   cyclobenzaprine (FLEXERIL) 10 MG tablet Take 1 tablet (10 mg total) by mouth 3 (three) times daily as needed for muscle spasms. (Patient not taking: Reported on 06/29/2023)   No facility-administered encounter medications on file as of 08/06/2023.    Allergies:  Allergies  Allergen Reactions   Lexapro [Escitalopram] Diarrhea and Other (See Comments)     headache   Ace Inhibitors Cough   Codeine Nausea And Vomiting    Family History: Family History  Problem Relation Age of Onset   Emphysema Mother    Lymphoma Mother    Asthma Mother    Cancer Father 23       lung cancer   Coronary artery disease Brother    Cancer Brother        breast cancer   Breast cancer Brother    Cancer Brother        lung cancer   Diabetes Neg Hx    Hypertension Neg Hx    Colon cancer Neg Hx    Esophageal cancer Neg Hx    Rectal cancer Neg Hx    Stomach cancer Neg Hx     Social History: Social History   Tobacco Use   Smoking status: Never   Smokeless tobacco: Never  Vaping Use   Vaping status: Never Used  Substance Use Topics   Alcohol use: No   Drug use: No   Social History  Social History Narrative   HSG. Married '63. 1 son- 69'; 1-daughter-'68; grandchildren 3.  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 Provided lead to http://bridges.com/.            Are you right handed or left handed? Right Handed. Uses Left hand as well   Are you currently employed ? No    What is your current occupation?   Do you live at home alone? No    Who lives with you? With husband    What type of home do you live in: 1 story or 2 story? One story home         Vital Signs:  BP (!) 151/89   Pulse 75   Ht 5' (1.524 m)   Wt 155 lb (70.3 kg)   LMP 12/25/1986 (Approximate)   SpO2 98%   BMI 30.27 kg/m    No data found.  Neurological Exam: MENTAL STATUS including orientation to time, place, person, recent and remote memory, attention span and concentration, language, and fund of knowledge is normal.  Speech is not dysarthric.  CRANIAL NERVES: II:  No visual field defects.     III-IV-VI: Pupils equal round and reactive to light.  Normal conjugate, extra-ocular eye movements in all directions of gaze.  No nystagmus.  Mild right ptosis (old, followed cataract surgery) V:  Normal facial sensation.    VII:  Normal  facial symmetry and movements.   VIII:  Normal hearing and vestibular function.   IX-X:  Normal palatal movement.   XI:  Normal shoulder shrug and head rotation.   XII:  Normal tongue strength and range of motion, no deviation or fasciculation.  MOTOR:  Motor strength is 5/5 throughout. No atrophy, fasciculations or abnormal movements.  No pronator drift.   MSRs: Reflexes are 1+/4 throughout.  Plantars are down-going.  SENSORY:  Normal and symmetric perception of light touch, vibration, and temperature.  Romberg's sign absent.   COORDINATION/GAIT: Normal finger-to- nose-finger.  Intact rapid alternating movements bilaterally.  Able to rise from a chair without using arms.  Gait is mildly unsteady, unassisted.     Thank you for allowing me to participate in patient's care.  If I can answer any additional questions, I would be pleased to do so.    Sincerely,     K. Allena Katz, DO

## 2023-08-06 NOTE — Patient Instructions (Signed)
MRI brain and vessel imaging  Check labs  Recommend that you follow-up with your eye specialist for right eye double vision

## 2023-08-13 NOTE — Progress Notes (Unsigned)
Cardiology Office Note:  .   Date:  08/14/2023  ID:  Gloria, Werner May 20, 1941, MRN [id number] PCP: Gloria Mast, MD  [facility] Providers Cardiologist:  Charlton Haws, MD {   History of Present Illness: .   Gloria Werner is a 82 y.o. female with a past medical history of no cardiac disease, chronic cough, non-smoker, HTN, HLD, chronic edema, GERD, plantar fasciitis (left foot), arthritis who is being seen for follow-up appointment.  History includes previously being evaluated for murmur.  Echo showed normal LVEF and mild MR.  He was seen by APP 09/2020.  Last seen by Gloria Aus, PA-C 08/10/2022.  She presented for follow-up at that time.  Had a history of chronic cough.  Evaluated by pulmonary without evidence of interstitial lung disease.  Felt upper airway was reactive.  Denies chest pain, shortness of breath, orthopnea, PND, syncope, lower extremity edema or melena.  At home blood pressure runs 140s over 80s.  She did not want to increase her medication.  Plan to follow-up with PCP.  Mild intermittent ankle edema.  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  Was recovering well.  She has double vision in her one eye, had a head scan. Went to the neurologist last week.   Today, she presents with her husband.  She states that she has had this chronic cough for 3 to 4 years.  Nobody has been able to figure out what is going on.  Blood pressure is too high today at 150/100.  On repeat was 160/100.  She does have a blood pressure cuff at home but I advised her to get a new one since several years old.  She is taking her amlodipine 5 mg daily but does struggle with unilateral lower extremity swelling at the end of the day.  For this reason we did not increase her to 10 mg today but added a new medication.  We did discuss her dizziness.  She tells me is not vertigo.  She does get double vision with the dizziness.  She had a brain scan  recently and she is set up for an MRA of her head and neck which will take place next month.  Does not seem particularly cardiac in nature but will follow testing.  Reports no shortness of breath nor dyspnea on exertion. Reports no chest pain, pressure, or tightness. No edema, orthopnea, PND. Reports no palpitations.   ROS: Pertinent ROS in HPI  Studies Reviewed: Marland Kitchen   EKG Interpretation Date/Time:  Tuesday August 14 2023 08:09:00 EDT Ventricular Rate:  78 PR Interval:  136 QRS Duration:  86 QT Interval:  396 QTC Calculation: 451 R Axis:   -46  Text Interpretation: Normal sinus rhythm Left anterior fascicular block When compared with ECG of 31-Oct-2021 08:16, No significant change was found Confirmed by Jari Favre [phone]) on 08/14/2023 8:34:27 AM   Echocardiogram 08/24/2022 IMPRESSIONS     1. Left ventricular ejection fraction, by estimation, is 60 to 65%. Left  ventricular ejection fraction by PLAX is 60 %. The left ventricle has  normal function. The left ventricle has no regional wall motion  abnormalities. There is moderate left  ventricular hypertrophy. Left ventricular diastolic parameters are  consistent with Grade I diastolic dysfunction (impaired relaxation).   2. Right ventricular systolic function is normal. The right ventricular  size is normal. There is normal pulmonary artery systolic pressure. The  estimated right ventricular systolic pressure is 30.9 mmHg.  3. Left atrial size was mildly dilated.   4. The mitral valve is abnormal. Trivial mitral valve regurgitation.  Moderate mitral annular calcification.   5. The aortic valve is tricuspid. Aortic valve regurgitation is not  visualized. Aortic valve sclerosis/calcification is present, without any  evidence of aortic stenosis.   6. The inferior vena cava is normal in size with greater than 50%  respiratory variability, suggesting right atrial pressure of 3 mmHg.   Comparison(s): Changes from prior study are noted.  07/21/2019: LVEF 60-65%.   FINDINGS   Left Ventricle: Left ventricular ejection fraction, by estimation, is 60  to 65%. Left ventricular ejection fraction by PLAX is 60 %. The left  ventricle has normal function. The left ventricle has no regional wall  motion abnormalities. The left  ventricular internal cavity size was normal in size. There is moderate  left ventricular hypertrophy. Left ventricular diastolic parameters are  consistent with Grade I diastolic dysfunction (impaired relaxation).  Indeterminate filling pressures.   Right Ventricle: The right ventricular size is normal. No increase in  right ventricular wall thickness. Right ventricular systolic function is  normal. There is normal pulmonary artery systolic pressure. The tricuspid  regurgitant velocity is 2.64 m/s, and   with an assumed right atrial pressure of 3 mmHg, the estimated right  ventricular systolic pressure is 30.9 mmHg.   Left Atrium: Left atrial size was mildly dilated.   Right Atrium: Right atrial size was normal in size.   Pericardium: There is no evidence of pericardial effusion.   Mitral Valve: The mitral valve is abnormal. Moderate mitral annular  calcification. Trivial mitral valve regurgitation.   Tricuspid Valve: The tricuspid valve is grossly normal. Tricuspid valve  regurgitation is mild.   Aortic Valve: The aortic valve is tricuspid. Aortic valve regurgitation is  not visualized. Aortic valve sclerosis/calcification is present, without  any evidence of aortic stenosis.   Pulmonic Valve: The pulmonic valve was grossly normal. Pulmonic valve  regurgitation is not visualized.   Aorta: The aortic root and ascending aorta are structurally normal, with  no evidence of dilitation.   Venous: The inferior vena cava is normal in size with greater than 50%  respiratory variability, suggesting right atrial pressure of 3 mmHg.   IAS/Shunts: No atrial level shunt detected by color flow Doppler.    Physical Exam:   VS:  BP (!) 150/100   Pulse 78   Ht 4\' 11"  (1.499 m)   Wt 156 lb (70.8 kg)   LMP 12/25/1986 (Approximate)   SpO2 97%   BMI 31.51 kg/m    Wt Readings from Last 3 Encounters:  08/14/23 156 lb (70.8 kg)  08/06/23 155 lb (70.3 kg)  06/29/23 154 lb 9.6 oz (70.1 kg)    GEN: Well nourished, well developed in no acute distress NECK: No JVD; No carotid bruits CARDIAC: RRR, + systolic murmur, rubs, gallops RESPIRATORY:  Clear to auscultation without rales, wheezing or rhonchi  ABDOMEN: Soft, non-tender, non-distended EXTREMITIES:  No edema; No deformity   ASSESSMENT AND PLAN: .   1.  Hypertension -losartan 25mg  added today -keep a log 2 weeks of her blood pressures and send me those results -Will likely need further titration of blood pressure medications -Continue on amlodipine 5 mg daily -Continue low-sodium, heart healthy diet  2.  Murmur/mild MR -Update echocardiogram -Continue current medication regimen -Not particularly symptomatic at this time  3.  Chronic cough -Lungs are clear to auscultation today on exam -She has not had a chest  x-ray or CT scan of her chest in a long time -Would recommend further workup through PCP   4.  HLD -Recent LDL was 121 back in February -We have ordered a repeat lipid panel, recently started on Zetia -She will have her labs next week and we will wait and if any medication changes need to be made.  5. Dizziness -repeat echo -Does not seem particularly cardiac in nature, occurs randomly and is not positional -She is currently scheduled to have an MRI of her head and neck through her primary for further investigation      Dispo: She can come back in a year to see Dr. Eden Emms  Signed, Sharlene Dory, PA-C

## 2023-08-14 ENCOUNTER — Ambulatory Visit: Payer: Medicare Other | Attending: Physician Assistant | Admitting: Physician Assistant

## 2023-08-14 ENCOUNTER — Encounter: Payer: Self-pay | Admitting: Physician Assistant

## 2023-08-14 VITALS — BP 150/100 | HR 78 | Ht 59.0 in | Wt 156.0 lb

## 2023-08-14 DIAGNOSIS — R011 Cardiac murmur, unspecified: Secondary | ICD-10-CM

## 2023-08-14 DIAGNOSIS — E7800 Pure hypercholesterolemia, unspecified: Secondary | ICD-10-CM

## 2023-08-14 DIAGNOSIS — I10 Essential (primary) hypertension: Secondary | ICD-10-CM | POA: Diagnosis not present

## 2023-08-14 DIAGNOSIS — I340 Nonrheumatic mitral (valve) insufficiency: Secondary | ICD-10-CM

## 2023-08-14 DIAGNOSIS — R053 Chronic cough: Secondary | ICD-10-CM

## 2023-08-14 DIAGNOSIS — R42 Dizziness and giddiness: Secondary | ICD-10-CM

## 2023-08-14 MED ORDER — LOSARTAN POTASSIUM 25 MG PO TABS: 25.0000 mg | ORAL_TABLET | Freq: Every day | ORAL | 3 refills | Status: DC

## 2023-08-14 MED ORDER — AMLODIPINE BESYLATE 5 MG PO TABS: 5.0000 mg | ORAL_TABLET | Freq: Every day | ORAL | 3 refills | Status: DC

## 2023-08-14 NOTE — Patient Instructions (Addendum)
Medication Instructions:   START TAKING LOSARTAN 25 MG ONCE A DAY   *If you need a refill on your cardiac medications before your next appointment, please call your pharmacy*   Lab Work:  RETURN FASTING FOR LABS BMET  LFT  LIPIDS   If you have labs (blood work) drawn today and your tests are completely normal, you will receive your results only by: MyChart Message (if you have MyChart) OR A paper copy in the mail If you have any lab test that is abnormal or we need to change your treatment, we will call you to review the results.   Testing/Procedures: Your physician has requested that you have an echocardiogram. Echocardiography is a painless test that uses sound waves to create images of your heart. It provides your doctor with information about the size and shape of your heart and how well your heart's chambers and valves are working. This procedure takes approximately one hour. There are no restrictions for this procedure. Please do NOT wear cologne, perfume, aftershave, or lotions (deodorant is allowed). Please arrive 15 minutes prior to your appointment time.    Follow-Up: At [facility], you and your health needs are our priority.  As part of our continuing mission to provide you with exceptional heart care, we have created designated Provider Care Teams.  These Care Teams include your primary Cardiologist (physician) and Advanced Practice Providers (APPs -  Physician Assistants and Nurse Practitioners) who all work together to provide you with the care you need, when you need it.  We recommend signing up for the patient portal called "MyChart".  Sign up information is provided on this After Visit Summary.  MyChart is used to connect with patients for Virtual Visits (Telemedicine).  Patients are able to view lab/test results, encounter notes, upcoming appointments, etc.  Non-urgent messages can be sent to your provider as well.   To learn more about what you can do with  MyChart, go to [email or web address].    Your next appointment:   1 year(s)  Provider:   Charlton Haws, MD     Other Instructions  Low-Sodium Eating Plan Salt (sodium) helps you keep a healthy balance of fluids in your body. Too much sodium can raise your blood pressure. It can also cause fluid and waste to be held in your body. Your health care provider or dietitian may recommend a low-sodium eating plan if you have high blood pressure (hypertension), kidney disease, liver disease, or heart failure. Eating less sodium can help lower your blood pressure and reduce swelling. It can also protect your heart, liver, and kidneys. What are tips for following this plan? Reading food labels  Check food labels for the amount of sodium per serving. If you eat more than one serving, you must multiply the listed amount by the number of servings. Choose foods with less than 140 milligrams (mg) of sodium per serving. Avoid foods with 300 mg of sodium or more per serving. Always check how much sodium is in a product, even if the label says "unsalted" or "no salt added." Shopping  Buy products labeled as "low-sodium" or "no salt added." Buy fresh foods. Avoid canned foods and pre-made or frozen meals. Avoid canned, cured, or processed meats. Buy breads that have less than 80 mg of sodium per slice. Cooking  Eat more home-cooked food. Try to eat less restaurant, buffet, and fast food. Try not to add salt when you cook. Use salt-free seasonings or herbs instead of table salt or  sea salt. Check with your provider or pharmacist before using salt substitutes. Cook with plant-based oils, such as canola, sunflower, or olive oil. Meal planning When eating at a restaurant, ask if your food can be made with less salt or no salt. Avoid dishes labeled as brined, pickled, cured, or smoked. Avoid dishes made with soy sauce, miso, or teriyaki sauce. Avoid foods that have monosodium glutamate (MSG) in them. MSG  may be added to some restaurant food, sauces, soups, bouillon, and canned foods. Make meals that can be grilled, baked, poached, roasted, or steamed. These are often made with less sodium. General information Try to limit your sodium intake to 1,500-2,300 mg each day, or the amount told by your provider. What foods should I eat? Fruits Fresh, frozen, or canned fruit. Fruit juice. Vegetables Fresh or frozen vegetables. "No salt added" canned vegetables. "No salt added" tomato sauce and paste. Low-sodium or reduced-sodium tomato and vegetable juice. Grains Low-sodium cereals, such as oats, puffed wheat and rice, and shredded wheat. Low-sodium crackers. Unsalted rice. Unsalted pasta. Low-sodium bread. Whole grain breads and whole grain pasta. Meats and other proteins Fresh or frozen meat, poultry, seafood, and fish. These should have no added salt. Low-sodium canned tuna and salmon. Unsalted nuts. Dried peas, beans, and lentils without added salt. Unsalted canned beans. Eggs. Unsalted nut butters. Dairy Milk. Soy milk. Cheese that is naturally low in sodium, such as ricotta cheese, fresh mozzarella, or Swiss cheese. Low-sodium or reduced-sodium cheese. Cream cheese. Yogurt. Seasonings and condiments Fresh and dried herbs and spices. Salt-free seasonings. Low-sodium mustard and ketchup. Sodium-free salad dressing. Sodium-free light mayonnaise. Fresh or refrigerated horseradish. Lemon juice. Vinegar. Other foods Homemade, reduced-sodium, or low-sodium soups. Unsalted popcorn and pretzels. Low-salt or salt-free chips. The items listed above may not be all the foods and drinks you can have. Talk to a dietitian to learn more. What foods should I avoid? Vegetables Sauerkraut, pickled vegetables, and relishes. Olives. [place] fries. Onion rings. Regular canned vegetables, except low-sodium or reduced-sodium items. Regular canned tomato sauce and paste. Regular tomato and vegetable juice. Frozen vegetables  in sauces. Grains Instant hot cereals. Bread stuffing, pancake, and biscuit mixes. Croutons. Seasoned rice or pasta mixes. Noodle soup cups. Boxed or frozen macaroni and cheese. Regular salted crackers. Self-rising flour. Meats and other proteins Meat or fish that is salted, canned, smoked, spiced, or pickled. Precooked or cured meat, such as sausages or meat loaves. Tomasa Blase. Ham. Pepperoni. Hot dogs. Corned beef. Chipped beef. Salt pork. Jerky. Pickled herring, anchovies, and sardines. Regular canned tuna. Salted nuts. Dairy Processed cheese and cheese spreads. Hard cheeses. Cheese curds. Blue cheese. Feta cheese. String cheese. Regular cottage cheese. Buttermilk. Canned milk. Fats and oils Salted butter. Regular margarine. Ghee. Bacon fat. Seasonings and condiments Onion salt, garlic salt, seasoned salt, table salt, and sea salt. Canned and packaged gravies. Worcestershire sauce. Tartar sauce. Barbecue sauce. Teriyaki sauce. Soy sauce, including reduced-sodium soy sauce. Steak sauce. Fish sauce. Oyster sauce. Cocktail sauce. Horseradish that you find on the shelf. Regular ketchup and mustard. Meat flavorings and tenderizers. Bouillon cubes. Hot sauce. Pre-made or packaged marinades. Pre-made or packaged taco seasonings. Relishes. Regular salad dressings. Salsa. Other foods Salted popcorn and pretzels. Corn chips and puffs. Potato and tortilla chips. Canned or dried soups. Pizza. Frozen entrees and pot pies. The items listed above may not be all the foods and drinks you should avoid. Talk to a dietitian to learn more. This information is not intended to replace advice given to you  by your health care provider. Make sure you discuss any questions you have with your health care provider. Document Revised: 12/28/2022 Document Reviewed: 12/28/2022 Elsevier Patient Education  2024 [employer]  Heart-Healthy Eating Plan Eating a healthy diet is important for the health of your heart. A heart-healthy  eating plan includes: Eating less unhealthy fats. Eating more healthy fats. Eating less salt in your food. Salt is also called sodium. Making other changes in your diet. Talk with your doctor or a diet specialist (dietitian) to create an eating plan that is right for you. What is my plan? Your doctor may recommend an eating plan that includes: Total fat: ______% or less of total calories a day. Saturated fat: ______% or less of total calories a day. Cholesterol: less than _________mg a day. Sodium: less than _________mg a day. What are tips for following this plan? Cooking Avoid frying your food. Try to bake, boil, grill, or broil it instead. You can also reduce fat by: Removing the skin from poultry. Removing all visible fats from meats. Steaming vegetables in water or broth. Meal planning  At meals, divide your plate into four equal parts: Fill one-half of your plate with vegetables and green salads. Fill one-fourth of your plate with whole grains. Fill one-fourth of your plate with lean protein foods. Eat 2-4 cups of vegetables per day. One cup of vegetables is: 1 cup (91 g) broccoli or cauliflower florets. 2 medium carrots. 1 large bell pepper. 1 large sweet potato. 1 large tomato. 1 medium white potato. 2 cups (150 g) raw leafy greens. Eat 1-2 cups of fruit per day. One cup of fruit is: 1 small apple 1 large banana 1 cup (237 g) mixed fruit, 1 large orange,  cup (82 g) dried fruit, 1 cup (240 mL) 100% fruit juice. Eat more foods that have soluble fiber. These are apples, broccoli, carrots, beans, peas, and barley. Try to get 20-30 g of fiber per day. Eat 4-5 servings of nuts, legumes, and seeds per week: 1 serving of dried beans or legumes equals  cup (90 g) cooked. 1 serving of nuts is  oz (12 almonds, 24 pistachios, or 7 walnut halves). 1 serving of seeds equals  oz (8 g). General information Eat more home-cooked food. Eat less restaurant, buffet, and fast  food. Limit or avoid alcohol. Limit foods that are high in starch and sugar. Avoid fried foods. Lose weight if you are overweight. Keep track of how much salt (sodium) you eat. This is important if you have high blood pressure. Ask your doctor to tell you more about this. Try to add vegetarian meals each week. Fats Choose healthy fats. These include olive oil and canola oil, flaxseeds, walnuts, almonds, and seeds. Eat more omega-3 fats. These include salmon, mackerel, sardines, tuna, flaxseed oil, and ground flaxseeds. Try to eat fish at least 2 times each week. Check food labels. Avoid foods with trans fats or high amounts of saturated fat. Limit saturated fats. These are often found in animal products, such as meats, butter, and cream. These are also found in plant foods, such as palm oil, palm kernel oil, and coconut oil. Avoid foods with partially hydrogenated oils in them. These have trans fats. Examples are stick margarine, some tub margarines, cookies, crackers, and other baked goods. What foods should I eat? Fruits All fresh, canned (in natural juice), or frozen fruits. Vegetables Fresh or frozen vegetables (raw, steamed, roasted, or grilled). Green salads. Grains Most grains. Choose whole wheat and whole  grains most of the time. Rice and pasta, including brown rice and pastas made with whole wheat. Meats and other proteins Lean, well-trimmed beef, veal, pork, and lamb. Chicken and [place] without skin. All fish and shellfish. Wild duck, rabbit, pheasant, and venison. Egg whites or low-cholesterol egg substitutes. Dried beans, peas, lentils, and tofu. Seeds and most nuts. Dairy Low-fat or nonfat cheeses, including ricotta and mozzarella. Skim or 1% milk that is liquid, powdered, or evaporated. Buttermilk that is made with low-fat milk. Nonfat or low-fat yogurt. Fats and oils Non-hydrogenated (trans-free) margarines. Vegetable oils, including soybean, sesame, sunflower, olive,  peanut, safflower, corn, canola, and cottonseed. Salad dressings or mayonnaise made with a vegetable oil. Beverages Mineral water. Coffee and tea. Diet carbonated beverages. Sweets and desserts Sherbet, gelatin, and fruit ice. Small amounts of dark chocolate. Limit all sweets and desserts. Seasonings and condiments All seasonings and condiments. The items listed above may not be a complete list of foods and drinks you can eat. Contact a dietitian for more options. What foods should I avoid? Fruits Canned fruit in heavy syrup. Fruit in cream or butter sauce. Fried fruit. Limit coconut. Vegetables Vegetables cooked in cheese, cream, or butter sauce. Fried vegetables. Grains Breads that are made with saturated or trans fats, oils, or whole milk. Croissants. Sweet rolls. Donuts. High-fat crackers, such as cheese crackers. Meats and other proteins Fatty meats, such as hot dogs, ribs, sausage, bacon, rib-eye roast or steak. High-fat deli meats, such as salami and bologna. Caviar. Domestic duck and goose. Organ meats, such as liver. Dairy Cream, sour cream, cream cheese, and creamed cottage cheese. Whole-milk cheeses. Whole or 2% milk that is liquid, evaporated, or condensed. Whole buttermilk. Cream sauce or high-fat cheese sauce. Yogurt that is made from whole milk. Fats and oils Meat fat, or shortening. Cocoa butter, hydrogenated oils, palm oil, coconut oil, palm kernel oil. Solid fats and shortenings, including bacon fat, salt pork, lard, and butter. Nondairy cream substitutes. Salad dressings with cheese or sour cream. Beverages Regular sodas and juice drinks with added sugar. Sweets and desserts Frosting. Pudding. Cookies. Cakes. Pies. Milk chocolate or white chocolate. Buttered syrups. Full-fat ice cream or ice cream drinks. The items listed above may not be a complete list of foods and drinks to avoid. Contact a dietitian for more information. Summary Heart-healthy meal planning includes  eating less unhealthy fats, eating more healthy fats, and making other changes in your diet. Eat a balanced diet. This includes fruits and vegetables, low-fat or nonfat dairy, lean protein, nuts and legumes, whole grains, and heart-healthy oils and fats. This information is not intended to replace advice given to you by your health care provider. Make sure you discuss any questions you have with your health care provider. Document Revised: 01/16/2022 Document Reviewed: 01/16/2022 Elsevier Patient Education  2024 [employer].

## 2023-08-21 ENCOUNTER — Ambulatory Visit: Payer: Medicare Other | Attending: Physician Assistant

## 2023-08-21 DIAGNOSIS — E7800 Pure hypercholesterolemia, unspecified: Secondary | ICD-10-CM

## 2023-08-22 LAB — HEPATIC FUNCTION PANEL
ALT: 17 IU/L (ref 0–32)
AST: 24 IU/L (ref 0–40)
Albumin: 4.5 g/dL (ref 3.8–4.8)
Alkaline Phosphatase: 64 IU/L (ref 44–121)
Bilirubin Total: 0.3 mg/dL (ref 0.0–1.2)
Bilirubin, Direct: <0.1 mg/dL (ref 0.00–0.40)
Total Protein: 6.9 g/dL (ref 6.0–8.5)

## 2023-08-22 LAB — LIPID PANEL
Chol/HDL Ratio: 3.2 ratio (ref 0.0–4.4)
Cholesterol, Total: 171 mg/dL (ref 100–199)
HDL: 53 mg/dL (ref >39)
LDL Chol Calc (NIH): 98 mg/dL (ref 0–99)
Triglycerides: 114 mg/dL (ref 0–149)
VLDL Cholesterol Cal: 20 mg/dL (ref 5–40)

## 2023-08-22 LAB — BASIC METABOLIC PANEL
BUN/Creatinine Ratio: 18 (ref 12–28)
BUN: 16 mg/dL (ref 8–27)
CO2: 25 mmol/L (ref 20–29)
Calcium: 9.7 mg/dL (ref 8.7–10.3)
Chloride: 105 mmol/L (ref 96–106)
Creatinine, Ser: 0.9 mg/dL (ref 0.57–1.00)
Glucose: 90 mg/dL (ref 70–99)
Potassium: 4.4 mmol/L (ref 3.5–5.2)
Sodium: 140 mmol/L (ref 134–144)
eGFR: 65 mL/min/{1.73_m2} (ref >59)

## 2023-09-06 ENCOUNTER — Ambulatory Visit (HOSPITAL_COMMUNITY): Payer: Medicare Other | Attending: Internal Medicine

## 2023-09-06 DIAGNOSIS — R42 Dizziness and giddiness: Secondary | ICD-10-CM | POA: Diagnosis not present

## 2023-09-06 DIAGNOSIS — R609 Edema, unspecified: Secondary | ICD-10-CM | POA: Diagnosis not present

## 2023-09-06 DIAGNOSIS — I517 Cardiomegaly: Secondary | ICD-10-CM

## 2023-09-06 DIAGNOSIS — I083 Combined rheumatic disorders of mitral, aortic and tricuspid valves: Secondary | ICD-10-CM

## 2023-09-06 DIAGNOSIS — I119 Hypertensive heart disease without heart failure: Secondary | ICD-10-CM | POA: Insufficient documentation

## 2023-09-06 DIAGNOSIS — E785 Hyperlipidemia, unspecified: Secondary | ICD-10-CM | POA: Diagnosis not present

## 2023-09-06 DIAGNOSIS — I080 Rheumatic disorders of both mitral and aortic valves: Secondary | ICD-10-CM | POA: Diagnosis not present

## 2023-09-06 LAB — ECHOCARDIOGRAM COMPLETE
AR max vel: 1.54 cm2
AV Area VTI: 2.11 cm2
AV Area mean vel: 1.72 cm2
AV Mean grad: 7.4 mmHg
AV Peak grad: 13.5 mmHg
Ao pk vel: 1.84 m/s
Area-P 1/2: 2.85 cm2
S' Lateral: 2.4 cm

## 2023-09-20 ENCOUNTER — Other Ambulatory Visit: Payer: Medicare Other

## 2023-10-15 ENCOUNTER — Emergency Department (HOSPITAL_BASED_OUTPATIENT_CLINIC_OR_DEPARTMENT_OTHER)
Admission: EM | Admit: 2023-10-15 | Discharge: 2023-10-15 | Disposition: A | Discharge status: Home / Self Care | Payer: Medicare Other | Attending: Emergency Medicine | Admitting: Emergency Medicine

## 2023-10-15 ENCOUNTER — Encounter (HOSPITAL_BASED_OUTPATIENT_CLINIC_OR_DEPARTMENT_OTHER): Payer: Self-pay | Admitting: Pediatrics

## 2023-10-15 ENCOUNTER — Emergency Department (HOSPITAL_BASED_OUTPATIENT_CLINIC_OR_DEPARTMENT_OTHER): Payer: Medicare Other

## 2023-10-15 ENCOUNTER — Other Ambulatory Visit: Payer: Self-pay

## 2023-10-15 DIAGNOSIS — I10 Essential (primary) hypertension: Secondary | ICD-10-CM | POA: Insufficient documentation

## 2023-10-15 DIAGNOSIS — R8289 Other abnormal findings on cytological and histological examination of urine: Secondary | ICD-10-CM | POA: Insufficient documentation

## 2023-10-15 DIAGNOSIS — R944 Abnormal results of kidney function studies: Secondary | ICD-10-CM | POA: Diagnosis not present

## 2023-10-15 DIAGNOSIS — Z79899 Other long term (current) drug therapy: Secondary | ICD-10-CM | POA: Diagnosis not present

## 2023-10-15 DIAGNOSIS — R1084 Generalized abdominal pain: Secondary | ICD-10-CM | POA: Diagnosis not present

## 2023-10-15 DIAGNOSIS — K5730 Diverticulosis of large intestine without perforation or abscess without bleeding: Secondary | ICD-10-CM | POA: Diagnosis not present

## 2023-10-15 DIAGNOSIS — R109 Unspecified abdominal pain: Secondary | ICD-10-CM | POA: Diagnosis not present

## 2023-10-15 DIAGNOSIS — I2510 Atherosclerotic heart disease of native coronary artery without angina pectoris: Secondary | ICD-10-CM | POA: Diagnosis not present

## 2023-10-15 DIAGNOSIS — K5900 Constipation, unspecified: Secondary | ICD-10-CM | POA: Diagnosis not present

## 2023-10-15 LAB — CBC WITH DIFFERENTIAL/PLATELET
Abs Immature Granulocytes: 0.01 10*3/uL (ref 0.00–0.07)
Basophils Absolute: 0 10*3/uL (ref 0.0–0.1)
Basophils Relative: 0 %
Eosinophils Absolute: 0.1 10*3/uL (ref 0.0–0.5)
Eosinophils Relative: 2 %
HCT: 39 % (ref 36.0–46.0)
Hemoglobin: 12.9 g/dL (ref 12.0–15.0)
Immature Granulocytes: 0 %
Lymphocytes Relative: 30 %
Lymphs Abs: 1.7 10*3/uL (ref 0.7–4.0)
MCH: 29.7 pg (ref 26.0–34.0)
MCHC: 33.1 g/dL (ref 30.0–36.0)
MCV: 89.9 fL (ref 80.0–100.0)
Monocytes Absolute: 0.6 10*3/uL (ref 0.1–1.0)
Monocytes Relative: 11 %
Neutro Abs: 3.2 10*3/uL (ref 1.7–7.7)
Neutrophils Relative %: 57 %
Platelets: 196 10*3/uL (ref 150–400)
RBC: 4.34 MIL/uL (ref 3.87–5.11)
RDW: 13.2 % (ref 11.5–15.5)
WBC: 5.6 10*3/uL (ref 4.0–10.5)
nRBC: 0 % (ref 0.0–0.2)

## 2023-10-15 LAB — URINALYSIS, ROUTINE W REFLEX MICROSCOPIC
Bilirubin Urine: NEGATIVE
Glucose, UA: NEGATIVE mg/dL
Ketones, ur: NEGATIVE mg/dL
Nitrite: NEGATIVE
Protein, ur: NEGATIVE mg/dL
Specific Gravity, Urine: <=1.005 (ref 1.005–1.030)
pH: 5.5 (ref 5.0–8.0)

## 2023-10-15 LAB — URINALYSIS, MICROSCOPIC (REFLEX)

## 2023-10-15 LAB — COMPREHENSIVE METABOLIC PANEL
ALT: 19 U/L (ref 0–44)
AST: 25 U/L (ref 15–41)
Albumin: 4.1 g/dL (ref 3.5–5.0)
Alkaline Phosphatase: 45 U/L (ref 38–126)
Anion gap: 13 (ref 5–15)
BUN: 20 mg/dL (ref 8–23)
CO2: 22 mmol/L (ref 22–32)
Calcium: 9.1 mg/dL (ref 8.9–10.3)
Chloride: 101 mmol/L (ref 98–111)
Creatinine, Ser: 1.01 mg/dL — ABNORMAL HIGH (ref 0.44–1.00)
GFR, Estimated: 57 mL/min — ABNORMAL LOW (ref >60)
Glucose, Bld: 105 mg/dL — ABNORMAL HIGH (ref 70–99)
Potassium: 4 mmol/L (ref 3.5–5.1)
Sodium: 136 mmol/L (ref 135–145)
Total Bilirubin: 0.8 mg/dL (ref 0.3–1.2)
Total Protein: 7.3 g/dL (ref 6.5–8.1)

## 2023-10-15 LAB — LIPASE, BLOOD: Lipase: 31 U/L (ref 11–51)

## 2023-10-15 MED ORDER — IOHEXOL 300 MG/ML  SOLN
100.0000 mL | Freq: Once | INTRAMUSCULAR | Status: AC | PRN
  Administered 2023-10-15: 100 mL via INTRAVENOUS

## 2023-10-15 NOTE — ED Notes (Signed)
ED Provider at bedside. 

## 2023-10-15 NOTE — ED Provider Notes (Signed)
[facility] Provider Note   CSN: [id number] Arrival date & time: 10/15/23  1100     History  Chief Complaint  Patient presents with   Constipation    Gloria Werner is a 82 y.o. female.  82 year old female presents with husband with concern for left side abdominal pain and constipation. Reports constipation at baseline, managed with metamucil. Left town for the weekend and developed left side pain with constipation and possible fever (husband reports possible 100.0 fever by tactile measurement, max temp once thermometer obtained of 99). Has tried enema and suppository without relief, daughter is Charity fundraiser, attempted to disimpact unsuccessfully. States occasional abdominal pains since bladder tact when her surgeon told her he didn't get the bowel up as much as he would have liked, also prior complete hysterectomy. Also reports feeling a lump in her throat. Denies nausea, vomiting. Is not on blood thinners.  Prior colonoscopy found to have significant diverticulosis.        Home Medications Prior to Admission medications   Medication Sig Start Date End Date Taking? Authorizing Provider  acetaminophen (TYLENOL) 500 MG tablet Take 1-2 tablets (500-1,000 mg total) by mouth at bedtime as needed for moderate pain. 09/08/22   Garnette Gunner, MD  albuterol (VENTOLIN HFA) 108 (90 Base) MCG/ACT inhaler Inhale 1-2 puffs into the lungs every 6 (six) hours as needed. 02/14/22   Parrett, Virgel Bouquet, NP  amLODipine (NORVASC) 5 MG tablet Take 1 tablet (5 mg total) by mouth daily. 08/14/23   Sharlene Dory, PA-C  Biotin 65784 MCG TABS Take 10,000 mcg by mouth daily.    [provider]  buPROPion (WELLBUTRIN XL) 150 MG 24 hr tablet Take 1 tablet (150 mg total) by mouth every morning. 04/26/23   Loyola Mast, MD  calcium carbonate (OSCAL) 1500 (600 Ca) MG TABS tablet Take 600 mg of elemental calcium by mouth daily with breakfast.    [provider]  Carboxymethylcellul-Glycerin (LUBRICATING EYE DROPS OP) Place 1 drop into both eyes daily as needed (dry eyes). Visine    [provider]  Docusate Calcium (STOOL SOFTENER PO) Take 1 tablet by mouth daily.    [provider]  ezetimibe (ZETIA) 10 MG tablet Take 1 tablet (10 mg total) by mouth daily. 02/26/23   Loyola Mast, MD  losartan (COZAAR) 25 MG tablet Take 1 tablet (25 mg total) by mouth daily. 08/14/23 11/12/23  Sharlene Dory, PA-C  polyethylene glycol (MIRALAX / GLYCOLAX) 17 g packet Take 17 g by mouth daily. Takes an extra scoop in the evening as needed for moderate-severe constipation.    [provider]  pyridOXINE (VITAMIN B-6) 100 MG tablet Take 100 mg by mouth daily.    [provider]  saccharomyces boulardii (FLORASTOR) 250 MG capsule Take 2 capsules (500 mg total) by mouth 2 (two) times daily. Patient taking differently: Take 250 mg by mouth daily. 11/05/15   Esterwood, Amy S, PA-C  triamcinolone (NASACORT ALLERGY 24HR) 55 MCG/ACT AERO nasal inhaler Place 1 spray into the nose daily.    [provider]  vitamin C (ASCORBIC ACID) 500 MG tablet Take 500 mg by mouth daily.    [provider]  Vitamin D, Ergocalciferol, (DRISDOL) 1.25 MG (50000 UNIT) CAPS capsule Take 1 capsule (50,000 Units total) by mouth every 7 (seven) days. 02/26/23   Loyola Mast, MD      Allergies    Lexapro [escitalopram], Ace inhibitors,  and Codeine    Review of Systems   Review of Systems Negative except as per HPI Physical Exam Updated Vital Signs BP (!) 140/79   Pulse 88   Temp 98.5 F (36.9 C)   Resp 18   Ht 5' (1.524 m)   Wt 68 kg   LMP 12/25/1986 (Approximate)   SpO2 97%   BMI 29.29 kg/m  Physical Exam Vitals and nursing note reviewed.  Constitutional:      General: She is not in acute distress.    Appearance: She is well-developed. She is not diaphoretic.  HENT:     Head: Normocephalic and atraumatic.   Pulmonary:     Effort: Pulmonary effort is normal.  Abdominal:     General: Bowel sounds are normal.     Palpations: Abdomen is soft.     Tenderness: There is no abdominal tenderness.  Musculoskeletal:     Right lower leg: No edema.     Left lower leg: No edema.  Skin:    General: Skin is warm and dry.     Findings: No erythema or rash.  Neurological:     Mental Status: She is alert and oriented to person, place, and time.  Psychiatric:        Behavior: Behavior normal.     ED Results / Procedures / Treatments   Labs (all labs ordered are listed, but only abnormal results are displayed) Labs Reviewed  COMPREHENSIVE METABOLIC PANEL - Abnormal; Notable for the following components:      Result Value   Glucose, Bld 105 (*)    Creatinine, Ser 1.01 (*)    GFR, Estimated 57 (*)    All other components within normal limits  URINALYSIS, ROUTINE W REFLEX MICROSCOPIC - Abnormal; Notable for the following components:   Hgb urine dipstick TRACE (*)    Leukocytes,Ua TRACE (*)    All other components within normal limits  URINALYSIS, MICROSCOPIC (REFLEX) - Abnormal; Notable for the following components:   Bacteria, UA RARE (*)    All other components within normal limits  CBC WITH DIFFERENTIAL/PLATELET  LIPASE, BLOOD    EKG None  Radiology CT ABDOMEN PELVIS W CONTRAST  Result Date: 10/15/2023 CLINICAL DATA:  Left flank pain.  Constipation. EXAM: CT ABDOMEN AND PELVIS WITH CONTRAST TECHNIQUE: Multidetector CT imaging of the abdomen and pelvis was performed using the standard protocol following bolus administration of intravenous contrast. RADIATION DOSE REDUCTION: This exam was performed according to the departmental dose-optimization program which includes automated exposure control, adjustment of the mA and/or kV according to patient size and/or use of iterative reconstruction technique. CONTRAST:  OMNIPAQUE IOHEXOL 300 MG/ML  SOLN COMPARISON:  02/04/2020 CT abdomen/pelvis  FINDINGS: Lower chest: Solid 0.5 cm peripheral right lower lobe pulmonary nodule on series 302/image 7 is stable and considered benign. No acute abnormality at the lung bases. Hepatobiliary: Normal liver size. No liver mass. Normal gallbladder with no radiopaque cholelithiasis. No biliary ductal dilatation. Pancreas: Normal, with no mass or duct dilation. Spleen: Normal size. No mass. Adrenals/Urinary Tract: Normal adrenals. Normal kidneys with no hydronephrosis and no renal mass. Bladder obscured by streak artifact from right hip hardware. No gross bladder abnormality. Stomach/Bowel: Normal non-distended stomach. Normal caliber small bowel with no small bowel wall thickening. Normal appendix. Marked left colonic diverticulosis with no large bowel wall thickening or significant pericolonic fat stranding. Vascular/Lymphatic: Atherosclerotic nonaneurysmal abdominal aorta. Patent portal, splenic, hepatic and renal veins. No pathologically enlarged lymph nodes in the abdomen or pelvis. Reproductive:  Status post hysterectomy, with no abnormal findings at the vaginal cuff. No adnexal mass. Other: No pneumoperitoneum, ascites or focal fluid collection. Musculoskeletal: No aggressive appearing focal osseous lesions. Right total hip arthroplasty IMPRESSION: 1. No acute abnormality. No evidence of bowel obstruction or acute bowel inflammation. Marked left colonic diverticulosis, with no evidence of acute diverticulitis. 2.  Aortic Atherosclerosis (ICD10-I70.0). Electronically Signed   By: Delbert Phenix M.D.   On: 10/15/2023 16:42    Procedures Procedures    Medications Ordered in ED Medications  iohexol (OMNIPAQUE) 300 MG/ML solution 100 mL (100 mLs Intravenous Contrast Given 10/15/23 1328)    ED Course/ Medical Decision Making/ A&P                                 Medical Decision Making Amount and/or Complexity of Data Reviewed Labs: ordered. Radiology: ordered.  Risk Prescription drug  management.   This patient presents to the ED for concern of abdominal pain/constipation, this involves an extensive number of treatment options, and is a complaint that carries with it a high risk of complications and morbidity.  The differential diagnosis includes but not limited to diverticulitis, constipation, mass   Co morbidities that complicate the patient evaluation  IBS, HTN, OSA, hyperlipidemia, CAD   Additional history obtained:  Additional history obtained from husband at bedside who contributes to history as above External records from outside source obtained and reviewed including prior labs on file for comparison    Lab Tests:  I Ordered, and personally interpreted labs.  The pertinent results include:  CBC WNL. CMP with minimally elevated Cr/GFR. UA with trace hgb and trace leukocytes without urinary symptoms. Lipase WNL.    Imaging Studies ordered:  I ordered imaging studies including CT a/p with contrast   I independently visualized and interpreted imaging which showed constipation  I agree with the radiologist interpretation   Problem List / ED Course / Critical interventions / Medication management  82 year old female with complaint of abdominal pain and constipation. Abdomen is soft and non tender, bowel sounds normal. Labs reassuring. CT with constipation, no bowel obstruction. Offered rectal/disimpaction/enema. Patient prefers home bowel prep. Recommend dulcolax and miralax.  I have reviewed the patients home medicines and have made adjustments as needed   Social Determinants of Health:  Lives with spouse, has PCP   Test / Admission - Considered:  Stable for dc. Offered ER enema, disimpaction, patient prefers dc home to try bowel prep          Final Clinical Impression(s) / ED Diagnoses Final diagnoses:  Generalized abdominal pain  Constipation, unspecified constipation type    Rx / DC Orders ED Discharge Orders     None          Jeannie Fend, PA-C 10/15/23 1719    Lonell Grandchild, MD 10/16/23 [id number]

## 2023-10-15 NOTE — ED Triage Notes (Signed)
C/O constipation stating last good BM was a  week ago; has used enema and suppository and only had a small pebble this morning. Stated some pressure in left lower quadrant of her abdominal area. C/O left flank pain, occasional when leaning forward.

## 2023-10-15 NOTE — Discharge Instructions (Addendum)
Take 2 Dulcolax tablets.  Drink 8 oz of fluids with miralax, repeat in 15 minutes.  Repeat tomorrow if you have not had a bowel movement.   Follow up with your doctor.  You can also try glycerin suppositories.

## 2023-10-16 ENCOUNTER — Telehealth: Payer: Self-pay

## 2023-10-16 ENCOUNTER — Other Ambulatory Visit: Payer: Self-pay | Admitting: Family Medicine

## 2023-10-16 DIAGNOSIS — F419 Anxiety disorder, unspecified: Secondary | ICD-10-CM

## 2023-10-16 DIAGNOSIS — F32A Depression, unspecified: Secondary | ICD-10-CM

## 2023-10-16 NOTE — Transitions of Care (Post Inpatient/ED Visit) (Signed)
10/16/2023  Name: Gloria Werner MRN: [id number] DOB: 1941-08-11  Today's TOC FU Call Status: Today's TOC FU Call Status:: Successful TOC FU Call Completed TOC FU Call Complete Date: 10/16/23 Patient's Name and Date of Birth confirmed.  Transition Care Management Follow-up Telephone Call Date of Discharge: 10/15/23 Discharge Facility: [facility] Type of Discharge: Emergency Department Reason for ED Visit: Other: How have you been since you were released from the hospital?: Better Any questions or concerns?: No  Items Reviewed: Did you receive and understand the discharge instructions provided?: Yes Medications obtained,verified, and reconciled?: Yes (Medications Reviewed) Any new allergies since your discharge?: No Dietary orders reviewed?: NA Do you have support at home?: Yes People in Home: spouse  Medications Reviewed Today: Medications Reviewed Today   Medications were not reviewed in this encounter     Home Care and Equipment/Supplies: Were Home Health Services Ordered?: NA Any new equipment or medical supplies ordered?: NA  Functional Questionnaire: Do you need assistance with bathing/showering or dressing?: No Do you need assistance with meal preparation?: No Do you need assistance with eating?: No Do you have difficulty maintaining continence: No Do you need assistance with getting out of bed/getting out of a chair/moving?: No Do you have difficulty managing or taking your medications?: No  Follow up appointments reviewed: PCP Follow-up appointment confirmed?: Yes Date of PCP follow-up appointment?: 10/17/23 Follow-up Provider: Dr. Veto Kemps [facility] Follow-up appointment confirmed?: NA Do you need transportation to your follow-up appointment?: No Do you understand care options if your condition(s) worsen?: Yes-patient verbalized understanding    SIGNATURE Marciana Uplinger D, cma

## 2023-10-17 ENCOUNTER — Encounter: Payer: Self-pay | Admitting: Family Medicine

## 2023-10-17 ENCOUNTER — Ambulatory Visit: Payer: Medicare Other | Admitting: Family Medicine

## 2023-10-17 VITALS — BP 120/72 | HR 76 | Temp 97.8°F | Ht 60.0 in | Wt 154.8 lb

## 2023-10-17 DIAGNOSIS — K5909 Other constipation: Secondary | ICD-10-CM | POA: Diagnosis not present

## 2023-10-17 NOTE — Progress Notes (Signed)
[facility] [address] [place] [address] Dept: [phone] Dept Fax: [phone]  Office Visit  Subjective:    Patient ID: Gloria Werner, female    DOB: 11-Apr-1941, 82 y.o..   MRN: [id number]  Chief Complaint  Patient presents with   Hospitalization Follow-up    Hospital f/u for Abdominal pain on 10/15/23.  Patient was constipated.  Feeling better.    History of Present Illness:  Patient is in today for follow-up from a recent ED visit. Gloria Werner was seen at [facility] on 10/21 having not had a bowel movement for 4 days. She had been noting pain in the left abdomen with bending over. She had just returned from a trip to [place], [place] over the weekend. She had tried an enema and a suppository and her daughter had attempted a manual disimpaction, but without success. Gloria Werner has a history of chronic constipation. She takes a daily dose of Miralax. She admits she may not drink enough fluids. The ED performed blood tests and a CT of the Abdomen. They sent her home to try a regimen of Miralax taken together with 2 Dulcolax, to then repeat in 15 min. She tried this Monday evening. She had some results with this, but yesterday morning was still feeling some discomfort, so she repeat the treatment. She had some watery bowel movement later. Her most recent bowel movement was normal.  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   DDD (degenerative disc disease), lumbar 09/28/2019   Aortic atherosclerosis (HCC) 09/28/2019   Gastroesophageal reflux disease without esophagitis 02/04/2019   OSA  (obstructive sleep apnea) 01/27/2019   Pulmonary nodule 01/27/2019   Allergic rhinitis 11/06/2018   Upper airway cough syndrome 09/24/2018   Primary osteoarthritis of first carpometacarpal joint of left hand 07/03/2018   Essential hypertension 06/03/2018   Anxiety and depression 01/17/2017   Vitamin D insufficiency 01/17/2017   Osteopenia 04/08/2015   Irritable bowel syndrome (IBS) 02/03/2008   Past Surgical History:  Procedure Laterality Date   ABDOMINAL HYSTERECTOMY  1988   secondary to prolapse   BLADDER SUSPENSION     A-P with Hyst   CATARACT EXTRACTION Bilateral    INSERTION OF MESH  10/16/2018   Procedure: INSERTION OF MESH;  Surgeon: Crist Fat, MD;  Location: [facility];  Service: Urology;;   NASAL SINUS SURGERY  1970   REVERSE SHOULDER ARTHROPLASTY Left 06/08/2022   Procedure: REVERSE SHOULDER ARTHROPLASTY;  Surgeon: Francena Hanly, MD;  Location: [facility];  Service: Orthopedics;  Laterality: Left;   ROBOTIC ASSISTED LAPAROSCOPIC SACROCOLPOPEXY N/A 10/16/2018   Procedure: XI ROBOTIC ASSISTED LAPAROSCOPIC SACROCOLPOPEXY;  Surgeon: Crist Fat, MD;  Location: [facility];  Service: Urology;  Laterality: N/A;   TONSILLECTOMY     TOTAL HIP ARTHROPLASTY Right 11/04/2021   Procedure: RIGHT TOTAL HIP ARTHROPLASTY ANTERIOR APPROACH;  Surgeon: Kathryne Hitch, MD;  Location: [facility];  Service: Orthopedics;  Laterality: Right;   Family History  Problem Relation Age of Onset   Emphysema Mother    Lymphoma Mother    Asthma Mother    Cancer Father 52       lung cancer   Coronary  artery disease Brother    Cancer Brother        breast cancer   Breast cancer Brother    Cancer Brother        lung cancer   Diabetes Neg Hx    Hypertension Neg Hx    Colon cancer Neg Hx    Esophageal cancer Neg Hx    Rectal cancer Neg Hx    Stomach cancer Neg Hx    Outpatient Medications Prior to Visit  Medication Sig Dispense Refill   acetaminophen (TYLENOL) 500 MG tablet Take 1-2  tablets (500-1,000 mg total) by mouth at bedtime as needed for moderate pain. 30 tablet 0   albuterol (VENTOLIN HFA) 108 (90 Base) MCG/ACT inhaler Inhale 1-2 puffs into the lungs every 6 (six) hours as needed. 8 g 2   amLODipine (NORVASC) 5 MG tablet Take 1 tablet (5 mg total) by mouth daily. 90 tablet 3   Biotin 16109 MCG TABS Take 10,000 mcg by mouth daily.     buPROPion (WELLBUTRIN XL) 150 MG 24 hr tablet TAKE 1 TABLET BY MOUTH ONCE DAILY IN THE MORNING 90 tablet 3   calcium carbonate (OSCAL) 1500 (600 Ca) MG TABS tablet Take 600 mg of elemental calcium by mouth daily with breakfast.     Carboxymethylcellul-Glycerin (LUBRICATING EYE DROPS OP) Place 1 drop into both eyes daily as needed (dry eyes). Visine     Docusate Calcium (STOOL SOFTENER PO) Take 1 tablet by mouth daily.     ezetimibe (ZETIA) 10 MG tablet Take 1 tablet (10 mg total) by mouth daily. 90 tablet 3   losartan (COZAAR) 25 MG tablet Take 1 tablet (25 mg total) by mouth daily. 90 tablet 3   polyethylene glycol (MIRALAX / GLYCOLAX) 17 g packet Take 17 g by mouth daily. Takes an extra scoop in the evening as needed for moderate-severe constipation.     pyridOXINE (VITAMIN B-6) 100 MG tablet Take 100 mg by mouth daily.     saccharomyces boulardii (FLORASTOR) 250 MG capsule Take 2 capsules (500 mg total) by mouth 2 (two) times daily. (Patient taking differently: Take 250 mg by mouth daily.) 120 capsule 3   triamcinolone (NASACORT ALLERGY 24HR) 55 MCG/ACT AERO nasal inhaler Place 1 spray into the nose daily.     vitamin C (ASCORBIC ACID) 500 MG tablet Take 500 mg by mouth daily.     Vitamin D, Ergocalciferol, (DRISDOL) 1.25 MG (50000 UNIT) CAPS capsule Take 1 capsule (50,000 Units total) by mouth every 7 (seven) days. 12 capsule 3   No facility-administered medications prior to visit.   Allergies  Allergen Reactions   Lexapro [Escitalopram] Diarrhea and Other (See Comments)    headache   Ace Inhibitors Cough   Codeine Nausea And  Vomiting     Objective:   Today's Vitals   10/17/23 0917  BP: 120/72  Pulse: 76  Temp: 97.8 F (36.6 C)  TempSrc: Temporal  SpO2: 98%  Weight: 154 lb 12.8 oz (70.2 kg)  Height: 5' (1.524 m)   Body mass index is 30.23 kg/m.   General: Well developed, well nourished. No acute distress. Abdomen: Soft, non-tender. Bowel sounds positive, normal pitch and frequency. No hepatosplenomegaly. No rebound   or guarding. Psych: Alert and oriented. Normal mood and affect.  Health Maintenance Due  Topic Date Due   Medicare Annual Wellness (AWV)  10/11/2023   Lab Results    Latest Ref Rng & Units 10/15/2023   12:22 PM 06/29/2023   11:50 AM  06/01/2022    2:11 PM  CBC  WBC 4.0 - 10.5 K/uL 5.6  6.2  7.0   Hemoglobin 12.0 - 15.0 g/dL 63.8  75.6  43.3   Hematocrit 36.0 - 46.0 % 39.0  42.8  40.9   Platelets 150 - 400 K/uL 196  223.0  201       Latest Ref Rng & Units 10/15/2023   12:22 PM 08/21/2023    8:38 AM 06/29/2023   11:50 AM  CMP  Glucose 70 - 99 mg/dL 295  90  188   BUN 8 - 23 mg/dL 20  16  27    Creatinine 0.44 - 1.00 mg/dL 4.16  6.06  3.01   Sodium 135 - 145 mmol/L 136  140  139   Potassium 3.5 - 5.1 mmol/L 4.0  4.4  4.1   Chloride 98 - 111 mmol/L 101  105  104   CO2 22 - 32 mmol/L 22  25  25    Calcium 8.9 - 10.3 mg/dL 9.1  9.7  60.1   Total Protein 6.5 - 8.1 g/dL 7.3  6.9  6.9   Total Bilirubin 0.3 - 1.2 mg/dL 0.8  0.3  0.3   Alkaline Phos 38 - 126 U/L 45  64  53   AST 15 - 41 U/L 25  24  24    ALT 0 - 44 U/L 19  17  15     Lipase     Component Value Date/Time   LIPASE 31 10/15/2023 1222   Component Ref Range & Units 2 d ago (10/15/23)  Color, Urine YELLOW YELLOW  APPearance CLEAR CLEAR  Specific Gravity, Urine 1.005 - 1.030 <=1.005  pH 5.0 - 8.0 5.5  Glucose, UA NEGATIVE mg/dL NEGATIVE  Hgb urine dipstick NEGATIVE TRACE Abnormal   Bilirubin Urine NEGATIVE NEGATIVE  Ketones, ur NEGATIVE mg/dL NEGATIVE  Protein, ur NEGATIVE mg/dL NEGATIVE   Nitrite NEGATIVE NEGATIVE  Leukocytes,Ua NEGATIVE TRACE Abnormal    Imaging: CT of Abdomen and Pelvis w contrast (10/15/2023) IMPRESSION: 1. No acute abnormality. No evidence of bowel obstruction or acute bowel inflammation. Marked left colonic diverticulosis, with no evidence of acute diverticulitis. 2.  Aortic Atherosclerosis (ICD10-I70.0).    Assessment & Plan:   Problem List Items Addressed This Visit       Digestive   Chronic constipation - Primary    Apparently, Ms. Allensworth has gotten away from her fiber regimen. I discussed both inclusion of high fiber foods in her diet and use of a daily fiber supplement. I provided her with information about making a home fiber supplement that may be more palatable than some commercial products. I also discussed use of Miralax to prevent obstipation.        Return in about 3 months (around 01/17/2024).   Loyola Mast, MD

## 2023-10-17 NOTE — Assessment & Plan Note (Signed)
Apparently, Gloria Werner has gotten away from her fiber regimen. I discussed both inclusion of high fiber foods in her diet and use of a daily fiber supplement. I provided her with information about making a home fiber supplement that may be more palatable than some commercial products. I also discussed use of Miralax to prevent obstipation.

## 2023-10-17 NOTE — Patient Instructions (Signed)
Constipation: Our goal is to achieve formed bowel movements daily or every-other-day.  You may need to try different combinations of the following options to find what works best for you - everybody's body works differently so feel free to adjust the dosages as needed.  Some options to help maintain bowel health include:   Dietary changes (more leafy greens, vegetables and fruits; less processed foods) Fiber supplementation (Benefiber, FiberCon, Metamucil or Psyllium). Start slow and increase gradually to full dose. Over-the-counter agents such as: stool softeners (Docusate or Colace) and/or laxatives (Miralax, milk of magnesia)  "Power Pudding" is a natural mixture that may help your constipation.  To make blend 1 cup applesauce, 1 cup wheat bran, and 3/4 cup prune juice, refrigerate and then take 1 tablespoon daily with a large glass of water as needed.

## 2023-10-22 ENCOUNTER — Ambulatory Visit (INDEPENDENT_AMBULATORY_CARE_PROVIDER_SITE_OTHER): Payer: Medicare Other

## 2023-10-22 DIAGNOSIS — Z0000 Encounter for general adult medical examination without abnormal findings: Secondary | ICD-10-CM | POA: Diagnosis not present

## 2023-10-22 NOTE — Patient Instructions (Signed)
Ms. Cianci , Thank you for taking time to come for your Medicare Wellness Visit. I appreciate your ongoing commitment to your health goals. Please review the following plan we discussed and let me know if I can assist you in the future.   Referrals/Orders/Follow-Ups/Clinician Recommendations: none  This is a list of the screening recommended for you and due dates:  Health Maintenance  Topic Date Due   Flu Shot  03/24/2024*   Mammogram  03/12/2024   Medicare Annual Wellness Visit  10/21/2024   DTaP/Tdap/Td vaccine (3 - Td or Tdap) 09/12/2032   Pneumonia Vaccine  Completed   DEXA scan (bone density measurement)  Completed   Hepatitis C Screening  Completed   Zoster (Shingles) Vaccine  Completed   HPV Vaccine  Aged Out   COVID-19 Vaccine  Discontinued   Cologuard (Stool DNA test)  Discontinued  *Topic was postponed. The date shown is not the original due date.    Advanced directives: (In Chart) A copy of your advanced directives are scanned into your chart should your provider ever need it.  Next Medicare Annual Wellness Visit scheduled for next year: Yes  Insert Preventive Care attachment Insert FALL PREVENTION attachment if needed

## 2023-10-22 NOTE — Progress Notes (Signed)
Subjective:   Gloria Werner is a 82 y.o. female who presents for Medicare Annual (Subsequent) preventive examination.  Visit Complete: Virtual I connected with  Rebecca Eaton on 10/22/23 by a audio enabled telemedicine application and verified that I am speaking with the correct person using two identifiers.  Patient Location: Home  Provider Location: Office/Clinic  I discussed the limitations of evaluation and management by telemedicine. The patient expressed understanding and agreed to proceed.  Vital Signs: Because this visit was a virtual/telehealth visit, some criteria may be missing or patient reported. Any vitals not documented were not able to be obtained and vitals that have been documented are patient reported.    Cardiac Risk Factors include: advanced age (>73men, >2 women)     Objective:    Today's Vitals   There is no height or weight on file to calculate BMI.     10/22/2023    9:47 AM 10/15/2023   11:10 AM 08/06/2023   12:59 PM 02/14/2023    4:34 PM 10/10/2022    1:22 PM 09/28/2022    2:10 PM 06/01/2022    1:55 PM  Advanced Directives  Does Patient Have a Medical Advance Directive? Yes No Yes No Yes Yes Yes  Type of Estate agent of [place];Living will  Healthcare Power of [place];Living will;Out of facility DNR (pink MOST or yellow form)  Healthcare Power of [place];Living will  Healthcare Power of [place];Living will  Does patient want to make changes to medical advance directive?     No - Patient declined    Copy of Healthcare Power of Attorney in Chart? Yes - validated most recent copy scanned in chart (See row information)    Yes - validated most recent copy scanned in chart (See row information)  Yes - validated most recent copy scanned in chart (See row information)  Would patient like information on creating a medical advance directive?  No - Patient declined  No - Patient declined       Current Medications  (verified) Outpatient Encounter Medications as of 10/22/2023  Medication Sig   acetaminophen (TYLENOL) 500 MG tablet Take 1-2 tablets (500-1,000 mg total) by mouth at bedtime as needed for moderate pain.   albuterol (VENTOLIN HFA) 108 (90 Base) MCG/ACT inhaler Inhale 1-2 puffs into the lungs every 6 (six) hours as needed.   amLODipine (NORVASC) 5 MG tablet Take 1 tablet (5 mg total) by mouth daily.   Biotin 08657 MCG TABS Take 10,000 mcg by mouth daily.   buPROPion (WELLBUTRIN XL) 150 MG 24 hr tablet TAKE 1 TABLET BY MOUTH ONCE DAILY IN THE MORNING   calcium carbonate (OSCAL) 1500 (600 Ca) MG TABS tablet Take 600 mg of elemental calcium by mouth daily with breakfast.   Carboxymethylcellul-Glycerin (LUBRICATING EYE DROPS OP) Place 1 drop into both eyes daily as needed (dry eyes). Visine   Docusate Calcium (STOOL SOFTENER PO) Take 1 tablet by mouth daily.   ezetimibe (ZETIA) 10 MG tablet Take 1 tablet (10 mg total) by mouth daily.   losartan (COZAAR) 25 MG tablet Take 1 tablet (25 mg total) by mouth daily.   polyethylene glycol (MIRALAX / GLYCOLAX) 17 g packet Take 17 g by mouth daily. Takes an extra scoop in the evening as needed for moderate-severe constipation.   pyridOXINE (VITAMIN B-6) 100 MG tablet Take 100 mg by mouth daily.   saccharomyces boulardii (FLORASTOR) 250 MG capsule Take 2 capsules (500 mg total) by mouth 2 (two) times daily. (Patient  taking differently: Take 250 mg by mouth daily.)   triamcinolone (NASACORT ALLERGY 24HR) 55 MCG/ACT AERO nasal inhaler Place 1 spray into the nose daily.   vitamin C (ASCORBIC ACID) 500 MG tablet Take 500 mg by mouth daily.   Vitamin D, Ergocalciferol, (DRISDOL) 1.25 MG (50000 UNIT) CAPS capsule Take 1 capsule (50,000 Units total) by mouth every 7 (seven) days.   No facility-administered encounter medications on file as of 10/22/2023.    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   Past Surgical History:  Procedure Laterality Date   ABDOMINAL HYSTERECTOMY  1988   secondary to prolapse   BLADDER SUSPENSION     A-P with Hyst   CATARACT EXTRACTION Bilateral    INSERTION OF MESH  10/16/2018   Procedure: INSERTION OF MESH;  Surgeon: Crist Fat, MD;  Location: [facility];  Service: Urology;;   NASAL SINUS SURGERY  1970   REVERSE SHOULDER ARTHROPLASTY Left 06/08/2022   Procedure: REVERSE SHOULDER ARTHROPLASTY;  Surgeon: Francena Hanly, MD;  Location: [facility];  Service: Orthopedics;  Laterality: Left;   ROBOTIC ASSISTED LAPAROSCOPIC SACROCOLPOPEXY N/A 10/16/2018   Procedure: XI ROBOTIC ASSISTED LAPAROSCOPIC SACROCOLPOPEXY;  Surgeon: Crist Fat, MD;  Location: [facility];  Service: Urology;  Laterality: N/A;   TONSILLECTOMY     TOTAL HIP ARTHROPLASTY Right 11/04/2021   Procedure: RIGHT TOTAL HIP ARTHROPLASTY ANTERIOR APPROACH;  Surgeon: Kathryne Hitch, MD;  Location: [facility];  Service: Orthopedics;  Laterality: Right;   Family History  Problem Relation Age of Onset   Emphysema Mother    Lymphoma Mother    Asthma Mother    Cancer Father 1       lung cancer   Coronary artery disease Brother    Cancer Brother        breast cancer   Breast cancer Brother    Cancer Brother        lung cancer   Diabetes Neg Hx    Hypertension Neg Hx    Colon cancer Neg Hx    Esophageal cancer Neg Hx    Rectal cancer Neg Hx    Stomach cancer Neg Hx    Social History   Socioeconomic History    Marital status: Married    Spouse name: Adela Glimpse   Number of children: 2   Years of education: 12   Highest education level: Not on file  Occupational History   Occupation: Loss adjuster, chartered   Occupation: Retired  Tobacco Use   Smoking status: Never   Smokeless tobacco: Never  Vaping Use   Vaping status: Never Used  Substance and Sexual Activity   Alcohol use: No   Drug use: No   Sexual activity: Yes    Partners: Male    Birth control/protection: Surgical    Comment: hysterectomy  Other Topics Concern   Not on file  Social History Narrative   HSG. Married '  42. 1 son- 41'; 1-daughter-'68; grandchildren 3.  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 Provided lead to http://bridges.com/.            Are you right handed or left handed? Right Handed. Uses Left hand as well   Are you currently employed ? No    What is your current occupation?   Do you live at home alone? No    Who lives with you? With husband    What type of home do you live in: 1 story or 2 story? One story home        Social Determinants of Health   Financial Resource Strain: Low Risk  (10/22/2023)   Overall Financial Resource Strain (CARDIA)    Difficulty of Paying Living Expenses: Not hard at all  Food Insecurity: No Food Insecurity (10/22/2023)   Hunger Vital Sign    Worried About Running Out of Food in the Last Year: Never true    Ran Out of Food in the Last Year: Never true  Transportation Needs: No Transportation Needs (10/22/2023)   PRAPARE - Administrator, Civil Service (Medical): No    Lack of Transportation (Non-Medical): No  Physical Activity: Insufficiently Active (10/22/2023)   Exercise Vital Sign    Days of Exercise per Week: 3 days    Minutes of Exercise per Session: 30 min  Stress: No Stress Concern Present (10/22/2023)   [employer] of Occupational Health - Occupational Stress Questionnaire    Feeling of Stress : Not at all  Social  Connections: Moderately Integrated (10/22/2023)   Social Connection and Isolation Panel [NHANES]    Frequency of Communication with Friends and Family: More than three times a week    Frequency of Social Gatherings with Friends and Family: Three times a week    Attends Religious Services: More than 4 times per year    Active Member of Clubs or Organizations: No    Attends Banker Meetings: Never    Marital Status: Married    Tobacco Counseling Counseling given: Not Answered   Clinical Intake:  Pre-visit preparation completed: Yes  Pain : No/denies pain     Nutritional Risks: None Diabetes: No  How often do you need to have someone help you when you read instructions, pamphlets, or other written materials from your doctor or pharmacy?: 1 - Never  Interpreter Needed?: No  Information entered by :: NAllen LPN   Activities of Daily Living    10/22/2023    9:41 AM  In your present state of health, do you have any difficulty performing the following activities:  Hearing? 0  Vision? 1  Comment going to eye doctor tomorrow  Difficulty concentrating or making decisions? 0  Walking or climbing stairs? 0  Dressing or bathing? 0  Doing errands, shopping? 0  Preparing Food and eating ? N  Using the Toilet? N  In the past six months, have you accidently leaked urine? Y  Comment chronic  Do you have problems with loss of bowel control? Y  Comment sometimes with cough if on stool softeners or miralax  Managing your Medications? N  Managing your Finances? N  Housekeeping or managing your Housekeeping? N    Patient Care Team: Loyola Mast, MD as PCP - General (Family Medicine) Wendall Stade, MD as PCP - Cardiology (Cardiology) Hilarie Fredrickson, MD (Gastroenterology) Billie Ruddy, OD as Referring Physician (Optometry) Ria Comment, FNP as Nurse Practitioner (Nurse  Practitioner) Gaspar Cola, [facility] (Inactive) as Pharmacist (Pharmacist) Nyoka Cowden, MD as Consulting Physician (Pulmonary Disease) Kathryne Hitch, MD as Consulting Physician (Orthopedic Surgery) Glendale Chard, DO as Consulting Physician (Neurology) [facility]  Indicate any recent Medical Services you may have received from other than [facility] providers in the past year (date may be approximate).     Assessment:   This is a routine wellness examination for [place].  Hearing/Vision screen Hearing Screening - Comments:: Denies hearing issues Vision Screening - Comments:: Regular eye exams, [facility]   Goals Addressed             This Visit's Progress    Patient Stated       10/22/2023, increase exercise       Depression Screen    10/22/2023    9:49 AM 10/17/2023    9:17 AM 04/26/2023    9:43 AM 02/19/2023    9:18 AM 01/18/2023    9:09 AM 10/10/2022    1:21 PM 02/17/2022    8:51 AM  PHQ 2/9 Scores  PHQ - 2 Score 0 0 3 2 0 0 0  PHQ- 9 Score 0  10 11       Fall Risk    10/22/2023    9:48 AM 08/06/2023   12:59 PM 01/18/2023    9:08 AM 10/10/2022    1:19 PM 02/17/2022    8:51 AM  Fall Risk   Falls in the past year? 0 1 1 1  0  Number falls in past yr: 0 0 1 1 0  Injury with Fall? 0 0 1 1 0  Risk for fall due to : Medication side effect  History of fall(s) History of fall(s);Impaired balance/gait;Orthopedic patient No Fall Risks  Follow up Falls prevention discussed;Falls evaluation completed Falls evaluation completed Falls evaluation completed Education provided;Falls prevention discussed Falls evaluation completed    MEDICARE RISK AT HOME: Medicare Risk at Home Any stairs in or around the home?: Yes If so, are there any without handrails?: No Home free of loose throw rugs in walkways, pet beds, electrical cords, etc?: Yes Adequate lighting in your home to reduce risk of falls?: Yes Life alert?: No Use of a cane, walker or w/c?: No Grab bars in the bathroom?: Yes Shower chair or bench in shower?: No Elevated  toilet seat or a handicapped toilet?: Yes  TIMED UP AND GO:  Was the test performed?  No    Cognitive Function:        10/22/2023    9:49 AM 10/10/2022    1:23 PM  6CIT Screen  What Year? 0 points 0 points  What month? 0 points 0 points  What time? 0 points 0 points  Count back from 20 0 points 0 points  Months in reverse 2 points 0 points  Repeat phrase 2 points 0 points  Total Score 4 points 0 points    Immunizations Immunization History  Administered Date(s) Administered   Fluad Quad(high Dose 65+) 09/16/2019   H1N1 01/07/2009   Influenza Whole 09/23/2012   Influenza, High Dose Seasonal PF 10/11/2017   Influenza,inj,Quad PF,6+ Mos 09/28/2015, 08/02/2016, 09/04/2018   Influenza-Unspecified 08/25/2014, 09/06/2020, 10/14/2021   PFIZER(Purple Top)SARS-COV-2 Vaccination 01/09/2020, 02/11/2020   Pneumococcal Conjugate-13 02/18/2014   Pneumococcal Polysaccharide-23 10/22/2012   RSV,unspecified 05/12/2023   Td 10/22/2012   Tdap 09/12/2022   Zoster Recombinant(Shingrix) 12/10/2018, 04/01/2019   Zoster, Live 11/28/2012    TDAP status: Up to date  Flu  Vaccine status: Due, Education has been provided regarding the importance of this vaccine. Advised may receive this vaccine at local pharmacy or Health Dept. Aware to provide a copy of the vaccination record if obtained from local pharmacy or Health Dept. Verbalized acceptance and understanding.  Pneumococcal vaccine status: Up to date  Covid-19 vaccine status: Information provided on how to obtain vaccines.   Qualifies for Shingles Vaccine? Yes   Zostavax completed Yes   Shingrix Completed?: Yes  Screening Tests Health Maintenance  Topic Date Due   INFLUENZA VACCINE  03/24/2024 (Originally 07/26/2023)   MAMMOGRAM  03/12/2024   Medicare Annual Wellness (AWV)  10/21/2024   DTaP/Tdap/Td (3 - Td or Tdap) 09/12/2032   Pneumonia Vaccine 35+ Years old  Completed   DEXA SCAN  Completed   Hepatitis C Screening  Completed    Zoster Vaccines- Shingrix  Completed   HPV VACCINES  Aged Out   COVID-19 Vaccine  Discontinued   Fecal DNA (Cologuard)  Discontinued    Health Maintenance  There are no preventive care reminders to display for this patient.   Colorectal cancer screening: No longer required.   Mammogram status: No longer required due to age.  Bone Density status: Completed 12/21/2020.   Lung Cancer Screening: (Low Dose CT Chest recommended if Age 42-80 years, 20 pack-year currently smoking OR have quit w/in 15years.) does not qualify.   Lung Cancer Screening Referral: no  Additional Screening:  Hepatitis C Screening: does qualify; Completed 01/17/2016  Vision Screening: Recommended annual ophthalmology exams for early detection of glaucoma and other disorders of the eye. Is the patient up to date with their annual eye exam?  Yes  Who is the provider or what is the name of the office in which the patient attends annual eye exams? [facility] If pt is not established with a provider, would they like to be referred to a provider to establish care? No .   Dental Screening: Recommended annual dental exams for proper oral hygiene  Diabetic Foot Exam: n/a  Community Resource Referral / Chronic Care Management: CRR required this visit?  No   CCM required this visit?  No     Plan:     I have personally reviewed and noted the following in the patient's chart:   Medical and social history Use of alcohol, tobacco or illicit drugs  Current medications and supplements including opioid prescriptions. Patient is not currently taking opioid prescriptions. Functional ability and status Nutritional status Physical activity Advanced directives List of other physicians Hospitalizations, surgeries, and ER visits in previous 12 months Vitals Screenings to include cognitive, depression, and falls Referrals and appointments  In addition, I have reviewed and discussed with patient certain preventive  protocols, quality metrics, and best practice recommendations. A written personalized care plan for preventive services as well as general preventive health recommendations were provided to patient.     Barb Merino, LPN   [id number]   After Visit Summary: (MyChart) Due to this being a telephonic visit, the after visit summary with patients personalized plan was offered to patient via MyChart   Nurse Notes: none

## 2023-10-23 DIAGNOSIS — H04123 Dry eye syndrome of bilateral lacrimal glands: Secondary | ICD-10-CM | POA: Diagnosis not present

## 2023-10-23 DIAGNOSIS — Z961 Presence of intraocular lens: Secondary | ICD-10-CM | POA: Diagnosis not present

## 2023-10-23 DIAGNOSIS — H40051 Ocular hypertension, right eye: Secondary | ICD-10-CM | POA: Diagnosis not present

## 2023-10-23 DIAGNOSIS — H33312 Horseshoe tear of retina without detachment, left eye: Secondary | ICD-10-CM | POA: Diagnosis not present

## 2023-10-29 ENCOUNTER — Other Ambulatory Visit: Payer: Medicare Other

## 2023-11-06 ENCOUNTER — Encounter: Payer: Self-pay | Admitting: Adult Health

## 2023-11-06 ENCOUNTER — Ambulatory Visit: Payer: Medicare Other

## 2023-11-06 ENCOUNTER — Ambulatory Visit: Payer: Medicare Other | Admitting: Adult Health

## 2023-11-06 VITALS — BP 126/84 | HR 86 | Temp 98.2°F | Ht 60.0 in | Wt 158.2 lb

## 2023-11-06 DIAGNOSIS — G4733 Obstructive sleep apnea (adult) (pediatric): Secondary | ICD-10-CM

## 2023-11-06 DIAGNOSIS — J309 Allergic rhinitis, unspecified: Secondary | ICD-10-CM | POA: Diagnosis not present

## 2023-11-06 DIAGNOSIS — R058 Other specified cough: Secondary | ICD-10-CM | POA: Diagnosis not present

## 2023-11-06 DIAGNOSIS — I700 Atherosclerosis of aorta: Secondary | ICD-10-CM | POA: Diagnosis not present

## 2023-11-06 DIAGNOSIS — R053 Chronic cough: Secondary | ICD-10-CM | POA: Diagnosis not present

## 2023-11-06 DIAGNOSIS — Z96612 Presence of left artificial shoulder joint: Secondary | ICD-10-CM | POA: Diagnosis not present

## 2023-11-06 MED ORDER — FLUTICASONE-SALMETEROL 100-50 MCG/ACT IN AEPB: 1.0000 | INHALATION_SPRAY | Freq: Two times a day (BID) | RESPIRATORY_TRACT | 5 refills | Status: DC

## 2023-11-06 NOTE — Assessment & Plan Note (Addendum)
Chronic upper airway cough syndrome-PFTs  with no airflow obstruction or restriction.  High-resolution CT chest negative for ILD. Will give a trial of Advair inhaler to see if maybe a component of reactive airway/mild intermittent asthma Continue on cough control regimen Check chest x-ray today  Plan  Patient Instructions  Trial of Advair 1 puff Twice daily , rinse after use.  Mucinex Twice daily  As needed  cough/congestion  Delsym 2 tsp Twice daily  for cough As needed   Tessalon Three times a day  for cough as needed.  Pepcid 20mg  At bedtime As needed   Zyrtec 10mg  At bedtime  As needed   Saline nasal Twice daily   Saline nasal gel At bedtime  .  Albuterol inhaler 1-2 puffs every 6hrs as needed for cough /wheezing .  Avoid throat clearing , use sips of water to help with cough and throat clearing . May try honey to soothe cough .  NO MINTS .  Chest xray today   Restart CPAP at bedtime try to wear all night .  Work on healthy weight loss Do not drive if sleepy .  Follow up with Dr. Sherene Sires  or Zahara Rembert NP in 3 months and As needed   Please contact office for sooner follow up if symptoms do not improve or worsen or seek emergency care

## 2023-11-06 NOTE — Progress Notes (Signed)
@Patient  ID: Gloria Werner, female    DOB: [birth year], 82 y.o.   MRN: [id number]  Chief Complaint  Patient presents with   Follow-up  Discussed the use of AI scribe software for clinical note transcription with the patient, who gave verbal consent to proceed.  Referring provider: Loyola Mast, MD  HPI: 82 year old female never smoker followed for chronic cough and obstructive sleep apnea  TEST/EVENTS :  Mild obstructive sleep apnea occurred during this study (AHI = 8.3/h). - No significant central sleep apnea occurred during this study (CAI = 1.8/h). - Mild oxygen desaturation was noted during this study (Min O2 = 84.0%).   Spirometry 2020 normal .    HRCT chest 08/2019 -Neg for ILD  mild air trapping .   PFTs February 14, 2022 showed no airflow obstruction or restriction.  FEV1 at 149%, ratio 85, FVC 129%, DLCO 122%  11/06/2023 Follow up: Chronic cough and OSA  Patient presents for a follow-up visit.  Last seen May 2023. She is followed for chronic cough and OSA .  Patient says overall cough is doing about the same.  Aches seems to come and go.  Has episodes where she can cough up some mucus.  Has no associated hemoptysis, fever or discolored mucus.  She did see any increased albuterol use.  She underwent a hip and shoulder replacement since the last visit.  Feels that she is recovering fairly well.  Is glad that her pain is much better controlled.  She does have obstructive sleep apnea.  Previously has been on CPAP.  Says that she stopped wearing her CPAP a few months ago.  Wants to get restarted.  Says that her snoring is very disruptive to her sleep and her spouse asleep.  We discussed getting back on her CPAP and wearing it every single night.      Allergies  Allergen Reactions   Lexapro [Escitalopram] Diarrhea and Other (See Comments)    headache   Ace Inhibitors Cough   Codeine Nausea And Vomiting    Immunization History  Administered Date(s) Administered    Fluad Quad(high Dose 65+) 09/16/2019   H1N1 01/07/2009   Influenza Whole 09/23/2012   Influenza, High Dose Seasonal PF 10/11/2017   Influenza,inj,Quad PF,6+ Mos 09/28/2015, 08/02/2016, 09/04/2018   Influenza-Unspecified 08/25/2014, 09/06/2020, 10/14/2021   PFIZER(Purple Top)SARS-COV-2 Vaccination 01/09/2020, 02/11/2020   Pneumococcal Conjugate-13 02/18/2014   Pneumococcal Polysaccharide-23 10/22/2012   RSV,unspecified 05/12/2023   Td 10/22/2012   Tdap 09/12/2022   Zoster Recombinant(Shingrix) 12/10/2018, 04/01/2019   Zoster, Live 11/28/2012    Past Medical History:  Diagnosis Date   Allergic rhinitis due to pollen    Cancer (HCC)    skin   Cataract    NS OU   Complication of anesthesia    Coronary artery disease    Cough    De Quervain's syndrome (tenosynovitis) 07/03/2018   Gastroesophageal reflux disease without esophagitis    Heart murmur    Hypertension    Irritable bowel syndrome    Lichen simplex chronicus 09/2004   PONV (postoperative nausea and vomiting)    Unilateral primary osteoarthritis, 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 30 tablet 0  albuterol (VENTOLIN HFA) 108 (90 Base) MCG/ACT inhaler Inhale 1-2 puffs into the lungs every 6 (six) hours as needed. 8 g 2   amLODipine (NORVASC) 5 MG tablet Take 1 tablet (5 mg total) by mouth daily. 90 tablet 3   Biotin 72536 MCG TABS Take 10,000 mcg by mouth daily.     buPROPion (WELLBUTRIN XL) 150 MG 24 hr tablet TAKE 1 TABLET BY MOUTH ONCE DAILY IN THE MORNING 90 tablet 3   calcium carbonate (OSCAL) 1500 (600 Ca) MG TABS tablet Take 600 mg of elemental calcium by mouth daily with breakfast.      Carboxymethylcellul-Glycerin (LUBRICATING EYE DROPS OP) Place 1 drop into both eyes daily as needed (dry eyes). Visine     Docusate Calcium (STOOL SOFTENER PO) Take 1 tablet by mouth daily.     ezetimibe (ZETIA) 10 MG tablet Take 1 tablet (10 mg total) by mouth daily. 90 tablet 3   losartan (COZAAR) 25 MG tablet Take 1 tablet (25 mg total) by mouth daily. 90 tablet 3   polyethylene glycol (MIRALAX / GLYCOLAX) 17 g packet Take 17 g by mouth daily. Takes an extra scoop in the evening as needed for moderate-severe constipation.     pyridOXINE (VITAMIN B-6) 100 MG tablet Take 100 mg by mouth daily.     saccharomyces boulardii (FLORASTOR) 250 MG capsule Take 2 capsules (500 mg total) by mouth 2 (two) times daily. (Patient taking differently: Take 250 mg by mouth daily.) 120 capsule 3   triamcinolone (NASACORT ALLERGY 24HR) 55 MCG/ACT AERO nasal inhaler Place 1 spray into the nose daily.     vitamin C (ASCORBIC ACID) 500 MG tablet Take 500 mg by mouth daily.     Vitamin D, Ergocalciferol, (DRISDOL) 1.25 MG (50000 UNIT) CAPS capsule Take 1 capsule (50,000 Units total) by mouth every 7 (seven) days. 12 capsule 3   No facility-administered medications prior to visit.     Review of Systems:   Constitutional:   No  weight loss, night sweats,  Fevers, chills, +fatigue, or  lassitude.  HEENT:   No headaches,  Difficulty swallowing,  Tooth/dental problems, or  Sore throat,                No sneezing, itching, ear ache, nasal congestion, post nasal drip,   CV:  No chest pain,  Orthopnea, PND, swelling in lower extremities, anasarca, dizziness, palpitations, syncope.   GI  No heartburn, indigestion, abdominal pain, nausea, vomiting, diarrhea, change in bowel habits, loss of appetite, bloody stools.   Resp: Skin: no rash or lesions.  GU: no dysuria, change in color of urine, no urgency or frequency.  No flank pain, no hematuria   MS:  No joint pain or swelling.  No decreased range of motion.  No back  pain.    Physical Exam  BP 126/84 (BP Location: Left Arm, Patient Position: Sitting, Cuff Size: Normal)   Pulse 86   Temp 98.2 F (36.8 C) (Oral)   Ht 5' (1.524 m)   Wt 158 lb 3.2 oz (71.8 kg)   LMP 12/25/1986 (Approximate)   SpO2 98%   BMI 30.90 kg/m   GEN: A/Ox3; pleasant , NAD, well nourished    HEENT:  [place]/AT,   NOSE-clear, THROAT-clear, no lesions, no postnasal drip or exudate noted.   NECK:  Supple w/ fair ROM; no JVD; normal carotid impulses w/o bruits; no thyromegaly or nodules palpated; no lymphadenopathy.    RESP  Clear  P & A; w/o, wheezes/  rales/ or rhonchi. no accessory muscle use, no dullness to percussion  CARD:  RRR, no m/r/g, no peripheral edema, pulses intact, no cyanosis or clubbing.  GI:   Soft & nt; nml bowel sounds; no organomegaly or masses detected.   Musco: Warm bil, no deformities or joint swelling noted.   Neuro: alert, no focal deficits noted.    Skin: Warm, no lesions or rashes    Lab Results:  CBC  Imaging:  Administration History     None          Latest Ref Rng & Units 02/14/2022    8:58 AM 11/27/2018   10:53 AM  PFT Results  FVC-Pre L 2.87  3.04   FVC-Predicted Pre % 131  129   FVC-Post L 2.84  3.02   FVC-Predicted Post % 129  128   Pre FEV1/FVC % % 83  80   Post FEV1/FCV % % 85  85   FEV1-Pre L 2.37  2.44   FEV1-Predicted Pre % 146  139   FEV1-Post L 2.42  2.56   DLCO uncorrected ml/min/mmHg 20.00  20.78   DLCO UNC% % 122  112   DLCO corrected ml/min/mmHg 20.00  20.91   DLCO COR %Predicted % 122  112   DLVA Predicted % 102  104   TLC L 5.37  5.36   TLC % Predicted % 121  121   RV % Predicted % 116  117     Lab Results  Component Value Date   NITRICOXIDE 13 10/01/2018        Assessment & Plan:   Upper airway cough syndrome Chronic upper airway cough syndrome-PFTs  with no airflow obstruction or restriction.  High-resolution CT chest negative for ILD. Will give a trial of Advair inhaler to see if maybe  a component of reactive airway/mild intermittent asthma Continue on cough control regimen Check chest x-ray today  Plan  Patient Instructions  Trial of Advair 1 puff Twice daily , rinse after use.  Mucinex Twice daily  As needed  cough/congestion  Delsym 2 tsp Twice daily  for cough As needed   Tessalon Three times a day  for cough as needed.  Pepcid 20mg  At bedtime As needed   Zyrtec 10mg  At bedtime  As needed   Saline nasal Twice daily   Saline nasal gel At bedtime  .  Albuterol inhaler 1-2 puffs every 6hrs as needed for cough /wheezing .  Avoid throat clearing , use sips of water to help with cough and throat clearing . May try honey to soothe cough .  NO MINTS .  Chest xray today   Restart CPAP at bedtime try to wear all night .  Work on healthy weight loss Do not drive if sleepy .  Follow up with Dr. Sherene Sires  or Sanjana Folz NP in 3 months and As needed   Please contact office for sooner follow up if symptoms do not improve or worsen or seek emergency care      OSA (obstructive sleep apnea) Restart CPAP.  Patient with significant symptom burden snoring and restless sleep.  Recommend restarting CPAP wear all night long.  Follow-up in 3 months  Allergic rhinitis Continue on current regimen     Rubye Oaks, NP 11/06/2023

## 2023-11-06 NOTE — Patient Instructions (Addendum)
Trial of Advair 1 puff Twice daily , rinse after use.  Mucinex Twice daily  As needed  cough/congestion  Delsym 2 tsp Twice daily  for cough As needed   Tessalon Three times a day  for cough as needed.  Pepcid 20mg  At bedtime As needed   Zyrtec 10mg  At bedtime  As needed   Saline nasal Twice daily   Saline nasal gel At bedtime  .  Albuterol inhaler 1-2 puffs every 6hrs as needed for cough /wheezing .  Avoid throat clearing , use sips of water to help with cough and throat clearing . May try honey to soothe cough .  NO MINTS .  Chest xray today   Restart CPAP at bedtime try to wear all night .  Work on healthy weight loss Do not drive if sleepy .  Follow up with Gloria Werner  or Gloria Binns NP in 3 months and As needed   Please contact office for sooner follow up if symptoms do not improve or worsen or seek emergency care

## 2023-11-06 NOTE — Assessment & Plan Note (Signed)
Continue on current regimen .   

## 2023-11-06 NOTE — Assessment & Plan Note (Signed)
Restart CPAP.  Patient with significant symptom burden snoring and restless sleep.  Recommend restarting CPAP wear all night long.  Follow-up in 3 months

## 2023-11-20 DIAGNOSIS — H26491 Other secondary cataract, right eye: Secondary | ICD-10-CM | POA: Diagnosis not present

## 2023-11-26 DIAGNOSIS — H26492 Other secondary cataract, left eye: Secondary | ICD-10-CM | POA: Diagnosis not present

## 2023-12-17 DIAGNOSIS — K5900 Constipation, unspecified: Secondary | ICD-10-CM | POA: Diagnosis not present

## 2024-01-28 ENCOUNTER — Other Ambulatory Visit: Payer: Self-pay | Admitting: Family Medicine

## 2024-01-28 DIAGNOSIS — Z1231 Encounter for screening mammogram for malignant neoplasm of breast: Secondary | ICD-10-CM

## 2024-02-10 NOTE — Progress Notes (Deleted)
 Gloria Werner, female    DOB: 09-Sep-1941,     MRN: [id number]   Brief patient profile:  36 yowf never smoker but bronchitis as child in house with smokers self referred to pulmonary clinic 08/16/2021 for second opinion re cough onset May 2019 already eval by Allergy Gloria Werner) then Pulmonary (Gloria Werner) refractory to gerd  rx and symbicort but this may have been while still on ACEi  Onset while on lisinopril stopped 07/09/2019     History of Present Illness  08/16/2021  Pulmonary/ 1st office eval/Gloria Werner  Chief Complaint  Patient presents with   Follow-up    Patient reports has some improvement with productive cough,.   Dyspnea:  Not limited by breathing from desired activities   Cough: pattern mostly day sporadic/worse with voice use, uses mints and cough drops  Sleep: not bothered by cough /mouth wakes up dry p cpap started  SABA use: none  Rec    02/12/2024  f/u ov/Gloria Werner re: ***   maint on ***  No chief complaint on file.   Dyspnea:  *** Cough: *** Sleeping: *** resp cc  SABA use: *** 02: ***  Lung cancer screening :  ***    No obvious day to day or daytime variability or assoc excess/ purulent sputum or mucus plugs or hemoptysis or cp or chest tightness, subjective wheeze or overt sinus or hb symptoms.    Also denies any obvious fluctuation of symptoms with weather or environmental changes or other aggravating or alleviating factors except as outlined above   No unusual exposure hx or h/o childhood pna/ asthma or knowledge of premature birth.  Current Allergies, Complete Past Medical History, Past Surgical History, Family History, and Social History were reviewed in [employer] record.  ROS  The following are not active complaints unless bolded Hoarseness, sore throat, dysphagia, dental problems, itching, sneezing,  nasal congestion or discharge of excess mucus or purulent secretions, ear ache,   fever, chills, sweats, unintended wt loss or wt gain,  classically pleuritic or exertional cp,  orthopnea pnd or arm/hand swelling  or leg swelling, presyncope, palpitations, abdominal pain, anorexia, nausea, vomiting, diarrhea  or change in bowel habits or change in bladder habits, change in stools or change in urine, dysuria, hematuria,  rash, arthralgias, visual complaints, headache, numbness, weakness or ataxia or problems with walking or coordination,  change in mood or  memory.        No outpatient medications have been marked as taking for the 02/12/24 encounter (Appointment) with Gloria Cowden, MD.                     Past Medical History:  Diagnosis Date   Allergic rhinitis due to pollen    Cataract    NS OU   Complication of anesthesia    Cough    Gastroesophageal reflux disease without esophagitis    Irritable bowel syndrome    Lichen simplex chronicus 10/05   PONV (postoperative nausea and vomiting)        Objective:    Wts   02/12/2024        ***   11/06/23 158 lb 3.2 oz (71.8 kg)  10/17/23 154 lb 12.8 oz (70.2 kg)  10/15/23 150 lb (68 kg)     Vital signs reviewed  02/12/2024  - Note at rest 02 sats  ***% on ***   General appearance:    ***     CXR PA and Lateral:  08/16/2021 :    I personally reviewed images / radiology impression as follows:    Mild t kyphosis, no acute findings        Assessment

## 2024-02-12 ENCOUNTER — Other Ambulatory Visit: Payer: Self-pay | Admitting: Family Medicine

## 2024-02-12 ENCOUNTER — Ambulatory Visit: Payer: Medicare Other | Admitting: Internal Medicine

## 2024-02-12 DIAGNOSIS — E785 Hyperlipidemia, unspecified: Secondary | ICD-10-CM

## 2024-02-20 ENCOUNTER — Ambulatory Visit (HOSPITAL_COMMUNITY)
Admission: EM | Admit: 2024-02-20 | Discharge: 2024-02-20 | Disposition: A | Discharge status: Home / Self Care | Payer: Medicare Other | Attending: Psychiatry | Admitting: Psychiatry

## 2024-02-20 DIAGNOSIS — Z79899 Other long term (current) drug therapy: Secondary | ICD-10-CM | POA: Diagnosis not present

## 2024-02-20 DIAGNOSIS — F339 Major depressive disorder, recurrent, unspecified: Secondary | ICD-10-CM
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
## 2024-02-20 NOTE — ED Provider Notes (Signed)
 Behavioral Health Urgent Care Medical Screening Exam  Patient Name: Gloria Werner MRN: [id number] Date of Evaluation: 02/20/24 Chief Complaint:  want a stronger medication for depression  Diagnosis:  Final diagnoses:  Medication course changed  Recurrent major depressive disorder, remission status unspecified (HCC)    History of Present illness: Gloria Werner is a 83 y.o. female. With a history of depression,  presented to [facility] voluntarily accompanied by her husband.  Per the patient she has been taking Wellbutrin for a couple years but she does not feel it is working anymore because she stopped taking it a week ago.  According to the patient she has been having decreased appetite and she also stopped taking her blood pressure medicine because she was having a cough.  Patient lives with her husband, currently sees a PCP who is prescribing her Wellbutrin.  Writer discussed with patient that she need to reach back out to her PCP and let him know that the medicine is not effective so he can either prescriber a new medicine or formulate a new plan of care.   Face-to-face observation of patient, patient is alert and oriented x 4, speech is clear, maintaining eye contact.  Patient denies SI, HI, AVH or paranoia.  Denies alcohol use denies illicit drug use denies smoking.  Patient does not appear to be in any distress at this time.  Does not appear to be influenced by any external or internal stimuli.  Patient denies ever being hospitalized for any psychiatric problems. Writer discussed with patient the need to reach out to her PCP to let him know that the medicine is not effective so he can formulate the new plan of care for her.  Discussed with patient that given the fact that she is seeing a provider she needs to reach out to him because changing her medicines and we do not know exactly how many other medicines that she is on can cause medication interaction.   At the time of this  assessment patient was both medically and psychologically cleared for discharge.  Writer discussed with patient that should she experience any suicidal ideation homicidal thoughts hallucination she should call 911 or reach out to the nearest ED for assistance.  Patient verbalized understanding.  Recommend discharge for patient to follow up with her PCP.  Flowsheet Row ED from 02/20/2024 in [facility] ED from 10/15/2023 in [facility] Emergency Department at [facility] ED from 02/14/2023 in [facility] Emergency Department at [facility]  C-SSRS RISK CATEGORY No Risk No Risk No Risk       Psychiatric Specialty Exam  Presentation  General Appearance:Casual  Eye Contact:Good  Speech:Clear and Coherent  Speech Volume:Normal  Handedness:Right   Mood and Affect  Mood: Depressed  Affect: Congruent   Thought Process  Thought Processes: Coherent  Descriptions of Associations:Intact  Orientation:Full (Time, Place and Person)  Thought Content:Logical    Hallucinations:None  Ideas of Reference:None  Suicidal Thoughts:No  Homicidal Thoughts:No   Sensorium  Memory: Immediate Fair  Judgment: Fair  Insight: Fair   Art therapist  Concentration: Good  Attention Span: Good  Recall: Good  Fund of Knowledge: Good  Language: Good   Psychomotor Activity  Psychomotor Activity: Normal   Assets  Assets: Communication Skills   Sleep  Sleep: Fair  Number of hours:  8   Physical Exam: Physical Exam HENT:     Head: Normocephalic.     Nose: Nose normal.  Eyes:  Pupils: Pupils are equal, round, and reactive to light.  Cardiovascular:     Rate and Rhythm: Normal rate.  Pulmonary:     Effort: Pulmonary effort is normal.  Musculoskeletal:        General: Normal range of motion.     Cervical back: Normal range of motion.  Neurological:     General: No focal deficit present.     Mental  Status: She is alert.  Psychiatric:        Mood and Affect: Mood normal.        Thought Content: Thought content normal.   Review of Systems  Constitutional: Negative.   HENT: Negative.    Eyes: Negative.   Respiratory: Negative.    Cardiovascular: Negative.   Gastrointestinal: Negative.   Genitourinary: Negative.   Musculoskeletal: Negative.   Skin: Negative.   Neurological: Negative.   Psychiatric/Behavioral:  Positive for depression. The patient is nervous/anxious.    Blood pressure (!) 152/100, pulse (!) 103, temperature 98.4 F (36.9 C), temperature source Oral, resp. rate 16, last menstrual period 12/25/1986, SpO2 99%. There is no height or weight on file to calculate BMI.  Musculoskeletal: Strength & Muscle Tone: within normal limits Gait & Station: normal Patient leans: N/A   [facility] MSE Discharge Disposition for Follow up and Recommendations: Based on my evaluation the patient does not appear to have an emergency medical condition and can be discharged with resources and follow up care in outpatient services for Medication Management   Sindy Guadeloupe, NP 02/20/2024, 8:26 PM    Isa Rankin, MD 02/26/24 1759

## 2024-02-20 NOTE — Discharge Instructions (Addendum)
 F/u with PCP.

## 2024-02-25 ENCOUNTER — Inpatient Hospital Stay: Payer: Medicare Other | Admitting: Internal Medicine

## 2024-02-27 ENCOUNTER — Ambulatory Visit (INDEPENDENT_AMBULATORY_CARE_PROVIDER_SITE_OTHER): Admitting: Internal Medicine

## 2024-02-27 ENCOUNTER — Encounter: Payer: Self-pay | Admitting: Internal Medicine

## 2024-02-27 ENCOUNTER — Telehealth: Payer: Self-pay | Admitting: Family Medicine

## 2024-02-27 VITALS — BP 160/98 | HR 80 | Temp 97.8°F | Ht 60.0 in | Wt 153.2 lb

## 2024-02-27 DIAGNOSIS — F32A Depression, unspecified: Secondary | ICD-10-CM | POA: Diagnosis not present

## 2024-02-27 DIAGNOSIS — R053 Chronic cough: Secondary | ICD-10-CM | POA: Diagnosis not present

## 2024-02-27 MED ORDER — BUPROPION HCL ER (XL) 300 MG PO TB24: 300.0000 mg | ORAL_TABLET | Freq: Every day | ORAL | 1 refills | Status: DC

## 2024-02-27 NOTE — Telephone Encounter (Signed)
 Copied from CRM [id number]. Topic: General - Other >> Feb 27, 2024 11:47 AM Gloria Werner wrote: Reason for CRM: Patient was just seen by the provider today(03/05/205) and requested a list of therapists or counselors. The patient left the list at the clinic and has requested for the list to be emailed to her. Email: [email or web address]

## 2024-02-27 NOTE — Telephone Encounter (Signed)
 Info provided on AVS summary - see document

## 2024-02-27 NOTE — Telephone Encounter (Signed)
 Eft detailed VM that information was on her after visit summary that she was given. Dm/cma

## 2024-02-27 NOTE — Patient Instructions (Signed)
Counseling and Mental Health Resources   [facility]  - For Women and Girls only - Cost based upon sliding scale of income - Financial Aid available  [phone] [address], [address] [place], [place] [address] [facility]  - Mental Health, Substance Abuse Treatment - IV Ketamine, Hydration and Weight Loss Programs - Center for Treatment for Resistant Depression and Suicidal Ideation  [address] [place], [place] [address]  [phone] [email or web address]   [facility]  - Individual and Family Counseling - Assessments and Therapy for Learning Disabilities, ADHD, Autism Spectrum Disorder, Processing Deficits  [phone] [address], [address] [place], [place] [address]  [facility]  [address] [place], [place] [address]  [phone]  [facility]  - Individual, Family, Play and Group Therapy - In person and telehealth sessions available  Phone: [phone] Email: [email or web address]  [place] Location:   [address] [place], [place] [address]   [place] Location:   [address] [place], [place] [address] [facility]  [address] (Inside old [address]) [place], [place] [address]  [phone]  [facility], [facility]  Adult, Adolescent and [facility]  [address], [place] [address]  Text:  [phone]   Call:  [phone] Email: [email or web address] Su Ley MA Clinical Psychology  [address] [place] [address]  [phone]  [facility]  - Individual, Group Therapy - Day Programs, Wellness Coaching - Staff Programming, Workshops  [address], [place], [place] [address]  [phone]  [facility] [place]  Grief and Trauma Counseling    [facility]  - Indivudual Counseling, Enneagram Therapy [address] [place], [place] [address]  [phone] [facility]  - Children, Adolescent, Adult and Family Therapy  [place] Location [phone]  [address] [place], [place] [address]   [place] Location [phone]  [address] [place] [address]    [facility]  Counseling offered by Art therapist Students  - Majority of Patients qualify for financial assistance   [address] [place], [place] [address]  [phone]   [facility]  -Services at "less than a basic fee" sponsored by [facility]  [address] [place], [place] [address]  [phone]

## 2024-02-27 NOTE — Progress Notes (Signed)
 [facility] [address] [place] [address] Dept: [phone] Dept Fax: [phone]    Subjective:   Gloria Werner Aug 22, 1941 02/27/2024  Chief Complaint  Patient presents with   Hospitalization Follow-up    Discuss medication    HPI: Gloria Werner presents today for re-assessment and management of chronic medical conditions.  Discussed the use of AI scribe software for clinical note transcription with the patient, who gave verbal consent to proceed.  History of Present Illness   The patient, with a history depression, presents with a chronic cough and worsening depression. The cough has been persistent for about five years, around the same time she started Lisinopril for hypertension. Despite changing her blood pressure medication, the cough persisted. The cough is described as a significant aggravation in her life, with no clear triggers identified. She has been evaluated by GI, ENT, allergy/asthma with no clear cause of cough. The patient also reports a recent worsening of her depression. She has been on Wellbutrin for several years, which she reports has been mostly helpful. However, she recently stopped taking the medication for about a week, during which she experienced a significant downward spiral in her mood. She has resumed taking her bupropion but feels the dose is not working as well as it has in the past. She was recently seen in the [facility] due to worsening depression, but was instructed to follow up with PCP.       The following portions of the patient's history were reviewed and updated as appropriate: past medical history, past surgical history, family history, social history, allergies, medications, and problem list.   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/04/2021   DDD (degenerative disc disease), lumbar 09/28/2019   Aortic atherosclerosis (HCC) 09/28/2019   Gastroesophageal reflux disease without esophagitis 02/04/2019   OSA (obstructive sleep apnea) 01/27/2019   Pulmonary nodule 01/27/2019   Allergic rhinitis 11/06/2018   Upper airway cough syndrome 09/24/2018   Primary osteoarthritis of first carpometacarpal joint of left hand 07/03/2018   Essential hypertension 06/03/2018   Anxiety and depression 01/17/2017   Vitamin D insufficiency 01/17/2017   Osteopenia 04/08/2015   Irritable bowel syndrome (IBS) 02/03/2008   Past Medical History:  Diagnosis Date   Allergic rhinitis due to pollen    Cancer [facility])    skin   Cataract    NS OU   Complication of anesthesia    Coronary artery disease    Cough    De Quervain's syndrome (tenosynovitis) 07/03/2018   Gastroesophageal reflux disease without esophagitis    Heart murmur    Hypertension    Irritable bowel syndrome    Lichen simplex chronicus 09/2004   PONV (postoperative nausea and vomiting)    Unilateral primary osteoarthritis, 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   Past Surgical History:  Procedure Laterality Date   ABDOMINAL HYSTERECTOMY  1988   secondary to prolapse   BLADDER SUSPENSION     A-P with Hyst   CATARACT EXTRACTION Bilateral    INSERTION OF MESH  10/16/2018   Procedure: INSERTION OF MESH;  Surgeon: Crist Fat, MD;  Location: [facility];  Service: Urology;;   NASAL SINUS SURGERY  1970  REVERSE SHOULDER ARTHROPLASTY Left 06/08/2022   Procedure: REVERSE SHOULDER ARTHROPLASTY;  Surgeon: Francena Hanly, MD;  Location: [facility];  Service: Orthopedics;  Laterality: Left;   ROBOTIC ASSISTED LAPAROSCOPIC SACROCOLPOPEXY N/A 10/16/2018   Procedure: XI ROBOTIC ASSISTED LAPAROSCOPIC SACROCOLPOPEXY;  Surgeon: Crist Fat, MD;  Location: [facility];  Service: Urology;  Laterality: N/A;   TONSILLECTOMY     TOTAL HIP ARTHROPLASTY Right 11/04/2021   Procedure: RIGHT TOTAL HIP ARTHROPLASTY ANTERIOR APPROACH;  Surgeon: Kathryne Hitch, MD;  Location[facility];  Service: Orthopedics;  Laterality: Right;   Family History  Problem Relation Age of Onset   Emphysema Mother    Lymphoma Mother    Asthma Mother    Cancer Father 26       lung cancer   Coronary artery disease Brother    Cancer Brother        breast cancer   Breast cancer Brother    Cancer Brother        lung cancer   Diabetes Neg Hx    Hypertension Neg Hx    Colon cancer Neg Hx    Esophageal cancer Neg Hx    Rectal cancer Neg Hx    Stomach cancer Neg Hx     Current Outpatient Medications:    acetaminophen (TYLENOL) 500 MG tablet, Take 1-2 tablets (500-1,000 mg total) by mouth at bedtime as needed for moderate pain., Disp: 30 tablet, Rfl: 0   albuterol (VENTOLIN HFA) 108 (90 Base) MCG/ACT inhaler, Inhale 1-2 puffs into the lungs every 6 (six) hours as needed., Disp: 8 g, Rfl: 2   amLODipine (NORVASC) 5 MG tablet, Take 1 tablet (5 mg total) by mouth daily., Disp: 90 tablet, Rfl: 3   Biotin 04540 MCG TABS, Take 10,000 mcg by mouth daily., Disp: , Rfl:    buPROPion (WELLBUTRIN XL) 300 MG 24 hr tablet, Take 1 tablet (300 mg total) by mouth daily., Disp: 90 tablet, Rfl: 1   calcium carbonate (OSCAL) 1500 (600 Ca) MG TABS tablet, Take 600 mg of elemental calcium by mouth daily with breakfast., Disp: , Rfl:    Carboxymethylcellul-Glycerin (LUBRICATING EYE DROPS OP), Place 1 drop into both eyes daily. Visine, Disp: , Rfl:    Docusate Calcium (STOOL SOFTENER PO), Take 1 tablet by mouth daily., Disp: , Rfl:    ezetimibe (ZETIA) 10 MG tablet, Take 1 tablet (10 mg total) by mouth daily. Needs appointment for further refills., Disp: 90 tablet, Rfl: 0   fluticasone-salmeterol (ADVAIR DISKUS) 100-50 MCG/ACT AEPB, Inhale 1 puff into the lungs 2 (two)  times daily., Disp: 1 each, Rfl: 5   polyethylene glycol (MIRALAX / GLYCOLAX) 17 g packet, Take 17 g by mouth daily. Takes an extra scoop in the evening as needed for moderate-severe constipation., Disp: , Rfl:    pyridOXINE (VITAMIN B-6) 100 MG tablet, Take 100 mg by mouth daily., Disp: , Rfl:    saccharomyces boulardii (FLORASTOR) 250 MG capsule, Take 2 capsules (500 mg total) by mouth 2 (two) times daily. (Patient taking differently: Take 250 mg by mouth daily.), Disp: 120 capsule, Rfl: 3   triamcinolone (NASACORT ALLERGY 24HR) 55 MCG/ACT AERO nasal inhaler, Place 1 spray into the nose daily., Disp: , Rfl:    vitamin C (ASCORBIC ACID) 500 MG tablet, Take 500 mg by mouth daily., Disp: , Rfl:    Vitamin D, Ergocalciferol, (DRISDOL) 1.25 MG (50000 UNIT) CAPS capsule, Take 1 capsule (50,000 Units total) by mouth every 7 (seven) days., Disp: 12 capsule, Rfl:  3   losartan (COZAAR) 25 MG tablet, Take 1 tablet (25 mg total) by mouth daily., Disp: 90 tablet, Rfl: 3 Allergies  Allergen Reactions   Lexapro [Escitalopram] Diarrhea and Other (See Comments)    headache   Ace Inhibitors Cough   Codeine Nausea And Vomiting     ROS: A complete ROS was performed with pertinent positives/negatives noted in the HPI. The remainder of the ROS are negative.    Objective:   Today's Vitals   02/27/24 1033  BP: (!) 160/98  Pulse: 80  Temp: 97.8 F (36.6 C)  TempSrc: Temporal  SpO2: 98%  Weight: 153 lb 3.2 oz (69.5 kg)  Height: 5' (1.524 m)    GENERAL: Well-appearing, in NAD. Well nourished.  SKIN: Pink, warm and dry.  RESPIRATORY: Chest wall symmetrical. Respirations even and non-labored. Breath sounds clear to auscultation bilaterally.  CARDIAC: S1, S2 present, regular rate and rhythm. Peripheral pulses 2+ bilaterally.  NEUROLOGIC:Steady, even gait.  PSYCH/MENTAL STATUS: Alert, oriented x 3. Cooperative, appropriate mood and affect.   There are no preventive care reminders to display for this  patient.  No results found for any visits on 02/27/24.  The ASCVD Risk score (Arnett DK, et al., 2019) failed to calculate for the following reasons:   The 2019 ASCVD risk score is only valid for ages 24 to 66     Assessment & Plan:  Assessment and Plan    Depression Chronic depression with recent exacerbation. Patient was off Wellbutrin for a week, which may have contributed to the depressive episode. Family history of depression noted. Patient has been on Wellbutrin 150mg  for several years. -Increase Wellbutrin to 300mg  daily. -Consider counseling as an adjunct to medication management. Provided a list of local counselors. -Follow-up in 6 weeks to assess response to increased Wellbutrin dose.  Chronic Cough Chronic cough of 5 years duration. Patient has tried acid reflux medications without improvement. Patient has a history of hypertension and is on Lisinopril. No smoking history. -Trial of Albuterol inhaler, 2 puffs in the morning and evening, with additional doses as needed.      No orders of the defined types were placed in this encounter.  No images are attached to the encounter or orders placed in the encounter. Meds ordered this encounter  Medications   buPROPion (WELLBUTRIN XL) 300 MG 24 hr tablet    Sig: Take 1 tablet (300 mg total) by mouth daily.    Dispense:  90 tablet    Refill:  1    Supervising Provider:   Garnette Gunner [[id number]]    Return in about 6 weeks (around 04/09/2024) for Anxiety/Depression.   Salvatore Decent, FNP

## 2024-03-03 ENCOUNTER — Encounter: Payer: Self-pay | Admitting: Internal Medicine

## 2024-03-18 ENCOUNTER — Ambulatory Visit: Payer: Medicare Other

## 2024-04-07 ENCOUNTER — Other Ambulatory Visit: Payer: Self-pay | Admitting: Family Medicine

## 2024-04-07 DIAGNOSIS — Z1231 Encounter for screening mammogram for malignant neoplasm of breast: Secondary | ICD-10-CM

## 2024-04-08 ENCOUNTER — Ambulatory Visit
Admission: RE | Admit: 2024-04-08 | Discharge: 2024-04-08 | Disposition: A | Discharge status: Home / Self Care | Source: Ambulatory Visit

## 2024-04-08 DIAGNOSIS — Z1231 Encounter for screening mammogram for malignant neoplasm of breast: Secondary | ICD-10-CM

## 2024-04-09 ENCOUNTER — Encounter: Payer: Self-pay | Admitting: Family Medicine

## 2024-04-09 ENCOUNTER — Ambulatory Visit (INDEPENDENT_AMBULATORY_CARE_PROVIDER_SITE_OTHER): Admitting: Family Medicine

## 2024-04-09 VITALS — BP 140/82 | HR 78 | Temp 98.2°F | Ht 60.0 in | Wt 150.8 lb

## 2024-04-09 DIAGNOSIS — I10 Essential (primary) hypertension: Secondary | ICD-10-CM | POA: Diagnosis not present

## 2024-04-09 DIAGNOSIS — F32A Depression, unspecified: Secondary | ICD-10-CM

## 2024-04-09 DIAGNOSIS — R5383 Other fatigue: Secondary | ICD-10-CM

## 2024-04-09 DIAGNOSIS — E559 Vitamin D deficiency, unspecified: Secondary | ICD-10-CM

## 2024-04-09 DIAGNOSIS — F419 Anxiety disorder, unspecified: Secondary | ICD-10-CM | POA: Diagnosis not present

## 2024-04-09 DIAGNOSIS — E785 Hyperlipidemia, unspecified: Secondary | ICD-10-CM

## 2024-04-09 DIAGNOSIS — R058 Other specified cough: Secondary | ICD-10-CM | POA: Diagnosis not present

## 2024-04-09 LAB — TSH: TSH: 2.46 u[IU]/mL (ref 0.35–5.50)

## 2024-04-09 LAB — COMPREHENSIVE METABOLIC PANEL WITH GFR
ALT: 17 U/L (ref 0–35)
AST: 22 U/L (ref 0–37)
Albumin: 4.6 g/dL (ref 3.5–5.2)
Alkaline Phosphatase: 57 U/L (ref 39–117)
BUN: 14 mg/dL (ref 6–23)
CO2: 26 meq/L (ref 19–32)
Calcium: 9.3 mg/dL (ref 8.4–10.5)
Chloride: 103 meq/L (ref 96–112)
Creatinine, Ser: 0.99 mg/dL (ref 0.40–1.20)
GFR: 53.91 mL/min — ABNORMAL LOW (ref >60.00)
Glucose, Bld: 104 mg/dL — ABNORMAL HIGH (ref 70–99)
Potassium: 4.3 meq/L (ref 3.5–5.1)
Sodium: 137 meq/L (ref 135–145)
Total Bilirubin: 0.4 mg/dL (ref 0.2–1.2)
Total Protein: 7 g/dL (ref 6.0–8.3)

## 2024-04-09 LAB — CBC
HCT: 41.5 % (ref 36.0–46.0)
Hemoglobin: 13.7 g/dL (ref 12.0–15.0)
MCHC: 33 g/dL (ref 30.0–36.0)
MCV: 90.1 fl (ref 78.0–100.0)
Platelets: 226 10*3/uL (ref 150.0–400.0)
RBC: 4.61 Mil/uL (ref 3.87–5.11)
RDW: 14.1 % (ref 11.5–15.5)
WBC: 4.8 10*3/uL (ref 4.0–10.5)

## 2024-04-09 LAB — VITAMIN D 25 HYDROXY (VIT D DEFICIENCY, FRACTURES): VITD: 63.11 ng/mL (ref 30.00–100.00)

## 2024-04-09 LAB — VITAMIN B12: Vitamin B-12: 431 pg/mL (ref 211–911)

## 2024-04-09 MED ORDER — AMLODIPINE BESYLATE 10 MG PO TABS: 5.0000 mg | ORAL_TABLET | Freq: Every day | ORAL | 3 refills | Status: DC

## 2024-04-09 MED ORDER — EZETIMIBE 10 MG PO TABS: 10.0000 mg | ORAL_TABLET | Freq: Every day | ORAL | 0 refills | Status: DC

## 2024-04-09 MED ORDER — VITAMIN D (ERGOCALCIFEROL) 1.25 MG (50000 UNIT) PO CAPS: 50000.0000 [IU] | ORAL_CAPSULE | ORAL | 3 refills | Status: DC

## 2024-04-09 MED ORDER — SERTRALINE HCL 25 MG PO TABS: 25.0000 mg | ORAL_TABLET | Freq: Every day | ORAL | 3 refills | Status: DC

## 2024-04-09 NOTE — Assessment & Plan Note (Signed)
 Blood pressure is mildly increased today. It is possible that losartan could be contributing to the chronic cough issue. I will stop this and have Gloria Werner increase her amlodipine to 10 mg daily.

## 2024-04-09 NOTE — Assessment & Plan Note (Signed)
 Gloria Werner still feels her mood is not optimally controlled. She should continue bupropion 300 mg Werner. I will try adding sertraline 25 mg Werner. I recommend she get out several days a week to exercise/walk. I also urged her to attend at least 1 social event per month to improve social interactions.

## 2024-04-09 NOTE — Patient Instructions (Signed)
 Stop losartan. Take amlodipine 5 mg, two tablets daily (total dose= 10 mg) until you use up your current supply. I have written a prescription for the 10 mg dose for when you need a refill. Take a walk for 30 minutes, three times a week. Attend at least one social event each month.

## 2024-04-09 NOTE — Addendum Note (Signed)
 Addended by: Graig Lawyer on: 04/09/2024 05:21 PM   Modules accepted: Orders

## 2024-04-09 NOTE — Assessment & Plan Note (Signed)
 I will try stopping her losartan, as ARBs can cause cough in ~ 3% of individuals.

## 2024-04-09 NOTE — Assessment & Plan Note (Signed)
 In light of fatigue, I will reassess her Vitamin D level. I will reassess her level today.

## 2024-04-09 NOTE — Progress Notes (Signed)
 [facility] [address] [place] [address] Dept: [phone] Dept Fax: [phone]  Chronic Care Office Visit  Subjective:    Patient ID: Gloria Werner, female    DOB: [birth year], 83 y.o..   MRN: [id number]  Chief Complaint  Patient presents with   Anxiety    6 week f/u anxiety.    History of Present Illness:  Patient is in today for reassessment of chronic medical issues.  Ms. Garraway has a history of hypertension. She is managed on amlodipine 5 mg daily and losartan 25 mg daily. She has a 5-year history of an upper airway cough syndrome. She continues to have this persistent cough. She relates that this started at a time when she was on an ACE-I (lisinopril). However, stopping this medicine has never resolved her cough.   Ms. Camuso has a history of anxiety with depression. She has noted her depression to be worsening with time. She was seen at [facility] urgent care in late Feb. and followed up with Ms. Izola Price in March. Ms. Izola Price increased her bupropion to 300 mg daily. Ms. Chamberlin feels this has improved her mood over the past 6 weeks, but she does not feel she is back to her baseline. She notes she still feels a lack of energy and motivation. She admits that she does not get out of her home very much. Although she attends church, she is not involved in any church groups/activities. She retired 2 years ago and her social connections fell off after this. She is also not getting out for regular exercise.   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   Diverticulosis 11/16/2021   Dyslipidemia 11/16/2021   History of Clostridium difficile infection 11/16/2021   Status post total replacement of right  hip 11/04/2021   DDD (degenerative disc disease), lumbar 09/28/2019   Aortic atherosclerosis (HCC) 09/28/2019   Gastroesophageal reflux disease without esophagitis 02/04/2019   OSA (obstructive sleep apnea) 01/27/2019   Pulmonary nodule 01/27/2019   Allergic rhinitis 11/06/2018   Upper airway cough syndrome 09/24/2018   Primary osteoarthritis of first carpometacarpal joint of left hand 07/03/2018   Essential hypertension 06/03/2018   Anxiety and depression 01/17/2017   Vitamin D insufficiency 01/17/2017   Osteopenia 04/08/2015   Irritable bowel syndrome (IBS) 02/03/2008   Past Surgical History:  Procedure Laterality Date   ABDOMINAL HYSTERECTOMY  1988   secondary to prolapse   BLADDER SUSPENSION     A-P with Hyst   CATARACT EXTRACTION Bilateral    INSERTION OF MESH  10/16/2018   Procedure: INSERTION OF MESH;  Surgeon: Crist Fat, MD;  [facility]: [facility];  Service: Urology;;   NASAL SINUS SURGERY  1970   REVERSE SHOULDER ARTHROPLASTY Left 06/08/2022   Procedure: REVERSE SHOULDER ARTHROPLASTY;  Surgeon: Francena Hanly, MD;  Location: [facility];  Service: Orthopedics;  Laterality: Left;   ROBOTIC ASSISTED LAPAROSCOPIC SACROCOLPOPEXY N/A 10/16/2018   Procedure: XI ROBOTIC ASSISTED LAPAROSCOPIC SACROCOLPOPEXY;  Surgeon: Crist Fat, MD;  Location: [facility];  Service: Urology;  Laterality: N/A;   TONSILLECTOMY     TOTAL HIP ARTHROPLASTY Right 11/04/2021   Procedure: RIGHT TOTAL HIP ARTHROPLASTY ANTERIOR APPROACH;  Surgeon: Kathryne Hitch, MD;  Location[facility];  Service: Orthopedics;  Laterality: Right;  Family History  Problem Relation Age of Onset   Emphysema Mother    Lymphoma Mother    Asthma Mother    Cancer Father 78       lung cancer   Coronary artery disease Brother    Cancer Brother        breast cancer   Breast cancer Brother    Cancer Brother        lung cancer   Diabetes Neg Hx    Hypertension Neg Hx    Colon cancer Neg Hx    Esophageal cancer  Neg Hx    Rectal cancer Neg Hx    Stomach cancer Neg Hx    Outpatient Medications Prior to Visit  Medication Sig Dispense Refill   acetaminophen (TYLENOL) 500 MG tablet Take 1-2 tablets (500-1,000 mg total) by mouth at bedtime as needed for moderate pain. 30 tablet 0   albuterol (VENTOLIN HFA) 108 (90 Base) MCG/ACT inhaler Inhale 1-2 puffs into the lungs every 6 (six) hours as needed. 8 g 2   Biotin 29562 MCG TABS Take 10,000 mcg by mouth daily.     buPROPion (WELLBUTRIN XL) 300 MG 24 hr tablet Take 1 tablet (300 mg total) by mouth daily. 90 tablet 1   calcium carbonate (OSCAL) 1500 (600 Ca) MG TABS tablet Take 600 mg of elemental calcium by mouth daily with breakfast.     Carboxymethylcellul-Glycerin (LUBRICATING EYE DROPS OP) Place 1 drop into both eyes daily. Visine     Docusate Calcium (STOOL SOFTENER PO) Take 1 tablet by mouth daily.     fluticasone-salmeterol (ADVAIR DISKUS) 100-50 MCG/ACT AEPB Inhale 1 puff into the lungs 2 (two) times daily. 1 each 5   polyethylene glycol (MIRALAX / GLYCOLAX) 17 g packet Take 17 g by mouth daily. Takes an extra scoop in the evening as needed for moderate-severe constipation.     pyridOXINE (VITAMIN B-6) 100 MG tablet Take 100 mg by mouth daily.     saccharomyces boulardii (FLORASTOR) 250 MG capsule Take 2 capsules (500 mg total) by mouth 2 (two) times daily. (Patient taking differently: Take 250 mg by mouth daily.) 120 capsule 3   triamcinolone (NASACORT ALLERGY 24HR) 55 MCG/ACT AERO nasal inhaler Place 1 spray into the nose daily.     vitamin C (ASCORBIC ACID) 500 MG tablet Take 500 mg by mouth daily.     amLODipine (NORVASC) 5 MG tablet Take 1 tablet (5 mg total) by mouth daily. 90 tablet 3   ezetimibe (ZETIA) 10 MG tablet Take 1 tablet (10 mg total) by mouth daily. Needs appointment for further refills. 90 tablet 0   losartan (COZAAR) 25 MG tablet Take 1 tablet (25 mg total) by mouth daily. 90 tablet 3   Vitamin D, Ergocalciferol, (DRISDOL) 1.25 MG  (50000 UNIT) CAPS capsule Take 1 capsule (50,000 Units total) by mouth every 7 (seven) days. 12 capsule 3   No facility-administered medications prior to visit.   Allergies  Allergen Reactions   Lexapro [Escitalopram] Diarrhea and Other (See Comments)    headache   Ace Inhibitors Cough   Codeine Nausea And Vomiting   Objective:   Today's Vitals   04/09/24 0855  BP: (!) 140/82  Pulse: 78  Temp: 98.2 F (36.8 C)  TempSrc: Temporal  SpO2: 98%  Weight: 150 lb 12.8 oz (68.4 kg)  Height: 5' (1.524 m)   Body mass index is 29.45 kg/m.   General: Well developed, well nourished. No acute distress. Psych: Alert and oriented. Normal  mood and affect.  Health Maintenance Due  Topic Date Due   MAMMOGRAM  03/12/2024     Assessment & Plan:   Problem List Items Addressed This Visit       Cardiovascular and Mediastinum   Essential hypertension - Primary   Blood pressure is mildly increased today. It is possible that losartan could be contributing to the chronic cough issue. I will stop this and have Ms. Crosson increase her amlodipine to 10 mg daily.      Relevant Medications   ezetimibe (ZETIA) 10 MG tablet   amLODipine (NORVASC) 10 MG tablet     Other   Anxiety and depression   Ms. Impastato still feels her mood is not optimally controlled. She should continue bupropion 300 mg daily. I will try adding sertraline 25 mg daily. I recommend she get out several days a week to exercise/walk. I also urged her to attend at least 1 social event per month to improve social interactions.      Relevant Medications   sertraline (ZOLOFT) 25 MG tablet   Dyslipidemia   Continue ezetimibe 10 mg daily.      Relevant Medications   ezetimibe (ZETIA) 10 MG tablet   Upper airway cough syndrome   I will try stopping her losartan, as ARBs can cause cough in ~ 3% of individuals.      Vitamin D insufficiency   In light of fatigue, I will reassess her Vitamin D level. I will reassess her  level today.      Relevant Medications   Vitamin D, Ergocalciferol, (DRISDOL) 1.25 MG (50000 UNIT) CAPS capsule   Other Relevant Orders   VITAMIN D 25 Hydroxy (Vit-D Deficiency, Fractures)   Other Visit Diagnoses       Other fatigue       I will check screenign labs for other issues that could contribute to her fatigue and dperessed mood.   Relevant Orders   CBC   Comprehensive metabolic panel with GFR   TSH   Vitamin B12       Return in about 6 weeks (around 05/21/2024) for Reassessment.   Graig Lawyer, MD

## 2024-04-09 NOTE — Assessment & Plan Note (Signed)
 Continue ezetimibe 10 mg daily

## 2024-04-29 DIAGNOSIS — X32XXXD Exposure to sunlight, subsequent encounter: Secondary | ICD-10-CM | POA: Diagnosis not present

## 2024-04-29 DIAGNOSIS — L821 Other seborrheic keratosis: Secondary | ICD-10-CM | POA: Diagnosis not present

## 2024-04-29 DIAGNOSIS — L570 Actinic keratosis: Secondary | ICD-10-CM | POA: Diagnosis not present

## 2024-05-04 NOTE — Progress Notes (Unsigned)
   Gloria Werner, female    DOB: April 10, 1941,     MRN: [id number]   Brief patient profile:  82yowf never smoker but bronchitis as child in house with smokers self referred to pulmonary clinic 08/16/2021 for second opinion re cough onset May 2019 already eval by Allergy  (Kozlow) then Pulmonary ([name]) refractory to gerd  rx and symbicort  but this may have been while still on ACEi  Onset while on lisinopril  stopped 07/09/2019     History of Present Illness  08/16/2021  Pulmonary/ 1st office eval/[place] No  cough x May 2019  Chief Complaint  Patient presents with   Follow-up    Patient reports has some improvement with productive cough,.   Dyspnea:  Not limited by breathing from desired activities   Cough: pattern mostly day sporadic/worse with voice use, uses mints and cough drops  Sleep: not bothered by cough /mouth wakes up dry p cpap started  SABA use: none  Rec max gerd diet rec 08/16/2021  With f/u Dr Marne Sings at [facility] voice center prn     05/05/2024  f/u ov/Aloha Bartok re: ***   maint on ***  No chief complaint on file.   Dyspnea:  *** Cough: *** Sleeping: *** resp cc  SABA use: *** 02: ***  Lung cancer screening :  ***    No obvious day to day or daytime variability or assoc excess/ purulent sputum or mucus plugs or hemoptysis or cp or chest tightness, subjective wheeze or overt sinus or hb symptoms.    Also denies any obvious fluctuation of symptoms with weather or environmental changes or other aggravating or alleviating factors except as outlined above   No unusual exposure hx or h/o childhood pna/ asthma or knowledge of premature birth.  Current Allergies, Complete Past Medical History, Past Surgical History, Family History, and Social History were reviewed in [employer] record.  ROS  The following are not active complaints unless bolded Hoarseness, sore throat, dysphagia, dental problems, itching, sneezing,  nasal congestion or discharge of excess  mucus or purulent secretions, ear ache,   fever, chills, sweats, unintended wt loss or wt gain, classically pleuritic or exertional cp,  orthopnea pnd or arm/hand swelling  or leg swelling, presyncope, palpitations, abdominal pain, anorexia, nausea, vomiting, diarrhea  or change in bowel habits or change in bladder habits, change in stools or change in urine, dysuria, hematuria,  rash, arthralgias, visual complaints, headache, numbness, weakness or ataxia or problems with walking or coordination,  change in mood or  memory.        No outpatient medications have been marked as taking for the 05/05/24 encounter (Appointment) with Diamond Formica, MD.            Objective:    .wts   05/05/2024        ***   04/09/24 150 lb 12.8 oz (68.4 kg)  02/27/24 153 lb 3.2 oz (69.5 kg)  11/06/23 158 lb 3.2 oz (71.8 kg)      Vital signs reviewed  05/05/2024  - Note at rest 02 sats  ***% on ***   General appearance:    ***    CXR PA and Lateral:   08/16/2021 :    I personally reviewed images / radiology impression as follows:    Mild t kyphosis, no acute findings        Assessment

## 2024-05-05 ENCOUNTER — Ambulatory Visit: Payer: Medicare Other | Admitting: Internal Medicine

## 2024-05-05 ENCOUNTER — Ambulatory Visit (INDEPENDENT_AMBULATORY_CARE_PROVIDER_SITE_OTHER)

## 2024-05-05 ENCOUNTER — Encounter: Payer: Self-pay | Admitting: Internal Medicine

## 2024-05-05 VITALS — BP 136/84 | HR 86 | Temp 99.0°F | Ht 60.0 in | Wt 153.6 lb

## 2024-05-05 DIAGNOSIS — R058 Other specified cough: Secondary | ICD-10-CM | POA: Diagnosis not present

## 2024-05-05 DIAGNOSIS — R059 Cough, unspecified: Secondary | ICD-10-CM | POA: Diagnosis not present

## 2024-05-05 MED ORDER — PREDNISONE 10 MG PO TABS: ORAL_TABLET | ORAL | 0 refills | Status: DC

## 2024-05-05 MED ORDER — FAMOTIDINE 20 MG PO TABS: ORAL_TABLET | ORAL | 11 refills | Status: AC

## 2024-05-05 MED ORDER — GABAPENTIN 100 MG PO CAPS: 100.0000 mg | ORAL_CAPSULE | Freq: Four times a day (QID) | ORAL | 2 refills | Status: DC

## 2024-05-05 MED ORDER — PANTOPRAZOLE SODIUM 40 MG PO TBEC: 40.0000 mg | DELAYED_RELEASE_TABLET | Freq: Every day | ORAL | 2 refills | Status: AC

## 2024-05-05 NOTE — Patient Instructions (Addendum)
 Gabapentin 100 mg one daily at bedtime for a week then add one weekly until up to 4 x daily  Prednisone 10 mg take  4 each am x 2 days,   2 each am x 2 days,  1 each am x 2 days and stop    My office will be contacting you by phone for referral to Dr Marne Sings [facility]   - if you don't hear back from my office within one week please call us  back or notify us  thru MyChart and we'll address it right away.   Pantoprazole  (protonix ) 40 mg   Take  30-60 min before first meal of the day and Pepcid  (famotidine )  20 mg after supper until return to office - this is the best way to tell whether stomach acid is contributing to your problem.    GERD (REFLUX)  is an extremely common cause of respiratory symptoms just like yours , many times with no obvious heartburn at all.    It can be treated with medication, but also with lifestyle changes including elevation of the head of your bed (ideally with 6 -8inch blocks under the headboard of your bed),  Smoking cessation, avoidance of late meals, excessive alcohol, and avoid fatty foods, chocolate, peppermint, colas, red wine, and acidic juices such as orange juice.  NO MINT OR MENTHOL  PRODUCTS SO NO COUGH DROPS  USE SUGARLESS CANDY INSTEAD (Jolley ranchers or Stover's or Life Savers) or even ice chips will also do - the key is to swallow to prevent all throat clearing. NO OIL BASED VITAMINS - use powdered substitutes.  Avoid fish oil when coughing.   Please remember to go to the  x-ray department  for your tests - we will call you with the results when they are available    Follow up here is as needed once you establish with him

## 2024-05-06 NOTE — Assessment & Plan Note (Signed)
 Never smoker - Onset May 2019 while on ACEi d/c'd 07/09/2019 with neg resp to rx for gerd or rhinitis or asthma  - max gerd diet rec 08/16/2021  With f/u Dr Marne Sings at [facility] voice center prn > did not go - 05/05/2024 rec resume max gerd rx and pred x 6 days / gabapentin to 100 mg qid  - 05/05/2024 refer formally to Dr Brent Cambric at [facility] voice center   Of the three most common causes of  Sub-acute / recurrent or chronic cough, only one (GERD)  can actually contribute to/ trigger  the other two (asthma and post nasal drip syndrome)  and perpetuate the cylce of cough.  While not intuitively obvious, many patients with chronic low grade reflux do not cough until there is a primary insult that disturbs the protective epithelial barrier and exposes sensitive nerve endings.   This is typically viral but can due to PNDS and  either may apply here.   The point is that once this occurs, it is difficult to eliminate the cycle  using anything but a maximally effective acid suppression regimen at least in the short run, accompanied by an appropriate diet to address non acid GERD and control / eliminate the cough itself long term with gabapentin up to max of 1200 mg per day if tolerated >>> also added 6 day taper off  Prednisone starting at 40 mg per day in case of component of Th-2 driven upper or lower airways inflammation (if cough responds short term only to relapse before return while will on full rx for uacs (as above), then that would point to allergic rhinitis/ asthma or eos bronchitis as alternative dx)    Discussed in detail all the  indications, usual  risks and alternatives  relative to the benefits with patient who agrees to proceed with Rx as outlined.      F/u can be prn once establishes with Dr Brent Cambric as there is no evidence at all to support a pulmonary source for this cough.     Each maintenance medication was reviewed in detail including emphasizing most importantly the difference between maintenance and  prns and under what circumstances the prns are to be triggered using an action plan format where appropriate.  Total time for H and P, chart review, counseling,   and generating customized AVS unique to this office visit / same day charting = 31 min

## 2024-05-07 ENCOUNTER — Ambulatory Visit: Payer: Self-pay

## 2024-05-13 ENCOUNTER — Telehealth: Payer: Self-pay

## 2024-05-13 NOTE — Telephone Encounter (Signed)
 Copied from CRM [id number]. Topic: Clinical - Lab/Test Results >> May 07, 2024 11:54 AM Juliaette Ober wrote: Reason for CRM: patient calling back to speak with Princella Brooklyn in regards to test results.  Was a result note

## 2024-05-13 NOTE — Telephone Encounter (Signed)
 Copied from CRM [id number]. Topic: Clinical - Lab/Test Results >> May 07, 2024 11:54 AM Juliaette Ober wrote: Reason for CRM: patient calling back to speak with Princella Brooklyn in regards to test results.  Completed patient is aware of results

## 2024-05-26 ENCOUNTER — Encounter: Payer: Self-pay | Admitting: Family Medicine

## 2024-05-26 ENCOUNTER — Ambulatory Visit (INDEPENDENT_AMBULATORY_CARE_PROVIDER_SITE_OTHER): Admitting: Family Medicine

## 2024-05-26 VITALS — BP 134/76 | HR 87 | Temp 97.7°F | Ht 60.0 in | Wt 154.0 lb

## 2024-05-26 DIAGNOSIS — F32A Depression, unspecified: Secondary | ICD-10-CM

## 2024-05-26 DIAGNOSIS — F419 Anxiety disorder, unspecified: Secondary | ICD-10-CM

## 2024-05-26 DIAGNOSIS — I10 Essential (primary) hypertension: Secondary | ICD-10-CM

## 2024-05-26 MED ORDER — SERTRALINE HCL 25 MG PO TABS: 25.0000 mg | ORAL_TABLET | Freq: Every day | ORAL | 3 refills | Status: DC

## 2024-05-26 NOTE — Assessment & Plan Note (Signed)
 Blood pressure is in adequate control. Continue amlodipine  10 mg daily and losartan  25 mg daily.

## 2024-05-26 NOTE — Assessment & Plan Note (Signed)
 Improved. Continue bupropion  300 mg daily and sertraline  25 mg daily. I recommend we continue this for 6 months. If doing well, we would consider stopping the sertraline .

## 2024-05-26 NOTE — Progress Notes (Signed)
 [facility]ARE [facility] [address] [place] [address] Dept: [phone] Dept Fax: [phone]  Chronic Care Office Visit  Subjective:    Patient ID: Gloria Werner, female    DOB: 12/17/1941, 83 y.o..   MRN: [id number]  Chief Complaint  Patient presents with   Hypertension    F/u HTN. No concerns.    History of Present Illness:  Patient is in today for reassessment of chronic medical issues.  Gloria Werner has a history of hypertension. She is managed on amlodipine  5 mg daily and losartan  25 mg daily.    Gloria Werner has a history of anxiety with depression. At her last visit, she noted her depression to be worsening with time. She was seen at [facility] urgent care in late Feb. and followed up with Gloria Werner in March. Gloria Werner increased her bupropion  to 300 mg daily. Gloria Werner felt this has improved her mood over the past 6 weeks, but did not feel she was back to her baseline. We added sertraline  25 mg daily to her regimen. Gloria Werner feels her mood is doing much better at this point. She is not sure that the medicine brought this about, but is pleased to be feeling more herself.  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   DDD (degenerative disc disease), lumbar 09/28/2019   Aortic atherosclerosis (HCC) 09/28/2019   Gastroesophageal reflux disease without esophagitis 02/04/2019   OSA (obstructive sleep apnea) 01/27/2019   Pulmonary nodule 01/27/2019   Allergic rhinitis 11/06/2018   Upper airway cough syndrome 09/24/2018   Primary  osteoarthritis of first carpometacarpal joint of left hand 07/03/2018   Essential hypertension 06/03/2018   Anxiety and depression 01/17/2017   Osteopenia 04/08/2015   Irritable bowel syndrome (IBS) 02/03/2008   Past Surgical History:  Procedure Laterality Date   ABDOMINAL HYSTERECTOMY  1988   secondary to prolapse   BLADDER SUSPENSION     A-P with Hyst   CATARACT EXTRACTION Bilateral    INSERTION OF MESH  10/16/2018   Procedure: INSERTION OF MESH;  Surgeon: Andrez Banker, MD;  Location: [facility];  Service: Urology;;   NASAL SINUS SURGERY  1970   REVERSE SHOULDER ARTHROPLASTY Left 06/08/2022   Procedure: REVERSE SHOULDER ARTHROPLASTY;  Surgeon: Ellard Gunning, MD;  Location: [facility];  Service: Orthopedics;  Laterality: Left;   ROBOTIC ASSISTED LAPAROSCOPIC SACROCOLPOPEXY N/A 10/16/2018   Procedure: XI ROBOTIC ASSISTED LAPAROSCOPIC SACROCOLPOPEXY;  Surgeon: Andrez Banker, MD;  Location: [facility];  Service: Urology;  Laterality: N/A;   TONSILLECTOMY     TOTAL HIP ARTHROPLASTY Right 11/04/2021   Procedure: RIGHT TOTAL HIP ARTHROPLASTY ANTERIOR APPROACH;  Surgeon: Arnie Lao, MD;  Location: [facility];  Service: Orthopedics;  Laterality: Right;   Family History  Problem Relation Age of Onset   Emphysema Mother    Lymphoma Mother    Asthma Mother    Cancer Father 29       lung cancer   Coronary artery disease Brother    Cancer Brother        breast cancer   Breast cancer Brother  Cancer Brother        lung cancer   Diabetes Neg Hx    Hypertension Neg Hx    Colon cancer Neg Hx    Esophageal cancer Neg Hx    Rectal cancer Neg Hx    Stomach cancer Neg Hx    Outpatient Medications Prior to Visit  Medication Sig Dispense Refill   acetaminophen  (TYLENOL ) 500 MG tablet Take 1-2 tablets (500-1,000 mg total) by mouth at bedtime as needed for moderate pain. 30 tablet 0   albuterol  (VENTOLIN  HFA) 108 (90 Base) MCG/ACT inhaler Inhale 1-2 puffs into the lungs every 6  (six) hours as needed. 8 g 2   amLODipine  (NORVASC ) 10 MG tablet Take 0.5 tablets (5 mg total) by mouth daily. 90 tablet 3   Biotin  82505 MCG TABS Take 10,000 mcg by mouth daily.     buPROPion  (WELLBUTRIN  XL) 300 MG 24 hr tablet Take 1 tablet (300 mg total) by mouth daily. 90 tablet 1   calcium  carbonate (OSCAL) 1500 (600 Ca) MG TABS tablet Take 600 mg of elemental calcium  by mouth daily with breakfast.     Carboxymethylcellul-Glycerin (LUBRICATING EYE DROPS OP) Place 1 drop into both eyes daily. Visine     Docusate Calcium  (STOOL SOFTENER PO) Take 1 tablet by mouth daily.     ezetimibe  (ZETIA ) 10 MG tablet Take 1 tablet (10 mg total) by mouth daily. Needs appointment for further refills. 90 tablet 0   famotidine  (PEPCID ) 20 MG tablet One after supper 30 tablet 11   gabapentin  (NEURONTIN ) 100 MG capsule Take 1 capsule (100 mg total) by mouth 4 (four) times daily. 120 capsule 2   latanoprost (XALATAN) 0.005 % ophthalmic solution 1 drop at bedtime.     losartan  (COZAAR ) 25 MG tablet Take 25 mg by mouth daily.     pantoprazole  (PROTONIX ) 40 MG tablet Take 1 tablet (40 mg total) by mouth daily. Take 30-60 min before first meal of the day 30 tablet 2   polyethylene glycol (MIRALAX  / GLYCOLAX ) 17 g packet Take 17 g by mouth daily. Takes an extra scoop in the evening as needed for moderate-severe constipation.     pyridOXINE  (VITAMIN B-6) 100 MG tablet Take 100 mg by mouth daily.     saccharomyces boulardii (FLORASTOR) 250 MG capsule Take 2 capsules (500 mg total) by mouth 2 (two) times daily. (Patient taking differently: Take 250 mg by mouth daily.) 120 capsule 3   triamcinolone  (NASACORT  ALLERGY  24HR) 55 MCG/ACT AERO nasal inhaler Place 1 spray into the nose daily.     vitamin C (ASCORBIC ACID ) 500 MG tablet Take 500 mg by mouth daily.     sertraline  (ZOLOFT ) 25 MG tablet Take 1 tablet (25 mg total) by mouth daily. 30 tablet 3   predniSONE  (DELTASONE ) 10 MG tablet Take  4 each am x 2 days,   2 each am  x 2 days,  1 each am x 2 days and stop 14 tablet 0   No facility-administered medications prior to visit.   Allergies  Allergen Reactions   Lexapro  [Escitalopram ] Diarrhea and Other (See Comments)    headache   Ace Inhibitors Cough   Codeine Nausea And Vomiting   Objective:   Today's Vitals   05/26/24 0907  BP: 134/76  Pulse: 87  Temp: 97.7 F (36.5 C)  TempSrc: Temporal  SpO2: 99%  Weight: 154 lb (69.9 kg)  Height: 5' (1.524 m)   Body mass index is 30.08 kg/m.   General:  Well developed, well nourished. No acute distress. Psych: Alert and oriented. Normal mood and affect.  There are no preventive care reminders to display for this patient.  Lab Results    Latest Ref Rng & Units 04/09/2024    9:55 AM 10/15/2023   12:22 PM 06/29/2023   11:50 AM  CBC  WBC 4.0 - 10.5 K/uL 4.8  5.6  6.2   Hemoglobin 12.0 - 15.0 g/dL 16.1  09.6  04.5   Hematocrit 36.0 - 46.0 % 41.5  39.0  42.8   Platelets 150.0 - 400.0 K/uL 226.0  196  223.0       Latest Ref Rng & Units 04/09/2024    9:55 AM 10/15/2023   12:22 PM 08/21/2023    8:38 AM  CMP  Glucose 70 - 99 mg/dL 409  811  90   BUN 6 - 23 mg/dL 14  20  16    Creatinine 0.40 - 1.20 mg/dL 9.14  7.82  9.56   Sodium 135 - 145 mEq/L 137  136  140   Potassium 3.5 - 5.1 mEq/L 4.3  4.0  4.4   Chloride 96 - 112 mEq/L 103  101  105   CO2 19 - 32 mEq/L 26  22  25    Calcium  8.4 - 10.5 mg/dL 9.3  9.1  9.7   Total Protein 6.0 - 8.3 g/dL 7.0  7.3  6.9   Total Bilirubin 0.2 - 1.2 mg/dL 0.4  0.8  0.3   Alkaline Phos 39 - 117 U/L 57  45  64   AST 0 - 37 U/L 22  25  24    ALT 0 - 35 U/L 17  19  17     Last thyroid  functions Lab Results  Component Value Date   TSH 2.46 04/09/2024   Last vitamin D  Lab Results  Component Value Date   VD25OH 63.11 04/09/2024   Last vitamin B12 and Folate Lab Results  Component Value Date   VITAMINB12 431 04/09/2024   FOLATE 16.9 08/06/2023   Assessment & Plan:   Problem List Items Addressed This Visit        Cardiovascular and Mediastinum   Essential hypertension - Primary   Blood pressure is in adequate control. Continue amlodipine  10 mg daily and losartan  25 mg daily.      Relevant Medications   losartan  (COZAAR ) 25 MG tablet     Other   Anxiety and depression   Improved. Continue bupropion  300 mg daily and sertraline  25 mg daily. I recommend we continue this for 6 months. If doing well, we would consider stopping the sertraline .      Relevant Medications   sertraline  (ZOLOFT ) 25 MG tablet    Return in about 3 months (around 08/26/2024) for Reassessment.   Graig Lawyer, MD

## 2024-07-14 DIAGNOSIS — J385 Laryngeal spasm: Secondary | ICD-10-CM | POA: Diagnosis not present

## 2024-07-14 DIAGNOSIS — R490 Dysphonia: Secondary | ICD-10-CM | POA: Diagnosis not present

## 2024-07-14 DIAGNOSIS — J383 Other diseases of vocal cords: Secondary | ICD-10-CM | POA: Diagnosis not present

## 2024-07-14 DIAGNOSIS — R053 Chronic cough: Secondary | ICD-10-CM | POA: Diagnosis not present

## 2024-08-18 DIAGNOSIS — J385 Laryngeal spasm: Secondary | ICD-10-CM | POA: Diagnosis not present

## 2024-08-18 DIAGNOSIS — R490 Dysphonia: Secondary | ICD-10-CM | POA: Diagnosis not present

## 2024-08-18 DIAGNOSIS — J383 Other diseases of vocal cords: Secondary | ICD-10-CM | POA: Diagnosis not present

## 2024-08-18 DIAGNOSIS — R053 Chronic cough: Secondary | ICD-10-CM | POA: Diagnosis not present

## 2024-08-20 ENCOUNTER — Other Ambulatory Visit: Payer: Self-pay | Admitting: Physician Assistant

## 2024-08-26 ENCOUNTER — Ambulatory Visit (INDEPENDENT_AMBULATORY_CARE_PROVIDER_SITE_OTHER): Admitting: Family Medicine

## 2024-08-26 ENCOUNTER — Encounter: Payer: Self-pay | Admitting: Family Medicine

## 2024-08-26 VITALS — BP 130/76 | HR 78 | Temp 97.0°F | Ht 60.0 in | Wt 154.0 lb

## 2024-08-26 DIAGNOSIS — M545 Low back pain, unspecified: Secondary | ICD-10-CM

## 2024-08-26 DIAGNOSIS — R058 Other specified cough: Secondary | ICD-10-CM

## 2024-08-26 DIAGNOSIS — I10 Essential (primary) hypertension: Secondary | ICD-10-CM

## 2024-08-26 DIAGNOSIS — E785 Hyperlipidemia, unspecified: Secondary | ICD-10-CM

## 2024-08-26 DIAGNOSIS — Z711 Person with feared health complaint in whom no diagnosis is made: Secondary | ICD-10-CM | POA: Diagnosis not present

## 2024-08-26 NOTE — Assessment & Plan Note (Signed)
 Blood pressure is in adequate control. Continue amlodipine  10 mg daily and losartan  25 mg daily.

## 2024-08-26 NOTE — Assessment & Plan Note (Signed)
 Continue ezetimibe  10 mg daily. I will have her return for a fasting lipid profile.

## 2024-08-26 NOTE — Assessment & Plan Note (Signed)
 Stable. She should continue to work with ENT.

## 2024-08-26 NOTE — Progress Notes (Signed)
 [facility] [address] [place] [address] Dept: [phone] Dept Fax: [phone]  Chronic Care Office Visit  Subjective:    Patient ID: Gloria Werner, female    DOB: 07/15/1941, 83 y.o..   MRN: [id number]  Chief Complaint  Patient presents with   Hypertension    3 month f/u HTN, c/o still having cough    History of Present Illness:  Patient is in today for reassessment of chronic medical issues.  Ms. Petre has a history of hypertension. She is managed on amlodipine  5 mg daily and losartan  25 mg daily.  Ms. Sachs has a history of a chronic, daily cough. She was seen by an ENT physician since her last visit. He apparently noted some vocal cord swelling, likely due to cough. He referred Ms. Cohen to speech therapy. She found the speech exercises did reduce her cough for about a week, but then it returned.  Ms. Alberty asks about a soft tissue mass over the distal upper arm, medial to the antecubital fossa. This is nontender. She notes it bilaterally.  Ms. Neglia also mentions an area of right lower back pain, near the right CVA. This has been present for 1-2 weeks. She denies any radiation to the buttocks or legs.  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   DDD (degenerative disc disease), lumbar 09/28/2019   Aortic atherosclerosis (HCC) 09/28/2019   Gastroesophageal reflux disease without esophagitis 02/04/2019   OSA (obstructive sleep apnea) 01/27/2019   Pulmonary nodule 01/27/2019    Allergic rhinitis 11/06/2018   Upper airway cough syndrome 09/24/2018   Primary osteoarthritis of first carpometacarpal joint of left hand 07/03/2018   Essential hypertension 06/03/2018   Anxiety and depression 01/17/2017   Osteopenia 04/08/2015   Irritable bowel syndrome (IBS) 02/03/2008   Past Surgical History:  Procedure Laterality Date   ABDOMINAL HYSTERECTOMY  1988   secondary to prolapse   BLADDER SUSPENSION     A-P with Hyst   CATARACT EXTRACTION Bilateral    EYE SURGERY     INSERTION OF MESH  10/16/2018   Procedure: INSERTION OF MESH;  Surgeon: Cam Morene ORN, MD;  Location: [facility];  Service: Urology;;   NASAL SINUS SURGERY  1970   REVERSE SHOULDER ARTHROPLASTY Left 06/08/2022   Procedure: REVERSE SHOULDER ARTHROPLASTY;  Surgeon: Melita Drivers, MD;  Location: [facility];  Service: Orthopedics;  Laterality: Left;   ROBOTIC ASSISTED LAPAROSCOPIC SACROCOLPOPEXY N/A 10/16/2018   Procedure: XI ROBOTIC ASSISTED LAPAROSCOPIC SACROCOLPOPEXY;  Surgeon: Cam Morene ORN, MD;  Location: [facility];  Service: Urology;  Laterality: N/A;   TONSILLECTOMY     TOTAL HIP ARTHROPLASTY Right 11/04/2021   Procedure: RIGHT TOTAL HIP ARTHROPLASTY ANTERIOR APPROACH;  Surgeon: Vernetta Lonni CINDERELLA, MD;  Location: [facility];  Service: Orthopedics;  Laterality: Right;   Family History  Problem Relation Age of Onset   Emphysema Mother    Lymphoma Mother    Asthma Mother    Cancer Father 84       lung cancer   Coronary artery disease Brother    Cancer Brother  breast cancer   Breast cancer Brother    Cancer Brother        lung cancer   Diabetes Neg Hx    Hypertension Neg Hx    Colon cancer Neg Hx    Esophageal cancer Neg Hx    Rectal cancer Neg Hx    Stomach cancer Neg Hx    Outpatient Medications Prior to Visit  Medication Sig Dispense Refill   acetaminophen  (TYLENOL ) 500 MG tablet Take 1-2 tablets (500-1,000 mg total) by mouth at bedtime as needed for moderate pain. 30 tablet 0    amLODipine  (NORVASC ) 10 MG tablet Take 0.5 tablets (5 mg total) by mouth daily. 90 tablet 3   Biotin  10000 MCG TABS Take 10,000 mcg by mouth daily.     buPROPion  (WELLBUTRIN  XL) 300 MG 24 hr tablet Take 1 tablet (300 mg total) by mouth daily. 90 tablet 1   calcium  carbonate (OSCAL) 1500 (600 Ca) MG TABS tablet Take 600 mg of elemental calcium  by mouth daily with breakfast.     Carboxymethylcellul-Glycerin (LUBRICATING EYE DROPS OP) Place 1 drop into both eyes daily. Visine     Docusate Calcium  (STOOL SOFTENER PO) Take 1 tablet by mouth daily.     ezetimibe  (ZETIA ) 10 MG tablet Take 1 tablet (10 mg total) by mouth daily. Needs appointment for further refills. 90 tablet 0   famotidine  (PEPCID ) 20 MG tablet One after supper 30 tablet 11   latanoprost (XALATAN) 0.005 % ophthalmic solution 1 drop at bedtime.     losartan  (COZAAR ) 25 MG tablet Take 25 mg by mouth daily.     pantoprazole  (PROTONIX ) 40 MG tablet Take 1 tablet (40 mg total) by mouth daily. Take 30-60 min before first meal of the day 30 tablet 2   polyethylene glycol (MIRALAX  / GLYCOLAX ) 17 g packet Take 17 g by mouth daily. Takes an extra scoop in the evening as needed for moderate-severe constipation.     pyridOXINE  (VITAMIN B-6) 100 MG tablet Take 100 mg by mouth daily.     saccharomyces boulardii (FLORASTOR) 250 MG capsule Take 2 capsules (500 mg total) by mouth 2 (two) times daily. 120 capsule 3   sertraline  (ZOLOFT ) 25 MG tablet Take 1 tablet (25 mg total) by mouth daily. 90 tablet 3   triamcinolone  (NASACORT  ALLERGY  24HR) 55 MCG/ACT AERO nasal inhaler Place 1 spray into the nose daily.     vitamin C (ASCORBIC ACID ) 500 MG tablet Take 500 mg by mouth daily.     albuterol  (VENTOLIN  HFA) 108 (90 Base) MCG/ACT inhaler Inhale 1-2 puffs into the lungs every 6 (six) hours as needed. (Patient not taking: Reported on 08/26/2024) 8 g 2   gabapentin  (NEURONTIN ) 100 MG capsule Take 1 capsule (100 mg total) by mouth 4 (four) times daily. (Patient  not taking: Reported on 08/26/2024) 120 capsule 2   No facility-administered medications prior to visit.   Allergies  Allergen Reactions   Lexapro  [Escitalopram ] Diarrhea and Other (See Comments)    headache   Ace Inhibitors Cough   Codeine Nausea And Vomiting   Objective:   Today's Vitals   08/26/24 0859  BP: 130/76  Pulse: 78  Temp: (!) 97 F (36.1 C)  TempSrc: Temporal  SpO2: 99%  Weight: 154 lb (69.9 kg)  Height: 5' (1.524 m)   Body mass index is 30.08 kg/m.   General: Well developed, well nourished. No acute distress. Neck: Supple. No lymphadenopathy. No thyromegaly. Back: Straight. No CVA tenderness bilaterally. Pain indicated  over upper, paralumbar muscle column. No   swelling or increased warmth noted. Extremities: Full ROM of UE. Are of swelling noted over anteromedial aspect fo the UE, at the   antecubital fossae.  Psych: Alert and oriented. Normal mood and affect.  Health Maintenance Due  Topic Date Due   Medicare Annual Wellness (AWV)  10/21/2024     Assessment & Plan:   Problem List Items Addressed This Visit       Cardiovascular and Mediastinum   Essential hypertension - Primary   Blood pressure is in adequate control. Continue amlodipine  10 mg daily and losartan  25 mg daily.        Other   Dyslipidemia   Continue ezetimibe  10 mg daily. I will have her return for a fasting lipid profile.      Relevant Orders   Lipid panel   Upper airway cough syndrome   Stable. She should continue to work with ENT.      Other Visit Diagnoses       Acute right-sided low back pain without sciatica       Appears muscular. Recommend she use heat in the evenigns. Tylenol  as needed for pain.     Feared complaint without diagnosis       The masses above the antecubital foassa are at the locaiton of the brachialis muscles. I do not find a specific mass. Recommend watchful waiting at this point       Return in about 3 months (around 11/25/2024) for  Reassessment.   Garnette CHRISTELLA Simpler, MD

## 2024-08-28 ENCOUNTER — Other Ambulatory Visit: Payer: Self-pay | Admitting: Internal Medicine

## 2024-08-28 DIAGNOSIS — F32A Depression, unspecified: Secondary | ICD-10-CM

## 2024-09-02 ENCOUNTER — Ambulatory Visit: Payer: Self-pay | Admitting: Family Medicine

## 2024-09-02 ENCOUNTER — Other Ambulatory Visit (INDEPENDENT_AMBULATORY_CARE_PROVIDER_SITE_OTHER)

## 2024-09-02 DIAGNOSIS — E785 Hyperlipidemia, unspecified: Secondary | ICD-10-CM

## 2024-09-02 LAB — LIPID PANEL
Cholesterol: 176 mg/dL (ref 0–200)
HDL: 55.5 mg/dL (ref >39.00)
LDL Cholesterol: 99 mg/dL (ref 0–99)
NonHDL: 120.06
Total CHOL/HDL Ratio: 3
Triglycerides: 107 mg/dL (ref 0.0–149.0)
VLDL: 21.4 mg/dL (ref 0.0–40.0)

## 2024-09-16 DIAGNOSIS — M542 Cervicalgia: Secondary | ICD-10-CM | POA: Diagnosis not present

## 2024-09-16 DIAGNOSIS — M5442 Lumbago with sciatica, left side: Secondary | ICD-10-CM | POA: Diagnosis not present

## 2024-09-16 DIAGNOSIS — M9903 Segmental and somatic dysfunction of lumbar region: Secondary | ICD-10-CM | POA: Diagnosis not present

## 2024-09-16 DIAGNOSIS — M9904 Segmental and somatic dysfunction of sacral region: Secondary | ICD-10-CM | POA: Diagnosis not present

## 2024-09-16 DIAGNOSIS — M9901 Segmental and somatic dysfunction of cervical region: Secondary | ICD-10-CM | POA: Diagnosis not present

## 2024-09-23 DIAGNOSIS — M542 Cervicalgia: Secondary | ICD-10-CM | POA: Diagnosis not present

## 2024-09-23 DIAGNOSIS — M9904 Segmental and somatic dysfunction of sacral region: Secondary | ICD-10-CM | POA: Diagnosis not present

## 2024-09-23 DIAGNOSIS — M9901 Segmental and somatic dysfunction of cervical region: Secondary | ICD-10-CM | POA: Diagnosis not present

## 2024-09-23 DIAGNOSIS — M9903 Segmental and somatic dysfunction of lumbar region: Secondary | ICD-10-CM | POA: Diagnosis not present

## 2024-09-23 DIAGNOSIS — M5442 Lumbago with sciatica, left side: Secondary | ICD-10-CM | POA: Diagnosis not present

## 2024-09-29 DIAGNOSIS — M9901 Segmental and somatic dysfunction of cervical region: Secondary | ICD-10-CM | POA: Diagnosis not present

## 2024-09-29 DIAGNOSIS — M5442 Lumbago with sciatica, left side: Secondary | ICD-10-CM | POA: Diagnosis not present

## 2024-09-29 DIAGNOSIS — M542 Cervicalgia: Secondary | ICD-10-CM | POA: Diagnosis not present

## 2024-09-29 DIAGNOSIS — M9903 Segmental and somatic dysfunction of lumbar region: Secondary | ICD-10-CM | POA: Diagnosis not present

## 2024-09-29 DIAGNOSIS — M9904 Segmental and somatic dysfunction of sacral region: Secondary | ICD-10-CM | POA: Diagnosis not present

## 2024-10-06 ENCOUNTER — Other Ambulatory Visit: Payer: Self-pay | Admitting: Family Medicine

## 2024-10-06 DIAGNOSIS — E785 Hyperlipidemia, unspecified: Secondary | ICD-10-CM

## 2024-10-07 DIAGNOSIS — M542 Cervicalgia: Secondary | ICD-10-CM | POA: Diagnosis not present

## 2024-10-07 DIAGNOSIS — M5442 Lumbago with sciatica, left side: Secondary | ICD-10-CM | POA: Diagnosis not present

## 2024-10-07 DIAGNOSIS — M9901 Segmental and somatic dysfunction of cervical region: Secondary | ICD-10-CM | POA: Diagnosis not present

## 2024-10-07 DIAGNOSIS — M9904 Segmental and somatic dysfunction of sacral region: Secondary | ICD-10-CM | POA: Diagnosis not present

## 2024-10-07 DIAGNOSIS — M9903 Segmental and somatic dysfunction of lumbar region: Secondary | ICD-10-CM | POA: Diagnosis not present

## 2024-10-10 DIAGNOSIS — I10 Essential (primary) hypertension: Secondary | ICD-10-CM

## 2024-10-10 NOTE — Progress Notes (Unsigned)
   10/10/2024  Patient ID: Gloria Werner, female   DOB: 01/31/1941, 83 y.o.   MRN: [id number]  This patient is appearing on a report for being at risk of failing the adherence measure for hypertension (ACEi/ARB) medications this calendar year.   Medication: Losartan  Pot 24 mg tablets Last fill date: 05/15/24 for 90 day supply  MyChart message sent to patient.  Naphtali Zywicki C. Naliah Eddington [facility] PharmD Candidate Class of [phone]

## 2024-10-14 DIAGNOSIS — M9901 Segmental and somatic dysfunction of cervical region: Secondary | ICD-10-CM | POA: Diagnosis not present

## 2024-10-14 DIAGNOSIS — M542 Cervicalgia: Secondary | ICD-10-CM | POA: Diagnosis not present

## 2024-10-14 DIAGNOSIS — M9904 Segmental and somatic dysfunction of sacral region: Secondary | ICD-10-CM | POA: Diagnosis not present

## 2024-10-14 DIAGNOSIS — M5442 Lumbago with sciatica, left side: Secondary | ICD-10-CM | POA: Diagnosis not present

## 2024-10-14 DIAGNOSIS — M9903 Segmental and somatic dysfunction of lumbar region: Secondary | ICD-10-CM | POA: Diagnosis not present

## 2024-10-27 ENCOUNTER — Ambulatory Visit: Payer: Medicare Other

## 2024-10-27 DIAGNOSIS — Z0000 Encounter for general adult medical examination without abnormal findings: Secondary | ICD-10-CM | POA: Diagnosis not present

## 2024-10-27 NOTE — Patient Instructions (Signed)
 Ms. Torgeson,  Thank you for taking the time for your Medicare Wellness Visit. I appreciate your continued commitment to your health goals. Please review the care plan we discussed, and feel free to reach out if I can assist you further.  Please note that Annual Wellness Visits do not include a physical exam. Some assessments may be limited, especially if the visit was conducted virtually. If needed, we may recommend an in-person follow-up with your provider.  Ongoing Care Seeing your primary care provider every 3 to 6 months helps us  monitor your health and provide consistent, personalized care.   Referrals If a referral was made during today's visit and you haven't received any updates within two weeks, please contact the referred provider directly to check on the status.  Recommended Screenings:  Health Maintenance  Topic Date Due   Flu Shot  07/25/2024   Medicare Annual Wellness Visit  10/21/2024   Breast Cancer Screening  04/08/2025   DTaP/Tdap/Td vaccine (3 - Td or Tdap) 09/12/2032   Pneumococcal Vaccine for age over 43  Completed   DEXA scan (bone density measurement)  Completed   Zoster (Shingles) Vaccine  Completed   Meningitis B Vaccine  Aged Out   COVID-19 Vaccine  Discontinued   Hepatitis C Screening  Discontinued   Cologuard (Stool DNA test)  Discontinued       10/27/2024    9:53 AM  Advanced Directives  Does Patient Have a Medical Advance Directive? Yes  Type of Estate Agent of [place];Living will  Does patient want to make changes to medical advance directive? No - Patient declined  Copy of Healthcare Power of Attorney in Chart? Yes - validated most recent copy scanned in chart (See row information)    Vision: Annual vision screenings are recommended for early detection of glaucoma, cataracts, and diabetic retinopathy. These exams can also reveal signs of chronic conditions such as diabetes and high blood pressure.  Dental: Annual dental  screenings help detect early signs of oral cancer, gum disease, and other conditions linked to overall health, including heart disease and diabetes.  Please see the attached documents for additional preventive care recommendations.

## 2024-10-27 NOTE — Progress Notes (Addendum)
 Visit Complete: Virtual I connected with this patient by a audio enabled telemedicine application and verified that I am speaking with the correct person using two identifiers.  Patient Location: Home Provider Location: Office/Clinic or Home Office  I discussed the limitations of evaluation and management by telemedicine. The patient expressed understanding and agreed to proceed.  Persons Participating in Visit: Patient   Subjective:   Gloria Werner is a 83 y.o. female who presents for a Medicare Annual Wellness Visit.  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   Past Surgical History:  Procedure Laterality Date   ABDOMINAL HYSTERECTOMY  1988   secondary to prolapse   BLADDER SUSPENSION     A-P with Hyst   CATARACT EXTRACTION Bilateral    EYE SURGERY     INSERTION OF MESH  10/16/2018   Procedure: INSERTION OF MESH;  Surgeon: Cam Morene ORN, MD;  Location: [facility];  Service: Urology;;   NASAL SINUS SURGERY  1970   REVERSE SHOULDER ARTHROPLASTY Left 06/08/2022   Procedure: REVERSE SHOULDER ARTHROPLASTY;  Surgeon: Melita Drivers, MD;  Location: [facility];  Service: Orthopedics;  Laterality: Left;   ROBOTIC ASSISTED LAPAROSCOPIC SACROCOLPOPEXY N/A 10/16/2018   Procedure: XI ROBOTIC ASSISTED LAPAROSCOPIC SACROCOLPOPEXY;  Surgeon: Cam Morene ORN, MD;  Location: [facility];  Service: Urology;  Laterality:  N/A;   TONSILLECTOMY     TOTAL HIP ARTHROPLASTY Right 11/04/2021   Procedure: RIGHT TOTAL HIP ARTHROPLASTY ANTERIOR APPROACH;  Surgeon: Vernetta Lonni CINDERELLA, MD;  Location: [facility];  Service: Orthopedics;  Laterality: Right;   Family History  Problem Relation Age of Onset   Emphysema Mother    Lymphoma Mother    Asthma Mother    Cancer Father 48       lung cancer   Coronary artery disease Brother    Cancer Brother        breast cancer   Breast cancer Brother    Cancer Brother        lung cancer   Diabetes Neg Hx    Hypertension Neg Hx    Colon cancer Neg Hx    Esophageal cancer Neg Hx    Rectal cancer Neg Hx    Stomach cancer Neg Hx    Social History   Occupational History   Occupation: loss adjuster, chartered   Occupation: Retired  Tobacco Use   Smoking status: Never   Smokeless tobacco: Never  Vaping Use   Vaping status: Never Used  Substance and Sexual Activity   Alcohol use: No   Drug use: No   Sexual activity: Yes    Partners: Male    Birth control/protection: Surgical    Comment: hysterectomy   Tobacco Counseling Counseling given: Not Answered  SDOH Screenings   Food Insecurity: No Food Insecurity (10/27/2024)  Housing: Unknown (10/27/2024)  Transportation Needs: No  Transportation Needs (10/27/2024)  Utilities: Not At Risk (10/27/2024)  Alcohol Screen: Low Risk  (10/27/2024)  Depression (PHQ2-9): Low Risk  (10/27/2024)  Recent Concern: Depression (PHQ2-9) - Medium Risk (08/26/2024)  Financial Resource Strain: Low Risk  (10/27/2024)  Physical Activity: Insufficiently Active (10/27/2024)  Social Connections: Moderately Integrated (10/27/2024)  Stress: No Stress Concern Present (10/27/2024)  Tobacco Use: Low Risk  (10/27/2024)  Health Literacy: Adequate Health Literacy (10/27/2024)   Depression Screen    10/27/2024   10:00 AM 08/26/2024    9:07 AM 04/09/2024    9:04 AM 02/27/2024   10:31 AM 10/22/2023    9:49 AM 10/17/2023    9:17 AM 04/26/2023    9:43 AM  PHQ 2/9  Scores  PHQ - 2 Score 0 0 2 1 0 0 3  PHQ- 9 Score 4 6 5   0  10     Goals Addressed             This Visit's Progress    Patient Stated       10/27/2024, working on balance       Visit info / Clinical Intake: Medicare Wellness Visit Type:: Subsequent Annual Wellness Visit Medicare Wellness Visit Mode:: Telephone If telephone:: video error If telephone or video:: unable to obtan vitals due to lack of equipment Interpreter Needed?: No Pre-visit prep was completed: yes AWV questionnaire completed by patient prior to visit?: no Living arrangements:: lives with spouse/significant other Patient's Overall Health Status Rating: good Typical amount of pain: none Does pain affect daily life?: no Are you currently prescribed opioids?: no  Dietary Habits and Nutritional Risks How many meals a day?: 3 Eats fruit and vegetables daily?: yes Most meals are obtained by: preparing own meals; eating out Diabetic:: no  Functional Status Activities of Daily Living (to include ambulation/medication): Independent Ambulation: Independent Medication Administration: Independent Home Management: Independent Manage your own finances?: yes Primary transportation is: driving Concerns about vision?: no *vision screening is required for [facility]* Concerns about hearing?: no  Fall Screening Falls in the past year?: 1 (lost balance mowing, missed step on step stool) Number of falls in past year: 1 Was there an injury with Fall?: 0 Fall Risk Category Calculator: 2 Patient Fall Risk Level: Moderate Fall Risk  Fall Risk Patient at Risk for Falls Due to: History of fall(s); Medication side effect Fall risk Follow up: Falls prevention discussed; Falls evaluation completed  Home and Transportation Safety: All rugs have non-skid backing?: yes All stairs or steps have railings?: yes Grab bars in the bathtub or shower?: yes Have non-skid surface in bathtub or shower?: (!) no Good home lighting?:  yes Regular seat belt use?: yes Hospital stays in the last year:: no  Cognitive Assessment Difficulty concentrating, remembering, or making decisions? : no Will 6CIT or Mini Cog be Completed: yes What year is it?: 0 points What month is it?: 0 points Give patient an address phrase to remember (5 components): [address] [place] About what time is it?: 0 points Count backwards from 20 to 1: 0 points Say the months of the year in reverse: 0 points Repeat the address phrase from earlier: 0 points 6 CIT Score: 0 points  Advance Directives (For Healthcare) Does Patient Have a Medical Advance Directive?: Yes Does patient want to make changes to medical advance directive?: No - Patient declined Type of Advance Directive: Healthcare Power of [place]; Living will Copy of Healthcare Power of Attorney in Chart?: Yes - validated most recent copy scanned in  chart (See row information) Copy of Living Will in Chart?: Yes - validated most recent copy scanned in chart (See row information)  Reviewed/Updated  Reviewed/Updated: All        Objective:    Today's Vitals   There is no height or weight on file to calculate BMI.  Current Medications (verified) Outpatient Encounter Medications as of 10/27/2024  Medication Sig   acetaminophen  (TYLENOL ) 500 MG tablet Take 1-2 tablets (500-1,000 mg total) by mouth at bedtime as needed for moderate pain.   amLODipine  (NORVASC ) 10 MG tablet Take 0.5 tablets (5 mg total) by mouth daily.   Biotin  89999 MCG TABS Take 10,000 mcg by mouth daily.   buPROPion  (WELLBUTRIN  XL) 300 MG 24 hr tablet Take 1 tablet by mouth once daily   calcium  carbonate (OSCAL) 1500 (600 Ca) MG TABS tablet Take 600 mg of elemental calcium  by mouth daily with breakfast.   Carboxymethylcellul-Glycerin (LUBRICATING EYE DROPS OP) Place 1 drop into both eyes daily. [place]   Docusate Calcium  (STOOL SOFTENER PO) Take 1 tablet by mouth daily.   ezetimibe  (ZETIA ) 10 MG tablet TAKE 1  TABLET BY MOUTH ONCE DAILY . APPOINTMENT REQUIRED FOR FUTURE REFILLS   famotidine  (PEPCID ) 20 MG tablet One after supper   latanoprost (XALATAN) 0.005 % ophthalmic solution 1 drop at bedtime.   losartan  (COZAAR ) 25 MG tablet Take 25 mg by mouth daily.   pantoprazole  (PROTONIX ) 40 MG tablet Take 1 tablet (40 mg total) by mouth daily. Take 30-60 min before first meal of the day   polyethylene glycol (MIRALAX  / GLYCOLAX ) 17 g packet Take 17 g by mouth daily. Takes an extra scoop in the evening as needed for moderate-severe constipation.   pyridOXINE  (VITAMIN B-6) 100 MG tablet Take 100 mg by mouth daily.   saccharomyces boulardii (FLORASTOR) 250 MG capsule Take 2 capsules (500 mg total) by mouth 2 (two) times daily.   sertraline  (ZOLOFT ) 25 MG tablet Take 1 tablet (25 mg total) by mouth daily.   triamcinolone  (NASACORT  ALLERGY  24HR) 55 MCG/ACT AERO nasal inhaler Place 1 spray into the nose daily.   vitamin C (ASCORBIC ACID ) 500 MG tablet Take 500 mg by mouth daily.   No facility-administered encounter medications on file as of 10/27/2024.   Hearing/Vision screen Hearing Screening - Comments:: Denies hearing issues Vision Screening - Comments:: Regular eye exams, [facility] Immunizations and Health Maintenance Health Maintenance  Topic Date Due   Influenza Vaccine  07/25/2024   Mammogram  04/08/2025   Medicare Annual Wellness (AWV)  10/27/2025   DTaP/Tdap/Td (3 - Td or Tdap) 09/12/2032   Pneumococcal Vaccine: 50+ Years  Completed   DEXA SCAN  Completed   Zoster Vaccines- Shingrix  Completed   Meningococcal B Vaccine  Aged Out   COVID-19 Vaccine  Discontinued   Hepatitis C Screening  Discontinued   Fecal DNA (Cologuard)  Discontinued        Assessment/Plan:  This is a routine wellness examination for Gloria Werner.  Patient Care Team: Thedora Garnette HERO, MD as PCP - General (Family Medicine) Delford Maude BROCKS, MD as PCP - Cardiology (Cardiology) Abran Norleen SAILOR, MD (Gastroenterology) Darcie Fallow, OD as Referring Physician (Optometry) Nada Macintosh, FNP as Nurse Practitioner (Nurse Practitioner) Darlean Ozell NOVAK, MD as Consulting Physician (Pulmonary Disease) Vernetta Lonni GRADE, MD as Consulting Physician (Orthopedic Surgery) Patel, Donika K, DO as Consulting Physician (Neurology) [facility]  I have personally reviewed and noted the following in the patient's chart:  Medical and social history Use of alcohol, tobacco or illicit drugs  Current medications and supplements including opioid prescriptions. Functional ability and status Nutritional status Physical activity Advanced directives List of other physicians Hospitalizations, surgeries, and ER visits in previous 12 months Vitals Screenings to include cognitive, depression, and falls Referrals and appointments  No orders of the defined types were placed in this encounter.  In addition, I have reviewed and discussed with patient certain preventive protocols, quality metrics, and best practice recommendations. A written personalized care plan for preventive services as well as general preventive health recommendations were provided to patient.   Gloria FORBES Dawn, LPN   88/05/[id number]   Return in 1 year (on 10/27/2025).  After Visit Summary: (MyChart) Due to this being a telephonic visit, the after visit summary with patients personalized plan was offered to patient via MyChart   Nurse Notes: Trouble staying asleep. Due for flu vaccine.

## 2024-11-04 ENCOUNTER — Other Ambulatory Visit: Payer: Self-pay | Admitting: Physician Assistant

## 2024-11-04 DIAGNOSIS — I10 Essential (primary) hypertension: Secondary | ICD-10-CM

## 2024-11-04 MED ORDER — AMLODIPINE BESYLATE 10 MG PO TABS: 5.0000 mg | ORAL_TABLET | Freq: Every day | ORAL | 1 refills | Status: AC

## 2024-11-04 MED ORDER — LOSARTAN POTASSIUM 25 MG PO TABS: 25.0000 mg | ORAL_TABLET | Freq: Every day | ORAL | 1 refills | Status: AC

## 2024-11-04 NOTE — Progress Notes (Signed)
 This patient is appearing on a report for being at risk of failing the adherence measure for hypertension (ACEi/ARB) medications this calendar year.   Medication: losartan  25mg  daily Last fill date: 05/15/24 for 90 day supply  Patient responded to MyChart message stating refills do need to be sent to [facility].  Current prescription is out of refills at the pharmacy.  Patient was seen by Dr. Thedora on 9/2 to follow-up on management of blood pressure.  BP was controlled at that visit (130/76), and patient was instructed to continue losartan  25mg  daily and amlodipine  10mg  daily.  Amlodipine  was last filled 8/12 for a 90 day supply, and no refills remain on this medication either.  Refill orders pending for a 90 day supply with 1 additional refill for both medications for PCP to sign if in agreement.  Patient follows up with Dr. Thedora again 11/18.  Gloria Werner, PharmD, DPLA

## 2024-11-11 ENCOUNTER — Ambulatory Visit: Admitting: Family Medicine

## 2024-11-11 ENCOUNTER — Encounter: Payer: Self-pay | Admitting: Family Medicine

## 2024-11-11 VITALS — BP 130/78 | HR 76 | Temp 98.4°F | Ht 60.0 in | Wt 154.8 lb

## 2024-11-11 DIAGNOSIS — G4700 Insomnia, unspecified: Secondary | ICD-10-CM | POA: Diagnosis not present

## 2024-11-11 DIAGNOSIS — F419 Anxiety disorder, unspecified: Secondary | ICD-10-CM | POA: Diagnosis not present

## 2024-11-11 DIAGNOSIS — F32A Depression, unspecified: Secondary | ICD-10-CM | POA: Diagnosis not present

## 2024-11-11 NOTE — Assessment & Plan Note (Signed)
 We discussed that both of her psych meds can commonly cause insomnia. As the sertralien was the last added and we already had plans to taper and stop this, I will go ahead with that plan. I did review issues of sleep hygiene.

## 2024-11-11 NOTE — Patient Instructions (Signed)
 Reduce dose of sertraline  to 1/2 tablet (12.5 mg) for 7 days, then stop.  Insomnia Insomnia is a sleep disorder that makes it difficult to fall asleep or stay asleep. Insomnia can cause fatigue, low energy, difficulty concentrating, mood swings, and poor performance at work or school. There are three different ways to classify insomnia: Difficulty falling asleep. Difficulty staying asleep. Waking up too early in the morning. Any type of insomnia can be long-term (chronic) or short-term (acute). Both are common. Short-term insomnia usually lasts for 3 months or less. Chronic insomnia occurs at least three times a week for longer than 3 months. What are the causes? Insomnia may be caused by another condition, situation, or substance, such as: Having certain mental health conditions, such as anxiety and depression. Using caffeine, alcohol, tobacco, or drugs. Having gastrointestinal conditions, such as gastroesophageal reflux disease (GERD). Having certain medical conditions. These include: Asthma. Alzheimer's disease. Stroke. Chronic pain. An overactive thyroid  gland (hyperthyroidism). Other sleep disorders, such as restless legs syndrome and sleep apnea. Menopause. Sometimes, the cause of insomnia may not be known. What increases the risk? Risk factors for insomnia include: Gender. Females are affected more often than males. Age. Insomnia is more common as people get older. Stress and certain medical and mental health conditions. Lack of exercise. Having an irregular work schedule. This may include working night shifts and traveling between different time zones. What are the signs or symptoms? If you have insomnia, the main symptom is having trouble falling asleep or having trouble staying asleep. This may lead to other symptoms, such as: Feeling tired or having low energy. Feeling nervous about going to sleep. Not feeling rested in the morning. Having trouble concentrating. Feeling  irritable, anxious, or depressed. How is this diagnosed? This condition may be diagnosed based on: Your symptoms and medical history. Your health care provider may ask about: Your sleep habits. Any medical conditions you have. Your mental health. A physical exam. How is this treated? Treatment for insomnia depends on the cause. Treatment may focus on treating an underlying condition that is causing the insomnia. Treatment may also include: Medicines to help you sleep. Counseling or therapy. Lifestyle adjustments to help you sleep better. Follow these instructions at home: Eating and drinking  Limit or avoid alcohol, caffeinated beverages, and products that contain nicotine and tobacco, especially close to bedtime. These can disrupt your sleep. Do not eat a large meal or eat spicy foods right before bedtime. This can lead to digestive discomfort that can make it hard for you to sleep. Sleep habits  Keep a sleep diary to help you and your health care provider figure out what could be causing your insomnia. Write down: When you sleep. When you wake up during the night. How well you sleep and how rested you feel the next day. Any side effects of medicines you are taking. What you eat and drink. Make your bedroom a dark, comfortable place where it is easy to fall asleep. Put up shades or blackout curtains to block light from outside. Use a white noise machine to block noise. Keep the temperature cool. Limit screen use before bedtime. This includes: Not watching TV. Not using your smartphone, tablet, or computer. Stick to a routine that includes going to bed and waking up at the same times every day and night. This can help you fall asleep faster. Consider making a quiet activity, such as reading, part of your nighttime routine. Try to avoid taking naps during the day so that  you sleep better at night. Get out of bed if you are still awake after 15 minutes of trying to sleep. Keep the  lights down, but try reading or doing a quiet activity. When you feel sleepy, go back to bed. General instructions Take over-the-counter and prescription medicines only as told by your health care provider. Exercise regularly as told by your health care provider. However, avoid exercising in the hours right before bedtime. Use relaxation techniques to manage stress. Ask your health care provider to suggest some techniques that may work well for you. These may include: Breathing exercises. Routines to release muscle tension. Visualizing peaceful scenes. Make sure that you drive carefully. Do not drive if you feel very sleepy. Keep all follow-up visits. This is important. Contact a health care provider if: You are tired throughout the day. You have trouble in your daily routine due to sleepiness. You continue to have sleep problems, or your sleep problems get worse. Get help right away if: You have thoughts about hurting yourself or someone else. Get help right away if you feel like you may hurt yourself or others, or have thoughts about taking your own life. Go to your nearest emergency room or: Call 911. Call the National Suicide Prevention Lifeline at 812-174-3221 or 988. This is open 24 hours a day. Text the Crisis Text Line at 979-841-5109. Summary Insomnia is a sleep disorder that makes it difficult to fall asleep or stay asleep. Insomnia can be long-term (chronic) or short-term (acute). Treatment for insomnia depends on the cause. Treatment may focus on treating an underlying condition that is causing the insomnia. Keep a sleep diary to help you and your health care provider figure out what could be causing your insomnia. This information is not intended to replace advice given to you by your health care provider. Make sure you discuss any questions you have with your health care provider. Document Revised: 11/21/2021 Document Reviewed: 11/21/2021 Elsevier Patient Education  2024  [employer].

## 2024-11-11 NOTE — Assessment & Plan Note (Signed)
 Improved. Continue bupropion  300 mg daily. We had previously planned to try and stop the sertraline  this month (after 6 months of therapy). As this could be the cause of her insomnia, we will move ahead with this plan.

## 2024-11-11 NOTE — Progress Notes (Signed)
 [facility] [address] [place] [address] Dept: [phone] Dept Fax: [phone]  Office Visit  Subjective:    Patient ID: Gloria Werner, female    DOB: 1941/11/18, 83 y.o..   MRN: [id number]  Chief Complaint  Patient presents with   Hypertension    F/u HTN and also having some sleeping issues.  Has tried Melatonin.    History of Present Illness:  Patient is in today complaining of sleeping issues. Gloria Werner has been having difficulty with sleep over the past weeks. She had tried using melatonin, but can't tell that this was very helpful. She does not feel she is under any stressors at this point. She has been on bupropion  300 mg daily and sertraline  25 mg daily for anxiety and depression. We had added the sertraline  6 months ago.  Her mood ahs been improved on this combination.  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   DDD (degenerative disc disease), lumbar 09/28/2019   Aortic atherosclerosis 09/28/2019   Gastroesophageal reflux disease without esophagitis 02/04/2019   OSA (obstructive sleep apnea) 01/27/2019   Pulmonary nodule 01/27/2019   Allergic rhinitis 11/06/2018   Upper airway cough syndrome 09/24/2018   Primary osteoarthritis of first carpometacarpal joint of left hand 07/03/2018   Essential hypertension 06/03/2018   Anxiety and depression 01/17/2017   Osteopenia 04/08/2015   Irritable bowel syndrome (IBS) 02/03/2008   Past Surgical History:  Procedure Laterality Date   ABDOMINAL  HYSTERECTOMY  1988   secondary to prolapse   BLADDER SUSPENSION     A-P with Hyst   CATARACT EXTRACTION Bilateral    EYE SURGERY     INSERTION OF MESH  10/16/2018   Procedure: INSERTION OF MESH;  Surgeon: Cam Morene ORN, MD;  Location: [facility];  Service: Urology;;   NASAL SINUS SURGERY  1970   REVERSE SHOULDER ARTHROPLASTY Left 06/08/2022   Procedure: REVERSE SHOULDER ARTHROPLASTY;  Surgeon: Melita Drivers, MD;  Location: [facility];  Service: Orthopedics;  Laterality: Left;   ROBOTIC ASSISTED LAPAROSCOPIC SACROCOLPOPEXY N/A 10/16/2018   Procedure: XI ROBOTIC ASSISTED LAPAROSCOPIC SACROCOLPOPEXY;  Surgeon: Cam Morene ORN, MD;  Location: [facility];  Service: Urology;  Laterality: N/A;   TONSILLECTOMY     TOTAL HIP ARTHROPLASTY Right 11/04/2021   Procedure: RIGHT TOTAL HIP ARTHROPLASTY ANTERIOR APPROACH;  Surgeon: Vernetta Lonni CINDERELLA, MD;  Location: [facility];  Service: Orthopedics;  Laterality: Right;   Family History  Problem Relation Age of Onset   Emphysema Mother    Lymphoma Mother    Asthma Mother    Cancer Father 17       lung cancer   Coronary artery disease Brother    Cancer Brother        breast cancer   Breast cancer Brother    Cancer Brother        lung cancer   Diabetes Neg Hx    Hypertension Neg Hx    Colon cancer Neg Hx    Esophageal cancer Neg Hx    Rectal cancer Neg Hx    Stomach cancer Neg Hx  Outpatient Medications Prior to Visit  Medication Sig Dispense Refill   acetaminophen  (TYLENOL ) 500 MG tablet Take 1-2 tablets (500-1,000 mg total) by mouth at bedtime as needed for moderate pain. 30 tablet 0   amLODipine  (NORVASC ) 10 MG tablet Take 0.5 tablets (5 mg total) by mouth daily. 90 tablet 1   Biotin  89999 MCG TABS Take 10,000 mcg by mouth daily.     buPROPion  (WELLBUTRIN  XL) 300 MG 24 hr tablet Take 1 tablet by mouth once daily 90 tablet 4   calcium  carbonate (OSCAL) 1500 (600 Ca) MG TABS tablet Take 600 mg of elemental calcium  by mouth daily with  breakfast.     Carboxymethylcellul-Glycerin (LUBRICATING EYE DROPS OP) Place 1 drop into both eyes daily. Visine     Docusate Calcium  (STOOL SOFTENER PO) Take 1 tablet by mouth daily.     ezetimibe  (ZETIA ) 10 MG tablet TAKE 1 TABLET BY MOUTH ONCE DAILY . APPOINTMENT REQUIRED FOR FUTURE REFILLS 90 tablet 3   famotidine  (PEPCID ) 20 MG tablet One after supper 30 tablet 11   latanoprost (XALATAN) 0.005 % ophthalmic solution 1 drop at bedtime.     losartan  (COZAAR ) 25 MG tablet Take 1 tablet (25 mg total) by mouth daily. 90 tablet 1   pantoprazole  (PROTONIX ) 40 MG tablet Take 1 tablet (40 mg total) by mouth daily. Take 30-60 min before first meal of the day 30 tablet 2   polyethylene glycol (MIRALAX  / GLYCOLAX ) 17 g packet Take 17 g by mouth daily. Takes an extra scoop in the evening as needed for moderate-severe constipation.     pyridOXINE  (VITAMIN B-6) 100 MG tablet Take 100 mg by mouth daily.     saccharomyces boulardii (FLORASTOR) 250 MG capsule Take 2 capsules (500 mg total) by mouth 2 (two) times daily. 120 capsule 3   sertraline  (ZOLOFT ) 25 MG tablet Take 1 tablet (25 mg total) by mouth daily. 90 tablet 3   triamcinolone  (NASACORT  ALLERGY  24HR) 55 MCG/ACT AERO nasal inhaler Place 1 spray into the nose daily.     vitamin C (ASCORBIC ACID ) 500 MG tablet Take 500 mg by mouth daily.     No facility-administered medications prior to visit.   Allergies  Allergen Reactions   Lexapro  [Escitalopram ] Diarrhea and Other (See Comments)    headache   Ace Inhibitors Cough   Codeine Nausea And Vomiting     Objective:   Today's Vitals   11/11/24 1316  BP: 130/78  Pulse: 76  Temp: 98.4 F (36.9 C)  TempSrc: Temporal  SpO2: 98%  Weight: 154 lb 12.8 oz (70.2 kg)  Height: 5' (1.524 m)   Body mass index is 30.23 kg/m.   General: Well developed, well nourished. No acute distress. Psych: Alert and oriented. Normal mood and affect.  Health Maintenance Due  Topic Date Due   Influenza Vaccine   07/25/2024     Assessment & Plan:   Problem List Items Addressed This Visit       Other   Anxiety and depression   Improved. Continue bupropion  300 mg daily. We had previously planned to try and stop the sertraline  this month (after 6 months of therapy). As this could be the cause of her insomnia, we will move ahead with this plan.       Insomnia - Primary   We discussed that both of her psych meds can commonly cause insomnia. As the sertralien was the last added and we already had plans to taper and stop this, I will  go ahead with that plan. I did review issues of sleep hygiene.       Return in about 3 months (around 02/11/2025) for Reassessment.    Garnette CHRISTELLA Simpler, MD  I,Emily Lagle,acting as a scribe for Garnette CHRISTELLA Simpler, MD.,have documented all relevant documentation on the behalf of Garnette CHRISTELLA Simpler, MD.  I, Garnette CHRISTELLA Simpler, MD, have reviewed all documentation for this visit. The documentation on 11/11/2024 for the exam, diagnosis, procedures, and orders are all accurate and complete.

## 2024-11-25 ENCOUNTER — Ambulatory Visit: Admitting: Family Medicine

## 2024-12-03 NOTE — Progress Notes (Signed)
 Gloria Console, PA-C [address] [place], [place]  [address] Phone: [phone]   Primary Care Physician: Thedora Garnette HERO, MD  Primary Gastroenterologist:  Gloria Console, PA-C / Norleen Kiang, MD   Chief Complaint: Follow-up abdominal pain and bowel problems       HPI:   Discussed the use of AI scribe software for clinical note transcription with the patient, who gave verbal consent to proceed. History of Present Illness Gloria Werner is an 83 year old female with severe diverticulosis who presents with chronic constipation and upper abdominal pain. She is accompanied by her husband.  Established patient of Dr. Kiang.  Last seen in our office in 2023 for generalized abdominal pain.  Has history of chronic constipation.  Tried Linzess  which caused diarrhea.  MiraLAX  once daily is not working well.  Altered bowel habits - Frequent constipation lasting three to four days, followed by episodes of diarrhea and incontinence - Uses Miralax  without significant relief - Has not tried other treatments or fiber supplements - No blood in stool - No unusual weight loss  Abdominal pain - Excruciating pain under the ribs, located in both right and left upper quadrants - Pain occurs with bending over or sitting, not constant - CT scan from October 2024 showed diverticulosis. No other abnormality. 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 - Prescribed pantoprazole  40 mg but stopped it as it was not helping her cough.  09/2023 CT abdomen pelvis with contrast (eval  left flank pain and constipation): 1. No acute abnormality. No evidence of bowel obstruction or acute bowel inflammation. Marked left colonic diverticulosis, with no evidence of acute diverticulitis. 2.  Aortic Atherosclerosis  01/2020 Last colonoscopy: showed severe diverticulosis throughout the colon.  No polyps.  No repeat due to advanced age.    Current Outpatient Medications  Medication Sig Dispense Refill   acetaminophen  (TYLENOL ) 500 MG tablet Take 1-2 tablets (500-1,000 mg total) by mouth at bedtime as needed for moderate pain. 30 tablet 0   amLODipine  (NORVASC ) 10 MG tablet Take 0.5 tablets (5 mg total) by mouth daily. 90 tablet 1   Biotin  10000 MCG TABS Take 10,000 mcg by mouth daily.     buPROPion  (WELLBUTRIN  XL) 300 MG 24 hr tablet Take 1 tablet by mouth once daily 90 tablet 4   calcium  carbonate (OSCAL) 1500 (600 Ca) MG TABS tablet Take 600 mg of elemental calcium  by mouth daily with breakfast.     Carboxymethylcellul-Glycerin (LUBRICATING EYE DROPS OP) Place 1 drop into both eyes daily. Visine     Docusate Calcium  (STOOL SOFTENER PO) Take 1 tablet by mouth daily.     ezetimibe  (ZETIA ) 10 MG tablet TAKE 1 TABLET BY MOUTH ONCE DAILY . APPOINTMENT REQUIRED FOR FUTURE REFILLS 90 tablet 3   famotidine  (PEPCID ) 20 MG tablet One after supper 30 tablet 11   latanoprost (XALATAN) 0.005 % ophthalmic solution 1 drop at bedtime.     losartan  (COZAAR ) 25 MG tablet Take 1 tablet (25 mg total) by mouth daily. 90 tablet 1   lubiprostone (AMITIZA) 8 MCG capsule Take 1 capsule (8 mcg  total) by mouth 2 (two) times daily with a meal. 60 capsule 5   pantoprazole  (PROTONIX ) 40 MG tablet Take 1 tablet (40 mg total) by mouth daily. Take 30-60 min before first meal of the day 30 tablet 2   polyethylene glycol (MIRALAX  / GLYCOLAX ) 17 g packet Take 17 g by mouth daily. Takes an extra scoop in the evening as needed for moderate-severe constipation.     pyridOXINE  (VITAMIN B-6) 100 MG tablet Take 100 mg by  mouth daily.     saccharomyces boulardii (FLORASTOR) 250 MG capsule Take 2 capsules (500 mg total) by mouth 2 (two) times daily. 120 capsule 3   triamcinolone  (NASACORT  ALLERGY  24HR) 55 MCG/ACT AERO nasal inhaler Place 1 spray into the nose daily.     vitamin C (ASCORBIC ACID ) 500 MG tablet Take 500 mg by mouth daily.     No current facility-administered medications for this visit.    Allergies as of 12/04/2024 - Review Complete 12/04/2024  Allergen Reaction Noted   Lexapro  [escitalopram ] Diarrhea and Other (See Comments) 04/08/2015   Ace inhibitors Cough 11/06/2018   Codeine Nausea And Vomiting 10/31/2010    Past Medical History:  Diagnosis Date   Allergic rhinitis due to pollen    Allergy     Anxiety    Cancer (HCC)    skin   Cataract    NS OU   Complication of anesthesia    Coronary artery disease    Cough    De Quervain's syndrome (tenosynovitis) 07/03/2018   Gastroesophageal reflux disease without esophagitis    Heart murmur    Hypertension    Irritable bowel syndrome    Lichen simplex chronicus 09/2004   PONV (postoperative nausea and vomiting)    Sleep apnea    Unilateral primary osteoarthritis, 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    Past Surgical History:  Procedure Laterality Date   ABDOMINAL HYSTERECTOMY  1988   secondary to prolapse   BLADDER SUSPENSION     A-P with Hyst   CATARACT EXTRACTION Bilateral    EYE SURGERY     INSERTION OF MESH  10/16/2018   Procedure: INSERTION OF MESH;  Surgeon: Cam Morene ORN, MD;  Location: [facility];  Service: Urology;;   NASAL SINUS SURGERY  1970   REVERSE SHOULDER ARTHROPLASTY Left 06/08/2022   Procedure: REVERSE SHOULDER ARTHROPLASTY;  Surgeon: Melita Drivers, MD;  Location: [facility];  Service: Orthopedics;  Laterality: Left;   ROBOTIC ASSISTED LAPAROSCOPIC SACROCOLPOPEXY N/A 10/16/2018   Procedure: XI ROBOTIC ASSISTED LAPAROSCOPIC SACROCOLPOPEXY;  Surgeon: Cam Morene ORN, MD;  Location: [facility];  Service: Urology;  Laterality:  N/A;   TONSILLECTOMY     TOTAL HIP ARTHROPLASTY Right 11/04/2021   Procedure: RIGHT TOTAL HIP ARTHROPLASTY ANTERIOR APPROACH;  Surgeon: Vernetta Lonni GRADE, MD;  Location: [facility];  Service: Orthopedics;  Laterality: Right;    Review of Systems:    All systems reviewed and negative except where noted in HPI.    Physical Exam:  BP 138/82   Pulse 85   Ht 5' (1.524 m)   Wt 153 lb (69.4 kg)   LMP 12/25/1986   BMI 29.88 kg/m  Patient's last menstrual period was 12/25/1986.  General: Well-nourished, well-developed in no acute distress.  Lungs: Clear to auscultation bilaterally. Non-labored. Heart: Regular rate and rhythm, no rubs or gallops.  Grade 2 or 3 systolic murmur. Abdomen: Bowel sounds are normal; Abdomen is Soft; No hepatosplenomegaly, masses or hernias;  No Abdominal Tenderness; No guarding or rebound tenderness. Neuro: Alert and  oriented x 3.  Grossly intact.  Psych: Alert and cooperative, normal mood and affect.   Imaging Studies: No results found.  Labs: CBC    Component Value Date/Time   WBC 4.8 04/09/2024 0955   RBC 4.61 04/09/2024 0955   HGB 13.7 04/09/2024 0955   HCT 41.5 04/09/2024 0955   PLT 226.0 04/09/2024 0955   MCV 90.1 04/09/2024 0955   MCH 29.7 10/15/2023 1222   MCHC 33.0 04/09/2024 0955   RDW 14.1 04/09/2024 0955   LYMPHSABS 1.7 10/15/2023 1222   MONOABS 0.6 10/15/2023 1222   EOSABS 0.1 10/15/2023 1222   BASOSABS 0.0 10/15/2023 1222    CMP     Component Value Date/Time   NA 137 04/09/2024 0955   NA 140 08/21/2023 0838   K 4.3 04/09/2024 0955   CL 103 04/09/2024 0955   CO2 26 04/09/2024 0955   GLUCOSE 104 (H) 04/09/2024 0955   BUN 14 04/09/2024 0955   BUN 16 08/21/2023 0838   CREATININE 0.99 04/09/2024 0955   CALCIUM  9.3 04/09/2024 0955   PROT 7.0 04/09/2024 0955   PROT 6.9 08/21/2023 0838   ALBUMIN 4.6 04/09/2024 0955   ALBUMIN 4.5 08/21/2023 0838   AST 22 04/09/2024 0955   ALT 17 04/09/2024 0955   ALKPHOS 57 04/09/2024 0955    BILITOT 0.4 04/09/2024 0955   BILITOT 0.3 08/21/2023 0838   GFRNONAA 57 (L) 10/15/2023 1222   GFRAA >60 10/17/2018 0514       Assessment and Plan:   DEEM MARMOL is a 83 y.o. y/o female presents for: Assessment & Plan Chronic constipation and diverticulosis Chronic constipation with impaction likely due to slow colon transit. Diverticulosis confirmed by CT and colonoscopy.  No evidence of diverticulitis.  Miralax  ineffective, causing diarrhea and incontinence. - Discontinued Miralax . - Initiated Amitiza 8 mcg twice daily with food. - Instructed to monitor for diarrhea and report. - Initiated Metamucil once daily. - Encouraged fluid intake of 64 ounces daily. - Encouraged dietary modifications including prunes, prune juice, fresh fruits, vegetables, and high-fiber cereals.  Upper abdominal pain, likely musculoskeletal Intermittent pain likely due to rib cage pressure, exacerbated by bending, absent at rest, likely musculoskeletal.  Gastroesophageal reflux disease Occasional reflux with acidic foods. Previous pantoprazole  trial ineffective due to short duration. 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 - Restart pantoprazole  40 mg once daily in the morning consistently for three months. - Monitor for improvement in cough and reflux symptoms.  Dysphagia Occasional sensation of food not passing, especially with acidic foods. No weight loss or alarming symptoms. - Ordered upper GI series with barium tablet to evaluate esophagus and stomach.   Gloria Console, PA-C  Follow up 2 to 3 months with TG.

## 2024-12-04 ENCOUNTER — Ambulatory Visit: Admitting: Physician Assistant

## 2024-12-04 ENCOUNTER — Encounter: Payer: Self-pay | Admitting: Physician Assistant

## 2024-12-04 VITALS — BP 138/82 | HR 85 | Ht 60.0 in | Wt 153.0 lb

## 2024-12-04 DIAGNOSIS — R1010 Upper abdominal pain, unspecified: Secondary | ICD-10-CM

## 2024-12-04 DIAGNOSIS — R6881 Early satiety: Secondary | ICD-10-CM

## 2024-12-04 DIAGNOSIS — K219 Gastro-esophageal reflux disease without esophagitis: Secondary | ICD-10-CM

## 2024-12-04 DIAGNOSIS — K5909 Other constipation: Secondary | ICD-10-CM | POA: Diagnosis not present

## 2024-12-04 DIAGNOSIS — K5790 Diverticulosis of intestine, part unspecified, without perforation or abscess without bleeding: Secondary | ICD-10-CM | POA: Diagnosis not present

## 2024-12-04 DIAGNOSIS — R1310 Dysphagia, unspecified: Secondary | ICD-10-CM | POA: Diagnosis not present

## 2024-12-04 DIAGNOSIS — K581 Irritable bowel syndrome with constipation: Secondary | ICD-10-CM

## 2024-12-04 MED ORDER — LUBIPROSTONE 8 MCG PO CAPS: 8.0000 ug | ORAL_CAPSULE | Freq: Two times a day (BID) | ORAL | 5 refills | Status: AC

## 2024-12-04 NOTE — Patient Instructions (Signed)
 Stop Miralax .    Start Metamucil (Psyllium Husk) Fiber Powder: Metamucil powders: Put 1-2 rounded spoons in an empty glass once daily. If you're taking Metamucil Sugar-Free Powder or Premium Blend, use a teaspoon. If you're taking Metamucil with Real Sugar, use a tablespoon. Mix briskly with 8 oz. or more of cool liquid. Drink promptly, and enjoy! Drink 64 ounces of water  or other liquids daily.   START Prescription Amitiza (Lubiprostone) 8mcg 1 tablet twice daily With food for Constipation.  You have been scheduled for an Upper GI Series at [facility] (Main Entrance/ Entrance A). Your appointment is on 12/11/24 at 9:00 am. Please arrive 30 minutes prior to your test for registration. Make sure not to eat or drink anything after midnight on the night before your test. If you need to reschedule, please call radiology at [phone]. ________________________________________________________________ An upper GI series uses x rays to help diagnose problems of the upper GI tract, which includes the esophagus, stomach, and duodenum. The duodenum is the first part of the small intestine. An upper GI series is conducted by a radiology technologist or a radiologist--a doctor who specializes in x-ray imaging--at a hospital or outpatient center. While sitting or standing in front of an x-ray machine, the patient drinks barium liquid, which is often white and has a chalky consistency and taste. The barium liquid coats the lining of the upper GI tract and makes signs of disease show up more clearly on x rays. X-ray video, called fluoroscopy, is used to view the barium liquid moving through the esophagus, stomach, and duodenum. Additional x rays and fluoroscopy are performed while the patient lies on an x-ray table. To fully coat the upper GI tract with barium liquid, the technologist or radiologist may press on the abdomen or ask the patient to change position. Patients hold still in various positions, allowing  the technologist or radiologist to take x rays of the upper GI tract at different angles. If a technologist conducts the upper GI series, a radiologist will later examine the images to look for problems.  This test typically takes about 1 hour to complete. __________________________________________________________________ Please follow up sooner if symptoms increase or worsen  Due to recent changes in healthcare laws, you may see the results of your imaging and laboratory studies on MyChart before your provider has had a chance to review them.  We understand that in some cases there may be results that are confusing or concerning to you. Not all laboratory results come back in the same time frame and the provider may be waiting for multiple results in order to interpret others.  Please give us  48 hours in order for your provider to thoroughly review all the results before contacting the office for clarification of your results.   Thank you for trusting me with your gastrointestinal care!   Ellouise Console, PA-C _______________________________________________________  If your blood pressure at your visit was 140/90 or greater, please contact your primary care physician to follow up on this.  _______________________________________________________  If you are age 90 or older, your body mass index should be between 23-30. Your Body mass index is 29.88 kg/m. If this is out of the aforementioned range listed, please consider follow up with your Primary Care Provider.  If you are age 73 or younger, your body mass index should be between 19-25. Your Body mass index is 29.88 kg/m. If this is out of the aformentioned range listed, please consider follow up with your Primary Care Provider.   ________________________________________________________  The [facility] providers would like to encourage you to use MYCHART to communicate with providers for non-urgent requests or questions.  Due to long hold  times on the telephone, sending your provider a message by [facility] may be a faster and more efficient way to get a response.  Please allow 48 business hours for a response.  Please remember that this is for non-urgent requests.  _______________________________________________________

## 2024-12-11 ENCOUNTER — Ambulatory Visit: Payer: Self-pay | Admitting: Physician Assistant

## 2024-12-11 ENCOUNTER — Ambulatory Visit (HOSPITAL_COMMUNITY)
Admission: RE | Admit: 2024-12-11 | Discharge: 2024-12-11 | Attending: Physician Assistant | Admitting: Physician Assistant

## 2024-12-11 DIAGNOSIS — R1310 Dysphagia, unspecified: Secondary | ICD-10-CM | POA: Insufficient documentation

## 2024-12-11 DIAGNOSIS — K219 Gastro-esophageal reflux disease without esophagitis: Secondary | ICD-10-CM | POA: Diagnosis present

## 2024-12-11 DIAGNOSIS — R6881 Early satiety: Secondary | ICD-10-CM | POA: Insufficient documentation

## 2025-02-11 ENCOUNTER — Ambulatory Visit: Admitting: Family Medicine

## 2025-02-16 ENCOUNTER — Ambulatory Visit: Admitting: Cardiovascular Disease

## 2025-02-19 ENCOUNTER — Ambulatory Visit: Admitting: Physician Assistant

## 2025-11-02 ENCOUNTER — Ambulatory Visit
